# Patient Record
Sex: Male | Born: 1945 | Race: White | Hispanic: No | Marital: Married | Smoking: Heavy tobacco smoker
Health system: Southern US, Community
[De-identification: ages and names within clinical notes are randomized; demographics above are authoritative.]

## PROBLEM LIST (undated history)

## (undated) DIAGNOSIS — E785 Hyperlipidemia, unspecified: Secondary | ICD-10-CM

## (undated) DIAGNOSIS — I10 Essential (primary) hypertension: Secondary | ICD-10-CM

## (undated) DIAGNOSIS — K831 Obstruction of bile duct: Secondary | ICD-10-CM

## (undated) DIAGNOSIS — J449 Chronic obstructive pulmonary disease, unspecified: Secondary | ICD-10-CM

## (undated) DIAGNOSIS — R0600 Dyspnea, unspecified: Secondary | ICD-10-CM

## (undated) DIAGNOSIS — C801 Malignant (primary) neoplasm, unspecified: Secondary | ICD-10-CM

## (undated) HISTORY — PX: HERNIA REPAIR: SHX51

## (undated) HISTORY — PX: FOOT SURGERY: SHX648

---

## 2001-09-01 ENCOUNTER — Encounter: Admission: RE | Admit: 2001-09-01 | Discharge: 2001-09-01 | Payer: Self-pay | Admitting: Family Medicine

## 2001-09-01 ENCOUNTER — Encounter: Payer: Self-pay | Admitting: Family Medicine

## 2001-10-03 ENCOUNTER — Ambulatory Visit (HOSPITAL_COMMUNITY): Admission: RE | Admit: 2001-10-03 | Discharge: 2001-10-03 | Payer: Self-pay | Admitting: *Deleted

## 2001-11-13 ENCOUNTER — Encounter: Payer: Self-pay | Admitting: Emergency Medicine

## 2001-11-13 ENCOUNTER — Emergency Department (HOSPITAL_COMMUNITY): Admission: EM | Admit: 2001-11-13 | Discharge: 2001-11-13 | Payer: Self-pay | Admitting: *Deleted

## 2001-12-02 ENCOUNTER — Encounter: Admission: RE | Admit: 2001-12-02 | Discharge: 2001-12-02 | Payer: Self-pay | Admitting: General Surgery

## 2001-12-02 ENCOUNTER — Encounter: Payer: Self-pay | Admitting: General Surgery

## 2003-08-13 ENCOUNTER — Encounter: Admission: RE | Admit: 2003-08-13 | Discharge: 2003-08-13 | Payer: Self-pay | Admitting: Family Medicine

## 2006-09-30 ENCOUNTER — Encounter: Admission: RE | Admit: 2006-09-30 | Discharge: 2006-09-30 | Payer: Self-pay | Admitting: Family Medicine

## 2009-08-12 ENCOUNTER — Emergency Department (HOSPITAL_COMMUNITY): Admission: EM | Admit: 2009-08-12 | Discharge: 2009-08-12 | Payer: Self-pay | Admitting: Emergency Medicine

## 2010-05-22 ENCOUNTER — Encounter: Admission: RE | Admit: 2010-05-22 | Discharge: 2010-05-22 | Payer: Self-pay | Admitting: Family Medicine

## 2010-08-16 ENCOUNTER — Other Ambulatory Visit: Payer: Self-pay | Admitting: Surgery

## 2010-08-24 ENCOUNTER — Ambulatory Visit
Admission: RE | Admit: 2010-08-24 | Discharge: 2010-08-24 | Disposition: A | Discharge status: Home / Self Care | Payer: 59 | Source: Ambulatory Visit | Attending: Surgery | Admitting: Surgery

## 2010-11-22 ENCOUNTER — Other Ambulatory Visit: Payer: Self-pay | Admitting: Family Medicine

## 2010-11-22 ENCOUNTER — Ambulatory Visit
Admission: RE | Admit: 2010-11-22 | Discharge: 2010-11-22 | Disposition: A | Discharge status: Home / Self Care | Payer: 59 | Source: Ambulatory Visit | Attending: Family Medicine | Admitting: Family Medicine

## 2010-11-22 DIAGNOSIS — R630 Anorexia: Secondary | ICD-10-CM

## 2010-11-22 DIAGNOSIS — F17200 Nicotine dependence, unspecified, uncomplicated: Secondary | ICD-10-CM

## 2010-11-22 DIAGNOSIS — R634 Abnormal weight loss: Secondary | ICD-10-CM

## 2012-03-24 ENCOUNTER — Other Ambulatory Visit: Payer: Self-pay | Admitting: Family Medicine

## 2012-03-24 ENCOUNTER — Ambulatory Visit
Admission: RE | Admit: 2012-03-24 | Discharge: 2012-03-24 | Disposition: A | Discharge status: Home / Self Care | Payer: 59 | Source: Ambulatory Visit | Attending: Family Medicine | Admitting: Family Medicine

## 2012-03-24 DIAGNOSIS — E871 Hypo-osmolality and hyponatremia: Secondary | ICD-10-CM

## 2014-11-09 ENCOUNTER — Other Ambulatory Visit: Payer: Self-pay | Admitting: Acute Care

## 2014-11-09 ENCOUNTER — Telehealth: Payer: Self-pay | Admitting: Acute Care

## 2014-11-09 DIAGNOSIS — Z87891 Personal history of nicotine dependence: Secondary | ICD-10-CM

## 2014-11-09 NOTE — Telephone Encounter (Signed)
[facility] [place] [facility] [place] [facility] [place] [facility] The patient qualifies for the low dose CTprogram. I spoke with his wife as this patient still works. He is scheduled for a Shared Decision Making Visit with me on 11/16/14 at 3 pm. Mrs. Eliasen knows this building and knows where to come for the appointment . I have scheduled him for the LDCT on 11/16/14 at 4 pm. Mrs. Robb has my contact information, in the event she has any further questions.She verbalized understanding of both this appointment and the CT scan appointment both  scheduled  for 11/16/14.

## 2014-11-10 ENCOUNTER — Encounter: Payer: Self-pay | Admitting: Acute Care

## 2014-11-10 NOTE — Progress Notes (Signed)
Pre cert Authorization for the LDCT is [id number]

## 2014-11-16 ENCOUNTER — Ambulatory Visit (INDEPENDENT_AMBULATORY_CARE_PROVIDER_SITE_OTHER): Payer: 59 | Admitting: Acute Care

## 2014-11-16 ENCOUNTER — Encounter: Payer: Self-pay | Admitting: Acute Care

## 2014-11-16 ENCOUNTER — Ambulatory Visit (INDEPENDENT_AMBULATORY_CARE_PROVIDER_SITE_OTHER)
Admission: RE | Admit: 2014-11-16 | Discharge: 2014-11-16 | Disposition: A | Discharge status: Home / Self Care | Payer: 59 | Source: Ambulatory Visit | Attending: Acute Care | Admitting: Acute Care

## 2014-11-16 DIAGNOSIS — Z87891 Personal history of nicotine dependence: Secondary | ICD-10-CM | POA: Diagnosis not present

## 2014-11-16 NOTE — Progress Notes (Signed)
Shared Decision Making Visit Lung Cancer Screening Program [phone])   Eligibility:  Age 69 y.o.  Pack Years Smoking History Calculation: 50 +  (# packs/per year x # years smoked)  Recent History of coughing up blood  no  Unexplained weight loss? no ( >Than 15 pounds within the last 6 months )  Prior History Lung / other cancer no (Diagnosis within the last 5 years already requiring surveillance chest CT Scans).  Smoking Status Current Smoker  Former Smokers: Years since quit: N/A  Quit Date:N/A  Visit Components:  Discussion included one or more decision making aids. yes  Discussion included risk/benefits of screening. yes  Discussion included potential follow up diagnostic testing for abnormal scans. yes  Discussion included meaning and risk of over diagnosis. yes  Discussion included meaning and risk of False Positives. yes  Discussion included meaning of total radiation exposure. yes  Counseling Included:  Importance of adherence to annual lung cancer LDCT screening. yes  Impact of comorbidities on ability to participate in the program. yes  Ability and willingness to under diagnostic treatment. yes  Smoking Cessation Counseling:  Current Smokers:   Discussed importance of smoking cessation. yes  Information about tobacco cessation classes and interventions provided to patient. yes  Patient provided with "ticket" for LDCT Scan. yes  Symptomatic Patient. no  Counseling: N/A  Diagnosis Code: Tobacco Use Z72.0  Asymptomatic Patient yes  Counseling (Intermediate counseling: > three minutes counseling) R0076  Former Smokers:   Discussed the importance of maintaining cigarette abstinence.N/A  Diagnosis Code: Personal History of Nicotine Dependence. A26.333  Information about tobacco cessation classes and interventions provided to patient. N/A  Patient provided with "ticket" for LDCT Scan. yes  Written Order for Lung Cancer Screening with LDCT  placed in Epic. Yes (CT Chest Lung Cancer Screening Low Dose W/O CM) [id number] Z12.2-Screening of respiratory organs Z87.891-Personal history of nicotine dependence  I spent 20 minutes of face to face time with Mr. Camino and his wife discussing the risks and benefits of the lung cancer screening program. I told Mr. Kolodziejski that the single most powerful thing he could do to decrease his risk of developing lung cancer was to quit smoking. I gave him the " be stronger than your excuses" card and told him that when he is ready to quit smoking, we are here to help him achieve that goal in any way we can. He verbalized understanding. I told Mr. Seymour that I would call him within the next few days with the results of his scan. He verbalized understanding. We watched a power point, stopping to discuss questions as needed.I gave him a copy of the information to take home with him to look at as he needed. I gave him his ticket to ride the scanner, and directions to the [address] [facility]. Both he and his wife verbalized understanding of where they were going, and have my contact information if they have any further questions or concerns.  Magdalen Spatz, NP

## 2014-11-17 ENCOUNTER — Telehealth: Payer: Self-pay | Admitting: Acute Care

## 2014-11-17 NOTE — Telephone Encounter (Signed)
I called Ricky Conway to let him know that his screening CT scan showed a Lung RADS 2, nodules are of benign appearance. I told him he would need a repeat CT scan in 12 months.He verbalized understanding of both. I also told him to call me when he is ready to attempt to quit smoking, and that I will help him work toward achieving that goal. He verbalized understanding.I told him we would be in touch in early May of next year to schedule his follow up appointment.He verbalized understanding.

## 2015-09-13 ENCOUNTER — Other Ambulatory Visit: Payer: Self-pay | Admitting: Acute Care

## 2015-09-13 DIAGNOSIS — F17210 Nicotine dependence, cigarettes, uncomplicated: Secondary | ICD-10-CM

## 2015-11-17 ENCOUNTER — Ambulatory Visit
Admission: RE | Admit: 2015-11-17 | Discharge: 2015-11-17 | Disposition: A | Discharge status: Home / Self Care | Payer: 59 | Source: Ambulatory Visit | Attending: Acute Care | Admitting: Acute Care

## 2015-11-17 DIAGNOSIS — F17210 Nicotine dependence, cigarettes, uncomplicated: Secondary | ICD-10-CM

## 2015-12-01 ENCOUNTER — Telehealth: Payer: Self-pay | Admitting: Acute Care

## 2015-12-01 NOTE — Telephone Encounter (Signed)
When the staff at [facility] pulmonary called Ricky Conway to schedule his annual low-dose CT for lung cancer screening, he informed us that he has chosen topped out at the program. He did not want to schedule another CT scan. We will inform his primary care physician Dr. Willey Blade that this patient has chosen to opt out at the program so that she can continue to follow the small pulmonary nodules noted on his baseline scan as she feels is clinically indicated.

## 2015-12-29 ENCOUNTER — Other Ambulatory Visit: Payer: Self-pay | Admitting: Family Medicine

## 2015-12-29 DIAGNOSIS — R911 Solitary pulmonary nodule: Secondary | ICD-10-CM

## 2016-01-03 ENCOUNTER — Ambulatory Visit
Admission: RE | Admit: 2016-01-03 | Discharge: 2016-01-03 | Disposition: A | Discharge status: Home / Self Care | Payer: 59 | Source: Ambulatory Visit | Attending: Family Medicine | Admitting: Family Medicine

## 2016-01-03 DIAGNOSIS — R911 Solitary pulmonary nodule: Secondary | ICD-10-CM

## 2016-07-31 ENCOUNTER — Ambulatory Visit
Admission: RE | Admit: 2016-07-31 | Discharge: 2016-07-31 | Disposition: A | Discharge status: Home / Self Care | Payer: 59 | Source: Ambulatory Visit | Attending: Physician Assistant | Admitting: Physician Assistant

## 2016-07-31 ENCOUNTER — Other Ambulatory Visit: Payer: Self-pay | Admitting: Physician Assistant

## 2016-07-31 DIAGNOSIS — R05 Cough: Secondary | ICD-10-CM

## 2016-07-31 DIAGNOSIS — R1084 Generalized abdominal pain: Secondary | ICD-10-CM

## 2016-07-31 DIAGNOSIS — R059 Cough, unspecified: Secondary | ICD-10-CM

## 2016-07-31 MED ORDER — IOPAMIDOL (ISOVUE-300) INJECTION 61%
100.0000 mL | Freq: Once | INTRAVENOUS | Status: AC | PRN
  Administered 2016-07-31: 100 mL via INTRAVENOUS

## 2016-08-03 ENCOUNTER — Ambulatory Visit
Admission: RE | Admit: 2016-08-03 | Discharge: 2016-08-03 | Disposition: A | Discharge status: Home / Self Care | Payer: 59 | Source: Ambulatory Visit | Attending: Family Medicine | Admitting: Family Medicine

## 2016-08-03 ENCOUNTER — Other Ambulatory Visit: Payer: Self-pay | Admitting: Family Medicine

## 2016-08-03 DIAGNOSIS — R05 Cough: Secondary | ICD-10-CM

## 2016-08-03 DIAGNOSIS — R059 Cough, unspecified: Secondary | ICD-10-CM

## 2018-09-01 ENCOUNTER — Other Ambulatory Visit: Payer: Self-pay | Admitting: Family Medicine

## 2018-09-01 DIAGNOSIS — R945 Abnormal results of liver function studies: Principal | ICD-10-CM

## 2018-09-01 DIAGNOSIS — R7989 Other specified abnormal findings of blood chemistry: Secondary | ICD-10-CM

## 2018-09-03 ENCOUNTER — Ambulatory Visit
Admission: RE | Admit: 2018-09-03 | Discharge: 2018-09-03 | Disposition: A | Discharge status: Home / Self Care | Payer: 59 | Source: Ambulatory Visit | Attending: Family Medicine | Admitting: Family Medicine

## 2018-09-03 DIAGNOSIS — R7989 Other specified abnormal findings of blood chemistry: Secondary | ICD-10-CM

## 2018-09-03 DIAGNOSIS — R945 Abnormal results of liver function studies: Principal | ICD-10-CM

## 2018-09-08 ENCOUNTER — Emergency Department (HOSPITAL_COMMUNITY): Payer: 59

## 2018-09-08 ENCOUNTER — Other Ambulatory Visit: Payer: Self-pay

## 2018-09-08 ENCOUNTER — Encounter (HOSPITAL_COMMUNITY): Payer: Self-pay | Admitting: Internal Medicine

## 2018-09-08 ENCOUNTER — Inpatient Hospital Stay (HOSPITAL_COMMUNITY)
Admission: EM | Admit: 2018-09-08 | Discharge: 2018-09-11 | DRG: 435 | Disposition: A | Discharge status: Home / Self Care | Payer: 59 | Attending: Internal Medicine | Admitting: Internal Medicine

## 2018-09-08 DIAGNOSIS — I119 Hypertensive heart disease without heart failure: Secondary | ICD-10-CM | POA: Diagnosis present

## 2018-09-08 DIAGNOSIS — I10 Essential (primary) hypertension: Secondary | ICD-10-CM | POA: Diagnosis not present

## 2018-09-08 DIAGNOSIS — Z79899 Other long term (current) drug therapy: Secondary | ICD-10-CM | POA: Diagnosis not present

## 2018-09-08 DIAGNOSIS — F17210 Nicotine dependence, cigarettes, uncomplicated: Secondary | ICD-10-CM | POA: Diagnosis present

## 2018-09-08 DIAGNOSIS — K831 Obstruction of bile duct: Secondary | ICD-10-CM | POA: Diagnosis present

## 2018-09-08 DIAGNOSIS — J449 Chronic obstructive pulmonary disease, unspecified: Secondary | ICD-10-CM | POA: Diagnosis present

## 2018-09-08 DIAGNOSIS — C259 Malignant neoplasm of pancreas, unspecified: Principal | ICD-10-CM | POA: Diagnosis present

## 2018-09-08 DIAGNOSIS — E785 Hyperlipidemia, unspecified: Secondary | ICD-10-CM | POA: Diagnosis present

## 2018-09-08 DIAGNOSIS — E440 Moderate protein-calorie malnutrition: Secondary | ICD-10-CM | POA: Diagnosis present

## 2018-09-08 DIAGNOSIS — Z6824 Body mass index (BMI) 24.0-24.9, adult: Secondary | ICD-10-CM

## 2018-09-08 DIAGNOSIS — R945 Abnormal results of liver function studies: Secondary | ICD-10-CM

## 2018-09-08 DIAGNOSIS — R17 Unspecified jaundice: Secondary | ICD-10-CM

## 2018-09-08 DIAGNOSIS — R7989 Other specified abnormal findings of blood chemistry: Secondary | ICD-10-CM

## 2018-09-08 DIAGNOSIS — C787 Secondary malignant neoplasm of liver and intrahepatic bile duct: Secondary | ICD-10-CM | POA: Diagnosis present

## 2018-09-08 DIAGNOSIS — R160 Hepatomegaly, not elsewhere classified: Secondary | ICD-10-CM | POA: Diagnosis present

## 2018-09-08 DIAGNOSIS — R109 Unspecified abdominal pain: Secondary | ICD-10-CM | POA: Diagnosis present

## 2018-09-08 DIAGNOSIS — R140 Abdominal distension (gaseous): Secondary | ICD-10-CM

## 2018-09-08 HISTORY — DX: Obstruction of bile duct: K83.1

## 2018-09-08 HISTORY — DX: Hyperlipidemia, unspecified: E78.5

## 2018-09-08 HISTORY — DX: Essential (primary) hypertension: I10

## 2018-09-08 HISTORY — DX: Chronic obstructive pulmonary disease, unspecified: J44.9

## 2018-09-08 LAB — CBC WITH DIFFERENTIAL/PLATELET
Abs Immature Granulocytes: 0.06 10*3/uL (ref 0.00–0.07)
Basophils Absolute: 0.1 10*3/uL (ref 0.0–0.1)
Basophils Relative: 1 %
Eosinophils Absolute: 0.2 10*3/uL (ref 0.0–0.5)
Eosinophils Relative: 1 %
HCT: 54.6 % — ABNORMAL HIGH (ref 39.0–52.0)
Hemoglobin: 18.7 g/dL — ABNORMAL HIGH (ref 13.0–17.0)
Immature Granulocytes: 1 %
Lymphocytes Relative: 7 %
Lymphs Abs: 0.7 10*3/uL (ref 0.7–4.0)
MCH: 31.7 pg (ref 26.0–34.0)
MCHC: 34.2 g/dL (ref 30.0–36.0)
MCV: 92.7 fL (ref 80.0–100.0)
Monocytes Absolute: 1.2 10*3/uL — ABNORMAL HIGH (ref 0.1–1.0)
Monocytes Relative: 12 %
Neutro Abs: 8.4 10*3/uL — ABNORMAL HIGH (ref 1.7–7.7)
Neutrophils Relative %: 78 %
Platelets: 235 10*3/uL (ref 150–400)
RBC: 5.89 MIL/uL — ABNORMAL HIGH (ref 4.22–5.81)
RDW: 14.6 % (ref 11.5–15.5)
WBC: 10.6 10*3/uL — ABNORMAL HIGH (ref 4.0–10.5)
nRBC: 0 % (ref 0.0–0.2)

## 2018-09-08 LAB — PROTIME-INR
INR: 1.1 (ref 0.8–1.2)
Prothrombin Time: 14.2 seconds (ref 11.4–15.2)

## 2018-09-08 LAB — COMPREHENSIVE METABOLIC PANEL
ALT: 516 U/L — ABNORMAL HIGH (ref 0–44)
AST: 229 U/L — ABNORMAL HIGH (ref 15–41)
Albumin: 3.1 g/dL — ABNORMAL LOW (ref 3.5–5.0)
Alkaline Phosphatase: 300 U/L — ABNORMAL HIGH (ref 38–126)
Anion gap: 15 (ref 5–15)
BUN: 10 mg/dL (ref 8–23)
CO2: 21 mmol/L — ABNORMAL LOW (ref 22–32)
Calcium: 9.1 mg/dL (ref 8.9–10.3)
Chloride: 98 mmol/L (ref 98–111)
Creatinine, Ser: 0.62 mg/dL (ref 0.61–1.24)
GFR calc Af Amer: >60 mL/min (ref >60)
GFR calc non Af Amer: >60 mL/min (ref >60)
Glucose, Bld: 90 mg/dL (ref 70–99)
Potassium: 4.3 mmol/L (ref 3.5–5.1)
Sodium: 134 mmol/L — ABNORMAL LOW (ref 135–145)
Total Bilirubin: 18.3 mg/dL (ref 0.3–1.2)
Total Protein: 6.4 g/dL — ABNORMAL LOW (ref 6.5–8.1)

## 2018-09-08 LAB — AMMONIA: Ammonia: 32 umol/L (ref 9–35)

## 2018-09-08 LAB — LIPASE, BLOOD: Lipase: 28 U/L (ref 11–51)

## 2018-09-08 MED ORDER — ONDANSETRON HCL 4 MG/2ML IJ SOLN: 4.0000 mg | Freq: Four times a day (QID) | INTRAMUSCULAR | Status: DC | PRN

## 2018-09-08 MED ORDER — ZOLPIDEM TARTRATE 5 MG PO TABS
5.0000 mg | ORAL_TABLET | Freq: Every evening | ORAL | Status: DC | PRN
  Administered 2018-09-08 – 2018-09-09 (×2): 5 mg via ORAL
  Filled 2018-09-08 (×2): qty 1

## 2018-09-08 MED ORDER — ENSURE ENLIVE PO LIQD
237.0000 mL | Freq: Two times a day (BID) | ORAL | Status: DC
  Administered 2018-09-10 – 2018-09-11 (×3): 237 mL via ORAL

## 2018-09-08 MED ORDER — HEPARIN SODIUM (PORCINE) 5000 UNIT/ML IJ SOLN
5000.0000 [IU] | Freq: Three times a day (TID) | INTRAMUSCULAR | Status: DC
  Administered 2018-09-08 – 2018-09-10 (×3): 5000 [IU] via SUBCUTANEOUS
  Filled 2018-09-08 (×3): qty 1

## 2018-09-08 MED ORDER — ACETAMINOPHEN 325 MG PO TABS
650.0000 mg | ORAL_TABLET | Freq: Four times a day (QID) | ORAL | Status: DC | PRN
  Administered 2018-09-10: 650 mg via ORAL
  Filled 2018-09-08: qty 2

## 2018-09-08 MED ORDER — HEPARIN SODIUM (PORCINE) 5000 UNIT/ML IJ SOLN: 5000.0000 [IU] | Freq: Three times a day (TID) | INTRAMUSCULAR | Status: DC

## 2018-09-08 MED ORDER — GADOBUTROL 1 MMOL/ML IV SOLN
8.0000 mL | Freq: Once | INTRAVENOUS | Status: AC | PRN
  Administered 2018-09-08: 8 mL via INTRAVENOUS

## 2018-09-08 MED ORDER — ONDANSETRON HCL 4 MG PO TABS: 4.0000 mg | ORAL_TABLET | Freq: Four times a day (QID) | ORAL | Status: DC | PRN

## 2018-09-08 MED ORDER — POTASSIUM CHLORIDE IN NACL 20-0.9 MEQ/L-% IV SOLN
INTRAVENOUS | Status: AC
  Administered 2018-09-08 – 2018-09-10 (×3): via INTRAVENOUS
  Filled 2018-09-08 (×6): qty 1000

## 2018-09-08 MED ORDER — SODIUM CHLORIDE 0.9 % IV BOLUS
500.0000 mL | Freq: Once | INTRAVENOUS | Status: AC
  Administered 2018-09-08: 500 mL via INTRAVENOUS

## 2018-09-08 MED ORDER — OXYCODONE HCL 5 MG PO TABS
5.0000 mg | ORAL_TABLET | ORAL | Status: DC | PRN
  Administered 2018-09-09 – 2018-09-10 (×3): 5 mg via ORAL
  Filled 2018-09-08 (×4): qty 1

## 2018-09-08 MED ORDER — ACETAMINOPHEN 650 MG RE SUPP: 650.0000 mg | Freq: Four times a day (QID) | RECTAL | Status: DC | PRN

## 2018-09-08 MED ORDER — SODIUM CHLORIDE 0.9% FLUSH
3.0000 mL | Freq: Two times a day (BID) | INTRAVENOUS | Status: DC
  Administered 2018-09-08 – 2018-09-11 (×5): 3 mL via INTRAVENOUS

## 2018-09-08 NOTE — Progress Notes (Signed)
New Admission Note:   Arrival Method: Stretcher from ED Mental Orientation: alert and oriented x4  Telemetry: NA Assessment: Completed Skin: Intact IV: RAC  Pain: 0 (0-10) Tubes: None Safety Measures: Safety Fall Prevention Plan has been discussed  [address] [place] Orientation: Patient has been orientated to the room, unit and staff.   Family: at bedside   Orders to be reviewed and implemented. Will continue to monitor the patient. Call light has been placed within reach and bed alarm has been activated.   Baldo Ash, RN

## 2018-09-08 NOTE — ED Notes (Signed)
Patient transported to MRI 

## 2018-09-08 NOTE — Consult Note (Signed)
[facility] Consult  Referring Provider: Lady Deutscher, MD Primary Care Physician:  Mayra Neer, MD Primary Gastroenterologist: [facility]  Reason for Consultation: Obstructive jaundice  HPI: Ricky Conway is a 73 y.o. male was sent to ER by his primary care physician with abnormal ultrasound and labs. Within the last 1 month patient has had episodes of constipation, pale-colored stool and yellowish discoloration of his eyes and skin.  He has lost about 10 pounds in the last 1 month, denies abdominal pain or fever.  He is a smoker. He was found to have a bilirubin of 18.3, ALP of 300, AST 229, ALT 516 on admission in the ER. MRI/MRCP showed multifocal hepatic lesions/metastases at least 6 in number, largest measuring 3.5 cm, intrahepatic and extrahepatic ductal dilatation, abrupt narrowing at level of mid/distal common duct suggesting extrinsic compression/tumor involvement. 1.9 x 2.6 cm hypodense lesion arising from pancreatic head suspicious for pancreatic adenocarcinoma. Small upper abdominal and retroperitoneal lymph nodes noted. Colonoscopy from 2016 performed for history of polyps was reported as normal, repeat recommended in 5 years.  Past Medical History:  Diagnosis Date  . COPD (chronic obstructive pulmonary disease) ([place])   . Hyperlipidemia   . Hypertension     History reviewed. No pertinent surgical history.  Prior to Admission medications   Not on File    Current Facility-Administered Medications  Medication Dose Route Frequency Provider Last Rate Last Dose  . 0.9 % NaCl with KCl 20 mEq/ L  infusion   Intravenous Continuous Lady Deutscher, MD      . acetaminophen (TYLENOL) tablet 650 mg  650 mg Oral Q6H PRN Lady Deutscher, MD       Or  . acetaminophen (TYLENOL) suppository 650 mg  650 mg Rectal Q6H PRN Lady Deutscher, MD      . heparin injection 5,000 Units  5,000 Units Subcutaneous Q8H Lady Deutscher, MD      . ondansetron [facility])  tablet 4 mg  4 mg Oral Q6H PRN Lady Deutscher, MD       Or  . ondansetron [facility]) injection 4 mg  4 mg Intravenous Q6H PRN Lady Deutscher, MD      . oxyCODONE (Oxy IR/ROXICODONE) immediate release tablet 5 mg  5 mg Oral Q4H PRN Lady Deutscher, MD      . sodium chloride flush (NS) 0.9 % injection 3 mL  3 mL Intravenous Q12H Lady Deutscher, MD      . zolpidem (AMBIEN) tablet 5 mg  5 mg Oral QHS PRN Lady Deutscher, MD        Allergies as of 09/08/2018  . (No Known Allergies)    Family History  Problem Relation Age of Onset  . Diabetes Neg Hx   . CAD Neg Hx   . Cancer Neg Hx     Social History   Socioeconomic History  . Marital status: Married    Spouse name: Not on file  . Number of children: Not on file  . Years of education: Not on file  . Highest education level: Not on file  Occupational History  . Not on file  Social Needs  . Financial resource strain: Not on file  . Food insecurity:    Worry: Not on file    Inability: Not on file  . Transportation needs:    Medical: Not on file    Non-medical: Not on file  Tobacco Use  . Smoking status: Heavy Tobacco Smoker  Packs/day: 1.00    Years: 50.00    Pack years: 50.00    Types: Cigarettes  . Smokeless tobacco: Never Used  . Tobacco comment: Not currently ready to quit  Substance and Sexual Activity  . Alcohol use: Not on file  . Drug use: Not on file  . Sexual activity: Not on file  Lifestyle  . Physical activity:    Days per week: Not on file    Minutes per session: Not on file  . Stress: Not on file  Relationships  . Social connections:    Talks on phone: Not on file    Gets together: Not on file    Attends religious service: Not on file    Active member of club or organization: Not on file    Attends meetings of clubs or organizations: Not on file    Relationship status: Not on file  . Intimate partner violence:    Fear of current or ex partner: Not on file    Emotionally abused:  Not on file    Physically abused: Not on file    Forced sexual activity: Not on file  Other Topics Concern  . Not on file  Social History Narrative  . Not on file    Review of Systems:  GI: Described in detail in HPI.    Gen: Denies any fever, chills, rigors, night sweats, anorexia, fatigue, weakness, malaise, involuntary weight loss, and sleep disorder CV: Denies chest pain, angina, palpitations, syncope, orthopnea, PND, peripheral edema, and claudication. Resp: Denies dyspnea, cough, sputum, wheezing, coughing up blood. GU : Denies urinary burning, blood in urine, urinary frequency, urinary hesitancy, nocturnal urination, and urinary incontinence. MS: Denies joint pain or swelling.  Denies muscle weakness, cramps, atrophy.  Derm: Denies rash, itching, oral ulcerations, hives, unhealing ulcers.  Psych: Denies depression, anxiety, memory loss, suicidal ideation, hallucinations,  and confusion. Heme: Denies bruising, bleeding, and enlarged lymph nodes. Neuro:  Denies any headaches, dizziness, paresthesias. Endo:  Denies any problems with DM, thyroid, adrenal function.  Physical Exam: Vital signs in last 24 hours: Temp:  [97.6 F (36.4 C)-98.1 F (36.7 C)] 98.1 F (36.7 C) (03/16 1604) Pulse Rate:  [68-79] 79 (03/16 1604) Resp:  [14-19] 18 (03/16 1604) BP: (129-161)/(63-87) 161/76 (03/16 1604) SpO2:  [87 %-92 %] 90 % (03/16 1604) Last BM Date: 09/08/18  General:   Alert,  Well-developed, well-nourished, pleasant and cooperative in NAD Head:  Normocephalic and atraumatic. Eyes:  Sclera clear, deep icterus.   Conjunctiva pink. Ears:  Normal auditory acuity. Nose:  No deformity, discharge,  or lesions. Mouth:  No deformity or lesions.  Oropharynx pink & moist. Neck:  Supple; no masses or thyromegaly. Lungs:  Clear throughout to auscultation.   No wheezes, crackles, or rhonchi. No acute distress. Heart:  Regular rate and rhythm; no murmurs, clicks, rubs,  or gallops. Extremities:   Without clubbing or edema. Neurologic:  Alert and  oriented x4;  grossly normal neurologically. Skin:  Intact without significant lesions or rashes. Psych:  Alert and cooperative. Normal mood and affect. Abdomen:  Soft, nontender and nondistended. No masses, hepatosplenomegaly or hernias noted. Normal bowel sounds, without guarding, and without rebound.         Lab Results: Recent Labs    09/08/18 1230  WBC 10.6*  HGB 18.7*  HCT 54.6*  PLT 235   BMET Recent Labs    09/08/18 1230  NA 134*  K 4.3  CL 98  CO2 21*  GLUCOSE 90  BUN 10  CREATININE 0.62  CALCIUM 9.1   LFT Recent Labs    09/08/18 1230  PROT 6.4*  ALBUMIN 3.1*  AST 229*  ALT 516*  ALKPHOS 300*  BILITOT 18.3*   PT/INR Recent Labs    09/08/18 1230  LABPROT 14.2  INR 1.1    Studies/Results: Dg Chest 2 View  Result Date: 09/08/2018 CLINICAL DATA:  Jaundice with biliary obstruction. Symptoms for 1 months. EXAM: CHEST - 2 VIEW COMPARISON:  None. FINDINGS: Cardiomegaly. Thoracic atherosclerosis. Hyperinflation with increased markings suggesting COPD. No consolidation or edema. No effusion or pneumothorax. Bones unremarkable. IMPRESSION: No active cardiopulmonary disease. Electronically Signed   By: Staci Righter M.D.   On: 09/08/2018 15:00   Mr Abdomen Mrcp W Wo Contast  Result Date: 09/08/2018 CLINICAL DATA:  Abnormal LFTs, liver lesions ultrasound EXAM: MRI ABDOMEN WITHOUT AND WITH CONTRAST (INCLUDING MRCP) TECHNIQUE: Multiplanar multisequence MR imaging of the abdomen was performed both before and after the administration of intravenous contrast. Heavily T2-weighted images of the biliary and pancreatic ducts were obtained, and three-dimensional MRCP images were rendered by post processing. CONTRAST:  8 mL Gadovist IV COMPARISON:  Right upper quadrant ultrasound dated 09/03/2018. CT abdomen/pelvis dated 07/31/2016. FINDINGS: Motion degraded images, particularly dynamic postcontrast imaging, which is  severely degraded. Lower chest: Minimal dependent atelectasis lung bases. Hepatobiliary: Multifocal hepatic lesions/metastases, at least 6 in number, including: --2.7 cm lesion in segment 2 (series 5/image 14) --2.7 cm lesion in segment 7 (series 5/image 14) --Two lesions in segment 4A measuring 2.8 and 3.5 cm (series 5/image 15) --2.5 cm lesion in segment 6 (series 5/image 35) Distended gallbladder with layering tiny gallstones and gallbladder sludge (series 5/image 36). Intrahepatic and extrahepatic ductal dilatation. Central common duct measures 18 mm. Abrupt narrowing at the level of the mid/distal common duct (series 4/image 31), suggesting extrinsic compression/tumor involvement. Pancreas: 1.9 x 2.6 cm hypoenhancing lesion arising exophytically along the posterior aspect of the pancreatic head (series 1301/image 42), suspicious for pancreatic adenocarcinoma. No pancreatic atrophy or ductal dilatation. Spleen:  Within normal limits. Adrenals/Urinary Tract:  Adrenal glands are within normal limits. Tiny bilateral renal cysts.  No hydronephrosis. Stomach/Bowel: Stomach is within normal limits. Visualized bowel is unremarkable. Vascular/Lymphatic: No evidence of abdominal aortic aneurysm. Suspected lesion abuts the IVC. Small upper abdominal/retroperitoneal lymph nodes, including a 2.4 cm short axis left para-aortic node (series 3/image 28), poorly visualized. Other:  No abdominal ascites. Musculoskeletal: No focal osseous lesions. IMPRESSION: Limited evaluation due to motion degraded imaging. 1.9 x 2.6 cm hyperenhancing lesion arising along the posterior aspect of the pancreatic head, suspicious for pancreatic adenocarcinoma. Suspected involvement of the distal common duct, with secondary intrahepatic/extrahepatic ductal dilatation. Lesion also abuts the IVC. Multifocal hepatic metastases, with index lesions as above. Small upper abdominal/retroperitoneal lymph nodes, poorly evaluated. Electronically Signed   By:  Julian Hy M.D.   On: 09/08/2018 15:41    Impression: Pancreatic cancer with distal bile duct obstruction and liver masses suspicious for metastases  Plan: ERCP with stent placement for relief of biliary obstruction, bile duct brushing AFP, CEA, CA-19-9   LOS: 0 days   Ronnette Juniper, MD  09/08/2018, 4:43 PM  Pager [phone] If no answer or after 5 PM call [phone]

## 2018-09-08 NOTE — ED Provider Notes (Signed)
[facility]ENC[facility] DEPARTMENT Provider Note   CSN: [id number] Arrival date & time: 09/08/18  1050    History   Chief Complaint Chief Complaint  Patient presents with   Abdominal Pain    HPI Ricky Conway Yaeger is a 73 y.o. male.     HPI  Patient is a 73 year old male with a history of hypertension, hyperlipidemia, extensive smoking history, but denies confirm diagnosis of COPD, presenting for abnormal right upper quadrant ultrasound per primary care provider.  Patient followed by Dr. Brigitte Pulse.  Patient reports he received a call from her today after he had a right upper quadrant ultrasound performed on 09-02-2018.  He reports that it showed "an obstruction" and he was instructed, the emergency department.  Patient reports an insidious onset of generalized abdominal pain and discomfort, dyspepsia, intermittent constipation and diarrhea, as well as jaundice, dark urine and pale stools.  Last bowel movement was this morning.  Patient is consistently passing flatus.  Patient reports this is been hurting for greater than 6 weeks.  Patient reports that he will have daily, vague abdominal pain from "the stomach down".  He reports that anytime he ate something fatty it will "sit in his stomach" and be undigested.  Denies any fever or chills over the past several weeks.  He presented last week to his primary care provider for a routine physical, and at that time she ordered a right upper quadrant ultrasound.  He denies any history of liver disease.  He reports he is a current every day smoker and he "smokes too much."   No past medical history on file.  There are no active problems to display for this patient.      Home Medications    Prior to Admission medications   Not on File    Family History No family history on file.  Social History Social History   Tobacco Use   Smoking status: Current Every Day Smoker    Packs/day: 1.00    Years: 50.00    Pack years: 50.00      Types: Cigarettes   Tobacco comment: Not currently ready to quit  Substance Use Topics   Alcohol use: Not on file   Drug use: Not on file     Allergies   Patient has no known allergies.   Review of Systems Review of Systems  Constitutional: Negative for chills and fever.  HENT: Negative for congestion, rhinorrhea, sinus pain and sore throat.   Eyes: Negative for visual disturbance.  Respiratory: Positive for shortness of breath. Negative for cough and chest tightness.        (Pt reports baseline shortness of breath)  Cardiovascular: Negative for chest pain, palpitations and leg swelling.  Gastrointestinal: Positive for abdominal pain, constipation and diarrhea. Negative for nausea and vomiting.  Genitourinary: Negative for dysuria, flank pain and hematuria.  Musculoskeletal: Negative for back pain and myalgias.  Skin: Negative for rash.  Neurological: Negative for dizziness, syncope, light-headedness and headaches.     Physical Exam Updated Vital Signs There were no vitals taken for this visit.  Physical Exam Vitals signs and nursing note reviewed.  Constitutional:      General: He is not in acute distress.    Appearance: He is well-developed.     Comments: Patient alert, conversive, and in no acute distress.  He does exhibit diffuse jaundice.  HENT:     Head: Normocephalic and atraumatic.  Eyes:     General: Scleral icterus present.  Extraocular Movements: Extraocular movements intact.     Conjunctiva/sclera: Conjunctivae normal.     Pupils: Pupils are equal, round, and reactive to light.  Neck:     Musculoskeletal: Normal range of motion and neck supple.  Cardiovascular:     Rate and Rhythm: Normal rate and regular rhythm.     Heart sounds: Normal heart sounds, S1 normal and S2 normal. No murmur.  Pulmonary:     Effort: Pulmonary effort is normal.     Breath sounds: Normal breath sounds. No wheezing, rhonchi or rales.  Abdominal:     General: Abdomen  is protuberant. There is no distension.     Palpations: Abdomen is soft.     Tenderness: There is abdominal tenderness in the epigastric area. There is no guarding.     Comments: Mild epigastric tenderness but no guarding or rebound.  Musculoskeletal: Normal range of motion.        General: No deformity.  Lymphadenopathy:     Cervical: No cervical adenopathy.  Skin:    General: Skin is warm and dry.     Coloration: Skin is jaundiced.     Findings: No erythema or rash.  Neurological:     Mental Status: He is alert.     Comments: Cranial nerves grossly intact. Patient moves extremities symmetrically and with good coordination.  Psychiatric:        Behavior: Behavior normal.        Thought Content: Thought content normal.        Judgment: Judgment normal.      ED Treatments / Results  Labs (all labs ordered are listed, but only abnormal results are displayed) Labs Reviewed  CBC WITH DIFFERENTIAL/PLATELET - Abnormal; Notable for the following components:      Result Value   WBC 10.6 (*)    RBC 5.89 (*)    Hemoglobin 18.7 (*)    HCT 54.6 (*)    Neutro Abs 8.4 (*)    Monocytes Absolute 1.2 (*)    All other components within normal limits  COMPREHENSIVE METABOLIC PANEL - Abnormal; Notable for the following components:   Sodium 134 (*)    CO2 21 (*)    Total Protein 6.4 (*)    Albumin 3.1 (*)    AST 229 (*)    ALT 516 (*)    Alkaline Phosphatase 300 (*)    Total Bilirubin 18.3 (*)    All other components within normal limits  PROTIME-INR  AMMONIA  LIPASE, BLOOD  HEPATITIS PANEL, ACUTE    EKG None  Radiology Dg Chest 2 View  Result Date: 09/08/2018 CLINICAL DATA:  Jaundice with biliary obstruction. Symptoms for 1 months. EXAM: CHEST - 2 VIEW COMPARISON:  None. FINDINGS: Cardiomegaly. Thoracic atherosclerosis. Hyperinflation with increased markings suggesting COPD. No consolidation or edema. No effusion or pneumothorax. Bones unremarkable. IMPRESSION: No active  cardiopulmonary disease. Electronically Signed   By: Staci Righter M.D.   On: 09/08/2018 15:00   Mr Abdomen Mrcp W Wo Contast  Result Date: 09/08/2018 CLINICAL DATA:  Abnormal LFTs, liver lesions ultrasound EXAM: MRI ABDOMEN WITHOUT AND WITH CONTRAST (INCLUDING MRCP) TECHNIQUE: Multiplanar multisequence MR imaging of the abdomen was performed both before and after the administration of intravenous contrast. Heavily T2-weighted images of the biliary and pancreatic ducts were obtained, and three-dimensional MRCP images were rendered by post processing. CONTRAST:  8 mL Gadovist IV COMPARISON:  Right upper quadrant ultrasound dated 09/03/2018. CT abdomen/pelvis dated 07/31/2016. FINDINGS: Motion degraded images, particularly dynamic postcontrast imaging,  which is severely degraded. Lower chest: Minimal dependent atelectasis lung bases. Hepatobiliary: Multifocal hepatic lesions/metastases, at least 6 in number, including: --2.7 cm lesion in segment 2 (series 5/image 14) --2.7 cm lesion in segment 7 (series 5/image 14) --Two lesions in segment 4A measuring 2.8 and 3.5 cm (series 5/image 15) --2.5 cm lesion in segment 6 (series 5/image 35) Distended gallbladder with layering tiny gallstones and gallbladder sludge (series 5/image 36). Intrahepatic and extrahepatic ductal dilatation. Central common duct measures 18 mm. Abrupt narrowing at the level of the mid/distal common duct (series 4/image 31), suggesting extrinsic compression/tumor involvement. Pancreas: 1.9 x 2.6 cm hypoenhancing lesion arising exophytically along the posterior aspect of the pancreatic head (series 1301/image 42), suspicious for pancreatic adenocarcinoma. No pancreatic atrophy or ductal dilatation. Spleen:  Within normal limits. Adrenals/Urinary Tract:  Adrenal glands are within normal limits. Tiny bilateral renal cysts.  No hydronephrosis. Stomach/Bowel: Stomach is within normal limits. Visualized bowel is unremarkable. Vascular/Lymphatic: No  evidence of abdominal aortic aneurysm. Suspected lesion abuts the IVC. Small upper abdominal/retroperitoneal lymph nodes, including a 2.4 cm short axis left para-aortic node (series 3/image 28), poorly visualized. Other:  No abdominal ascites. Musculoskeletal: No focal osseous lesions. IMPRESSION: Limited evaluation due to motion degraded imaging. 1.9 x 2.6 cm hyperenhancing lesion arising along the posterior aspect of the pancreatic head, suspicious for pancreatic adenocarcinoma. Suspected involvement of the distal common duct, with secondary intrahepatic/extrahepatic ductal dilatation. Lesion also abuts the IVC. Multifocal hepatic metastases, with index lesions as above. Small upper abdominal/retroperitoneal lymph nodes, poorly evaluated. Electronically Signed   By: Julian Hy M.D.   On: 09/08/2018 15:41    Procedures Procedures (including critical care time)  Medications Ordered in ED Medications - No data to display   Initial Impression / Assessment and Plan / ED Course  I have reviewed the triage vital signs and the nursing notes.  Pertinent labs & imaging results that were available during my care of the patient were reviewed by me and considered in my medical decision making (see chart for details).  Clinical Course as of Sep 07 1456  Mon Sep 08, 2018  1312 Pt reports "my oxygen always runs this low". No wheezing on exam. CXR pending.   SpO2(!): 88 % [AM]  1315 Hemoglobin(!): 18.7 [AM]  1610 Spoke with Dr. Therisa Doyne of GI who will follow patient. Appreciate her involvement in the care of this patient.    [AM]    Clinical Course User Index [AM] Albesa Seen, PA-C       Patient is nontoxic-appearing, comfortable, and in no acute distress.  Patient presents with insidious onset of abdominal pain as well as diffuse jaundice on exam today.  He had an abnormal right upper quadrant ultrasound with concerning mass at the ampulla.  At this time, differential diagnosis includes  pancreatic cancer, liver cancer, biliary cancer versus other mass.  He is not having any fevers, significant right upper quadrant pain, or acute illness to suggest acute cholangitis.  No known exposure to hepatitis A.  Per the recommendations of right upper quadrant ultrasound, will proceed with MRI abdomen, MRCP.  Awaiting labs, and then will admit for work-up of jaundice. MRI/MRCP pending.   Patient has a bilirubin of 18.  3.  AST 29, ALT 516, alkaline phosphatase 300.  Suggestive of biliary ductal obstruction.  No significant leukocytosis.  Hemoglobin of 18.7, unclear chronicity, may be due to patient's extensive smoking history.  Patient did have oxygen saturation of 88%, which he reports is chronic,  and is not short of breath.  Chest x-ray without any evidence of cardiopulmonary disease.  Do not feel that patient has acute pulmonary pathology at this time. Suspect secondary to COPD.   Patient admitted per Dr. Evangeline Gula of hospital medicine for further workup.  GI consulted and will follow.  Appreciate their involvement period.   Final Clinical Impressions(s) / ED Diagnoses   Final diagnoses:  Jaundice  LFTs abnormal    ED Discharge Orders    None       Tamala Julian 09/08/18 1611    Valarie Merino, MD 09/10/18 1504

## 2018-09-08 NOTE — ED Notes (Addendum)
ED TO INPATIENT HANDOFF REPORT  ED Nurse Name and Phone #:  Lonn Georgia [phone]  S Name/Age/Gender Ricky Conway 73 y.o. male Room/Bed: 046C/046C  Code Status   Code Status: Full Code  Home/SNF/Other Home Patient oriented to: self, place, time and situation Is this baseline? Yes   Triage Complete: Triage complete  Chief Complaint abd pain/sent by dr  Triage Note Patient sent by doctor for possible bowel blockage - patient has had lower abd pain x 2 weeks, progressively getting worse with N/V and constipation (last BM this morning, but patient reports difficulty). Denies fevers/chills. U/S done at [facility] office - faxed to ED.   Allergies No Known Allergies  Level of Care/Admitting Diagnosis ED Disposition    ED Disposition Condition [facility] Area: [facility] [[id number]]  Level of Care: Med-Surg [16]  Diagnosis: Common biliary duct obstruction [102725]  Admitting Physician: Lady Deutscher [[id number]]  Attending Physician: Lady Deutscher [[id number]]  Estimated length of stay: 3 - 4 days  Certification:: I certify this patient will need inpatient services for at least 2 midnights  PT Class (Do Not Modify): Inpatient [101]  PT Acc Code (Do Not Modify): Private [1]       B Medical/Surgery History Past Medical History:  Diagnosis Date  . COPD (chronic obstructive pulmonary disease) ([place])   . Hyperlipidemia   . Hypertension    History reviewed. No pertinent surgical history.   A IV Location/Drains/Wounds Patient Lines/Drains/Airways Status   Active Line/Drains/Airways    Name:   Placement date:   Placement time:   Site:   Days:   Peripheral IV 09/08/18 Right Antecubital   09/08/18    1232    Antecubital   less than 1          Intake/Output Last 24 hours No intake or output data in the 24 hours ending 09/08/18 1534  Labs/Imaging Results for orders placed or performed during the hospital encounter of 09/08/18 (from the past 48  hour(s))  CBC with Differential     Status: Abnormal   Collection Time: 09/08/18 12:30 PM  Result Value Ref Range   WBC 10.6 (H) 4.0 - 10.5 K/uL   RBC 5.89 (H) 4.22 - 5.81 MIL/uL   Hemoglobin 18.7 (H) 13.0 - 17.0 g/dL   HCT 54.6 (H) 39.0 - 52.0 %   MCV 92.7 80.0 - 100.0 fL   MCH 31.7 26.0 - 34.0 pg   MCHC 34.2 30.0 - 36.0 g/dL   RDW 14.6 11.5 - 15.5 %   Platelets 235 150 - 400 K/uL   nRBC 0.0 0.0 - 0.2 %   Neutrophils Relative % 78 %   Neutro Abs 8.4 (H) 1.7 - 7.7 K/uL   Lymphocytes Relative 7 %   Lymphs Abs 0.7 0.7 - 4.0 K/uL   Monocytes Relative 12 %   Monocytes Absolute 1.2 (H) 0.1 - 1.0 K/uL   Eosinophils Relative 1 %   Eosinophils Absolute 0.2 0.0 - 0.5 K/uL   Basophils Relative 1 %   Basophils Absolute 0.1 0.0 - 0.1 K/uL   Immature Granulocytes 1 %   Abs Immature Granulocytes 0.06 0.00 - 0.07 K/uL    Comment: Performed at [facility], [address] [place], [place] [address]  Comprehensive metabolic panel     Status: Abnormal   Collection Time: 09/08/18 12:30 PM  Result Value Ref Range   Sodium 134 (L) 135 - 145 mmol/L   Potassium 4.3 3.5 -  5.1 mmol/L   Chloride 98 98 - 111 mmol/L   CO2 21 (L) 22 - 32 mmol/L   Glucose, Bld 90 70 - 99 mg/dL   BUN 10 8 - 23 mg/dL   Creatinine, Ser 0.62 0.61 - 1.24 mg/dL   Calcium 9.1 8.9 - 10.3 mg/dL   Total Protein 6.4 (L) 6.5 - 8.1 g/dL   Albumin 3.1 (L) 3.5 - 5.0 g/dL   AST 229 (H) 15 - 41 U/L   ALT 516 (H) 0 - 44 U/L   Alkaline Phosphatase 300 (H) 38 - 126 U/L   Total Bilirubin 18.3 (HH) 0.3 - 1.2 mg/dL    Comment: CRITICAL RESULT CALLED TO, READ BACK BY AND VERIFIED WITH: ALLISA MURRAY PA 1345 [id number] BY S. CLARK    GFR calc non Af Amer >60 >60 mL/min   GFR calc Af Amer >60 >60 mL/min   Anion gap 15 5 - 15    Comment: Performed at [facility], [address], [place], [place] [address]  Protime-INR     Status: None   Collection Time: 09/08/18 12:30 PM  Result Value Ref Range   Prothrombin Time 14.2 11.4  - 15.2 seconds   INR 1.1 0.8 - 1.2    Comment: (NOTE) INR goal varies based on device and disease states. Performed at [facility], [place] [address], [place], [place] [address]   Ammonia     Status: None   Collection Time: 09/08/18 12:30 PM  Result Value Ref Range   Ammonia 32 9 - 35 umol/L    Comment: Performed at [facility], [place] [address], [place], [place] [address]  Lipase, blood     Status: None   Collection Time: 09/08/18 12:30 PM  Result Value Ref Range   Lipase 28 11 - 51 U/L    Comment: Performed at [facility] [address] [place], [place] [address]   Dg Chest 2 View  Result Date: 09/08/2018 CLINICAL DATA:  Jaundice with biliary obstruction. Symptoms for 1 months. EXAM: CHEST - 2 VIEW COMPARISON:  None. FINDINGS: Cardiomegaly. Thoracic atherosclerosis. Hyperinflation with increased markings suggesting COPD. No consolidation or edema. No effusion or pneumothorax. Bones unremarkable. IMPRESSION: No active cardiopulmonary disease. Electronically Signed   By: Staci Righter M.D.   On: 09/08/2018 15:00    Pending Labs Unresulted Labs (From admission, onward)    Start     Ordered   09/09/18 0500  Comprehensive metabolic panel  Tomorrow morning,   R     09/08/18 1504   09/09/18 0500  CBC  Tomorrow morning,   R     09/08/18 1504   09/08/18 1146  Hepatitis panel, acute  ONCE - STAT,   STAT     09/08/18 1145          Vitals/Pain Today's Vitals   09/08/18 1300 09/08/18 1310 09/08/18 1315 09/08/18 1530  BP: 138/87  129/69 (!) 151/77  Pulse: 68  69 73  Resp:    19  Temp:  97.9 F (36.6 C)    TempSrc:  Oral    SpO2: (!) 88%  90% 90%  PainSc:        Isolation Precautions No active isolations  Medications Medications  heparin injection 5,000 Units (has no administration in time range)  sodium chloride flush (NS) 0.9 % injection 3 mL (has no administration in time range)  0.9 % NaCl with KCl 20 mEq/ L  infusion (has no administration  in time  range)  acetaminophen (TYLENOL) tablet 650 mg (has no administration in time range)    Or  acetaminophen (TYLENOL) suppository 650 mg (has no administration in time range)  oxyCODONE (Oxy IR/ROXICODONE) immediate release tablet 5 mg (has no administration in time range)  zolpidem (AMBIEN) tablet 5 mg (has no administration in time range)  ondansetron (ZOFRAN) tablet 4 mg (has no administration in time range)    Or  ondansetron (ZOFRAN) injection 4 mg (has no administration in time range)  sodium chloride 0.9 % bolus 500 mL (500 mLs Intravenous New Bag/Given 09/08/18 1515)  gadobutrol (GADAVIST) 1 MMOL/ML injection 8 mL (8 mLs Intravenous Contrast Given 09/08/18 1446)    Mobility walks Low fall risk   Focused Assessments  R Recommendations: See Admitting Provider Note  Report given to:   Additional Notes:

## 2018-09-08 NOTE — ED Notes (Addendum)
Pt placed on 1L [place] for O2 sats of 87%, pt states his O2 hangs out in the upper 80's.

## 2018-09-08 NOTE — H&P (Signed)
History and Physical    Ricky Conway CWC:[id number] DOB: 1945/07/04 DOA: 09/08/2018  PCP: Mayra Neer, MD  Patient coming from: Home  I have personally briefly reviewed patient's old medical records in [place]  Chief Complaint: Sent in from primary care doctor due to abnormal ultrasound of the abdomen  HPI: Ricky Conway is a 73 y.o. male with medical history significant for a paucity of medical problems and on no medications.  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  He presented to his primary care doctor recently and was found to have elevated LFTs.  An ultrasound of the abdomen was obtained and he was found to have 3 lesions in the liver that were highly suspicious for carcinoma and intra-and extrahepatic biliary ductal dilatation.  A cause for biliary obstruction was not identified but a constellation of findings raise the possibility of a pancreatic or ampullary mass.  MRCP was recommended.  Patient reports an insidious onset of generalized abdominal pain and discomfort associated with dyspepsia, intermittent constipation and diarrhea as well as jaundice, dark urine and pale stools.  His last bowel movement was this morning.  He is constantly passing flatus.  The patient reports that he has been hurting for greater than 6 weeks.  He has daily vague abdominal pain from the stomach down.  He reports that anytime he eats something fatty it will sit his stomach and be undigested.  He has had no fevers or chills over the past several weeks.  No prior history of liver disease he reports that he is a current every day smoker and that he smokes too much.  States he checks his pulse ox at home and routinely runs 88%.  His wife has a history of colon cancer with mets to the liver from best I can discern.   ED Course: Labs abnormal MRCP ordered bilirubin 18.3 patient mildly hypoxic in the  emergency department.  Review of Systems: As per HPI otherwise all other systems reviewed and  negative.  Past Medical History:  Diagnosis Date  . COPD (chronic obstructive pulmonary disease) ([employer])   . Hyperlipidemia   . Hypertension     History reviewed. No pertinent surgical history.  Social History   Social History Narrative  . Not on file     reports that he has been smoking cigarettes. He has a 50.00 pack-year smoking history. He has never used smokeless tobacco. No history on file for alcohol and drug.  No Known Allergies  Family History  Problem Relation Age of Onset  . Diabetes Neg Hx   . CAD Neg Hx   . Cancer Neg Hx     Prior to Admission medications   Not on File  No medications prior to admission    Physical Exam:  Constitutional: Jaundiced NAD, calm, comfortable  Vitals:   09/08/18 1230 09/08/18 1300 09/08/18 1310 09/08/18 1315  BP: 139/80 138/87  129/69  Pulse: 70 68  69  Resp: 16     Temp:   97.9 F (36.6 C)   TempSrc:   Oral   SpO2: (!) 87% (!) 88%  90%   Eyes: Sclera are icteric PERRL, lids and conjunctivae normal ENMT: Mucous membranes are dry. Posterior pharynx clear of any exudate or lesions.Normal dentition.  Neck: normal, supple, no masses, no thyromegaly Respiratory: Poor air movement, clear to auscultation bilaterally, no wheezing, no crackles. Normal respiratory effort. No  accessory muscle use.  Cardiovascular: Regular rate and rhythm, no murmurs / rubs / gallops. No extremity edema. 2+ pedal pulses. No carotid bruits.  Abdomen: Mild epigastric tenderness palpation, no masses palpated. No hepatosplenomegaly. Bowel sounds positive.  Musculoskeletal: no clubbing / cyanosis. No joint deformity upper and lower extremities. Good ROM, no contractures. Normal muscle tone.  Skin: Very jaundiced, no rashes, lesions, ulcers. No induration Neurologic: CN 2-12 grossly intact. Sensation intact, DTR normal. Strength 5/5 in all 4.  Psychiatric:  Normal judgment and insight. Alert and oriented x 3. Normal mood.    Labs on Admission: I have personally reviewed following labs and imaging studies  CBC: Recent Labs  Lab 09/08/18 1230  WBC 10.6*  NEUTROABS 8.4*  HGB 18.7*  HCT 54.6*  MCV 92.7  PLT 008   Basic Metabolic Panel: Recent Labs  Lab 09/08/18 1230  NA 134*  K 4.3  CL 98  CO2 21*  GLUCOSE 90  BUN 10  CREATININE 0.62  CALCIUM 9.1   Liver Function Tests: Recent Labs  Lab 09/08/18 1230  AST 229*  ALT 516*  ALKPHOS 300*  BILITOT 18.3*  PROT 6.4*  ALBUMIN 3.1*   Recent Labs  Lab 09/08/18 1230  LIPASE 28   Recent Labs  Lab 09/08/18 1230  AMMONIA 32   Coagulation Profile: Recent Labs  Lab 09/08/18 1230  INR 1.1   Radiological Exams on Admission: Dg Chest 2 View  Result Date: 09/08/2018 CLINICAL DATA:  Jaundice with biliary obstruction. Symptoms for 1 months. EXAM: CHEST - 2 VIEW COMPARISON:  None. FINDINGS: Cardiomegaly. Thoracic atherosclerosis. Hyperinflation with increased markings suggesting COPD. No consolidation or edema. No effusion or pneumothorax. Bones unremarkable. IMPRESSION: No active cardiopulmonary disease. Electronically Signed   By: Staci Righter M.D.   On: 09/08/2018 15:00    EKG: Independently reviewed.  Right and left arm electrode reversal, interpretation assumes no reversal Sinus rhythm Probable left atrial enlargement Left anterior fascicular block Abnormal T, consider ischemia, lateral leads To prior are available for comparison.  Assessment/Plan Principal Problem:   Common biliary duct obstruction Active Problems:   COPD, mild (HCC)   Liver masses   Hypertension   Hyperlipidemia   Impression: 1.  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  Will be admitted to the hospital.  GI has been consulted.  I will make him n.p.o. after midnight  for possible ERCP.  Will likely need procedure to obtain biopsies if possible.  2.  Mild COPD start MDIs (and low-dose steroid inhaler if patient wheezing) to assist with mild hypoxemia  3.  Hypertension: Pressures currently stable.  We will continue to monitor.  4.  Hyperlipidemia: Noted patient on dietary control.  DVT prophylaxis: Subcu heparin Code Status: Full code Family Communication: Spoke with patient's daughter and wife were present in the room Disposition Plan: Likely home once biopsies obtained Consults called: GI Admission status: Inpatient   Lady Deutscher MD FACP [facility] Pager [phone]  How to contact the [facility] Attending or Consulting provider [place] or covering provider during after hours [place], for this patient?  1. Check the care team in [facility] and look for a) attending/consulting [facility] provider listed and b) the [facility] team listed 2. Log into [email or web address] and use [place]'s universal password to access. If you do not have the password, please contact the hospital operator. 3. Locate the [employer] provider  you are looking for under [facility] and page to a number that you can be directly reached. 4. If you still have difficulty reaching the provider, please page the [facility] (Director on Call) for the Hospitalists listed on amion for assistance.  If 7PM-7AM, please contact night-coverage [email or web address] Password [facility]  09/08/2018, 3:05 PM

## 2018-09-08 NOTE — ED Triage Notes (Signed)
Patient sent by doctor for possible bowel blockage - patient has had lower abd pain x 2 weeks, progressively getting worse with N/V and constipation (last BM this morning, but patient reports difficulty). Denies fevers/chills. U/S done at [place] office - faxed to ED.

## 2018-09-08 NOTE — H&P (View-Only) (Signed)
[facility] Consult  Referring Provider: Lady Deutscher, MD Primary Care Physician:  Mayra Neer, MD Primary Gastroenterologist: [facility]  Reason for Consultation: Obstructive jaundice  HPI: Ricky Conway is a 73 y.o. male was sent to ER by his primary care physician with abnormal ultrasound and labs. Within the last 1 month patient has had episodes of constipation, pale-colored stool and yellowish discoloration of his eyes and skin.  He has lost about 10 pounds in the last 1 month, denies abdominal pain or fever.  He is a smoker. He was found to have a bilirubin of 18.3, ALP of 300, AST 229, ALT 516 on admission in the ER. MRI/MRCP showed multifocal hepatic lesions/metastases at least 6 in number, largest measuring 3.5 cm, intrahepatic and extrahepatic ductal dilatation, abrupt narrowing at level of mid/distal common duct suggesting extrinsic compression/tumor involvement. 1.9 x 2.6 cm hypodense lesion arising from pancreatic head suspicious for pancreatic adenocarcinoma. Small upper abdominal and retroperitoneal lymph nodes noted. Colonoscopy from 2016 performed for history of polyps was reported as normal, repeat recommended in 5 years.  Past Medical History:  Diagnosis Date  . COPD (chronic obstructive pulmonary disease) ([place])   . Hyperlipidemia   . Hypertension     History reviewed. No pertinent surgical history.  Prior to Admission medications   Not on File    Current Facility-Administered Medications  Medication Dose Route Frequency Provider Last Rate Last Dose  . 0.9 % NaCl with KCl 20 mEq/ L  infusion   Intravenous Continuous Lady Deutscher, MD      . acetaminophen (TYLENOL) tablet 650 mg  650 mg Oral Q6H PRN Lady Deutscher, MD       Or  . acetaminophen (TYLENOL) suppository 650 mg  650 mg Rectal Q6H PRN Lady Deutscher, MD      . heparin injection 5,000 Units  5,000 Units Subcutaneous Q8H Lady Deutscher, MD      . ondansetron [facility])  tablet 4 mg  4 mg Oral Q6H PRN Lady Deutscher, MD       Or  . ondansetron [facility]) injection 4 mg  4 mg Intravenous Q6H PRN Lady Deutscher, MD      . oxyCODONE (Oxy IR/ROXICODONE) immediate release tablet 5 mg  5 mg Oral Q4H PRN Lady Deutscher, MD      . sodium chloride flush (NS) 0.9 % injection 3 mL  3 mL Intravenous Q12H Lady Deutscher, MD      . zolpidem (AMBIEN) tablet 5 mg  5 mg Oral QHS PRN Lady Deutscher, MD        Allergies as of 09/08/2018  . (No Known Allergies)    Family History  Problem Relation Age of Onset  . Diabetes Neg Hx   . CAD Neg Hx   . Cancer Neg Hx     Social History   Socioeconomic History  . Marital status: Married    Spouse name: Not on file  . Number of children: Not on file  . Years of education: Not on file  . Highest education level: Not on file  Occupational History  . Not on file  Social Needs  . Financial resource strain: Not on file  . Food insecurity:    Worry: Not on file    Inability: Not on file  . Transportation needs:    Medical: Not on file    Non-medical: Not on file  Tobacco Use  . Smoking status: Heavy Tobacco Smoker  Packs/day: 1.00    Years: 50.00    Pack years: 50.00    Types: Cigarettes  . Smokeless tobacco: Never Used  . Tobacco comment: Not currently ready to quit  Substance and Sexual Activity  . Alcohol use: Not on file  . Drug use: Not on file  . Sexual activity: Not on file  Lifestyle  . Physical activity:    Days per week: Not on file    Minutes per session: Not on file  . Stress: Not on file  Relationships  . Social connections:    Talks on phone: Not on file    Gets together: Not on file    Attends religious service: Not on file    Active member of club or organization: Not on file    Attends meetings of clubs or organizations: Not on file    Relationship status: Not on file  . Intimate partner violence:    Fear of current or ex partner: Not on file    Emotionally abused:  Not on file    Physically abused: Not on file    Forced sexual activity: Not on file  Other Topics Concern  . Not on file  Social History Narrative  . Not on file    Review of Systems:  GI: Described in detail in HPI.    Gen: Denies any fever, chills, rigors, night sweats, anorexia, fatigue, weakness, malaise, involuntary weight loss, and sleep disorder CV: Denies chest pain, angina, palpitations, syncope, orthopnea, PND, peripheral edema, and claudication. Resp: Denies dyspnea, cough, sputum, wheezing, coughing up blood. GU : Denies urinary burning, blood in urine, urinary frequency, urinary hesitancy, nocturnal urination, and urinary incontinence. MS: Denies joint pain or swelling.  Denies muscle weakness, cramps, atrophy.  Derm: Denies rash, itching, oral ulcerations, hives, unhealing ulcers.  Psych: Denies depression, anxiety, memory loss, suicidal ideation, hallucinations,  and confusion. Heme: Denies bruising, bleeding, and enlarged lymph nodes. Neuro:  Denies any headaches, dizziness, paresthesias. Endo:  Denies any problems with DM, thyroid, adrenal function.  Physical Exam: Vital signs in last 24 hours: Temp:  [97.6 F (36.4 C)-98.1 F (36.7 C)] 98.1 F (36.7 C) (03/16 1604) Pulse Rate:  [68-79] 79 (03/16 1604) Resp:  [14-19] 18 (03/16 1604) BP: (129-161)/(63-87) 161/76 (03/16 1604) SpO2:  [87 %-92 %] 90 % (03/16 1604) Last BM Date: 09/08/18  General:   Alert,  Well-developed, well-nourished, pleasant and cooperative in NAD Head:  Normocephalic and atraumatic. Eyes:  Sclera clear, deep icterus.   Conjunctiva pink. Ears:  Normal auditory acuity. Nose:  No deformity, discharge,  or lesions. Mouth:  No deformity or lesions.  Oropharynx pink & moist. Neck:  Supple; no masses or thyromegaly. Lungs:  Clear throughout to auscultation.   No wheezes, crackles, or rhonchi. No acute distress. Heart:  Regular rate and rhythm; no murmurs, clicks, rubs,  or gallops. Extremities:   Without clubbing or edema. Neurologic:  Alert and  oriented x4;  grossly normal neurologically. Skin:  Intact without significant lesions or rashes. Psych:  Alert and cooperative. Normal mood and affect. Abdomen:  Soft, nontender and nondistended. No masses, hepatosplenomegaly or hernias noted. Normal bowel sounds, without guarding, and without rebound.         Lab Results: Recent Labs    09/08/18 1230  WBC 10.6*  HGB 18.7*  HCT 54.6*  PLT 235   BMET Recent Labs    09/08/18 1230  NA 134*  K 4.3  CL 98  CO2 21*  GLUCOSE 90  BUN 10  CREATININE 0.62  CALCIUM 9.1   LFT Recent Labs    09/08/18 1230  PROT 6.4*  ALBUMIN 3.1*  AST 229*  ALT 516*  ALKPHOS 300*  BILITOT 18.3*   PT/INR Recent Labs    09/08/18 1230  LABPROT 14.2  INR 1.1    Studies/Results: Dg Chest 2 View  Result Date: 09/08/2018 CLINICAL DATA:  Jaundice with biliary obstruction. Symptoms for 1 months. EXAM: CHEST - 2 VIEW COMPARISON:  None. FINDINGS: Cardiomegaly. Thoracic atherosclerosis. Hyperinflation with increased markings suggesting COPD. No consolidation or edema. No effusion or pneumothorax. Bones unremarkable. IMPRESSION: No active cardiopulmonary disease. Electronically Signed   By: Staci Righter M.D.   On: 09/08/2018 15:00   Mr Abdomen Mrcp W Wo Contast  Result Date: 09/08/2018 CLINICAL DATA:  Abnormal LFTs, liver lesions ultrasound EXAM: MRI ABDOMEN WITHOUT AND WITH CONTRAST (INCLUDING MRCP) TECHNIQUE: Multiplanar multisequence MR imaging of the abdomen was performed both before and after the administration of intravenous contrast. Heavily T2-weighted images of the biliary and pancreatic ducts were obtained, and three-dimensional MRCP images were rendered by post processing. CONTRAST:  8 mL Gadovist IV COMPARISON:  Right upper quadrant ultrasound dated 09/03/2018. CT abdomen/pelvis dated 07/31/2016. FINDINGS: Motion degraded images, particularly dynamic postcontrast imaging, which is  severely degraded. Lower chest: Minimal dependent atelectasis lung bases. Hepatobiliary: Multifocal hepatic lesions/metastases, at least 6 in number, including: --2.7 cm lesion in segment 2 (series 5/image 14) --2.7 cm lesion in segment 7 (series 5/image 14) --Two lesions in segment 4A measuring 2.8 and 3.5 cm (series 5/image 15) --2.5 cm lesion in segment 6 (series 5/image 35) Distended gallbladder with layering tiny gallstones and gallbladder sludge (series 5/image 36). Intrahepatic and extrahepatic ductal dilatation. Central common duct measures 18 mm. Abrupt narrowing at the level of the mid/distal common duct (series 4/image 31), suggesting extrinsic compression/tumor involvement. Pancreas: 1.9 x 2.6 cm hypoenhancing lesion arising exophytically along the posterior aspect of the pancreatic head (series 1301/image 42), suspicious for pancreatic adenocarcinoma. No pancreatic atrophy or ductal dilatation. Spleen:  Within normal limits. Adrenals/Urinary Tract:  Adrenal glands are within normal limits. Tiny bilateral renal cysts.  No hydronephrosis. Stomach/Bowel: Stomach is within normal limits. Visualized bowel is unremarkable. Vascular/Lymphatic: No evidence of abdominal aortic aneurysm. Suspected lesion abuts the IVC. Small upper abdominal/retroperitoneal lymph nodes, including a 2.4 cm short axis left para-aortic node (series 3/image 28), poorly visualized. Other:  No abdominal ascites. Musculoskeletal: No focal osseous lesions. IMPRESSION: Limited evaluation due to motion degraded imaging. 1.9 x 2.6 cm hyperenhancing lesion arising along the posterior aspect of the pancreatic head, suspicious for pancreatic adenocarcinoma. Suspected involvement of the distal common duct, with secondary intrahepatic/extrahepatic ductal dilatation. Lesion also abuts the IVC. Multifocal hepatic metastases, with index lesions as above. Small upper abdominal/retroperitoneal lymph nodes, poorly evaluated. Electronically Signed   By:  Julian Hy M.D.   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

## 2018-09-08 NOTE — ED Notes (Signed)
Pt ambulatory to bathroom with no reported issues. 

## 2018-09-09 ENCOUNTER — Inpatient Hospital Stay (HOSPITAL_COMMUNITY): Payer: 59 | Admitting: Certified Registered Nurse Anesthetist

## 2018-09-09 ENCOUNTER — Encounter (HOSPITAL_COMMUNITY): Payer: Self-pay | Admitting: *Deleted

## 2018-09-09 ENCOUNTER — Inpatient Hospital Stay (HOSPITAL_COMMUNITY): Payer: 59

## 2018-09-09 ENCOUNTER — Encounter (HOSPITAL_COMMUNITY)
Admission: EM | Disposition: A | Discharge status: Home / Self Care | Payer: Self-pay | Source: Home / Self Care | Attending: Internal Medicine

## 2018-09-09 DIAGNOSIS — J449 Chronic obstructive pulmonary disease, unspecified: Secondary | ICD-10-CM

## 2018-09-09 DIAGNOSIS — R17 Unspecified jaundice: Secondary | ICD-10-CM

## 2018-09-09 DIAGNOSIS — I10 Essential (primary) hypertension: Secondary | ICD-10-CM

## 2018-09-09 HISTORY — PX: BILIARY BRUSHING: SHX6843

## 2018-09-09 HISTORY — PX: BILIARY STENT PLACEMENT: SHX5538

## 2018-09-09 HISTORY — PX: REMOVAL OF STONES: SHX5545

## 2018-09-09 HISTORY — PX: ERCP: SHX5425

## 2018-09-09 HISTORY — PX: SPHINCTEROTOMY: SHX5544

## 2018-09-09 LAB — COMPREHENSIVE METABOLIC PANEL
ALT: 412 U/L — ABNORMAL HIGH (ref 0–44)
AST: 186 U/L — ABNORMAL HIGH (ref 15–41)
Albumin: 2.6 g/dL — ABNORMAL LOW (ref 3.5–5.0)
Alkaline Phosphatase: 269 U/L — ABNORMAL HIGH (ref 38–126)
Anion gap: 7 (ref 5–15)
BUN: 8 mg/dL (ref 8–23)
CO2: 23 mmol/L (ref 22–32)
Calcium: 8.2 mg/dL — ABNORMAL LOW (ref 8.9–10.3)
Chloride: 106 mmol/L (ref 98–111)
Creatinine, Ser: 0.55 mg/dL — ABNORMAL LOW (ref 0.61–1.24)
GFR calc Af Amer: >60 mL/min (ref >60)
GFR calc non Af Amer: >60 mL/min (ref >60)
Glucose, Bld: 96 mg/dL (ref 70–99)
Potassium: 4 mmol/L (ref 3.5–5.1)
Sodium: 136 mmol/L (ref 135–145)
Total Bilirubin: 16.5 mg/dL — ABNORMAL HIGH (ref 0.3–1.2)
Total Protein: 5.5 g/dL — ABNORMAL LOW (ref 6.5–8.1)

## 2018-09-09 LAB — HEPATITIS PANEL, ACUTE
HCV Ab: <0.1 s/co ratio (ref 0.0–0.9)
Hep A IgM: NEGATIVE
Hep B C IgM: NEGATIVE
Hepatitis B Surface Ag: NEGATIVE

## 2018-09-09 LAB — CBC
HCT: 49.8 % (ref 39.0–52.0)
Hemoglobin: 17.2 g/dL — ABNORMAL HIGH (ref 13.0–17.0)
MCH: 31.7 pg (ref 26.0–34.0)
MCHC: 34.5 g/dL (ref 30.0–36.0)
MCV: 91.9 fL (ref 80.0–100.0)
Platelets: 221 10*3/uL (ref 150–400)
RBC: 5.42 MIL/uL (ref 4.22–5.81)
RDW: 14.9 % (ref 11.5–15.5)
WBC: 9.7 10*3/uL (ref 4.0–10.5)
nRBC: 0 % (ref 0.0–0.2)

## 2018-09-09 LAB — CANCER ANTIGEN 19-9: CA 19-9: 118 U/mL — ABNORMAL HIGH (ref 0–35)

## 2018-09-09 LAB — AFP TUMOR MARKER: AFP, Serum, Tumor Marker: 1.3 ng/mL (ref 0.0–8.3)

## 2018-09-09 SURGERY — ERCP, WITH INTERVENTION IF INDICATED
Anesthesia: General

## 2018-09-09 MED ORDER — LIDOCAINE HCL (CARDIAC) PF 100 MG/5ML IV SOSY
PREFILLED_SYRINGE | INTRAVENOUS | Status: DC | PRN
  Administered 2018-09-09: 60 mg via INTRAVENOUS

## 2018-09-09 MED ORDER — ONDANSETRON HCL 4 MG/2ML IJ SOLN
INTRAMUSCULAR | Status: DC | PRN
  Administered 2018-09-09: 4 mg via INTRAVENOUS

## 2018-09-09 MED ORDER — GLUCAGON HCL RDNA (DIAGNOSTIC) 1 MG IJ SOLR
INTRAMUSCULAR | Status: AC
  Filled 2018-09-09: qty 1

## 2018-09-09 MED ORDER — SODIUM CHLORIDE 0.9 % IV SOLN
INTRAVENOUS | Status: DC | PRN
  Administered 2018-09-09: 25 mL

## 2018-09-09 MED ORDER — FENTANYL CITRATE (PF) 100 MCG/2ML IJ SOLN
INTRAMUSCULAR | Status: DC | PRN
  Administered 2018-09-09: 100 ug via INTRAVENOUS

## 2018-09-09 MED ORDER — INDOMETHACIN 50 MG RE SUPP
RECTAL | Status: AC
  Filled 2018-09-09: qty 2

## 2018-09-09 MED ORDER — PROPOFOL 10 MG/ML IV BOLUS
INTRAVENOUS | Status: DC | PRN
  Administered 2018-09-09: 20 mg via INTRAVENOUS
  Administered 2018-09-09: 120 mg via INTRAVENOUS

## 2018-09-09 MED ORDER — CIPROFLOXACIN IN D5W 400 MG/200ML IV SOLN
INTRAVENOUS | Status: AC
  Filled 2018-09-09: qty 200

## 2018-09-09 MED ORDER — CIPROFLOXACIN IN D5W 400 MG/200ML IV SOLN
INTRAVENOUS | Status: DC | PRN
  Administered 2018-09-09: 400 mg via INTRAVENOUS

## 2018-09-09 MED ORDER — INDOMETHACIN 50 MG RE SUPP
RECTAL | Status: DC | PRN
  Administered 2018-09-09: 100 mg via RECTAL

## 2018-09-09 MED ORDER — SODIUM CHLORIDE 0.9 % IV SOLN
INTRAVENOUS | Status: DC | PRN
  Administered 2018-09-09: 12:00:00 via INTRAVENOUS

## 2018-09-09 MED ORDER — SUCCINYLCHOLINE CHLORIDE 200 MG/10ML IV SOSY
PREFILLED_SYRINGE | INTRAVENOUS | Status: DC | PRN
  Administered 2018-09-09: 80 mg via INTRAVENOUS

## 2018-09-09 MED ORDER — PHENYLEPHRINE 40 MCG/ML (10ML) SYRINGE FOR IV PUSH (FOR BLOOD PRESSURE SUPPORT)
PREFILLED_SYRINGE | INTRAVENOUS | Status: DC | PRN
  Administered 2018-09-09: 80 ug via INTRAVENOUS

## 2018-09-09 MED ORDER — DEXAMETHASONE SODIUM PHOSPHATE 10 MG/ML IJ SOLN
INTRAMUSCULAR | Status: DC | PRN
  Administered 2018-09-09: 10 mg via INTRAVENOUS

## 2018-09-09 NOTE — Anesthesia Preprocedure Evaluation (Addendum)
Anesthesia Evaluation  Patient identified by MRN, date of birth, ID band Patient awake    Reviewed: Allergy & Precautions, NPO status , Patient's Chart, lab work & pertinent test results  History of Anesthesia Complications Negative for: history of anesthetic complications  Airway Mallampati: III  TM Distance: >3 FB Neck ROM: Full    Dental  (+) Dental Advisory Given   Pulmonary COPD, Current Smoker,    breath sounds clear to auscultation       Cardiovascular hypertension,  Rhythm:Regular     Neuro/Psych negative neurological ROS  negative psych ROS   GI/Hepatic Neg liver ROS, CBD OBSTRUCTION, OBSTRUCTIVE JAUNDICE   Endo/Other  negative endocrine ROS  Renal/GU negative Renal ROS     Musculoskeletal negative musculoskeletal ROS (+)   Abdominal   Peds  Hematology negative hematology ROS (+)   Anesthesia Other Findings   Reproductive/Obstetrics                            Anesthesia Physical Anesthesia Plan  ASA: III  Anesthesia Plan: General   Post-op Pain Management:    Induction: Intravenous  PONV Risk Score and Plan: 1 and Ondansetron and Dexamethasone  Airway Management Planned: Oral ETT  Additional Equipment: None  Intra-op Plan:   Post-operative Plan: Extubation in OR  Informed Consent: I have reviewed the patients History and Physical, chart, labs and discussed the procedure including the risks, benefits and alternatives for the proposed anesthesia with the patient or authorized representative who has indicated his/her understanding and acceptance.     Dental advisory given  Plan Discussed with: Surgeon and CRNA  Anesthesia Plan Comments:         Anesthesia Quick Evaluation

## 2018-09-09 NOTE — Interval H&P Note (Signed)
History and Physical Interval Note: 72/male with obstructive jaundice, pancreatic lesion and possible liver metastasis.  09/09/2018 11:37 AM  Ricky Conway  has presented today for ERCP, with the diagnosis of CBD OBSTRUCTION, OBSTRUCTIVE JAUNDICE.  The various methods of treatment have been discussed with the patient and family. After consideration of risks, benefits and other options for treatment, the patient has consented to  Procedure(s): ENDOSCOPIC RETROGRADE CHOLANGIOPANCREATOGRAPHY (ERCP) (N/A) as a surgical intervention.  The patient's history has been reviewed, patient examined, no change in status, stable for surgery.  I have reviewed the patient's chart and labs.  Questions were answered to the patient's satisfaction.     Ronnette Juniper

## 2018-09-09 NOTE — Anesthesia Procedure Notes (Signed)
Procedure Name: Intubation Date/Time: 09/09/2018 11:52 AM Performed by: Raenette Rover, CRNA Pre-anesthesia Checklist: Patient identified, Emergency Drugs available, Suction available and Patient being monitored Patient Re-evaluated:Patient Re-evaluated prior to induction Oxygen Delivery Method: Circle system utilized Preoxygenation: Pre-oxygenation with 100% oxygen Induction Type: IV induction Ventilation: Mask ventilation without difficulty and Oral airway inserted - appropriate to patient size Laryngoscope Size: Mac and 4 Grade View: Grade I Tube type: Oral Tube size: 7.5 mm Number of attempts: 1 Airway Equipment and Method: Stylet Placement Confirmation: ETT inserted through vocal cords under direct vision,  positive ETCO2,  CO2 detector and breath sounds checked- equal and bilateral Secured at: 22 cm Tube secured with: Tape Dental Injury: Teeth and Oropharynx as per pre-operative assessment

## 2018-09-09 NOTE — Brief Op Note (Signed)
09/08/2018 - 09/09/2018  12:43 PM  PATIENT:  Ricky Conway  73 y.o. male  PRE-OPERATIVE DIAGNOSIS:  CBD OBSTRUCTION, OBSTRUCTIVE JAUNDICE  POST-OPERATIVE DIAGNOSIS:  * No post-op diagnosis entered *  PROCEDURE:  Procedure(s): ENDOSCOPIC RETROGRADE CHOLANGIOPANCREATOGRAPHY (ERCP) (N/A)  SURGEON:  Surgeon(s) and Role:    * Ronnette Juniper, MD - Primary  PHYSICIAN ASSISTANT:   ASSISTANTS: Jeanella Cara, RN, Janie Billups, Tech   ANESTHESIA:   MAC  EBL:  Minimal  BLOOD ADMINISTERED:none  DRAINS: none   LOCAL MEDICATIONS USED:  NONE  SPECIMEN:  Source of Specimen:  cytology brushing  DISPOSITION OF SPECIMEN:  PATHOLOGY  COUNTS:  YES  TOURNIQUET:  * No tourniquets in log *  DICTATION: .Dragon Dictation  PLAN OF CARE: Admit to inpatient   PATIENT DISPOSITION:  PACU - hemodynamically stable.   Delay start of Pharmacological VTE agent (>24hrs) due to surgical blood loss or risk of bleeding: no

## 2018-09-09 NOTE — Op Note (Signed)
[facility] Patient Name: Ricky Conway Procedure Date : 09/09/2018 MRN: [id number] Attending MD: Ronnette Juniper , MD Date of Birth: 05-30-1946 CSN: [id number] Age: 73 Admit Type: Inpatient Procedure:                ERCP Indications:              Jaundice, Tumor of the head of pancreas,                            intrahepatic and extrahepatic biliary dilation,                            multiple liver lesions Providers:                Ronnette Juniper, MD, Jeanella Cara, RN, Cherylynn Ridges, Technician, Hedy Camara Referring MD:              Medicines:                Monitored Anesthesia Care Complications:            No immediate complications. Estimated blood loss:                            Minimal. Estimated Blood Loss:     Estimated blood loss was minimal. Procedure:                Pre-Anesthesia Assessment:                           - Prior to the procedure, a History and Physical                            was performed, and patient medications and                            allergies were reviewed. The patient's tolerance of                            previous anesthesia was also reviewed. The risks                            and benefits of the procedure and the sedation                            options and risks were discussed with the patient.                            All questions were answered, and informed consent                            was obtained. Prior Anticoagulants: The patient has                            taken no previous anticoagulant  or antiplatelet                            agents. ASA Grade Assessment: III - A patient with                            severe systemic disease. After reviewing the risks                            and benefits, the patient was deemed in                            satisfactory condition to undergo the procedure.                           After obtaining informed consent, the scope  was                            passed under direct vision. Throughout the                            procedure, the patient's blood pressure, pulse, and                            oxygen saturations were monitored continuously. The                            [id number]-Q180V ([id number]) Olympus duodenoscope was                            introduced through the mouth, and used to inject                            contrast into and used to inject contrast into the                            bile duct. The ERCP was accomplished without                            difficulty. The patient tolerated the procedure                            well. Scope In: Scope Out: Findings:      The scout film was normal. The esophagus was successfully intubated       under direct vision. The scope was advanced to a normal major papilla in       the descending duodenum without detailed examination of the pharynx,       larynx and associated structures, and upper GI tract. The upper GI tract       was grossly normal. Erythematous and congested mucosa was noted in the       antrum and duodenum.      The bile duct was deeply cannulated with the sphincterotome. Contrast       was injected.      The upper third  of the main bile duct was markedly dilated, secondary to       a stricture in the mid and distal CBD. The largest diameter was 18 mm in       proximal CBD.      A straight Roadrunner wire was passed into the biliary tree.      An 8 mm biliary sphincterotomy was made with a braided sphincterotome       using ERBE electrocautery. The sphincterotomy oozed blood.      Cells for cytology were obtained by brushing in the mid and lower third       of the main bile duct.      The biliary tree was swept with a 12 mm balloon starting at the       bifurcation, but due to luminal narrowing in the mid and distal CBD, it       had to be deflated to complete ballon sweeps. Copious amount of dark,       black bile was removed.       One 10 mm by 6 cm covered metal stent was placed 5 cm into the common       bile duct. Bile flowed through the stent. The stent was in good position. Impression:               - The upper third of the main bile duct was                            markedly dilated, secondary to a stricture in mid                            and distal CBD.                           - A biliary sphincterotomy was performed.                           - The biliary tree was swept and nothing was found.                           - Cells for cytology obtained in the lower and mid                            third of the main duct.                           - One covered metal stent was placed into the                            common bile duct. Moderate Sedation:      Patient did not receive moderate sedation for this procedure, but       instead received monitored anesthesia care. Recommendation:           - Clear liquid diet for 2 hours.                           - Advance diet as tolerated - resume regular diet  today.                           - Await cytology results. If inconclusive, patient                            may need a liver biopsy.                           - Oncology consult. Procedure Code(s):        --- Professional ---                           [phone], Endoscopic retrograde                            cholangiopancreatography (ERCP); with placement of                            endoscopic stent into biliary or pancreatic duct,                            including pre- and post-dilation and guide wire                            passage, when performed, including sphincterotomy,                            when performed, each stent Diagnosis Code(s):        --- Professional ---                           K83.1, Obstruction of bile duct                           R17, Unspecified jaundice                           D49.0, Neoplasm of unspecified behavior of                             digestive system CPT copyright 2018 [employer]. All rights reserved. The codes documented in this report are preliminary and upon coder review may  be revised to meet current compliance requirements. Ronnette Juniper, MD 09/09/2018 12:43:12 PM This report has been signed electronically. Number of Addenda: 0

## 2018-09-09 NOTE — Progress Notes (Signed)
[facility]                                                                              Patient Demographics  Ricky Conway, is a 73 y.o. male, DOB - [birth year], UEA:[id number]  Admit date - 09/08/2018   Admitting Physician Lady Deutscher, MD  Outpatient Primary MD for the patient is Mayra Neer, MD  Outpatient specialists:   LOS - 1  days   Medical records reviewed and are as summarized below:    Chief Complaint  Patient presents with   Abdominal Pain       Brief summary   ance Ricky Conway is a 73 y.o. male with medical history significant for a paucity of medical problems and on no medications.  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  He presented to his primary care doctor recently and was found to have elevated LFTs.  An ultrasound of the abdomen was obtained and he was found to have 3 lesions in the liver that were highly suspicious for carcinoma and intra-and extrahepatic biliary ductal dilatation.  A cause for biliary obstruction was not identified but a constellation of findings raise the possibility of a pancreatic or ampullary mass.  MRCP was recommended. Patient reports an insidious onset of generalized abdominal pain and discomfort associated with dyspepsia, intermittent constipation and diarrhea as well as jaundice, dark urine and pale stools.    Assessment & Plan    Principal Problem:   Common biliary duct obstruction -GI consulted, n.p.o. today for ERCP - will start clear liquid diet after the ERCP, follow biopsies -Repeat LFTs in a.m.  Active Problems:   Liver masses -Concerning for possible mets, awaiting ERCP results    Hypertension -Currently BP stable, continue to monitor    Hyperlipidemia -Dietary control    COPD, mild (HCC) -Stable, no wheezing  Code Status: full  DVT Prophylaxis: SCD's Family Communication: Discussed in  detail with the patient, all imaging results, lab results explained to the patient    Disposition Plan: Awaiting ERCP results, hopefully tomorrow  Time Spent in minutes 25 minutes  Procedures:  None  Consultants:   GI  Antimicrobials:   Anti-infectives (From admission, onward)   None          Medications  Scheduled Meds:  feeding supplement (ENSURE ENLIVE)  237 mL Oral BID BM   heparin  5,000 Units Subcutaneous Q8H   sodium chloride flush  3 mL Intravenous Q12H   Continuous Infusions:  0.9 % NaCl with KCl 20 mEq / Ricky 125 mL/hr at 09/09/18 1003   PRN Meds:.acetaminophen **OR** acetaminophen, ondansetron **OR** ondansetron (ZOFRAN) IV, oxyCODONE, zolpidem      Subjective:   Ricky Conway was seen and examined today.  No complaints at the time of my examination.  Patient denies dizziness, chest pain, shortness of breath, abdominal pain, N/V/D/C. No acute events overnight.    Objective:   Vitals:   09/09/18 1246 09/09/18 1256 09/09/18 1306 09/09/18 1340  BP: (!) 159/44 (!) 183/82 (!) 166/75 (!) 156/81  Pulse: 84 72  72 63  Resp: 18 14 15 16   Temp: 97.7 F (36.5 C)   (!) 97.5 F (36.4 C)  TempSrc: Axillary   Oral  SpO2: 92% 93% 94% 95%  Weight:      Height:        Intake/Output Summary (Last 24 hours) at 09/09/2018 1445 Last data filed at 09/09/2018 1336 Gross per 24 hour  Intake 4226.74 ml  Output 0 ml  Net 4226.74 ml     Wt Readings from Last 3 Encounters:  09/08/18 78.9 kg     Exam  General: Alert and oriented x 3, NAD  Eyes:   HEENT:    Cardiovascular: S1 S2 auscultated, Regular rate and rhythm.  Respiratory: Clear to auscultation bilaterally, no wheezing, rales or rhonchi  Gastrointestinal: Soft, mild epigastric tenderness on deep palpation, NBS  Ext: no pedal edema bilaterally  Neuro: No new deficits  Musculoskeletal: No digital cyanosis, clubbing  Skin: No rashes  Psych: Normal affect and demeanor, alert and oriented x3      Data Reviewed:  I have personally reviewed following labs and imaging studies  Micro Results No results found for this or any previous visit (from the past 240 hour(s)).  Radiology Reports Dg Chest 2 View  Result Date: 09/08/2018 CLINICAL DATA:  Jaundice with biliary obstruction. Symptoms for 1 months. EXAM: CHEST - 2 VIEW COMPARISON:  None. FINDINGS: Cardiomegaly. Thoracic atherosclerosis. Hyperinflation with increased markings suggesting COPD. No consolidation or edema. No effusion or pneumothorax. Bones unremarkable. IMPRESSION: No active cardiopulmonary disease. Electronically Signed   By: Staci Righter M.D.   On: 09/08/2018 15:00   Dg Ercp Biliary & Pancreatic Ducts  Result Date: 09/09/2018 CLINICAL DATA:  Pancreatic mass, biliary obstruction and liver lesions. EXAM: ERCP TECHNIQUE: Multiple spot images obtained with the fluoroscopic device and submitted for interpretation post-procedure. COMPARISON:  MRI of the abdomen on 09/08/2018 FINDINGS: Images obtained with a C-arm during the endoscopic procedure demonstrate cannulation of the common bile duct with contrast injection demonstrating high-grade stricture and obstruction of the distal common bile duct. There is diffuse dilatation of intrahepatic bile ducts and the proximal CBD. A self expanding metallic biliary stent was placed spanning across the level of obstruction. IMPRESSION: High-grade stricture and obstruction of the distal common bile duct treated with placement of a self expanding metallic biliary stent. These images were submitted for radiologic interpretation only. Please see the procedural report for the amount of contrast and the fluoroscopy time utilized. Electronically Signed   By: Aletta Edouard M.D.   On: 09/09/2018 14:33   Mr Abdomen Mrcp Moise Boring Contast  Result Date: 09/08/2018 CLINICAL DATA:  Abnormal LFTs, liver lesions ultrasound EXAM: MRI ABDOMEN WITHOUT AND WITH CONTRAST (INCLUDING MRCP) TECHNIQUE: Multiplanar  multisequence MR imaging of the abdomen was performed both before and after the administration of intravenous contrast. Heavily T2-weighted images of the biliary and pancreatic ducts were obtained, and three-dimensional MRCP images were rendered by post processing. CONTRAST:  8 mL Gadovist IV COMPARISON:  Right upper quadrant ultrasound dated 09/03/2018. CT abdomen/pelvis dated 07/31/2016. FINDINGS: Motion degraded images, particularly dynamic postcontrast imaging, which is severely degraded. Lower chest: Minimal dependent atelectasis lung bases. Hepatobiliary: Multifocal hepatic lesions/metastases, at least 6 in number, including: --2.7 cm lesion in segment 2 (series 5/image 14) --2.7 cm lesion in segment 7 (series 5/image 14) --Two lesions in segment 4A measuring 2.8 and 3.5 cm (series 5/image 15) --2.5 cm lesion in segment 6 (series 5/image 35) Distended gallbladder with layering tiny gallstones and  gallbladder sludge (series 5/image 36). Intrahepatic and extrahepatic ductal dilatation. Central common duct measures 18 mm. Abrupt narrowing at the level of the mid/distal common duct (series 4/image 31), suggesting extrinsic compression/tumor involvement. Pancreas: 1.9 x 2.6 cm hypoenhancing lesion arising exophytically along the posterior aspect of the pancreatic head (series 1301/image 42), suspicious for pancreatic adenocarcinoma. No pancreatic atrophy or ductal dilatation. Spleen:  Within normal limits. Adrenals/Urinary Tract:  Adrenal glands are within normal limits. Tiny bilateral renal cysts.  No hydronephrosis. Stomach/Bowel: Stomach is within normal limits. Visualized bowel is unremarkable. Vascular/Lymphatic: No evidence of abdominal aortic aneurysm. Suspected lesion abuts the IVC. Small upper abdominal/retroperitoneal lymph nodes, including a 2.4 cm short axis left para-aortic node (series 3/image 28), poorly visualized. Other:  No abdominal ascites. Musculoskeletal: No focal osseous lesions. IMPRESSION:  Limited evaluation due to motion degraded imaging. 1.9 x 2.6 cm hyperenhancing lesion arising along the posterior aspect of the pancreatic head, suspicious for pancreatic adenocarcinoma. Suspected involvement of the distal common duct, with secondary intrahepatic/extrahepatic ductal dilatation. Lesion also abuts the IVC. Multifocal hepatic metastases, with index lesions as above. Small upper abdominal/retroperitoneal lymph nodes, poorly evaluated. Electronically Signed   By: Julian Hy M.D.   On: 09/08/2018 15:41   US Abdomen Limited Ruq  Result Date: 09/03/2018 CLINICAL DATA:  Elevated liver function tests. EXAM: ULTRASOUND ABDOMEN LIMITED RIGHT UPPER QUADRANT COMPARISON:  CT abdomen and pelvis 07/31/2016. FINDINGS: Gallbladder: The gallbladder is completely filled with sludge and mildly distended. No wall thickening or pericholecystic fluid. Sonographer reports negative Murphy's sign. Common bile duct: Diameter: 12 mm. Liver: Three solid lesions with mildly decreased echogenicity are identified. The largest is in the right lobe measuring 2.9 x 3.3 x 3.4 cm. A second lesion in the right lobe measures 2.6 cm in diameter and a lesion in the left lobe measures 1.8 cm in diameter. There is intrahepatic biliary ductal dilatation. Portal vein is patent on color Doppler imaging with normal direction of blood flow towards the liver. IMPRESSION: 3 lesions in the liver are highly suspicious for carcinoma. Intra and extrahepatic biliary ductal dilatation. Cause for obstruction is not identified but constellation of findings raise the possibility of a pancreatic or ampullary mass. MRI/MRCP of the abdomen with and without contrast is recommended for further evaluation. Distended gallbladder is filled with sludge. Electronically Signed   By: Inge Rise M.D.   On: 09/03/2018 14:18    Lab Data:  CBC: Recent Labs  Lab 09/08/18 1230 09/09/18 0548  WBC 10.6* 9.7  NEUTROABS 8.4*  --   HGB 18.7* 17.2*  HCT  54.6* 49.8  MCV 92.7 91.9  PLT 235 229   Basic Metabolic Panel: Recent Labs  Lab 09/08/18 1230 09/09/18 0548  NA 134* 136  K 4.3 4.0  CL 98 106  CO2 21* 23  GLUCOSE 90 96  BUN 10 8  CREATININE 0.62 0.55*  CALCIUM 9.1 8.2*   GFR: Estimated Creatinine Clearance: 83.6 mL/min (A) (by C-G formula based on SCr of 0.55 mg/dL (Ricky)). Liver Function Tests: Recent Labs  Lab 09/08/18 1230 09/09/18 0548  AST 229* 186*  ALT 516* 412*  ALKPHOS 300* 269*  BILITOT 18.3* 16.5*  PROT 6.4* 5.5*  ALBUMIN 3.1* 2.6*   Recent Labs  Lab 09/08/18 1230  LIPASE 28   Recent Labs  Lab 09/08/18 1230  AMMONIA 32   Coagulation Profile: Recent Labs  Lab 09/08/18 1230  INR 1.1   Cardiac Enzymes: No results for input(s): CKTOTAL, CKMB, CKMBINDEX, TROPONINI in the last  168 hours. BNP (last 3 results) No results for input(s): PROBNP in the last 8760 hours. HbA1C: No results for input(s): HGBA1C in the last 72 hours. CBG: No results for input(s): GLUCAP in the last 168 hours. Lipid Profile: No results for input(s): CHOL, HDL, LDLCALC, TRIG, CHOLHDL, LDLDIRECT in the last 72 hours. Thyroid Function Tests: No results for input(s): TSH, T4TOTAL, FREET4, T3FREE, THYROIDAB in the last 72 hours. Anemia Panel: No results for input(s): VITAMINB12, FOLATE, FERRITIN, TIBC, IRON, RETICCTPCT in the last 72 hours. Urine analysis: No results found for: COLORURINE, APPEARANCEUR, LABSPEC, PHURINE, GLUCOSEU, HGBUR, BILIRUBINUR, KETONESUR, PROTEINUR, UROBILINOGEN, NITRITE, LEUKOCYTESUR   Hannie Shoe M.D. [facility] 09/09/2018, 2:45 PM  Pager: [phone] Between 7am to 7pm - call Pager - [phone]  After 7pm go to [email or web address] - password [email or web address]  Call night coverage person covering after 7pm

## 2018-09-09 NOTE — Progress Notes (Addendum)
Initial Nutrition Assessment  DOCUMENTATION CODES:   Non-severe (moderate) malnutrition in context of chronic illness  INTERVENTION:  Ensure Enlive po BID, each supplement provides 350 kcal and 20 grams of protein   NUTRITION DIAGNOSIS:   Moderate Malnutrition related to chronic illness(Common Biliary Duct Obstruction, possible pancreatic cancer/metastases) as evidenced by moderate muscle depletion, moderate fat depletion.  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 Presented to ED with elevated LFTs with findings of 3 liver lesions, suspicious for carcinoma and intra-and extrahepatic biliary ductal dilatation. Pt now admitted tieh common biliary duct obstruction.    3/16 Admitted, GI consulted. NPO at midnight for possible ERCP and likely biopsies.   Per Gastroenterology note: Impression is pancreatic cancer with distal bile duct obstruction and liver masses suspicious for metastases  Pt just got to sleep, wife accidentally woke him up when talking to Intern. Pt is very frustrated/aggravated/agitated to answer questions stating, "you can get all this information from her."  Pt states that he is very hungry and he has an appetite, but "y'all won't let me eat," reciprocated his diet was getting ready to change to regular.  Wouldn't provide diet recall. Wife can only provide dinner meals, stating its typical meats chicken, pork, fish, and various starches and vegetables. States, "he is out in the field all day, I don't what or if he eats out there." Wife states that he doesn't drink any supplements, but takes a MVI.   Wife doesn't know about weight loss; pt states that he doesn't weigh himself, and he  doesn't know, couldn't confirm current weight. Per MD notes Pt has lost about 10 pounds in the last 1 month.   Was able to preform a very quick one sided NFPE (right side), pt just wanted to get back to bed and sleep.   Labs reviewed:  Corrected Ca 9.32 (WDL) Tumor Markers:  AFP, serum, marker 1.3 (WDL)  CA19-9 118 (H)  Medications reviewed and include:  Ensure Enlive BID 0.9% Sodium Chloride with KCl 18mEq Infusion 181mL/hr   NUTRITION - FOCUSED PHYSICAL EXAM:    Most Recent Value  Orbital Region  Moderate depletion  Upper Arm Region  Moderate depletion  Thoracic and Lumbar Region  Mild depletion  Buccal Region  Mild depletion  Temple Region  Mild depletion  Clavicle Bone Region  Mild depletion  Clavicle and Acromion Bone Region  Mild depletion  Scapular Bone Region  Mild depletion  Dorsal Hand  Mild depletion  Patellar Region  Moderate depletion  Anterior Thigh Region  Moderate depletion  Posterior Calf Region  Moderate depletion  Edema (RD Assessment)  None  Hair  Reviewed  Eyes  Reviewed  Mouth  Reviewed  Skin  Reviewed [Jaundice, very yellow]  Nails  Reviewed       Diet Order:   Diet Order            Diet regular Room service appropriate? Yes; Fluid consistency: Thin  Diet effective now              EDUCATION NEEDS:   No education needs have been identified at this time  Skin:  Skin Assessment: Reviewed RN Assessment(Jaundice)  Last  BM:  3/16  Height:   Ht Readings from Last 1 Encounters:  09/08/18 5\' 10"  (1.778 m)    Weight:   Wt Readings from Last 1 Encounters:  09/08/18 78.9 kg    Ideal Body Weight:  75.45 kg  BMI:  Body mass index is 24.97 kg/m.  Estimated Nutritional Needs:   Kcal:  2000-2300  Protein:  100-115 grams  Fluid:  2L    Herma Carson, [place] [facility]etic Intern

## 2018-09-09 NOTE — Transfer of Care (Signed)
Immediate Anesthesia Transfer of Care Note  Patient: Ricky Conway  Procedure(s) Performed: ENDOSCOPIC RETROGRADE CHOLANGIOPANCREATOGRAPHY (ERCP) (N/A )  Patient Location: PACU  Anesthesia Type:General  Level of Consciousness: awake, alert , oriented and patient cooperative  Airway & Oxygen Therapy: Patient Spontanous Breathing and Patient connected to nasal cannula oxygen  Post-op Assessment: Report given to RN and Post -op Vital signs reviewed and stable  Post vital signs: Reviewed and stable  Last Vitals:  Vitals Value Taken Time  BP 159/44 09/09/2018 12:46 PM  Temp    Pulse 82 09/09/2018 12:47 PM  Resp 16 09/09/2018 12:47 PM  SpO2 93 % 09/09/2018 12:47 PM  Vitals shown include unvalidated device data.  Last Pain:  Vitals:   09/09/18 1102  TempSrc: Oral  PainSc: 0-No pain         Complications: No apparent anesthesia complications

## 2018-09-10 ENCOUNTER — Inpatient Hospital Stay (HOSPITAL_COMMUNITY): Payer: 59

## 2018-09-10 ENCOUNTER — Encounter (HOSPITAL_COMMUNITY): Payer: Self-pay | Admitting: Gastroenterology

## 2018-09-10 DIAGNOSIS — E440 Moderate protein-calorie malnutrition: Secondary | ICD-10-CM

## 2018-09-10 LAB — HEPATIC FUNCTION PANEL
ALT: 384 U/L — ABNORMAL HIGH (ref 0–44)
AST: 144 U/L — ABNORMAL HIGH (ref 15–41)
Albumin: 3.1 g/dL — ABNORMAL LOW (ref 3.5–5.0)
Alkaline Phosphatase: 304 U/L — ABNORMAL HIGH (ref 38–126)
Bilirubin, Direct: 4.7 mg/dL — ABNORMAL HIGH (ref 0.0–0.2)
Indirect Bilirubin: 4.2 mg/dL — ABNORMAL HIGH (ref 0.3–0.9)
Total Bilirubin: 8.9 mg/dL — ABNORMAL HIGH (ref 0.3–1.2)
Total Protein: 6.6 g/dL (ref 6.5–8.1)

## 2018-09-10 LAB — CEA: CEA: 4350 ng/mL — ABNORMAL HIGH (ref 0.0–4.7)

## 2018-09-10 MED ORDER — HEPARIN SODIUM (PORCINE) 5000 UNIT/ML IJ SOLN: 5000.0000 [IU] | Freq: Three times a day (TID) | INTRAMUSCULAR | Status: DC

## 2018-09-10 MED ORDER — POLYETHYLENE GLYCOL 3350 17 G PO PACK
17.0000 g | PACK | Freq: Two times a day (BID) | ORAL | Status: DC
  Administered 2018-09-10 – 2018-09-11 (×3): 17 g via ORAL
  Filled 2018-09-10 (×3): qty 1

## 2018-09-10 MED ORDER — BISACODYL 10 MG RE SUPP
10.0000 mg | Freq: Once | RECTAL | Status: AC
  Administered 2018-09-10: 10 mg via RECTAL
  Filled 2018-09-10: qty 1

## 2018-09-10 MED ORDER — NICOTINE 21 MG/24HR TD PT24
21.0000 mg | MEDICATED_PATCH | Freq: Every day | TRANSDERMAL | Status: DC
  Administered 2018-09-10 – 2018-09-11 (×2): 21 mg via TRANSDERMAL
  Filled 2018-09-10 (×2): qty 1

## 2018-09-10 MED ORDER — BISACODYL 10 MG RE SUPP: 10.0000 mg | Freq: Once | RECTAL | Status: AC

## 2018-09-10 NOTE — Progress Notes (Signed)
[facility]                                                                              Patient Demographics  Ricky Conway, is a 73 y.o. male, DOB - 03-Mar-1946, XKG:[id number]  Admit date - 09/08/2018   Admitting Physician Lady Deutscher, MD  Outpatient Primary MD for the patient is Mayra Neer, MD  Outpatient specialists:   LOS - 2  days   Medical records reviewed and are as summarized below:    Chief Complaint  Patient presents with   Abdominal Pain       Brief summary   Ricky Conway is a 73 year old Caucasian male, with past medical history significant for hypertension, hyperlipidemia and COPD.  Patient was admitted with painless jaundice, obstructive.  Patient underwent ERCP and stent placement, with CBD brush samples taken for cytopathology.  Cytopathology from the specimen taken from the common bile duct was negative for malignant cells.  Will consult interventional radiology team for tissue biopsy.  Imaging was done revealed mass involving the head of the pancreas and some liver lesions.  Discussed above management with the oncologist, Dr. Alen Blew.   Assessment & Plan    Principal Problem:   Common biliary duct obstruction/pancreatic mass with liver lesions: -This is concerning for possible pancreatic malignancy with mets to the liver. -Patient is status post ERCP and stent placement. -Jaundice has resolved significantly. -Patient feels better. -Cytopathology and the common bile duct was negative for malignancy -We will consult interventional radiology team for possible tissue biopsy. -GI input is highly appreciated.    Active Problems:   Liver masses -Concerning for possible mets, awaiting tissue biopsy -Discussed above findings with the oncologist, Dr. Karle Starch.    Hypertension: -Continue to monitor and optimize.    Hyperlipidemia -Dietary control    COPD, mild (HCC) -Stable, no wheezing  Code Status: full  DVT Prophylaxis:  SCD's Family Communication: Patient's wife  Disposition Plan: Likely DC home after tissue biopsy.  Patient can follow-up with oncology afterwards.  Time Spent in minutes 25 minutes  Procedures:  None  Consultants:   GI Discussed case with oncologist, Dr. Wannetta Sender.  Antimicrobials:   Anti-infectives (From admission, onward)   None         Medications  Scheduled Meds:  feeding supplement (ENSURE ENLIVE)  237 mL Oral BID BM   [START ON 09/11/2018] heparin  5,000 Units Subcutaneous Q8H   nicotine  21 mg Transdermal Daily   polyethylene glycol  17 g Oral BID   sodium chloride flush  3 mL Intravenous Q12H   Continuous Infusions:  PRN Meds:.acetaminophen **OR** acetaminophen, ondansetron **OR** ondansetron (ZOFRAN) IV, oxyCODONE, zolpidem      Subjective:   Jaundice has improved significantly. No new complaints.  Objective:   Vitals:   09/09/18 1620 09/09/18 2106 09/10/18 0108 09/10/18 0519  BP: 133/77 (!) 141/72  (!) 178/83  Pulse: 65 66 63 61  Resp: 18 18  18   Temp: 98 F (36.7 C) 97.9 F (36.6 C)  97.8 F (36.6 C)  TempSrc: Oral Oral  Oral  SpO2: (!) 84% (!) 88% 97% 94%  Weight:  78.9 kg    Height:        Intake/Output Summary (Last 24 hours) at 09/10/2018 1521 Last data filed at 09/10/2018 1051 Gross per 24 hour  Intake 2531.29 ml  Output 0 ml  Net 2531.29 ml     Wt Readings from Last 3 Encounters:  09/09/18 78.9 kg     Exam  General: Alert and oriented x 3, NAD  HEENT: Tinge of jaundice.  Pallor.  Cardiovascular: S1 S2 auscultated.  Respiratory: Clear to auscultation bilaterally.  Gastrointestinal: Soft, mild epigastric tenderness on deep palpation, NBS  Ext: no pedal edema bilaterally Neuro: Patient is awake and alert.  Patient moves all limbs.     Data Reviewed:  I have personally reviewed following labs and imaging studies  Micro Results No results found for this or any previous visit (from the past 240  hour(s)).  Radiology Reports Dg Chest 2 View  Result Date: 09/08/2018 CLINICAL DATA:  Jaundice with biliary obstruction. Symptoms for 1 months. EXAM: CHEST - 2 VIEW COMPARISON:  None. FINDINGS: Cardiomegaly. Thoracic atherosclerosis. Hyperinflation with increased markings suggesting COPD. No consolidation or edema. No effusion or pneumothorax. Bones unremarkable. IMPRESSION: No active cardiopulmonary disease. Electronically Signed   By: Staci Righter M.D.   On: 09/08/2018 15:00   Dg Abd 1 View  Result Date: 09/10/2018 CLINICAL DATA:  Pt having distention and constipation Biliary stent placed yesterday EXAM: ABDOMEN - 1 VIEW COMPARISON:  None. FINDINGS: Stable metallic endoscopic CBD stent. Multiple gas filled nondilated loops of small bowel. Enteric contrast material and moderate fecal material in the proximal colon, which is decompressed distally. Bilateral iliofemoral arterial calcifications. Regional bones unremarkable. IMPRESSION: Nonobstructive bowel gas pattern. Electronically Signed   By: Lucrezia Europe M.D.   On: 09/10/2018 09:17   Dg Ercp Biliary & Pancreatic Ducts  Result Date: 09/09/2018 CLINICAL DATA:  Pancreatic mass, biliary obstruction and liver lesions. EXAM: ERCP TECHNIQUE: Multiple spot images obtained with the fluoroscopic device and submitted for interpretation post-procedure. COMPARISON:  MRI of the abdomen on 09/08/2018 FINDINGS: Images obtained with a C-arm during the endoscopic procedure demonstrate cannulation of the common bile duct with contrast injection demonstrating high-grade stricture and obstruction of the distal common bile duct. There is diffuse dilatation of intrahepatic bile ducts and the proximal CBD. A self expanding metallic biliary stent was placed spanning across the level of obstruction. IMPRESSION: High-grade stricture and obstruction of the distal common bile duct treated with placement of a self expanding metallic biliary stent. These images were submitted for  radiologic interpretation only. Please see the procedural report for the amount of contrast and the fluoroscopy time utilized. Electronically Signed   By: Aletta Edouard M.D.   On: 09/09/2018 14:33   Mr Abdomen Mrcp Moise Boring Contast  Result Date: 09/08/2018 CLINICAL DATA:  Abnormal LFTs, liver lesions ultrasound EXAM: MRI ABDOMEN WITHOUT AND WITH CONTRAST (INCLUDING MRCP) TECHNIQUE: Multiplanar multisequence MR imaging of the abdomen was performed both before and after the administration of intravenous contrast. Heavily T2-weighted images of the biliary and pancreatic ducts were obtained, and three-dimensional MRCP images were rendered by post processing. CONTRAST:  8 mL Gadovist IV COMPARISON:  Right upper quadrant ultrasound dated 09/03/2018. CT abdomen/pelvis dated 07/31/2016. FINDINGS: Motion degraded images, particularly dynamic postcontrast imaging, which is severely degraded. Lower chest: Minimal dependent atelectasis lung bases. Hepatobiliary: Multifocal hepatic lesions/metastases, at least 6 in number, including: --2.7 cm lesion in segment 2 (series 5/image 14) --2.7 cm lesion in segment 7 (series 5/image 14) --Two lesions  in segment 4A measuring 2.8 and 3.5 cm (series 5/image 15) --2.5 cm lesion in segment 6 (series 5/image 35) Distended gallbladder with layering tiny gallstones and gallbladder sludge (series 5/image 36). Intrahepatic and extrahepatic ductal dilatation. Central common duct measures 18 mm. Abrupt narrowing at the level of the mid/distal common duct (series 4/image 31), suggesting extrinsic compression/tumor involvement. Pancreas: 1.9 x 2.6 cm hypoenhancing lesion arising exophytically along the posterior aspect of the pancreatic head (series 1301/image 42), suspicious for pancreatic adenocarcinoma. No pancreatic atrophy or ductal dilatation. Spleen:  Within normal limits. Adrenals/Urinary Tract:  Adrenal glands are within normal limits. Tiny bilateral renal cysts.  No hydronephrosis.  Stomach/Bowel: Stomach is within normal limits. Visualized bowel is unremarkable. Vascular/Lymphatic: No evidence of abdominal aortic aneurysm. Suspected lesion abuts the IVC. Small upper abdominal/retroperitoneal lymph nodes, including a 2.4 cm short axis left para-aortic node (series 3/image 28), poorly visualized. Other:  No abdominal ascites. Musculoskeletal: No focal osseous lesions. IMPRESSION: Limited evaluation due to motion degraded imaging. 1.9 x 2.6 cm hyperenhancing lesion arising along the posterior aspect of the pancreatic head, suspicious for pancreatic adenocarcinoma. Suspected involvement of the distal common duct, with secondary intrahepatic/extrahepatic ductal dilatation. Lesion also abuts the IVC. Multifocal hepatic metastases, with index lesions as above. Small upper abdominal/retroperitoneal lymph nodes, poorly evaluated. Electronically Signed   By: Julian Hy M.D.   On: 09/08/2018 15:41   US Abdomen Limited Ruq  Result Date: 09/03/2018 CLINICAL DATA:  Elevated liver function tests. EXAM: ULTRASOUND ABDOMEN LIMITED RIGHT UPPER QUADRANT COMPARISON:  CT abdomen and pelvis 07/31/2016. FINDINGS: Gallbladder: The gallbladder is completely filled with sludge and mildly distended. No wall thickening or pericholecystic fluid. Sonographer reports negative Murphy's sign. Common bile duct: Diameter: 12 mm. Liver: Three solid lesions with mildly decreased echogenicity are identified. The largest is in the right lobe measuring 2.9 x 3.3 x 3.4 cm. A second lesion in the right lobe measures 2.6 cm in diameter and a lesion in the left lobe measures 1.8 cm in diameter. There is intrahepatic biliary ductal dilatation. Portal vein is patent on color Doppler imaging with normal direction of blood flow towards the liver. IMPRESSION: 3 lesions in the liver are highly suspicious for carcinoma. Intra and extrahepatic biliary ductal dilatation. Cause for obstruction is not identified but constellation of  findings raise the possibility of a pancreatic or ampullary mass. MRI/MRCP of the abdomen with and without contrast is recommended for further evaluation. Distended gallbladder is filled with sludge. Electronically Signed   By: Inge Rise M.D.   On: 09/03/2018 14:18    Lab Data:  CBC: Recent Labs  Lab 09/08/18 1230 09/09/18 0548  WBC 10.6* 9.7  NEUTROABS 8.4*  --   HGB 18.7* 17.2*  HCT 54.6* 49.8  MCV 92.7 91.9  PLT 235 737   Basic Metabolic Panel: Recent Labs  Lab 09/08/18 1230 09/09/18 0548  NA 134* 136  K 4.3 4.0  CL 98 106  CO2 21* 23  GLUCOSE 90 96  BUN 10 8  CREATININE 0.62 0.55*  CALCIUM 9.1 8.2*   GFR: Estimated Creatinine Clearance: 83.6 mL/min (A) (by C-G formula based on SCr of 0.55 mg/dL (L)). Liver Function Tests: Recent Labs  Lab 09/08/18 1230 09/09/18 0548 09/10/18 0759  AST 229* 186* 144*  ALT 516* 412* 384*  ALKPHOS 300* 269* 304*  BILITOT 18.3* 16.5* 8.9*  PROT 6.4* 5.5* 6.6  ALBUMIN 3.1* 2.6* 3.1*   Recent Labs  Lab 09/08/18 1230  LIPASE 28   Recent Labs  Lab 09/08/18 1230  AMMONIA 32   Coagulation Profile: Recent Labs  Lab 09/08/18 1230  INR 1.1   Cardiac Enzymes: No results for input(s): CKTOTAL, CKMB, CKMBINDEX, TROPONINI in the last 168 hours. BNP (last 3 results) No results for input(s): PROBNP in the last 8760 hours. HbA1C: No results for input(s): HGBA1C in the last 72 hours. CBG: No results for input(s): GLUCAP in the last 168 hours. Lipid Profile: No results for input(s): CHOL, HDL, LDLCALC, TRIG, CHOLHDL, LDLDIRECT in the last 72 hours. Thyroid Function Tests: No results for input(s): TSH, T4TOTAL, FREET4, T3FREE, THYROIDAB in the last 72 hours. Anemia Panel: No results for input(s): VITAMINB12, FOLATE, FERRITIN, TIBC, IRON, RETICCTPCT in the last 72 hours. Urine analysis: No results found for: COLORURINE, APPEARANCEUR, LABSPEC, PHURINE, GLUCOSEU, HGBUR, BILIRUBINUR, KETONESUR, PROTEINUR, UROBILINOGEN,  NITRITE, LEUKOCYTESUR   Bonnell Public M.D. [facility] 09/10/2018, 3:21 PM  Pager: [phone]  After 7pm go to [email or web address] - password [email or web address]  Call night coverage person covering after 7pm

## 2018-09-10 NOTE — Progress Notes (Signed)
Subjective: Patient was seen and examined at bedside. He complains of upper abdominal discomfort and bloating postprocedure, he thinks may be related to constipation and is requesting for a laxative.  He denies nausea and was able to tolerate soft diet yesterday.  Objective: Vital signs in last 24 hours: Temp:  [97.5 F (36.4 C)-98.3 F (36.8 C)] 97.8 F (36.6 C) (03/18 0519) Pulse Rate:  [61-84] 61 (03/18 0519) Resp:  [14-20] 18 (03/18 0519) BP: (133-183)/(44-83) 178/83 (03/18 0519) SpO2:  [84 %-97 %] 94 % (03/18 0519) Weight:  [78.9 kg] 78.9 kg (03/17 2106) Weight change: -0.026 kg Last BM Date: 09/09/18  PE: Less icteric than yesterday GENERAL:Not in distress ABDOMEN: Slightly distended, nontender, sluggish bowel sounds EXTREMITIES: No deformity  Lab Results: Results for orders placed or performed during the hospital encounter of 09/08/18 (from the past 48 hour(s))  CBC with Differential     Status: Abnormal   Collection Time: 09/08/18 12:30 PM  Result Value Ref Range   WBC 10.6 (H) 4.0 - 10.5 K/uL   RBC 5.89 (H) 4.22 - 5.81 MIL/uL   Hemoglobin 18.7 (H) 13.0 - 17.0 g/dL   HCT 54.6 (H) 39.0 - 52.0 %   MCV 92.7 80.0 - 100.0 fL   MCH 31.7 26.0 - 34.0 pg   MCHC 34.2 30.0 - 36.0 g/dL   RDW 14.6 11.5 - 15.5 %   Platelets 235 150 - 400 K/uL   nRBC 0.0 0.0 - 0.2 %   Neutrophils Relative % 78 %   Neutro Abs 8.4 (H) 1.7 - 7.7 K/uL   Lymphocytes Relative 7 %   Lymphs Abs 0.7 0.7 - 4.0 K/uL   Monocytes Relative 12 %   Monocytes Absolute 1.2 (H) 0.1 - 1.0 K/uL   Eosinophils Relative 1 %   Eosinophils Absolute 0.2 0.0 - 0.5 K/uL   Basophils Relative 1 %   Basophils Absolute 0.1 0.0 - 0.1 K/uL   Immature Granulocytes 1 %   Abs Immature Granulocytes 0.06 0.00 - 0.07 K/uL    Comment: Performed at [facility], [address], [place], [place] [address]  Comprehensive metabolic panel     Status: Abnormal   Collection Time: 09/08/18 12:30 PM  Result Value Ref Range    Sodium 134 (L) 135 - 145 mmol/L   Potassium 4.3 3.5 - 5.1 mmol/L   Chloride 98 98 - 111 mmol/L   CO2 21 (L) 22 - 32 mmol/L   Glucose, Bld 90 70 - 99 mg/dL   BUN 10 8 - 23 mg/dL   Creatinine, Ser 0.62 0.61 - 1.24 mg/dL   Calcium 9.1 8.9 - 10.3 mg/dL   Total Protein 6.4 (L) 6.5 - 8.1 g/dL   Albumin 3.1 (L) 3.5 - 5.0 g/dL   AST 229 (H) 15 - 41 U/L   ALT 516 (H) 0 - 44 U/L   Alkaline Phosphatase 300 (H) 38 - 126 U/L   Total Bilirubin 18.3 (HH) 0.3 - 1.2 mg/dL    Comment: CRITICAL RESULT CALLED TO, READ BACK BY AND VERIFIED WITH: ALLISA MURRAY PA 1345 [id number] BY S. CLARK    GFR calc non Af Amer >60 >60 mL/min   GFR calc Af Amer >60 >60 mL/min   Anion gap 15 5 - 15    Comment: Performed at [facility], [place] [address], [place], [place] [address]  Protime-INR     Status: None   Collection Time: 09/08/18 12:30 PM  Result Value Ref Range  Prothrombin Time 14.2 11.4 - 15.2 seconds   INR 1.1 0.8 - 1.2    Comment: (NOTE) INR goal varies based on device and disease states. Performed at [facility], [place] [address], [place], [place] [address]   Ammonia     Status: None   Collection Time: 09/08/18 12:30 PM  Result Value Ref Range   Ammonia 32 9 - 35 umol/L    Comment: Performed at [facility], [place] [address], [place], [place] [address]  Lipase, blood     Status: None   Collection Time: 09/08/18 12:30 PM  Result Value Ref Range   Lipase 28 11 - 51 U/L    Comment: Performed at [place] [address], [place], [place] [address]  Hepatitis panel, acute     Status: None   Collection Time: 09/08/18 12:30 PM  Result Value Ref Range   Hepatitis B Surface Ag Negative Negative   HCV Ab <0.1 0.0 - 0.9 s/co ratio    Comment: (NOTE)                                  Negative:     < 0.8                             Indeterminate: 0.8 - 0.9                                  Positive:     > 0.9 The CDC recommends that a positive HCV antibody result be followed up  with a HCV Nucleic Acid Amplification test (403474). Performed At: [facility] [place], [place] [id number] Rush Farmer MD [id number]    Hep A IgM Negative Negative   Hep B C IgM Negative Negative  AFP tumor marker     Status: None   Collection Time: 09/08/18  4:59 PM  Result Value Ref Range   AFP, Serum, Tumor Marker 1.3 0.0 - 8.3 ng/mL    Comment: (NOTE) [employer] Electrochemiluminescence Immunoassay (ECLIA) Values obtained with different assay methods or kits cannot be used interchangeably.  Results cannot be interpreted as absolute evidence of the presence or absence of malignant disease. This test is not interpretable in pregnant females. Performed At: [facility] [place], [place] [id number] Rush Farmer MD FU:[id number]   Cancer antigen 19-9     Status: Abnormal   Collection Time: 09/08/18  4:59 PM  Result Value Ref Range   CA 19-9 118 (H) 0 - 35 U/mL    Comment: (NOTE) [employer] Electrochemiluminescence Immunoassay (ECLIA) Values obtained with different assay methods or kits cannot be used interchangeably.  Results cannot be interpreted as absolute evidence of the presence or absence of malignant disease. Performed At: [facility] [place], [place] [id number] Rush Farmer MD BJ:[id number]   Comprehensive metabolic panel     Status: Abnormal   Collection Time: 09/09/18  5:48 AM  Result Value Ref Range   Sodium 136 135 - 145 mmol/L   Potassium 4.0 3.5 - 5.1 mmol/L   Chloride 106 98 - 111 mmol/L   CO2 23 22 - 32 mmol/L   Glucose, Bld 96 70 - 99 mg/dL   BUN 8 8 - 23 mg/dL   Creatinine,  Ser 0.55 (L) 0.61 - 1.24 mg/dL   Calcium 8.2 (L) 8.9 - 10.3 mg/dL   Total Protein 5.5 (L) 6.5 - 8.1 g/dL   Albumin 2.6 (L) 3.5 - 5.0 g/dL   AST 186 (H) 15 - 41 U/L   ALT 412 (H) 0 - 44 U/L   Alkaline Phosphatase 269 (H) 38 - 126 U/L   Total Bilirubin 16.5 (H) 0.3 - 1.2 mg/dL   GFR calc  non Af Amer >60 >60 mL/min   GFR calc Af Amer >60 >60 mL/min   Anion gap 7 5 - 15    Comment: Performed at [facility], [place] [address] [place], [place] [address]  CBC     Status: Abnormal   Collection Time: 09/09/18  5:48 AM  Result Value Ref Range   WBC 9.7 4.0 - 10.5 K/uL   RBC 5.42 4.22 - 5.81 MIL/uL   Hemoglobin 17.2 (H) 13.0 - 17.0 g/dL   HCT 49.8 39.0 - 52.0 %   MCV 91.9 80.0 - 100.0 fL   MCH 31.7 26.0 - 34.0 pg   MCHC 34.5 30.0 - 36.0 g/dL   RDW 14.9 11.5 - 15.5 %   Platelets 221 150 - 400 K/uL   nRBC 0.0 0.0 - 0.2 %    Comment: Performed at [facility], [place] [address], [place], [place] [address]    Studies/Results: Dg Chest 2 View  Result Date: 09/08/2018 CLINICAL DATA:  Jaundice with biliary obstruction. Symptoms for 1 months. EXAM: CHEST - 2 VIEW COMPARISON:  None. FINDINGS: Cardiomegaly. Thoracic atherosclerosis. Hyperinflation with increased markings suggesting COPD. No consolidation or edema. No effusion or pneumothorax. Bones unremarkable. IMPRESSION: No active cardiopulmonary disease. Electronically Signed   By: Staci Righter M.D.   On: 09/08/2018 15:00   Dg Ercp Biliary & Pancreatic Ducts  Result Date: 09/09/2018 CLINICAL DATA:  Pancreatic mass, biliary obstruction and liver lesions. EXAM: ERCP TECHNIQUE: Multiple spot images obtained with the fluoroscopic device and submitted for interpretation post-procedure. COMPARISON:  MRI of the abdomen on 09/08/2018 FINDINGS: Images obtained with a C-arm during the endoscopic procedure demonstrate cannulation of the common bile duct with contrast injection demonstrating high-grade stricture and obstruction of the distal common bile duct. There is diffuse dilatation of intrahepatic bile ducts and the proximal CBD. A self expanding metallic biliary stent was placed spanning across the level of obstruction. IMPRESSION: High-grade stricture and obstruction of the distal common bile duct treated with placement of a self  expanding metallic biliary stent. These images were submitted for radiologic interpretation only. Please see the procedural report for the amount of contrast and the fluoroscopy time utilized. Electronically Signed   By: Aletta Edouard M.D.   On: 09/09/2018 14:33   Mr Abdomen Mrcp Moise Boring Contast  Result Date: 09/08/2018 CLINICAL DATA:  Abnormal LFTs, liver lesions ultrasound EXAM: MRI ABDOMEN WITHOUT AND WITH CONTRAST (INCLUDING MRCP) TECHNIQUE: Multiplanar multisequence MR imaging of the abdomen was performed both before and after the administration of intravenous contrast. Heavily T2-weighted images of the biliary and pancreatic ducts were obtained, and three-dimensional MRCP images were rendered by post processing. CONTRAST:  8 mL Gadovist IV COMPARISON:  Right upper quadrant ultrasound dated 09/03/2018. CT abdomen/pelvis dated 07/31/2016. FINDINGS: Motion degraded images, particularly dynamic postcontrast imaging, which is severely degraded. Lower chest: Minimal dependent atelectasis lung bases. Hepatobiliary: Multifocal hepatic lesions/metastases, at least 6 in number, including: --2.7 cm lesion in segment 2 (series 5/image 14) --2.7 cm lesion in segment 7 (series  5/image 14) --Two lesions in segment 4A measuring 2.8 and 3.5 cm (series 5/image 15) --2.5 cm lesion in segment 6 (series 5/image 35) Distended gallbladder with layering tiny gallstones and gallbladder sludge (series 5/image 36). Intrahepatic and extrahepatic ductal dilatation. Central common duct measures 18 mm. Abrupt narrowing at the level of the mid/distal common duct (series 4/image 31), suggesting extrinsic compression/tumor involvement. Pancreas: 1.9 x 2.6 cm hypoenhancing lesion arising exophytically along the posterior aspect of the pancreatic head (series 1301/image 42), suspicious for pancreatic adenocarcinoma. No pancreatic atrophy or ductal dilatation. Spleen:  Within normal limits. Adrenals/Urinary Tract:  Adrenal glands are within  normal limits. Tiny bilateral renal cysts.  No hydronephrosis. Stomach/Bowel: Stomach is within normal limits. Visualized bowel is unremarkable. Vascular/Lymphatic: No evidence of abdominal aortic aneurysm. Suspected lesion abuts the IVC. Small upper abdominal/retroperitoneal lymph nodes, including a 2.4 cm short axis left para-aortic node (series 3/image 28), poorly visualized. Other:  No abdominal ascites. Musculoskeletal: No focal osseous lesions. IMPRESSION: Limited evaluation due to motion degraded imaging. 1.9 x 2.6 cm hyperenhancing lesion arising along the posterior aspect of the pancreatic head, suspicious for pancreatic adenocarcinoma. Suspected involvement of the distal common duct, with secondary intrahepatic/extrahepatic ductal dilatation. Lesion also abuts the IVC. Multifocal hepatic metastases, with index lesions as above. Small upper abdominal/retroperitoneal lymph nodes, poorly evaluated. Electronically Signed   By: Julian Hy M.D.   On: 09/08/2018 15:41    Medications: I have reviewed the patient's current medications.  Assessment: Pancreatic mass with multiple liver lesions suspicious for metastatic pancreatic cancer, weighted CEA 19 9 Distal CBD obstruction likely from extrinsic compression from pancreatic head mass, treated with ERCP, sphincterotomy and metal stent placement yesterday Cytology pending Constipation  Plan: If cytology brushing is unremarkable recommend proceeding with IR guided liver biopsy for tissue diagnosis Oncology evaluation Abdominal x-ray today as patient complains of bloating and constipation Will give 1 rectal suppository today morning   Ronnette Juniper 09/10/2018, 8:08 AM   Pager [phone] If no answer or after 5 PM call [phone]

## 2018-09-10 NOTE — Consult Note (Signed)
Chief Complaint: Patient was seen in consultation today for liver lesion  Referring Physician(s): Dr. Therisa Doyne  Supervising Physician: Jacqulynn Cadet  Patient Status: [facility] - In-pt  History of Present Illness: Ricky Conway is a 73 y.o. male with past medical history of abdominal pain, distention who presented to [facility] ED with an bowel obstruction. He was found to have a bilirubin of 18, Alk phos 300 suggestive of bile duct obstruction.    MR Abdomen 09/08/18: 1.9 x 2.6 cm hyperenhancing lesion arising along the posterior aspect of the pancreatic head, suspicious for pancreatic adenocarcinoma. Suspected involvement of the distal common duct, with secondary intrahepatic/extrahepatic ductal dilatation. Lesion also abuts the IVC. Multifocal hepatic metastases, with index lesions as above. Small upper abdominal/retroperitoneal lymph nodes, poorly evaluated.  He is s/p ERCP this AM with bile duct brushings and stent placement.  Request now made for liver lesion biopsy.  Imaging reviewed by Dr. Laurence Ferrari who approves patient for procedure.  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/08/2018   COPD (chronic obstructive pulmonary disease) (HCC)    Hyperlipidemia    Hypertension     Past Surgical History:  Procedure Laterality Date   FOOT SURGERY     HERNIA REPAIR     WITH MESH    Allergies: Patient has no known allergies.  Medications: Prior to Admission medications   Medication Sig Start Date End Date Taking? Authorizing Provider  amLODipine (NORVASC) 10 MG tablet Take 10 mg by mouth daily. 07/03/18  Yes [provider]  furosemide (LASIX) 20 MG tablet Take 20 mg by mouth daily. 06/29/18  Yes [provider]  lisinopril (PRINIVIL,ZESTRIL) 40 MG tablet Take 40 mg by mouth daily. 06/24/18  Yes [provider]  simvastatin (ZOCOR) 40 MG tablet Take 40 mg by mouth daily at 6 PM. 06/08/18  Yes [provider]     Family History  Problem Relation Age of Onset   Diabetes Neg Hx    CAD Neg Hx    Cancer Neg Hx     Social History   Socioeconomic History   Marital status: Married    Spouse name: Not on file   Number of children: Not on file   Years of education: Not on file   Highest education level: Not on file  Occupational History   Not on file  Social Needs   Financial resource strain: Not on file   Food insecurity:    Worry: Not on file    Inability: Not on file   Transportation needs:    Medical: Not on file    Non-medical: Not on file  Tobacco Use   Smoking status: Heavy Tobacco Smoker    Packs/day: 1.00    Years: 60.00    Pack years: 60.00    Types: Cigarettes   Smokeless tobacco: Never Used   Tobacco comment: Not currently ready to quit  Substance and Sexual Activity   Alcohol use: Yes    Alcohol/week: 0.0 standard drinks    Comment: OCCASIONAL   Drug use: Never   Sexual activity: Not on file  Lifestyle   Physical activity:    Days per week: Not on file    Minutes per session: Not on file   Stress: Not on file  Relationships   Social connections:    Talks on phone: Not on file    Gets together: Not on file    Attends religious service: Not on file  Active member of club or organization: Not on file    Attends meetings of clubs or organizations: Not on file    Relationship status: Not on file  Other Topics Concern   Not on file  Social History Narrative   Not on file     Review of Systems: A 12 point ROS discussed and pertinent positives are indicated in the HPI above.  All other systems are negative.  Review of Systems  Constitutional: Negative for fatigue and fever.  Respiratory: Negative for cough and shortness of breath.   Cardiovascular: Negative for chest pain.  Gastrointestinal: Positive for abdominal distention and abdominal pain.  Musculoskeletal: Negative for back pain.  Psychiatric/Behavioral: Negative for  behavioral problems and confusion.    Vital Signs: BP (!) 178/83 (BP Location: Left Arm)    Pulse 61    Temp 97.8 F (36.6 C) (Oral)    Resp 18    Ht '5\' 10"'  (1.778 m)    Wt 173 lb 15.1 oz (78.9 kg)    SpO2 94%    BMI 24.96 kg/m   Physical Exam Vitals signs and nursing note reviewed.  Constitutional:      Appearance: He is well-developed.  Cardiovascular:     Rate and Rhythm: Normal rate and regular rhythm.  Pulmonary:     Effort: Pulmonary effort is normal. No respiratory distress.     Breath sounds: Normal breath sounds.  Abdominal:     General: There is distension.     Tenderness: There is no abdominal tenderness.  Skin:    General: Skin is warm and dry.  Neurological:     General: No focal deficit present.     Mental Status: He is alert. He is disoriented.  Psychiatric:        Mood and Affect: Mood normal. Mood is not anxious or depressed.        Behavior: Behavior normal.     MD Evaluation Airway: WNL Heart: WNL Abdomen: WNL Chest/ Lungs: WNL ASA  Classification: 3 Mallampati/Airway Score: One   Imaging: Dg Chest 2 View  Result Date: 09/08/2018 CLINICAL DATA:  Jaundice with biliary obstruction. Symptoms for 1 months. EXAM: CHEST - 2 VIEW COMPARISON:  None. FINDINGS: Cardiomegaly. Thoracic atherosclerosis. Hyperinflation with increased markings suggesting COPD. No consolidation or edema. No effusion or pneumothorax. Bones unremarkable. IMPRESSION: No active cardiopulmonary disease. Electronically Signed   By: Staci Righter M.D.   On: 09/08/2018 15:00   Dg Abd 1 View  Result Date: 09/10/2018 CLINICAL DATA:  Pt having distention and constipation Biliary stent placed yesterday EXAM: ABDOMEN - 1 VIEW COMPARISON:  None. FINDINGS: Stable metallic endoscopic CBD stent. Multiple gas filled nondilated loops of small bowel. Enteric contrast material and moderate fecal material in the proximal colon, which is decompressed distally. Bilateral iliofemoral arterial calcifications.  Regional bones unremarkable. IMPRESSION: Nonobstructive bowel gas pattern. Electronically Signed   By: Lucrezia Europe M.D.   On: 09/10/2018 09:17   Dg Ercp Biliary & Pancreatic Ducts  Result Date: 09/09/2018 CLINICAL DATA:  Pancreatic mass, biliary obstruction and liver lesions. EXAM: ERCP TECHNIQUE: Multiple spot images obtained with the fluoroscopic device and submitted for interpretation post-procedure. COMPARISON:  MRI of the abdomen on 09/08/2018 FINDINGS: Images obtained with a C-arm during the endoscopic procedure demonstrate cannulation of the common bile duct with contrast injection demonstrating high-grade stricture and obstruction of the distal common bile duct. There is diffuse dilatation of intrahepatic bile ducts and the proximal CBD. A self expanding metallic biliary stent was  placed spanning across the level of obstruction. IMPRESSION: High-grade stricture and obstruction of the distal common bile duct treated with placement of a self expanding metallic biliary stent. These images were submitted for radiologic interpretation only. Please see the procedural report for the amount of contrast and the fluoroscopy time utilized. Electronically Signed   By: Aletta Edouard M.D.   On: 09/09/2018 14:33   Mr Abdomen Mrcp Moise Boring Contast  Result Date: 09/08/2018 CLINICAL DATA:  Abnormal LFTs, liver lesions ultrasound EXAM: MRI ABDOMEN WITHOUT AND WITH CONTRAST (INCLUDING MRCP) TECHNIQUE: Multiplanar multisequence MR imaging of the abdomen was performed both before and after the administration of intravenous contrast. Heavily T2-weighted images of the biliary and pancreatic ducts were obtained, and three-dimensional MRCP images were rendered by post processing. CONTRAST:  8 mL Gadovist IV COMPARISON:  Right upper quadrant ultrasound dated 09/03/2018. CT abdomen/pelvis dated 07/31/2016. FINDINGS: Motion degraded images, particularly dynamic postcontrast imaging, which is severely degraded. Lower chest: Minimal  dependent atelectasis lung bases. Hepatobiliary: Multifocal hepatic lesions/metastases, at least 6 in number, including: --2.7 cm lesion in segment 2 (series 5/image 14) --2.7 cm lesion in segment 7 (series 5/image 14) --Two lesions in segment 4A measuring 2.8 and 3.5 cm (series 5/image 15) --2.5 cm lesion in segment 6 (series 5/image 35) Distended gallbladder with layering tiny gallstones and gallbladder sludge (series 5/image 36). Intrahepatic and extrahepatic ductal dilatation. Central common duct measures 18 mm. Abrupt narrowing at the level of the mid/distal common duct (series 4/image 31), suggesting extrinsic compression/tumor involvement. Pancreas: 1.9 x 2.6 cm hypoenhancing lesion arising exophytically along the posterior aspect of the pancreatic head (series 1301/image 42), suspicious for pancreatic adenocarcinoma. No pancreatic atrophy or ductal dilatation. Spleen:  Within normal limits. Adrenals/Urinary Tract:  Adrenal glands are within normal limits. Tiny bilateral renal cysts.  No hydronephrosis. Stomach/Bowel: Stomach is within normal limits. Visualized bowel is unremarkable. Vascular/Lymphatic: No evidence of abdominal aortic aneurysm. Suspected lesion abuts the IVC. Small upper abdominal/retroperitoneal lymph nodes, including a 2.4 cm short axis left para-aortic node (series 3/image 28), poorly visualized. Other:  No abdominal ascites. Musculoskeletal: No focal osseous lesions. IMPRESSION: Limited evaluation due to motion degraded imaging. 1.9 x 2.6 cm hyperenhancing lesion arising along the posterior aspect of the pancreatic head, suspicious for pancreatic adenocarcinoma. Suspected involvement of the distal common duct, with secondary intrahepatic/extrahepatic ductal dilatation. Lesion also abuts the IVC. Multifocal hepatic metastases, with index lesions as above. Small upper abdominal/retroperitoneal lymph nodes, poorly evaluated. Electronically Signed   By: Julian Hy M.D.   On: 09/08/2018  15:41   US Abdomen Limited Ruq  Result Date: 09/03/2018 CLINICAL DATA:  Elevated liver function tests. EXAM: ULTRASOUND ABDOMEN LIMITED RIGHT UPPER QUADRANT COMPARISON:  CT abdomen and pelvis 07/31/2016. FINDINGS: Gallbladder: The gallbladder is completely filled with sludge and mildly distended. No wall thickening or pericholecystic fluid. Sonographer reports negative Murphy's sign. Common bile duct: Diameter: 12 mm. Liver: Three solid lesions with mildly decreased echogenicity are identified. The largest is in the right lobe measuring 2.9 x 3.3 x 3.4 cm. A second lesion in the right lobe measures 2.6 cm in diameter and a lesion in the left lobe measures 1.8 cm in diameter. There is intrahepatic biliary ductal dilatation. Portal vein is patent on color Doppler imaging with normal direction of blood flow towards the liver. IMPRESSION: 3 lesions in the liver are highly suspicious for carcinoma. Intra and extrahepatic biliary ductal dilatation. Cause for obstruction is not identified but constellation of findings raise the possibility of a  pancreatic or ampullary mass. MRI/MRCP of the abdomen with and without contrast is recommended for further evaluation. Distended gallbladder is filled with sludge. Electronically Signed   By: Inge Rise M.D.   On: 09/03/2018 14:18    Labs:  CBC: Recent Labs    09/08/18 1230 09/09/18 0548  WBC 10.6* 9.7  HGB 18.7* 17.2*  HCT 54.6* 49.8  PLT 235 221    COAGS: Recent Labs    09/08/18 1230  INR 1.1    BMP: Recent Labs    09/08/18 1230 09/09/18 0548  NA 134* 136  K 4.3 4.0  CL 98 106  CO2 21* 23  GLUCOSE 90 96  BUN 10 8  CALCIUM 9.1 8.2*  CREATININE 0.62 0.55*  GFRNONAA >60 >60  GFRAA >60 >60    LIVER FUNCTION TESTS: Recent Labs    09/08/18 1230 09/09/18 0548 09/10/18 0759  BILITOT 18.3* 16.5* 8.9*  AST 229* 186* 144*  ALT 516* 412* 384*  ALKPHOS 300* 269* 304*  PROT 6.4* 5.5* 6.6  ALBUMIN 3.1* 2.6* 3.1*    TUMOR  MARKERS: No results for input(s): AFPTM, CEA, CA199, CHROMGRNA in the last 8760 hours.  Assessment and Plan: Pancreatic mass with multiple liver lesions suspicious for metastatic pancreatic cancer Patient with abdominal distention.  Imaging showed pancreatic head mass with multiple liver lesions.  He underwent ERCP yesterday.  Bile duct brushings returned with no malignant cells. IR now consulted for liver lesion biopsy at the request of Dr. Therisa Doyne.  Plan for biopsy tomorrow as schedule allows.  NPO after MN.   Thank you for this interesting consult.  I greatly enjoyed meeting Ricky Conway and look forward to participating in their care.  A copy of this report was sent to the requesting provider on this date.  Electronically Signed: Docia Barrier, PA 09/10/2018, 4:12 PM   I spent a total of 40 Minutes    in face to face in clinical consultation, greater than 50% of which was counseling/coordinating care for liver lesion.

## 2018-09-11 ENCOUNTER — Inpatient Hospital Stay (HOSPITAL_COMMUNITY): Payer: 59

## 2018-09-11 MED ORDER — FENTANYL CITRATE (PF) 100 MCG/2ML IJ SOLN
INTRAMUSCULAR | Status: AC
  Filled 2018-09-11: qty 2

## 2018-09-11 MED ORDER — FENTANYL CITRATE (PF) 100 MCG/2ML IJ SOLN
INTRAMUSCULAR | Status: AC | PRN
  Administered 2018-09-11: 25 ug via INTRAVENOUS

## 2018-09-11 MED ORDER — MIDAZOLAM HCL 2 MG/2ML IJ SOLN
INTRAMUSCULAR | Status: AC
  Filled 2018-09-11: qty 2

## 2018-09-11 MED ORDER — LIDOCAINE HCL (PF) 1 % IJ SOLN
INTRAMUSCULAR | Status: AC
  Filled 2018-09-11: qty 30

## 2018-09-11 MED ORDER — GELATIN ABSORBABLE 12-7 MM EX MISC
CUTANEOUS | Status: AC
  Filled 2018-09-11: qty 1

## 2018-09-11 MED ORDER — MIDAZOLAM HCL 2 MG/2ML IJ SOLN
INTRAMUSCULAR | Status: AC | PRN
  Administered 2018-09-11: 1 mg via INTRAVENOUS

## 2018-09-11 NOTE — Procedures (Signed)
  Procedure: [place] core R liver lesion  18g x3 EBL:   minimal Complications:  none immediate  See full dictation in [facility].  Dillard Cannon MD Main # [phone] Pager  [phone]

## 2018-09-11 NOTE — Discharge Summary (Signed)
Physician Discharge Summary  Ricky Conway NID:[id number] DOB: 1945-11-12 DOA: 09/08/2018  PCP: Mayra Neer, MD  Admit date: 09/08/2018 Discharge date: 09/11/2018  Admitted From: Home  disposition: Home Recommendations for Outpatient Follow-up:  1. Follow up with PCP in 1-2 weeks 2. Please obtain BMP/CBC in one week 3. Please follow up with Dr. Learta Codding oncology for the biopsy results and further treatment  Home Health none  equipment/Devices: None Discharge Condition: Stable and improved CODE STATUS: Full code  diet recommendation: Regular diet Brief/Interim Summary: 73 year old Caucasian male, with past medical history significant for hypertension, hyperlipidemia and COPD.  Patient was admitted with painless jaundice, obstructive.  Patient underwent ERCP and stent placement, with CBD brush samples taken for cytopathology.  Cytopathology from the specimen taken from the common bile duct was negative for malignant cells.  Will consult interventional radiology team for tissue biopsy.  Imaging was done revealed mass involving the head of the pancreas and some liver lesions.  Discussed above management with the oncologist, Dr. Alen Blew. Discharge Diagnoses:  Principal Problem:   Common biliary duct obstruction Active Problems:   Liver masses   Hypertension   Hyperlipidemia   COPD, mild (HCC)   Malnutrition of moderate degree   Common biliary duct obstruction/pancreatic mass with liver lesions: -This is concerning for possible pancreatic malignancy with mets to the liver. -Patient is status post ERCP and stent placement. -Jaundice has resolved significantly. -Patient feels better. -Cytopathology and the common bile duct was negative for malignancy Patient had liver biopsy done 09/11/2018 -He will be discharged home today to follow-up with oncology as an outpatient.  Active Problems:   Liver masses -Concerning for possible mets, awaiting tissue biopsy   Hypertension: Stable     Hyperlipidemia -Dietary control    COPD, mild (HCC) -Stable, no wheezing     Nutrition Problem: Moderate Malnutrition Etiology: chronic illness(Common Biliary Duct Obstruction, possible pancreatic cancer/metastases)    Signs/Symptoms: moderate muscle depletion, moderate fat depletion     Interventions: Ensure Enlive (each supplement provides 350kcal and 20 grams of protein)  Estimated body mass index is 24.96 kg/m as calculated from the following:   Height as of this encounter: 5\' 10"  (1.778 m).   Weight as of this encounter: 78.9 kg.  Discharge Instructions   Allergies as of 09/11/2018   No Known Allergies     Medication List    TAKE these medications   amLODipine 10 MG tablet Commonly known as:  NORVASC Take 10 mg by mouth daily.   furosemide 20 MG tablet Commonly known as:  LASIX Take 20 mg by mouth daily.   lisinopril 40 MG tablet Commonly known as:  PRINIVIL,ZESTRIL Take 40 mg by mouth daily.   simvastatin 40 MG tablet Commonly known as:  ZOCOR Take 40 mg by mouth daily at 6 PM.      Follow-up Information    Mayra Neer, MD Follow up.   Specialty:  Family Medicine Contact information: [address] [place] [address] [phone]          No Known Allergies  Consultations: GI and interventional radiology  Procedures/Studies: Dg Chest 2 View  Result Date: 09/08/2018 CLINICAL DATA:  Jaundice with biliary obstruction. Symptoms for 1 months. EXAM: CHEST - 2 VIEW COMPARISON:  None. FINDINGS: Cardiomegaly. Thoracic atherosclerosis. Hyperinflation with increased markings suggesting COPD. No consolidation or edema. No effusion or pneumothorax. Bones unremarkable. IMPRESSION: No active cardiopulmonary disease. Electronically Signed   By: Staci Righter M.D.   On: 09/08/2018 15:00  Dg Abd 1 View  Result Date: 09/10/2018 CLINICAL DATA:  Pt having distention and constipation Biliary stent placed yesterday EXAM: ABDOMEN - 1  VIEW COMPARISON:  None. FINDINGS: Stable metallic endoscopic CBD stent. Multiple gas filled nondilated loops of small bowel. Enteric contrast material and moderate fecal material in the proximal colon, which is decompressed distally. Bilateral iliofemoral arterial calcifications. Regional bones unremarkable. IMPRESSION: Nonobstructive bowel gas pattern. Electronically Signed   By: Lucrezia Europe M.D.   On: 09/10/2018 09:17   Dg Ercp Biliary & Pancreatic Ducts  Result Date: 09/09/2018 CLINICAL DATA:  Pancreatic mass, biliary obstruction and liver lesions. EXAM: ERCP TECHNIQUE: Multiple spot images obtained with the fluoroscopic device and submitted for interpretation post-procedure. COMPARISON:  MRI of the abdomen on 09/08/2018 FINDINGS: Images obtained with a C-arm during the endoscopic procedure demonstrate cannulation of the common bile duct with contrast injection demonstrating high-grade stricture and obstruction of the distal common bile duct. There is diffuse dilatation of intrahepatic bile ducts and the proximal CBD. A self expanding metallic biliary stent was placed spanning across the level of obstruction. IMPRESSION: High-grade stricture and obstruction of the distal common bile duct treated with placement of a self expanding metallic biliary stent. These images were submitted for radiologic interpretation only. Please see the procedural report for the amount of contrast and the fluoroscopy time utilized. Electronically Signed   By: Aletta Edouard M.D.   On: 09/09/2018 14:33   Mr Abdomen Mrcp Moise Boring Contast  Result Date: 09/08/2018 CLINICAL DATA:  Abnormal LFTs, liver lesions ultrasound EXAM: MRI ABDOMEN WITHOUT AND WITH CONTRAST (INCLUDING MRCP) TECHNIQUE: Multiplanar multisequence MR imaging of the abdomen was performed both before and after the administration of intravenous contrast. Heavily T2-weighted images of the biliary and pancreatic ducts were obtained, and three-dimensional MRCP images were  rendered by post processing. CONTRAST:  8 mL Gadovist IV COMPARISON:  Right upper quadrant ultrasound dated 09/03/2018. CT abdomen/pelvis dated 07/31/2016. FINDINGS: Motion degraded images, particularly dynamic postcontrast imaging, which is severely degraded. Lower chest: Minimal dependent atelectasis lung bases. Hepatobiliary: Multifocal hepatic lesions/metastases, at least 6 in number, including: --2.7 cm lesion in segment 2 (series 5/image 14) --2.7 cm lesion in segment 7 (series 5/image 14) --Two lesions in segment 4A measuring 2.8 and 3.5 cm (series 5/image 15) --2.5 cm lesion in segment 6 (series 5/image 35) Distended gallbladder with layering tiny gallstones and gallbladder sludge (series 5/image 36). Intrahepatic and extrahepatic ductal dilatation. Central common duct measures 18 mm. Abrupt narrowing at the level of the mid/distal common duct (series 4/image 31), suggesting extrinsic compression/tumor involvement. Pancreas: 1.9 x 2.6 cm hypoenhancing lesion arising exophytically along the posterior aspect of the pancreatic head (series 1301/image 42), suspicious for pancreatic adenocarcinoma. No pancreatic atrophy or ductal dilatation. Spleen:  Within normal limits. Adrenals/Urinary Tract:  Adrenal glands are within normal limits. Tiny bilateral renal cysts.  No hydronephrosis. Stomach/Bowel: Stomach is within normal limits. Visualized bowel is unremarkable. Vascular/Lymphatic: No evidence of abdominal aortic aneurysm. Suspected lesion abuts the IVC. Small upper abdominal/retroperitoneal lymph nodes, including a 2.4 cm short axis left para-aortic node (series 3/image 28), poorly visualized. Other:  No abdominal ascites. Musculoskeletal: No focal osseous lesions. IMPRESSION: Limited evaluation due to motion degraded imaging. 1.9 x 2.6 cm hyperenhancing lesion arising along the posterior aspect of the pancreatic head, suspicious for pancreatic adenocarcinoma. Suspected involvement of the distal common duct,  with secondary intrahepatic/extrahepatic ductal dilatation. Lesion also abuts the IVC. Multifocal hepatic metastases, with index lesions as above. Small upper abdominal/retroperitoneal lymph  nodes, poorly evaluated. Electronically Signed   By: Julian Hy M.D.   On: 09/08/2018 15:41   US Abdomen Limited Ruq  Result Date: 09/03/2018 CLINICAL DATA:  Elevated liver function tests. EXAM: ULTRASOUND ABDOMEN LIMITED RIGHT UPPER QUADRANT COMPARISON:  CT abdomen and pelvis 07/31/2016. FINDINGS: Gallbladder: The gallbladder is completely filled with sludge and mildly distended. No wall thickening or pericholecystic fluid. Sonographer reports negative Murphy's sign. Common bile duct: Diameter: 12 mm. Liver: Three solid lesions with mildly decreased echogenicity are identified. The largest is in the right lobe measuring 2.9 x 3.3 x 3.4 cm. A second lesion in the right lobe measures 2.6 cm in diameter and a lesion in the left lobe measures 1.8 cm in diameter. There is intrahepatic biliary ductal dilatation. Portal vein is patent on color Doppler imaging with normal direction of blood flow towards the liver. IMPRESSION: 3 lesions in the liver are highly suspicious for carcinoma. Intra and extrahepatic biliary ductal dilatation. Cause for obstruction is not identified but constellation of findings raise the possibility of a pancreatic or ampullary mass. MRI/MRCP of the abdomen with and without contrast is recommended for further evaluation. Distended gallbladder is filled with sludge. Electronically Signed   By: Inge Rise M.D.   On: 09/03/2018 14:18   (Echo, Carotid, EGD, Colonoscopy, ERCP)    Subjective: Patient waiting for biopsy anxious to go home wanting to eat no new complaints  Discharge Exam: Vitals:   09/11/18 0855 09/11/18 0912  BP: (!) 146/86 (!) 146/77  Pulse: 69 69  Resp: 17 20  Temp:  98.1 F (36.7 C)  SpO2: (!) 89% 90%   Vitals:   09/11/18 0845 09/11/18 0850 09/11/18 0855 09/11/18  0912  BP: (!) 147/88 138/82 (!) 146/86 (!) 146/77  Pulse: 69 69 69 69  Resp: 20 18 17 20   Temp:    98.1 F (36.7 C)  TempSrc:    Oral  SpO2: 92% 92% (!) 89% 90%  Weight:      Height:        General: Pt is alert, awake, not in acute distress Cardiovascular: RRR, S1/S2 +, no rubs, no gallops Respiratory: CTA bilaterally, no wheezing, no rhonchi Abdominal: Soft, NT, ND, bowel sounds + Extremities: no edema, no cyanosis    The results of significant diagnostics from this hospitalization (including imaging, microbiology, ancillary and laboratory) are listed below for reference.     Microbiology: No results found for this or any previous visit (from the past 240 hour(s)).   Labs: BNP (last 3 results) No results for input(s): BNP in the last 8760 hours. Basic Metabolic Panel: Recent Labs  Lab 09/08/18 1230 09/09/18 0548  NA 134* 136  K 4.3 4.0  CL 98 106  CO2 21* 23  GLUCOSE 90 96  BUN 10 8  CREATININE 0.62 0.55*  CALCIUM 9.1 8.2*   Liver Function Tests: Recent Labs  Lab 09/08/18 1230 09/09/18 0548 09/10/18 0759  AST 229* 186* 144*  ALT 516* 412* 384*  ALKPHOS 300* 269* 304*  BILITOT 18.3* 16.5* 8.9*  PROT 6.4* 5.5* 6.6  ALBUMIN 3.1* 2.6* 3.1*   Recent Labs  Lab 09/08/18 1230  LIPASE 28   Recent Labs  Lab 09/08/18 1230  AMMONIA 32   CBC: Recent Labs  Lab 09/08/18 1230 09/09/18 0548  WBC 10.6* 9.7  NEUTROABS 8.4*  --   HGB 18.7* 17.2*  HCT 54.6* 49.8  MCV 92.7 91.9  PLT 235 221   Cardiac Enzymes: No results for input(s): CKTOTAL,  CKMB, CKMBINDEX, TROPONINI in the last 168 hours. BNP: Invalid input(s): POCBNP CBG: No results for input(s): GLUCAP in the last 168 hours. D-Dimer No results for input(s): DDIMER in the last 72 hours. Hgb A1c No results for input(s): HGBA1C in the last 72 hours. Lipid Profile No results for input(s): CHOL, HDL, LDLCALC, TRIG, CHOLHDL, LDLDIRECT in the last 72 hours. Thyroid function studies No results for  input(s): TSH, T4TOTAL, T3FREE, THYROIDAB in the last 72 hours.  Invalid input(s): FREET3 Anemia work up No results for input(s): VITAMINB12, FOLATE, FERRITIN, TIBC, IRON, RETICCTPCT in the last 72 hours. Urinalysis No results found for: COLORURINE, APPEARANCEUR, LABSPEC, [place], GLUCOSEU, HGBUR, BILIRUBINUR, KETONESUR, PROTEINUR, UROBILINOGEN, NITRITE, LEUKOCYTESUR Sepsis Labs Invalid input(s): PROCALCITONIN,  WBC,  LACTICIDVEN Microbiology No results found for this or any previous visit (from the past 240 hour(s)).   Time coordinating discharge: Over 30 minutes  SIGNED:   Georgette Shell, MD  [facility] 09/11/2018, 11:46 AM Pager   If 7PM-7AM, please contact night-coverage [email or web address] Password [email or web address]

## 2018-09-11 NOTE — Progress Notes (Signed)
Subjective: Patient was seen and examined at bedside. He had ultrasound-guided liver biopsy performed today morning. He complains of mild abdominal discomfort and is requesting for his diet to be advanced.  Objective: Vital signs in last 24 hours: Temp:  [97.6 F (36.4 C)-98.4 F (36.9 C)] 98.1 F (36.7 C) (03/19 0912) Pulse Rate:  [67-71] 69 (03/19 0912) Resp:  [17-20] 20 (03/19 0912) BP: (138-156)/(73-88) 146/77 (03/19 0912) SpO2:  [87 %-96 %] 90 % (03/19 0912) Weight:  [78.9 kg] 78.9 kg (03/18 2111) Weight change: 0 kg Last BM Date: 09/09/18  PE: Improvement in icterus, mild pallor GENERAL: Not in distress ABDOMEN:soft,not distended, non tender EXTREMITIES:no deformity  Lab Results: Results for orders placed or performed during the hospital encounter of 09/08/18 (from the past 48 hour(s))  Hepatic function panel     Status: Abnormal   Collection Time: 09/10/18  7:59 AM  Result Value Ref Range   Total Protein 6.6 6.5 - 8.1 g/dL   Albumin 3.1 (L) 3.5 - 5.0 g/dL   AST 144 (H) 15 - 41 U/L   ALT 384 (H) 0 - 44 U/L   Alkaline Phosphatase 304 (H) 38 - 126 U/L   Total Bilirubin 8.9 (H) 0.3 - 1.2 mg/dL   Bilirubin, Direct 4.7 (H) 0.0 - 0.2 mg/dL   Indirect Bilirubin 4.2 (H) 0.3 - 0.9 mg/dL    Comment: Performed at [facility], [address] [place], [place] [address]    Studies/Results: Dg Abd 1 View  Result Date: 09/10/2018 CLINICAL DATA:  Pt having distention and constipation Biliary stent placed yesterday EXAM: ABDOMEN - 1 VIEW COMPARISON:  None. FINDINGS: Stable metallic endoscopic CBD stent. Multiple gas filled nondilated loops of small bowel. Enteric contrast material and moderate fecal material in the proximal colon, which is decompressed distally. Bilateral iliofemoral arterial calcifications. Regional bones unremarkable. IMPRESSION: Nonobstructive bowel gas pattern. Electronically Signed   By: Lucrezia Europe M.D.   On: 09/10/2018 09:17   Dg Ercp Biliary &  Pancreatic Ducts  Result Date: 09/09/2018 CLINICAL DATA:  Pancreatic mass, biliary obstruction and liver lesions. EXAM: ERCP TECHNIQUE: Multiple spot images obtained with the fluoroscopic device and submitted for interpretation post-procedure. COMPARISON:  MRI of the abdomen on 09/08/2018 FINDINGS: Images obtained with a C-arm during the endoscopic procedure demonstrate cannulation of the common bile duct with contrast injection demonstrating high-grade stricture and obstruction of the distal common bile duct. There is diffuse dilatation of intrahepatic bile ducts and the proximal CBD. A self expanding metallic biliary stent was placed spanning across the level of obstruction. IMPRESSION: High-grade stricture and obstruction of the distal common bile duct treated with placement of a self expanding metallic biliary stent. These images were submitted for radiologic interpretation only. Please see the procedural report for the amount of contrast and the fluoroscopy time utilized. Electronically Signed   By: Aletta Edouard M.D.   On: 09/09/2018 14:33    Medications: I have reviewed the patient's current medications.  Assessment: Pancreatic mass with multiple liver mass suspicious for metastatic pancreatic cancer.  Evaded CA-19-9 ERCP with metal stent placement, cytology did not show malignant cells. Liver biopsy performed today  Plan: Okay to be discharged from GI standpoint. Recommend oncology evaluation and follow-up as an outpatient. Please recall if needed   Ronnette Juniper 09/11/2018, 11:28 AM   Pager [phone] If no answer or after 5 PM call [phone]

## 2018-09-12 DIAGNOSIS — K8689 Other specified diseases of pancreas: Secondary | ICD-10-CM

## 2018-09-12 NOTE — Progress Notes (Signed)
Called patient to introduce myself and role of navigator and to advise of new patient appointment on 09/15/18 @ 1:45 PM. Patient aware to arrive 30 minutes prior to his appointment. Provided my direct number for questions or concerns.

## 2018-09-12 NOTE — Anesthesia Postprocedure Evaluation (Signed)
Anesthesia Post Note  Patient: Ricky Conway  Procedure(s) Performed: ENDOSCOPIC RETROGRADE CHOLANGIOPANCREATOGRAPHY (ERCP) (N/A ) SPHINCTEROTOMY REMOVAL OF STONES BILIARY STENT PLACEMENT BILIARY BRUSHING     Patient location during evaluation: Endoscopy Anesthesia Type: General Level of consciousness: awake and alert Pain management: pain level controlled Vital Signs Assessment: post-procedure vital signs reviewed and stable Respiratory status: spontaneous breathing, nonlabored ventilation, respiratory function stable and patient connected to nasal cannula oxygen Cardiovascular status: blood pressure returned to baseline and stable Postop Assessment: no apparent nausea or vomiting Anesthetic complications: no    Last Vitals:  Vitals:   09/11/18 0855 09/11/18 0912  BP: (!) 146/86 (!) 146/77  Pulse: 69 69  Resp: 17 20  Temp:  36.7 C  SpO2: (!) 89% 90%    Last Pain:  Vitals:   09/11/18 0941  TempSrc:   PainSc: 0-No pain                 Hanin Decook

## 2018-09-15 ENCOUNTER — Telehealth: Payer: Self-pay

## 2018-09-15 ENCOUNTER — Encounter: Payer: Self-pay | Admitting: Nurse Practitioner

## 2018-09-15 ENCOUNTER — Inpatient Hospital Stay: Payer: 59 | Attending: Nurse Practitioner | Admitting: Nurse Practitioner

## 2018-09-15 ENCOUNTER — Other Ambulatory Visit: Payer: Self-pay

## 2018-09-15 ENCOUNTER — Telehealth: Payer: Self-pay | Admitting: Nurse Practitioner

## 2018-09-15 VITALS — BP 124/76 | HR 79 | Temp 99.0°F | Resp 18 | Ht 70.0 in | Wt 167.2 lb

## 2018-09-15 DIAGNOSIS — I10 Essential (primary) hypertension: Secondary | ICD-10-CM

## 2018-09-15 DIAGNOSIS — C787 Secondary malignant neoplasm of liver and intrahepatic bile duct: Secondary | ICD-10-CM

## 2018-09-15 DIAGNOSIS — C7889 Secondary malignant neoplasm of other digestive organs: Secondary | ICD-10-CM | POA: Diagnosis not present

## 2018-09-15 DIAGNOSIS — C801 Malignant (primary) neoplasm, unspecified: Secondary | ICD-10-CM | POA: Diagnosis not present

## 2018-09-15 DIAGNOSIS — J449 Chronic obstructive pulmonary disease, unspecified: Secondary | ICD-10-CM

## 2018-09-15 DIAGNOSIS — K831 Obstruction of bile duct: Secondary | ICD-10-CM

## 2018-09-15 DIAGNOSIS — K8689 Other specified diseases of pancreas: Secondary | ICD-10-CM

## 2018-09-15 DIAGNOSIS — F17210 Nicotine dependence, cigarettes, uncomplicated: Secondary | ICD-10-CM

## 2018-09-15 DIAGNOSIS — Z7289 Other problems related to lifestyle: Secondary | ICD-10-CM

## 2018-09-15 NOTE — Progress Notes (Addendum)
New Hematology/Oncology Consult   Requesting MD: Dr. Mayra Neer  [phone]      Reason for Consult: Pancreas mass, liver lesions  HPI: Ricky Conway is a 73 year old man who reports being found to have abnormal liver enzymes at the time of his routine physical exam earlier this month.  He underwent an abdominal ultrasound on 09/03/2018 due to elevated LFTs.  He was noted to have 3 liver lesions as well as intra-and extrahepatic biliary ductal dilatation.  He presented to the emergency department 09/08/2018 with painless jaundice.  Initial bilirubin returned at 18.3.  He underwent MRI abdomen 09/08/2018 with findings of a 1.9 x 2.6 cm hypoenhancing lesion arising exophytically along the posterior aspect of the pancreatic head suspicious for pancreatic adenocarcinoma.  There was suspected involvement of the distal common duct with secondary intrahepatic/extrahepatic ductal dilatation.  Lesion noted to abut the IVC.  Multiple liver metastases noted, at least 6 in number.  Small upper abdominal/retroperitoneal lymph nodes noted.  He underwent ERCP 09/09/2018.  The upper third of the main bile duct was markedly dilated secondary to a stricture in mid and distal common bile duct.  A metallic biliary stent was placed.  Brushings in the mid and lower third of the main bile duct were obtained with no malignant cells identified.  The bilirubin was improved at 4.7 on 09/10/2018.  He underwent biopsy of a liver lesion on 09/11/2018.     Past Medical History:  Diagnosis Date  . 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 COPD (chronic obstructive pulmonary disease) ([place])   . Hyperlipidemia   . Hypertension   :   Past Surgical History:  Procedure Laterality Date  . BILIARY BRUSHING  09/09/2018   Procedure: BILIARY BRUSHING;  Surgeon: Ronnette Juniper, MD;  Location: [facility] ENDOSCOPY;  Service: Gastroenterology;;  . BILIARY STENT PLACEMENT  09/09/2018   Procedure: BILIARY STENT PLACEMENT;  Surgeon: Ronnette Juniper,  MD;  Location: [place];  Service: Gastroenterology;;  . ERCP N/A 09/09/2018   Procedure: ENDOSCOPIC RETROGRADE CHOLANGIOPANCREATOGRAPHY (ERCP);  Surgeon: Ronnette Juniper, MD;  Location: [place];  Service: Gastroenterology;  Laterality: N/A;  . FOOT SURGERY    . HERNIA REPAIR     WITH MESH  . REMOVAL OF STONES  09/09/2018   Procedure: REMOVAL OF STONES;  Surgeon: Ronnette Juniper, MD;  Location: [place];  Service: Gastroenterology;;  . Joan Mayans  09/09/2018   Procedure: Joan Mayans;  Surgeon: Ronnette Juniper, MD;  Location: [facility] ENDOSCOPY;  Service: Gastroenterology;;  :   Current Outpatient Medications:  .  amLODipine (NORVASC) 10 MG tablet, Take 10 mg by mouth daily., Disp: , Rfl:  .  furosemide (LASIX) 20 MG tablet, Take 20 mg by mouth daily., Disp: , Rfl:  .  lisinopril (PRINIVIL,ZESTRIL) 40 MG tablet, Take 40 mg by mouth daily., Disp: , Rfl:  .  simvastatin (ZOCOR) 40 MG tablet, Take 40 mg by mouth daily at 6 PM., Disp: , Rfl: :  :  No Known Allergies:  FH: He thinks a sister may have had cancer.  Otherwise no family history of malignancy.  SOCIAL HISTORY: He lives in [place].  He is married.  He has 5 children.  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  Tobacco use reported at 1 pack/day for 60 years.  Up until about a week ago he would drink 4 to 8 ounces of wine per day.  Review of Systems: No fevers or sweats.  Appetite has been diminished.  He has lost some weight. He  is not sleeping well.  He has had intermittent abdominal pain for the past week.  He has stable dyspnea on exertion.  No cough.  He had constipation a few weeks ago.  This has resolved.  He is now having soft bowel movements several times a day.  Stool is not watery.  Urine color varies.   Physical Exam:  Blood pressure 124/76, pulse 79, temperature 99 F (37.2 C), temperature source Oral, resp. rate 18, height 5\' 10"  (1.778 m), weight 167 lb 3.2 oz (75.8 kg), SpO2 93 %.  Lungs:  Lungs clear bilaterally. Cardiac: Regular rate and rhythm. Abdomen: Fullness right abdomen with associated tenderness.  Vascular: No leg edema. Neurologic: Alert and oriented.  Follows commands. Skin: Does not appear significantly jaundice.  LABS:  09/08/2018 CEA 4350, CA-19-9 118  RADIOLOGY:  Dg Chest 2 View  Result Date: 09/08/2018 CLINICAL DATA:  Jaundice with biliary obstruction. Symptoms for 1 months. EXAM: CHEST - 2 VIEW COMPARISON:  None. FINDINGS: Cardiomegaly. Thoracic atherosclerosis. Hyperinflation with increased markings suggesting COPD. No consolidation or edema. No effusion or pneumothorax. Bones unremarkable. IMPRESSION: No active cardiopulmonary disease. Electronically Signed   By: Staci Righter M.D.   On: 09/08/2018 15:00   Dg Abd 1 View  Result Date: 09/10/2018 CLINICAL DATA:  Pt having distention and constipation Biliary stent placed yesterday EXAM: ABDOMEN - 1 VIEW COMPARISON:  None. FINDINGS: Stable metallic endoscopic CBD stent. Multiple gas filled nondilated loops of small bowel. Enteric contrast material and moderate fecal material in the proximal colon, which is decompressed distally. Bilateral iliofemoral arterial calcifications. Regional bones unremarkable. IMPRESSION: Nonobstructive bowel gas pattern. Electronically Signed   By: Lucrezia Europe M.D.   On: 09/10/2018 09:17   US Biopsy (liver)  Result Date: 09/11/2018 CLINICAL DATA:  Pancreatic mass. Multiple liver lesions. Biopsy requested EXAM: ULTRASOUND-GUIDED CORE LIVER BIOPSY TECHNIQUE: An ultrasound guided liver biopsy was thoroughly discussed with the patient and questions were answered. The benefits, risks, alternatives, and complications were also discussed. The patient understands and wishes to proceed with the procedure. A verbal as well as written consent was obtained. Survey ultrasound of the liver was performed. A representative lesion in the anterior right lobe was localized and appropriate skin entry site  determined and marked. Skin site was prepped with Betadine, and draped in usual sterile fashion, and infiltrated locally with 1% lidocaine. Intravenous Fentanyl 56mcg and Versed 1mg  were administered as conscious sedation during continuous monitoring of the patient's level of consciousness and physiological / cardiorespiratory status by the radiology RN, with a total moderate sedation time of 8 minutes. A 17 gauge trocar needle was advanced under ultrasound guidance into the liver to the margin of the lesion. 3 coaxial 18gauge core samples were then obtained through the guide needle. The guide needle was removed. Post procedure scans demonstrate no apparent complication. COMPLICATIONS: COMPLICATIONS None immediate FINDINGS: Hypoechoic liver lesions were localized. Representative core biopsy samples obtained as above. IMPRESSION: 1. Technically successful ultrasound guided core liver lesion biopsy. Electronically Signed   By: Lucrezia Europe M.D.   On: 09/11/2018 13:35   Dg Ercp Biliary & Pancreatic Ducts  Result Date: 09/09/2018 CLINICAL DATA:  Pancreatic mass, biliary obstruction and liver lesions. EXAM: ERCP TECHNIQUE: Multiple spot images obtained with the fluoroscopic device and submitted for interpretation post-procedure. COMPARISON:  MRI of the abdomen on 09/08/2018 FINDINGS: Images obtained with a C-arm during the endoscopic procedure demonstrate cannulation of the common bile duct with contrast injection demonstrating high-grade stricture and obstruction of the distal  common bile duct. There is diffuse dilatation of intrahepatic bile ducts and the proximal CBD. A self expanding metallic biliary stent was placed spanning across the level of obstruction. IMPRESSION: High-grade stricture and obstruction of the distal common bile duct treated with placement of a self expanding metallic biliary stent. These images were submitted for radiologic interpretation only. Please see the procedural report for the amount of  contrast and the fluoroscopy time utilized. Electronically Signed   By: Aletta Edouard M.D.   On: 09/09/2018 14:33   Mr Abdomen Mrcp Moise Boring Contast  Result Date: 09/08/2018 CLINICAL DATA:  Abnormal LFTs, liver lesions ultrasound EXAM: MRI ABDOMEN WITHOUT AND WITH CONTRAST (INCLUDING MRCP) TECHNIQUE: Multiplanar multisequence MR imaging of the abdomen was performed both before and after the administration of intravenous contrast. Heavily T2-weighted images of the biliary and pancreatic ducts were obtained, and three-dimensional MRCP images were rendered by post processing. CONTRAST:  8 mL Gadovist IV COMPARISON:  Right upper quadrant ultrasound dated 09/03/2018. CT abdomen/pelvis dated 07/31/2016. FINDINGS: Motion degraded images, particularly dynamic postcontrast imaging, which is severely degraded. Lower chest: Minimal dependent atelectasis lung bases. Hepatobiliary: Multifocal hepatic lesions/metastases, at least 6 in number, including: --2.7 cm lesion in segment 2 (series 5/image 14) --2.7 cm lesion in segment 7 (series 5/image 14) --Two lesions in segment 4A measuring 2.8 and 3.5 cm (series 5/image 15) --2.5 cm lesion in segment 6 (series 5/image 35) Distended gallbladder with layering tiny gallstones and gallbladder sludge (series 5/image 36). Intrahepatic and extrahepatic ductal dilatation. Central common duct measures 18 mm. Abrupt narrowing at the level of the mid/distal common duct (series 4/image 31), suggesting extrinsic compression/tumor involvement. Pancreas: 1.9 x 2.6 cm hypoenhancing lesion arising exophytically along the posterior aspect of the pancreatic head (series 1301/image 42), suspicious for pancreatic adenocarcinoma. No pancreatic atrophy or ductal dilatation. Spleen:  Within normal limits. Adrenals/Urinary Tract:  Adrenal glands are within normal limits. Tiny bilateral renal cysts.  No hydronephrosis. Stomach/Bowel: Stomach is within normal limits. Visualized bowel is unremarkable.  Vascular/Lymphatic: No evidence of abdominal aortic aneurysm. Suspected lesion abuts the IVC. Small upper abdominal/retroperitoneal lymph nodes, including a 2.4 cm short axis left para-aortic node (series 3/image 28), poorly visualized. Other:  No abdominal ascites. Musculoskeletal: No focal osseous lesions. IMPRESSION: Limited evaluation due to motion degraded imaging. 1.9 x 2.6 cm hyperenhancing lesion arising along the posterior aspect of the pancreatic head, suspicious for pancreatic adenocarcinoma. Suspected involvement of the distal common duct, with secondary intrahepatic/extrahepatic ductal dilatation. Lesion also abuts the IVC. Multifocal hepatic metastases, with index lesions as above. Small upper abdominal/retroperitoneal lymph nodes, poorly evaluated. Electronically Signed   By: Julian Hy M.D.   On: 09/08/2018 15:41   US Abdomen Limited Ruq  Result Date: 09/03/2018 CLINICAL DATA:  Elevated liver function tests. EXAM: ULTRASOUND ABDOMEN LIMITED RIGHT UPPER QUADRANT COMPARISON:  CT abdomen and pelvis 07/31/2016. FINDINGS: Gallbladder: The gallbladder is completely filled with sludge and mildly distended. No wall thickening or pericholecystic fluid. Sonographer reports negative Murphy's sign. Common bile duct: Diameter: 12 mm. Liver: Three solid lesions with mildly decreased echogenicity are identified. The largest is in the right lobe measuring 2.9 x 3.3 x 3.4 cm. A second lesion in the right lobe measures 2.6 cm in diameter and a lesion in the left lobe measures 1.8 cm in diameter. There is intrahepatic biliary ductal dilatation. Portal vein is patent on color Doppler imaging with normal direction of blood flow towards the liver. IMPRESSION: 3 lesions in the liver are highly suspicious for  carcinoma. Intra and extrahepatic biliary ductal dilatation. Cause for obstruction is not identified but constellation of findings raise the possibility of a pancreatic or ampullary mass. MRI/MRCP of the  abdomen with and without contrast is recommended for further evaluation. Distended gallbladder is filled with sludge. Electronically Signed   By: Inge Rise M.D.   On: 09/03/2018 14:18    Assessment and Plan:   1. Pancreas mass, liver lesions  Abdominal ultrasound 09/03/2018-3 liver lesions.  Intra-and extrahepatic biliary ductal dilatation.  Presentation to the emergency department 09/08/2018 with painless jaundice.  Initial bilirubin returned at 18.3.    MRI abdomen 09/08/2018-1.9 x 2.6 cm hypoenhancing lesion arising exophytically along the posterior aspect of the pancreatic head suspicious for pancreatic adenocarcinoma.  There was suspected involvement of the distal common duct with secondary intrahepatic/extrahepatic ductal dilatation.  Lesion noted to abut the IVC.  Multiple liver metastases noted.  Small upper abdominal/retroperitoneal lymph nodes noted.    ERCP 09/09/2018-high-grade stricture and obstruction of the distal common bile duct.  A metallic biliary stent was placed.  Brushings in the mid and lower third of the main bile duct were obtained with no malignant cells identified.    Bilirubin improved at 4.7 on 09/10/2018.    Biopsy of a liver lesion on 09/11/2018. 2. Biliary obstruction status post stent placement 3. COPD 4. Hypertension  Ricky Conway is a 72 year old man who recently presented with obstructive jaundice.  He was found to have a pancreas mass and multiple liver lesions.  He underwent placement of a biliary stent.  Biopsy of a liver lesion was done on 09/11/2018.  Dr. Benay Spice has spoken with the pathologist with a preliminary report of adenocarcinoma with special stains suggestive of a possible lung primary.  We will await the final report.  We are referring him for a staging chest CT.  His case will be presented at the GI tumor conference.  He will return for a follow-up visit on 09/24/2018.  Patient seen with Dr. Benay Spice.  45 minutes were spent face-to-face at  today's visit with the majority of that time involved in counseling/coordination of care.    Ned Card, NP 09/15/2018, 1:48 PM   This was a shared visit with Ned Card.  Ricky Conway presented with obstructive jaundice.  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  I reviewed the abdomen MRI images.  The clinical presentation is consistent with metastatic pancreas cancer, but the immunohistochemical stains on the liver biopsy are more consistent with lung cancer.  He is a smoker.  We discussed the differential diagnosis with Ricky Conway.  We specifically discussed non-small cell lung cancer and pancreas cancer.  He will be referred for a staging chest CT.  We will submit the liver biopsy pathology for Foundation 1 testing.  He understands in either case treatment will consist of systemic therapy.  No therapy will be curative.  Ricky Conway will return for an office visit and further discussion next week.  Julieanne Manson, MD

## 2018-09-15 NOTE — Telephone Encounter (Signed)
Called Mr. and Mrs. Popiel to advise of visitor restrictions. Patient has initial consult with Lattie Haw Thomas/Dr. Benay Spice.  They were both understanding. I provided my contact information and encouraged them to call me at any time with questions or concerns. I will f/u with them after their initial med/onc appointment.

## 2018-09-15 NOTE — Telephone Encounter (Signed)
Gave avs and calendar ° °

## 2018-09-22 ENCOUNTER — Ambulatory Visit
Admission: RE | Admit: 2018-09-22 | Discharge: 2018-09-22 | Disposition: A | Discharge status: Home / Self Care | Payer: 59 | Source: Ambulatory Visit | Attending: Nurse Practitioner | Admitting: Nurse Practitioner

## 2018-09-22 ENCOUNTER — Other Ambulatory Visit: Payer: Self-pay

## 2018-09-22 DIAGNOSIS — K8689 Other specified diseases of pancreas: Secondary | ICD-10-CM

## 2018-09-22 MED ORDER — IOPAMIDOL (ISOVUE-300) INJECTION 61%
75.0000 mL | Freq: Once | INTRAVENOUS | Status: AC | PRN
  Administered 2018-09-22: 75 mL via INTRAVENOUS

## 2018-09-24 ENCOUNTER — Inpatient Hospital Stay: Payer: 59 | Attending: Oncology | Admitting: Oncology

## 2018-09-24 ENCOUNTER — Other Ambulatory Visit: Payer: Self-pay

## 2018-09-24 VITALS — BP 122/64 | HR 78 | Temp 98.5°F | Resp 18 | Ht 70.0 in | Wt 161.5 lb

## 2018-09-24 DIAGNOSIS — C3490 Malignant neoplasm of unspecified part of unspecified bronchus or lung: Secondary | ICD-10-CM | POA: Diagnosis not present

## 2018-09-24 DIAGNOSIS — Z5111 Encounter for antineoplastic chemotherapy: Secondary | ICD-10-CM | POA: Diagnosis present

## 2018-09-24 DIAGNOSIS — Z5112 Encounter for antineoplastic immunotherapy: Secondary | ICD-10-CM | POA: Insufficient documentation

## 2018-09-24 DIAGNOSIS — C787 Secondary malignant neoplasm of liver and intrahepatic bile duct: Secondary | ICD-10-CM | POA: Diagnosis present

## 2018-09-24 DIAGNOSIS — J449 Chronic obstructive pulmonary disease, unspecified: Secondary | ICD-10-CM | POA: Insufficient documentation

## 2018-09-24 DIAGNOSIS — R911 Solitary pulmonary nodule: Secondary | ICD-10-CM | POA: Diagnosis not present

## 2018-09-24 DIAGNOSIS — E538 Deficiency of other specified B group vitamins: Secondary | ICD-10-CM | POA: Diagnosis not present

## 2018-09-24 DIAGNOSIS — K8689 Other specified diseases of pancreas: Secondary | ICD-10-CM

## 2018-09-24 DIAGNOSIS — D378 Neoplasm of uncertain behavior of other specified digestive organs: Secondary | ICD-10-CM | POA: Diagnosis not present

## 2018-09-24 DIAGNOSIS — I10 Essential (primary) hypertension: Secondary | ICD-10-CM

## 2018-09-24 DIAGNOSIS — C801 Malignant (primary) neoplasm, unspecified: Secondary | ICD-10-CM | POA: Diagnosis not present

## 2018-09-24 DIAGNOSIS — R1900 Intra-abdominal and pelvic swelling, mass and lump, unspecified site: Secondary | ICD-10-CM | POA: Diagnosis not present

## 2018-09-24 NOTE — Progress Notes (Signed)
[facility] OFFICE PROGRESS NOTE   Diagnosis: Metastatic adenocarcinoma  INTERVAL HISTORY:   Mr. Meisenheimer returns as scheduled.  He feels well.  No complaint.  He reports a good appetite.  Objective:  Vital signs in last 24 hours:  Blood pressure 122/64, pulse 78, temperature 98.5 F (36.9 C), temperature source Oral, resp. rate 18, height 5\' 10"  (1.778 m), weight 161 lb 8 oz (73.3 kg), SpO2 (!) 89 %.    Physical examination: Not performed today  Lab Results:  Lab Results  Component Value Date   WBC 9.7 09/09/2018   HGB 17.2 (H) 09/09/2018   HCT 49.8 09/09/2018   MCV 91.9 09/09/2018   PLT 221 09/09/2018   NEUTROABS 8.4 (H) 09/08/2018    CMP  Lab Results  Component Value Date   NA 136 09/09/2018   K 4.0 09/09/2018   CL 106 09/09/2018   CO2 23 09/09/2018   GLUCOSE 96 09/09/2018   BUN 8 09/09/2018   CREATININE 0.55 (L) 09/09/2018   CALCIUM 8.2 (L) 09/09/2018   PROT 6.6 09/10/2018   ALBUMIN 3.1 (L) 09/10/2018   AST 144 (H) 09/10/2018   ALT 384 (H) 09/10/2018   ALKPHOS 304 (H) 09/10/2018   BILITOT 8.9 (H) 09/10/2018   GFRNONAA >60 09/09/2018   GFRAA >60 09/09/2018    Lab Results  Component Value Date   CEA1 4,350.0 (H) 09/08/2018     Imaging:  Ct Chest W Contrast  Result Date: 09/22/2018 CLINICAL DATA:  Liver metastasis.  Potential long primary EXAM: CT CHEST WITH CONTRAST TECHNIQUE: Multidetector CT imaging of the chest was performed during intravenous contrast administration. CONTRAST:  69mL ISOVUE-300 IOPAMIDOL (ISOVUE-300) INJECTION 61% COMPARISON:  None. FINDINGS: Cardiovascular: Coronary artery calcification and aortic atherosclerotic calcification. Mediastinum/Nodes: No axillary or supraclavicular adenopathy. Elongated lymph node along the esophagus measures only 10 mm short axis but extends 3 to 4 cm in length (image 69/2) RIGHT hilar lymph node measures 14 mm (image 90/2). RIGHT paratracheal lymph node measures 11 mm (image 76/2)  Lungs/Pleura: Extensive centrilobular emphysema in the upper lobes. Ill-defined branching nodularity in the RIGHT lower lobe measures 14 mm (image 85/2). No additional suspicious nodularity. Upper Abdomen: Multiple hepatic metastasis again noted. Retrocrural lymph nodes measuring up to 14 mm (image 130/2. Musculoskeletal: several mildly displaced LEFT posterior rib fractures have a traumatic pattern IMPRESSION: 1. RIGHT hilar lymphadenopathy, RIGHT paratracheal adenopathy, paraesophageal adenopathy and retrocrural adenopathy consistent metastatic disease. 2. Ill-defined RIGHT lower lobe branching nodularity is nonspecific but could represent an atypical primary lung carcinoma. 3. Centrilobular emphysema. 4. Hepatic metastasis. Electronically Signed   By: Suzy Bouchard M.D.   On: 09/22/2018 09:44    Medications: I have reviewed the patient's current medications.   Assessment/Plan:  1. Pancreas mass, liver lesions  Abdominal ultrasound 09/03/2018-3 liver lesions.  Intra-and extrahepatic biliary ductal dilatation.  Presentation to the emergency department 09/08/2018 with painless jaundice.  Initial bilirubin returned at 18.3.    MRI abdomen 09/08/2018-1.9 x 2.6 cm hypoenhancing lesion arising exophytically along the posterior aspect of the pancreatic head suspicious for pancreatic adenocarcinoma.  There was suspected involvement of the distal common duct with secondary intrahepatic/extrahepatic ductal dilatation.  Lesion noted to abut the IVC.  Multiple liver metastases noted.  Small upper abdominal/retroperitoneal lymph nodes noted.    ERCP 09/09/2018-high-grade stricture and obstruction of the distal common bile duct.  A metallic biliary stent was placed.  Brushings in the mid and lower third of the main bile duct were obtained with no  malignant cells identified.    Bilirubin improved at 4.7 on 09/10/2018.    Biopsy of a liver lesion on 09/11/2018-metastatic carcinoma, cytokeratin 7 and TTF-1  positive, negative for cytokeratin 20, CDX 2, and PSA.  CT chest 09/22/2018- right hilar/mediastinal/retrocrural lymphadenopathy, ill-defined right lower lobe nodularity, emphysema 2. Biliary obstruction status post stent placement 3. COPD 4. Hypertension  Disposition: Mr. Tiu appears unchanged.  The chest CT reveals lymphadenopathy, but no apparent lung primary.  There is nodularity in the right lower lobe that may be inflammatory, a metastasis, or a primary lung tumor.  I reviewed the CT images.  I presented his case at the GI tumor conference earlier today.  The immunohistochemical profile on the liver biopsy is most consistent with lung cancer.  It is unclear whether the mass obstructing the bile duct is in the pancreas.  The mass could represent a peripancreatic lymph node.  The differential diagnosis includes metastatic pancreas cancer and metastatic non-small cell lung cancer.  I discussed the differential diagnosis with Mr. Munyan.  The recommendation from the GI tumor conference is to proceed with a EUS in an attempt to better define the "pancreas "mass and obtain another biopsy.  I will consult Dr. Paulita Fujita.  Mr. Homan will return for an office visit on 10/03/2018.  I discussed the situation with his wife by telephone.  Mr. Leonhardt will contact us for new symptoms.  [name], MD  09/24/2018  11:59 AM

## 2018-09-25 ENCOUNTER — Telehealth: Payer: Self-pay | Admitting: Oncology

## 2018-09-25 NOTE — Telephone Encounter (Signed)
Scheduled appt per 4/1 los. Spoke with patient re appts.

## 2018-09-26 ENCOUNTER — Telehealth: Payer: Self-pay | Admitting: *Deleted

## 2018-09-26 NOTE — Telephone Encounter (Signed)
Informed patient that Dr. Paulita Fujita is out of the office for 3 weeks. Dr. Benay Spice has arranged for [facility] to see him.They will be calling soon. He requests the call his work/cell # since home # is not in service. Dr. Benay Spice will let Dr. Ardis Hughs know how to reach him

## 2018-10-01 ENCOUNTER — Telehealth: Payer: Self-pay | Admitting: Oncology

## 2018-10-01 NOTE — Telephone Encounter (Signed)
Faxed medical records to [facility]. Release [place]#[phone]

## 2018-10-03 ENCOUNTER — Other Ambulatory Visit: Payer: Self-pay

## 2018-10-03 ENCOUNTER — Encounter: Payer: Self-pay | Admitting: Nurse Practitioner

## 2018-10-03 ENCOUNTER — Telehealth: Payer: Self-pay | Admitting: Nurse Practitioner

## 2018-10-03 ENCOUNTER — Inpatient Hospital Stay (HOSPITAL_BASED_OUTPATIENT_CLINIC_OR_DEPARTMENT_OTHER): Payer: 59 | Admitting: Nurse Practitioner

## 2018-10-03 ENCOUNTER — Encounter: Payer: Self-pay | Admitting: *Deleted

## 2018-10-03 VITALS — BP 106/58 | HR 87 | Temp 98.7°F | Resp 18 | Ht 70.0 in | Wt 163.4 lb

## 2018-10-03 DIAGNOSIS — K8689 Other specified diseases of pancreas: Secondary | ICD-10-CM

## 2018-10-03 DIAGNOSIS — C3490 Malignant neoplasm of unspecified part of unspecified bronchus or lung: Secondary | ICD-10-CM

## 2018-10-03 DIAGNOSIS — J449 Chronic obstructive pulmonary disease, unspecified: Secondary | ICD-10-CM | POA: Diagnosis not present

## 2018-10-03 DIAGNOSIS — C787 Secondary malignant neoplasm of liver and intrahepatic bile duct: Secondary | ICD-10-CM | POA: Diagnosis not present

## 2018-10-03 DIAGNOSIS — C801 Malignant (primary) neoplasm, unspecified: Secondary | ICD-10-CM

## 2018-10-03 DIAGNOSIS — Z5112 Encounter for antineoplastic immunotherapy: Secondary | ICD-10-CM | POA: Diagnosis not present

## 2018-10-03 DIAGNOSIS — R1900 Intra-abdominal and pelvic swelling, mass and lump, unspecified site: Secondary | ICD-10-CM | POA: Diagnosis not present

## 2018-10-03 DIAGNOSIS — Z7189 Other specified counseling: Secondary | ICD-10-CM | POA: Insufficient documentation

## 2018-10-03 DIAGNOSIS — I10 Essential (primary) hypertension: Secondary | ICD-10-CM

## 2018-10-03 MED ORDER — FOLIC ACID 1 MG PO TABS: 1.0000 mg | ORAL_TABLET | Freq: Every day | ORAL | 4 refills | Status: DC

## 2018-10-03 MED ORDER — CYANOCOBALAMIN 1000 MCG/ML IJ SOLN
1000.0000 ug | Freq: Once | INTRAMUSCULAR | Status: AC
  Administered 2018-10-03: 1000 ug via SUBCUTANEOUS

## 2018-10-03 MED ORDER — CYANOCOBALAMIN 1000 MCG/ML IJ SOLN
INTRAMUSCULAR | Status: AC
  Filled 2018-10-03: qty 1

## 2018-10-03 MED ORDER — PROCHLORPERAZINE MALEATE 5 MG PO TABS: 5.0000 mg | ORAL_TABLET | Freq: Four times a day (QID) | ORAL | 3 refills | Status: DC | PRN

## 2018-10-03 NOTE — Progress Notes (Signed)
Received disability paperwork via Fax from [place] and forwarded these to [place] in [place] with patient's request to complete these today.

## 2018-10-03 NOTE — Progress Notes (Addendum)
[facility] OFFICE PROGRESS NOTE   Diagnosis:  Metastatic adenocarcinoma  INTERVAL HISTORY:   Mr. Jaquith returns as scheduled.  He overall feels well.  He has occasional abdominal pain.  No nausea or vomiting.  No diarrhea.  No significant shortness of breath.  No cough or fever.  Objective:  Vital signs in last 24 hours:  Blood pressure (!) 106/58, pulse 87, temperature 98.7 F (37.1 C), temperature source Oral, resp. rate 18, height 5\' 10"  (1.778 m), weight 163 lb 6.4 oz (74.1 kg), SpO2 (!) 89 %.    Resp: Respirations even and unlabored. Vascular: No leg edema.  Calves soft and nontender. Neuro: Alert and oriented.   Lab Results:  Lab Results  Component Value Date   WBC 9.7 09/09/2018   HGB 17.2 (H) 09/09/2018   HCT 49.8 09/09/2018   MCV 91.9 09/09/2018   PLT 221 09/09/2018   NEUTROABS 8.4 (H) 09/08/2018    Imaging:  No results found.  Medications: I have reviewed the patient's current medications.  Assessment/Plan: 1. Pancreas mass, liver lesions  Abdominal ultrasound 09/03/2018-3 liver lesions.  Intra-and extrahepatic biliary ductal dilatation.  Presentation to the emergency department 09/08/2018 with painless jaundice.  Initial bilirubin returned at 18.3.    MRI abdomen 09/08/2018-1.9 x 2.6 cm hypoenhancing lesion arising exophytically along the posterior aspect of the pancreatic head suspicious for pancreatic adenocarcinoma.  There was suspected involvement of the distal common duct with secondary intrahepatic/extrahepatic ductal dilatation.  Lesion noted to abut the IVC.  Multiple liver metastases noted.  Small upper abdominal/retroperitoneal lymph nodes noted.    ERCP 09/09/2018-high-grade stricture and obstruction of the distal common bile duct.  A metallic biliary stent was placed.  Brushings in the mid and lower third of the main bile duct were obtained with no malignant cells identified.    Bilirubin improved at 4.7 on 09/10/2018.    Biopsy  of a liver lesion on 09/11/2018-metastatic carcinoma, cytokeratin 7 and TTF-1 positive, negative for cytokeratin 20, CDX 2, and PSA.  CT chest 09/22/2018- right hilar/mediastinal/retrocrural lymphadenopathy, ill-defined right lower lobe nodularity, emphysema 2. Biliary obstruction status post stent placement 3. COPD 4. Hypertension   Disposition: Mr. Diesel appears stable.  The mass obstructing the bile duct is felt to most likely be adenopathy rather than a primary mass of the pancreas.  The immunohistochemical profile on the liver biopsy is most consistent with lung cancer.  Dr. Benay Spice recommends beginning treatment for metastatic non-small cell lung cancer with carboplatin/Alimta/pembrolizumab on a 3-week schedule.  Mr. Pierpoint agrees with this plan.  We reviewed potential toxicities associated with the chemotherapy including bone marrow toxicity, nausea/vomiting, hair loss, allergic reaction.  We reviewed the rationale for folic acid and Z00 related to Alimta.  We reviewed potential toxicities associated with Pembrolizumab including allergic reaction, rash, colitis, hepatitis, pneumonitis, thyroid dysfunction, arthritis symptoms, diabetes.  He will attend a chemotherapy education class.  He received a B12 injection in the office today.  Prescription was sent to his pharmacy for folic acid 1 mg daily.  A prescription was also sent for Compazine as needed.  He will return to begin cycle 1 carboplatin/Alimta/pembrolizumab on 10/09/2018.  We will see him in follow-up prior to cycle 2 on 10/30/2018.  He will contact the office in the interim with any problems.  Patient seen with Dr. Benay Spice.  25 minutes were spent face-to-face at today's visit with the majority of that time involved in counseling/coordination of care.    Ned Card ANP/GNP-BC  10/03/2018  11:57 AM  This was a shared visit with Ned Card.  I discussed treatment options with Mr. Schar by telephone earlier this week and again  today.  I reviewed the abdomen MRI with several radiologist.  The consensus is the mass obstructing the bile duct is not in the pancreas.  The finding of a mass outside of the pancreas and the tumor immunohistochemical stains are more consistent with a diagnosis of lung cancer and pancreas cancer.  I discussed the case with Dr. Melina Copa.  She indicates the tumor does not appear as a "small cell "carcinoma.  Mr. Klugh understands we do not have a clear primary tumor site, but non-small cell lung cancer is most likely.  I recommend treatment with chemotherapy and immunotherapy.  The plan is to begin Alimta/carboplatin/pembrolizumab within the next week.  We reviewed potential toxicities associated with the chemotherapy regimen and pembrolizumab.  He will attend a chemotherapy teaching class.  He agrees to proceed.  The plan is to deliver 3 cycles of systemic therapy prior to a restaging evaluation.  Julieanne Manson, MD

## 2018-10-03 NOTE — Progress Notes (Signed)
START ON PATHWAY REGIMEN - Non-Small Cell Lung     A cycle is every 21 days:     Pembrolizumab      Pemetrexed      Carboplatin   **Always confirm dose/schedule in your pharmacy ordering system**  Patient Characteristics: Stage IV Metastatic, Nonsquamous, Initial Chemotherapy/Immunotherapy, PS = 0, 1, ALK Translocation Negative/Unknown and EGFR Mutation Negative/Non-Sensitizing/Unknown, PD-L1 Expression Positive 1-49% (TPS) / Negative / Not Tested / Awaiting Test Results  and Immunotherapy Candidate AJCC T Category: Staged < 8th Ed. Current Disease Status: Distant Metastases AJCC N Category: Staged < 8th Ed. AJCC M Category: Staged < 8th Ed. AJCC 8 Stage Grouping: Staged < 8th Ed. Histology: Nonsquamous Cell ROS1 Rearrangement Status: Awaiting Test Results T790M Mutation Status: Not Applicable - EGFR Mutation Negative/Unknown Other Mutations/Biomarkers: No Other Actionable Mutations NTRK Gene Fusion Status: Awaiting Test Results PD-L1 Expression Status: Awaiting Test Results Chemotherapy/Immunotherapy LOT: Initial Chemotherapy/Immunotherapy Molecular Targeted Therapy: Not Appropriate ALK Translocation Status: Awaiting Test Results EGFR Mutation Status: Awaiting Test Results BRAF V600E Mutation Status: Awaiting Test Results ECOG Performance Status: 1 Immunotherapy Candidate Status: Candidate for Immunotherapy Intent of Therapy: Non-Curative / Palliative Intent, Discussed with Patient

## 2018-10-03 NOTE — Patient Instructions (Signed)
Provided patient printed handouts on Alimta, Carboplatin and Keytruda. Education Nurse will call him for session.

## 2018-10-03 NOTE — Telephone Encounter (Signed)
Called and scheduled appt per 4/10 los.  Patient aware that the patient education class is a phone visit.

## 2018-10-05 ENCOUNTER — Other Ambulatory Visit: Payer: Self-pay | Admitting: Oncology

## 2018-10-06 ENCOUNTER — Telehealth: Payer: Self-pay | Admitting: *Deleted

## 2018-10-06 ENCOUNTER — Inpatient Hospital Stay: Payer: 59

## 2018-10-09 ENCOUNTER — Other Ambulatory Visit: Payer: Self-pay | Admitting: Oncology

## 2018-10-09 ENCOUNTER — Other Ambulatory Visit: Payer: Self-pay

## 2018-10-09 ENCOUNTER — Inpatient Hospital Stay: Payer: 59

## 2018-10-09 ENCOUNTER — Telehealth: Payer: Self-pay | Admitting: *Deleted

## 2018-10-09 VITALS — BP 129/67 | HR 78 | Temp 98.6°F | Resp 17 | Ht 70.0 in | Wt 163.8 lb

## 2018-10-09 DIAGNOSIS — C3490 Malignant neoplasm of unspecified part of unspecified bronchus or lung: Secondary | ICD-10-CM

## 2018-10-09 DIAGNOSIS — Z5112 Encounter for antineoplastic immunotherapy: Secondary | ICD-10-CM | POA: Diagnosis not present

## 2018-10-09 DIAGNOSIS — K8689 Other specified diseases of pancreas: Secondary | ICD-10-CM

## 2018-10-09 LAB — CBC WITH DIFFERENTIAL (CANCER CENTER ONLY)
Abs Immature Granulocytes: 0.05 10*3/uL (ref 0.00–0.07)
Basophils Absolute: 0.1 10*3/uL (ref 0.0–0.1)
Basophils Relative: 1 %
Eosinophils Absolute: 0.5 10*3/uL (ref 0.0–0.5)
Eosinophils Relative: 4 %
HCT: 53.8 % — ABNORMAL HIGH (ref 39.0–52.0)
Hemoglobin: 18.7 g/dL — ABNORMAL HIGH (ref 13.0–17.0)
Immature Granulocytes: 0 %
Lymphocytes Relative: 10 %
Lymphs Abs: 1.2 10*3/uL (ref 0.7–4.0)
MCH: 32 pg (ref 26.0–34.0)
MCHC: 34.8 g/dL (ref 30.0–36.0)
MCV: 92 fL (ref 80.0–100.0)
Monocytes Absolute: 1 10*3/uL (ref 0.1–1.0)
Monocytes Relative: 8 %
Neutro Abs: 9.3 10*3/uL — ABNORMAL HIGH (ref 1.7–7.7)
Neutrophils Relative %: 77 %
Platelet Count: 306 10*3/uL (ref 150–400)
RBC: 5.85 MIL/uL — ABNORMAL HIGH (ref 4.22–5.81)
RDW: 13.2 % (ref 11.5–15.5)
WBC Count: 12 10*3/uL — ABNORMAL HIGH (ref 4.0–10.5)
nRBC: 0 % (ref 0.0–0.2)

## 2018-10-09 LAB — CMP (CANCER CENTER ONLY)
ALT: 76 U/L — ABNORMAL HIGH (ref 0–44)
AST: 48 U/L — ABNORMAL HIGH (ref 15–41)
Albumin: 3.5 g/dL (ref 3.5–5.0)
Alkaline Phosphatase: 214 U/L — ABNORMAL HIGH (ref 38–126)
Anion gap: 12 (ref 5–15)
BUN: 11 mg/dL (ref 8–23)
CO2: 23 mmol/L (ref 22–32)
Calcium: 9 mg/dL (ref 8.9–10.3)
Chloride: 99 mmol/L (ref 98–111)
Creatinine: 0.73 mg/dL (ref 0.61–1.24)
GFR, Est AFR Am: >60 mL/min (ref >60)
GFR, Estimated: >60 mL/min (ref >60)
Glucose, Bld: 134 mg/dL — ABNORMAL HIGH (ref 70–99)
Potassium: 3.7 mmol/L (ref 3.5–5.1)
Sodium: 134 mmol/L — ABNORMAL LOW (ref 135–145)
Total Bilirubin: 1.3 mg/dL — ABNORMAL HIGH (ref 0.3–1.2)
Total Protein: 6.9 g/dL (ref 6.5–8.1)

## 2018-10-09 MED ORDER — PALONOSETRON HCL INJECTION 0.25 MG/5ML
0.2500 mg | Freq: Once | INTRAVENOUS | Status: AC
  Administered 2018-10-09: 0.25 mg via INTRAVENOUS

## 2018-10-09 MED ORDER — SODIUM CHLORIDE 0.9 % IV SOLN
200.0000 mg | Freq: Once | INTRAVENOUS | Status: AC
  Administered 2018-10-09: 200 mg via INTRAVENOUS
  Filled 2018-10-09: qty 8

## 2018-10-09 MED ORDER — SODIUM CHLORIDE 0.9 % IV SOLN
464.5000 mg | Freq: Once | INTRAVENOUS | Status: AC
  Administered 2018-10-09: 460 mg via INTRAVENOUS
  Filled 2018-10-09: qty 46

## 2018-10-09 MED ORDER — SODIUM CHLORIDE 0.9 % IV SOLN
Freq: Once | INTRAVENOUS | Status: AC
  Administered 2018-10-09: 09:00:00 via INTRAVENOUS
  Filled 2018-10-09: qty 250

## 2018-10-09 MED ORDER — SODIUM CHLORIDE 0.9 % IV SOLN
Freq: Once | INTRAVENOUS | Status: AC
  Administered 2018-10-09: 09:00:00 via INTRAVENOUS
  Filled 2018-10-09: qty 5

## 2018-10-09 MED ORDER — PALONOSETRON HCL INJECTION 0.25 MG/5ML
INTRAVENOUS | Status: AC
  Filled 2018-10-09: qty 5

## 2018-10-09 MED ORDER — SODIUM CHLORIDE 0.9 % IV SOLN
520.0000 mg/m2 | Freq: Once | INTRAVENOUS | Status: AC
  Administered 2018-10-09: 1000 mg via INTRAVENOUS
  Filled 2018-10-09: qty 40

## 2018-10-09 NOTE — Telephone Encounter (Signed)
Discussed Keytruda, Alimta, Carboplatin with pt & provided written information & binder of information that was discussed at previous phone patient education.  Pt expressed appreciation & questions were answered.

## 2018-10-09 NOTE — Patient Instructions (Signed)
Coronavirus (COVID-19) Are you at risk?  Are you at risk for the Coronavirus (COVID-19)?  To be considered HIGH RISK for Coronavirus (COVID-19), you have to meet the following criteria:  . Traveled to [place], [place], [place], [place] or [place]; or in the [place] to [place], [place], [place], or [place]; and have fever, cough, and shortness of breath within the last 2 weeks of travel OR . Been in close contact with a person diagnosed with COVID-19 within the last 2 weeks and have fever, cough, and shortness of breath . IF YOU DO NOT MEET THESE CRITERIA, YOU ARE CONSIDERED LOW RISK FOR COVID-19.  What to do if you are HIGH RISK for COVID-19?  . If you are having a medical emergency, call 911. . Seek medical care right away. Before you go to a doctor's office, urgent care or emergency department, call ahead and tell them about your recent travel, contact with someone diagnosed with COVID-19, and your symptoms. You should receive instructions from your physician's office regarding next steps of care.  . When you arrive at healthcare provider, tell the healthcare staff immediately you have returned from visiting [place], [place], [place], [place] or [place]; or traveled in the [place] to [place], [place], [place], or [place]; in the last two weeks or you have been in close contact with a person diagnosed with COVID-19 in the last 2 weeks.   . Tell the health care staff about your symptoms: fever, cough and shortness of breath. . After you have been seen by a medical provider, you will be either: o Tested for (COVID-19) and discharged home on quarantine except to seek medical care if symptoms worsen, and asked to  - Stay home and avoid contact with others until you get your results (4-5 days)  - Avoid travel on public transportation if possible (such as bus, train, or airplane) or o Sent to the Emergency Department by EMS for evaluation, COVID-19 testing, and possible  admission depending on your condition and test results.  What to do if you are LOW RISK for COVID-19?  Reduce your risk of any infection by using the same precautions used for avoiding the common cold or flu:  . Wash your hands often with soap and warm water for at least 20 seconds.  If soap and water are not readily available, use an alcohol-based hand sanitizer with at least 60% alcohol.  . If coughing or sneezing, cover your mouth and nose by coughing or sneezing into the elbow areas of your shirt or coat, into a tissue or into your sleeve (not your hands). . Avoid shaking hands with others and consider head nods or verbal greetings only. . Avoid touching your eyes, nose, or mouth with unwashed hands.  . Avoid close contact with people who are sick. . Avoid places or events with large numbers of people in one location, like concerts or sporting events. . Carefully consider travel plans you have or are making. . If you are planning any travel outside or inside the [place], visit the CDC's Travelers' Health webpage for the latest health notices. . If you have some symptoms but not all symptoms, continue to monitor at home and seek medical attention if your symptoms worsen. . If you are having a medical emergency, call 911.   ADDITIONAL HEALTHCARE OPTIONS FOR PATIENTS  [facility] / e-Visit: [email or web address]         [facility]: [phone]  [facility]: [facility]: [facility] Discharge Instructions for Patients Receiving Chemotherapy  Today you received the following chemotherapy agents: Pembrolizumab Beryle Flock), Pemetrexed (Alimta), and Carboplatin (Paraplatin)  To help prevent nausea and vomiting after your treatment, we encourage you to take your nausea medication as directed.    If you develop nausea and vomiting that is not  controlled by your nausea medication, call the clinic.   BELOW ARE SYMPTOMS THAT SHOULD BE REPORTED IMMEDIATELY:  *FEVER GREATER THAN 100.5 F  *CHILLS WITH OR WITHOUT FEVER  NAUSEA AND VOMITING THAT IS NOT CONTROLLED WITH YOUR NAUSEA MEDICATION  *UNUSUAL SHORTNESS OF BREATH  *UNUSUAL BRUISING OR BLEEDING  TENDERNESS IN MOUTH AND THROAT WITH OR WITHOUT PRESENCE OF ULCERS  *URINARY PROBLEMS  *BOWEL PROBLEMS  UNUSUAL RASH Items with * indicate a potential emergency and should be followed up as soon as possible.  Feel free to call the clinic should you have any questions or concerns. The clinic phone number is [phone].  Please show the [facility] at check-in to the Emergency Department and triage nurse.  Pembrolizumab injection What is this medicine? PEMBROLIZUMAB (pem broe liz ue mab) is a monoclonal antibody. It is used to treat cervical cancer, esophageal cancer, head and neck cancer, hepatocellular cancer, Hodgkin lymphoma, kidney cancer, lymphoma, melanoma, Merkel cell carcinoma, lung cancer, stomach cancer, urothelial cancer, and cancers that have a certain genetic condition. This medicine may be used for other purposes; ask your health care provider or pharmacist if you have questions. COMMON BRAND NAME(S): Keytruda What should I tell my health care provider before I take this medicine? They need to know if you have any of these conditions: -diabetes -immune system problems -inflammatory bowel disease -liver disease -lung or breathing disease -lupus -received or scheduled to receive an organ transplant or a stem-cell transplant that uses donor stem cells -an unusual or allergic reaction to pembrolizumab, other medicines, foods, dyes, or preservatives -pregnant or trying to get pregnant -breast-feeding How should I use this medicine? This medicine is for infusion into a vein. It is given by a health care professional in a hospital or clinic setting. A  special MedGuide will be given to you before each treatment. Be sure to read this information carefully each time. Talk to your pediatrician regarding the use of this medicine in children. While this drug may be prescribed for selected conditions, precautions do apply. Overdosage: If you think you have taken too much of this medicine contact a poison control center or emergency room at once. NOTE: This medicine is only for you. Do not share this medicine with others. What if I miss a dose? It is important not to miss your dose. Call your doctor or health care professional if you are unable to keep an appointment. What may interact with this medicine? Interactions have not been studied. Give your health care provider a list of all the medicines, herbs, non-prescription drugs, or dietary supplements you use. Also tell them if you smoke, drink alcohol, or use illegal drugs. Some items may interact with your medicine. This list may not describe all possible interactions. Give your health care provider a list of all the medicines, herbs, non-prescription drugs, or dietary supplements you use. Also tell them if you smoke, drink alcohol, or use illegal drugs. Some items may interact with your medicine. What should  I watch for while using this medicine? Your condition will be monitored carefully while you are receiving this medicine. You may need blood work done while you are taking this medicine. Do not become pregnant while taking this medicine or for 4 months after stopping it. Women should inform their doctor if they wish to become pregnant or think they might be pregnant. There is a potential for serious side effects to an unborn child. Talk to your health care professional or pharmacist for more information. Do not breast-feed an infant while taking this medicine or for 4 months after the last dose. What side effects may I notice from receiving this medicine? Side effects that you should report to your  doctor or health care professional as soon as possible: -allergic reactions like skin rash, itching or hives, swelling of the face, lips, or tongue -bloody or black, tarry -breathing problems -changes in vision -chest pain -chills -confusion -constipation -cough -diarrhea -dizziness or feeling faint or lightheaded -fast or irregular heartbeat -fever -flushing -hair loss -joint pain -low blood counts - this medicine may decrease the number of white blood cells, red blood cells and platelets. You may be at increased risk for infections and bleeding. -muscle pain -muscle weakness -persistent headache -redness, blistering, peeling or loosening of the skin, including inside the mouth -signs and symptoms of high blood sugar such as dizziness; dry mouth; dry skin; fruity breath; nausea; stomach pain; increased hunger or thirst; increased urination -signs and symptoms of kidney injury like trouble passing urine or change in the amount of urine -signs and symptoms of liver injury like dark urine, light-colored stools, loss of appetite, nausea, right upper belly pain, yellowing of the eyes or skin -sweating -swollen lymph nodes -weight loss Side effects that usually do not require medical attention (report to your doctor or health care professional if they continue or are bothersome): -decreased appetite -muscle pain -tiredness This list may not describe all possible side effects. Call your doctor for medical advice about side effects. You may report side effects to FDA at 1-800-FDA-1088. Where should I keep my medicine? This drug is given in a hospital or clinic and will not be stored at home. NOTE: This sheet is a summary. It may not cover all possible information. If you have questions about this medicine, talk to your doctor, pharmacist, or health care provider.  2019 Elsevier/Gold Standard (2018-01-23 15:06:10)  Pemetrexed injection What is this medicine? PEMETREXED (PEM e TREX  ed) is a chemotherapy drug used to treat lung cancers like non-small cell lung cancer and mesothelioma. It may also be used to treat other cancers. This medicine may be used for other purposes; ask your health care provider or pharmacist if you have questions. COMMON BRAND NAME(S): Alimta What should I tell my health care provider before I take this medicine? They need to know if you have any of these conditions: -infection (especially a virus infection such as chickenpox, cold sores, or herpes) -kidney disease -low blood counts, like low white cell, platelet, or red cell counts -lung or breathing disease, like asthma -radiation therapy -an unusual or allergic reaction to pemetrexed, other medicines, foods, dyes, or preservative -pregnant or trying to get pregnant -breast-feeding How should I use this medicine? This drug is given as an infusion into a vein. It is administered in a hospital or clinic by a specially trained health care professional. Talk to your pediatrician regarding the use of this medicine in children. Special care may be needed. Overdosage: If you  think you have taken too much of this medicine contact a poison control center or emergency room at once. NOTE: This medicine is only for you. Do not share this medicine with others. What if I miss a dose? It is important not to miss your dose. Call your doctor or health care professional if you are unable to keep an appointment. What may interact with this medicine? This medicine may interact with the following medications: -Ibuprofen This list may not describe all possible interactions. Give your health care provider a list of all the medicines, herbs, non-prescription drugs, or dietary supplements you use. Also tell them if you smoke, drink alcohol, or use illegal drugs. Some items may interact with your medicine. What should I watch for while using this medicine? Visit your doctor for checks on your progress. This drug may  make you feel generally unwell. This is not uncommon, as chemotherapy can affect healthy cells as well as cancer cells. Report any side effects. Continue your course of treatment even though you feel ill unless your doctor tells you to stop. In some cases, you may be given additional medicines to help with side effects. Follow all directions for their use. Call your doctor or health care professional for advice if you get a fever, chills or sore throat, or other symptoms of a cold or flu. Do not treat yourself. This drug decreases your body's ability to fight infections. Try to avoid being around people who are sick. This medicine may increase your risk to bruise or bleed. Call your doctor or health care professional if you notice any unusual bleeding. Be careful brushing and flossing your teeth or using a toothpick because you may get an infection or bleed more easily. If you have any dental work done, tell your dentist you are receiving this medicine. Avoid taking products that contain aspirin, acetaminophen, ibuprofen, naproxen, or ketoprofen unless instructed by your doctor. These medicines may hide a fever. Call your doctor or health care professional if you get diarrhea or mouth sores. Do not treat yourself. To protect your kidneys, drink water or other fluids as directed while you are taking this medicine. Do not become pregnant while taking this medicine or for 6 months after stopping it. Women should inform their doctor if they wish to become pregnant or think they might be pregnant. Men should not father a child while taking this medicine and for 3 months after stopping it. This may interfere with the ability to father a child. You should talk to your doctor or health care professional if you are concerned about your fertility. There is a potential for serious side effects to an unborn child. Talk to your health care professional or pharmacist for more information. Do not breast-feed an infant  while taking this medicine or for 1 week after stopping it. What side effects may I notice from receiving this medicine? Side effects that you should report to your doctor or health care professional as soon as possible: -allergic reactions like skin rash, itching or hives, swelling of the face, lips, or tongue -breathing problems -redness, blistering, peeling or loosening of the skin, including inside the mouth -signs and symptoms of bleeding such as bloody or black, tarry stools; red or dark-brown urine; spitting up blood or brown material that looks like coffee grounds; red spots on the skin; unusual bruising or bleeding from the eye, gums, or nose -signs and symptoms of infection like fever or chills; cough; sore throat; pain or trouble passing urine -  signs and symptoms of kidney injury like trouble passing urine or change in the amount of urine -signs and symptoms of liver injury like dark yellow or brown urine; general ill feeling or flu-like symptoms; light-colored stools; loss of appetite; nausea; right upper belly pain; unusually weak or tired; yellowing of the eyes or skin Side effects that usually do not require medical attention (report to your doctor or health care professional if they continue or are bothersome): -constipation -mouth sores -nausea, vomiting -unusually weak or tired This list may not describe all possible side effects. Call your doctor for medical advice about side effects. You may report side effects to FDA at 1-800-FDA-1088. Where should I keep my medicine? This drug is given in a hospital or clinic and will not be stored at home. NOTE: This sheet is a summary. It may not cover all possible information. If you have questions about this medicine, talk to your doctor, pharmacist, or health care provider.  2019 Elsevier/Gold Standard (2017-07-31 16:11:33)  Carboplatin injection What is this medicine? CARBOPLATIN (KAR boe pla tin) is a chemotherapy drug. It targets  fast dividing cells, like cancer cells, and causes these cells to die. This medicine is used to treat ovarian cancer and many other cancers. This medicine may be used for other purposes; ask your health care provider or pharmacist if you have questions. COMMON BRAND NAME(S): Paraplatin What should I tell my health care provider before I take this medicine? They need to know if you have any of these conditions: -blood disorders -hearing problems -kidney disease -recent or ongoing radiation therapy -an unusual or allergic reaction to carboplatin, cisplatin, other chemotherapy, other medicines, foods, dyes, or preservatives -pregnant or trying to get pregnant -breast-feeding How should I use this medicine? This drug is usually given as an infusion into a vein. It is administered in a hospital or clinic by a specially trained health care professional. Talk to your pediatrician regarding the use of this medicine in children. Special care may be needed. Overdosage: If you think you have taken too much of this medicine contact a poison control center or emergency room at once. NOTE: This medicine is only for you. Do not share this medicine with others. What if I miss a dose? It is important not to miss a dose. Call your doctor or health care professional if you are unable to keep an appointment. What may interact with this medicine? -medicines for seizures -medicines to increase blood counts like filgrastim, pegfilgrastim, sargramostim -some antibiotics like amikacin, gentamicin, neomycin, streptomycin, tobramycin -vaccines Talk to your doctor or health care professional before taking any of these medicines: -acetaminophen -aspirin -ibuprofen -ketoprofen -naproxen This list may not describe all possible interactions. Give your health care provider a list of all the medicines, herbs, non-prescription drugs, or dietary supplements you use. Also tell them if you smoke, drink alcohol, or use  illegal drugs. Some items may interact with your medicine. What should I watch for while using this medicine? Your condition will be monitored carefully while you are receiving this medicine. You will need important blood work done while you are taking this medicine. This drug may make you feel generally unwell. This is not uncommon, as chemotherapy can affect healthy cells as well as cancer cells. Report any side effects. Continue your course of treatment even though you feel ill unless your doctor tells you to stop. In some cases, you may be given additional medicines to help with side effects. Follow all directions for their use.  Call your doctor or health care professional for advice if you get a fever, chills or sore throat, or other symptoms of a cold or flu. Do not treat yourself. This drug decreases your body's ability to fight infections. Try to avoid being around people who are sick. This medicine may increase your risk to bruise or bleed. Call your doctor or health care professional if you notice any unusual bleeding. Be careful brushing and flossing your teeth or using a toothpick because you may get an infection or bleed more easily. If you have any dental work done, tell your dentist you are receiving this medicine. Avoid taking products that contain aspirin, acetaminophen, ibuprofen, naproxen, or ketoprofen unless instructed by your doctor. These medicines may hide a fever. Do not become pregnant while taking this medicine. Women should inform their doctor if they wish to become pregnant or think they might be pregnant. There is a potential for serious side effects to an unborn child. Talk to your health care professional or pharmacist for more information. Do not breast-feed an infant while taking this medicine. What side effects may I notice from receiving this medicine? Side effects that you should report to your doctor or health care professional as soon as possible: -allergic  reactions like skin rash, itching or hives, swelling of the face, lips, or tongue -signs of infection - fever or chills, cough, sore throat, pain or difficulty passing urine -signs of decreased platelets or bleeding - bruising, pinpoint red spots on the skin, black, tarry stools, nosebleeds -signs of decreased red blood cells - unusually weak or tired, fainting spells, lightheadedness -breathing problems -changes in hearing -changes in vision -chest pain -high blood pressure -low blood counts - This drug may decrease the number of white blood cells, red blood cells and platelets. You may be at increased risk for infections and bleeding. -nausea and vomiting -pain, swelling, redness or irritation at the injection site -pain, tingling, numbness in the hands or feet -problems with balance, talking, walking -trouble passing urine or change in the amount of urine Side effects that usually do not require medical attention (report to your doctor or health care professional if they continue or are bothersome): -hair loss -loss of appetite -metallic taste in the mouth or changes in taste This list may not describe all possible side effects. Call your doctor for medical advice about side effects. You may report side effects to FDA at 1-800-FDA-1088. Where should I keep my medicine? This drug is given in a hospital or clinic and will not be stored at home. NOTE: This sheet is a summary. It may not cover all possible information. If you have questions about this medicine, talk to your doctor, pharmacist, or health care provider.  2019 Elsevier/Gold Standard (2007-09-16 14:38:05)

## 2018-10-10 ENCOUNTER — Encounter: Payer: Self-pay | Admitting: General Practice

## 2018-10-10 ENCOUNTER — Telehealth: Payer: Self-pay | Admitting: *Deleted

## 2018-10-10 NOTE — Telephone Encounter (Signed)
Called to follow up status since 1st chemo on 10/09/18. Patient reports he may be slightly more fatigued, but he has no nausea and eating and drinking well. Plans to write down all the OTC supplements he takes and bring to next visit to be sure it is OK to take them during chemotherapy.

## 2018-10-10 NOTE — Progress Notes (Signed)
[facility] Spiritual Care Note  Reached Ricky Conway by phone per referral from [facility], who was his chemo nurse yesterday and saw him in emotional distress. He was very welcoming of phone call and used opportunity to process all that he is doing to take care of himself per his care plan. He appeared to be in better spirits today than he was during tx, perhaps in part because of his taking control of what he can. We plan to f/u by phone next week per his request, and he plans to encourage his wife to speak to me, too, when I call. He also knows to contact me by phone as needed/desired, but please always page or inbasket if additional needs arise. Thank you.   [place], [place], [facility] Pager [phone] Voicemail [phone]

## 2018-10-26 ENCOUNTER — Other Ambulatory Visit: Payer: Self-pay | Admitting: Oncology

## 2018-10-30 ENCOUNTER — Inpatient Hospital Stay (HOSPITAL_BASED_OUTPATIENT_CLINIC_OR_DEPARTMENT_OTHER): Payer: 59 | Admitting: Nurse Practitioner

## 2018-10-30 ENCOUNTER — Telehealth: Payer: Self-pay | Admitting: Nurse Practitioner

## 2018-10-30 ENCOUNTER — Inpatient Hospital Stay: Payer: 59 | Attending: Oncology

## 2018-10-30 ENCOUNTER — Encounter: Payer: Self-pay | Admitting: Nurse Practitioner

## 2018-10-30 ENCOUNTER — Inpatient Hospital Stay: Payer: 59

## 2018-10-30 ENCOUNTER — Other Ambulatory Visit: Payer: Self-pay

## 2018-10-30 VITALS — BP 114/64 | HR 80 | Temp 97.9°F | Resp 18 | Ht 70.0 in | Wt 166.6 lb

## 2018-10-30 DIAGNOSIS — C787 Secondary malignant neoplasm of liver and intrahepatic bile duct: Secondary | ICD-10-CM | POA: Diagnosis not present

## 2018-10-30 DIAGNOSIS — Z5112 Encounter for antineoplastic immunotherapy: Secondary | ICD-10-CM | POA: Insufficient documentation

## 2018-10-30 DIAGNOSIS — C801 Malignant (primary) neoplasm, unspecified: Secondary | ICD-10-CM | POA: Diagnosis not present

## 2018-10-30 DIAGNOSIS — K831 Obstruction of bile duct: Secondary | ICD-10-CM | POA: Diagnosis not present

## 2018-10-30 DIAGNOSIS — Z5111 Encounter for antineoplastic chemotherapy: Secondary | ICD-10-CM | POA: Diagnosis present

## 2018-10-30 DIAGNOSIS — C3490 Malignant neoplasm of unspecified part of unspecified bronchus or lung: Secondary | ICD-10-CM

## 2018-10-30 DIAGNOSIS — C3431 Malignant neoplasm of lower lobe, right bronchus or lung: Secondary | ICD-10-CM | POA: Diagnosis not present

## 2018-10-30 DIAGNOSIS — J449 Chronic obstructive pulmonary disease, unspecified: Secondary | ICD-10-CM

## 2018-10-30 DIAGNOSIS — I10 Essential (primary) hypertension: Secondary | ICD-10-CM

## 2018-10-30 DIAGNOSIS — K8689 Other specified diseases of pancreas: Secondary | ICD-10-CM

## 2018-10-30 LAB — CBC WITH DIFFERENTIAL (CANCER CENTER ONLY)
Abs Immature Granulocytes: 0.04 10*3/uL (ref 0.00–0.07)
Basophils Absolute: 0.1 10*3/uL (ref 0.0–0.1)
Basophils Relative: 1 %
Eosinophils Absolute: 0.2 10*3/uL (ref 0.0–0.5)
Eosinophils Relative: 2 %
HCT: 51 % (ref 39.0–52.0)
Hemoglobin: 17.6 g/dL — ABNORMAL HIGH (ref 13.0–17.0)
Immature Granulocytes: 1 %
Lymphocytes Relative: 18 %
Lymphs Abs: 1.2 10*3/uL (ref 0.7–4.0)
MCH: 32.4 pg (ref 26.0–34.0)
MCHC: 34.5 g/dL (ref 30.0–36.0)
MCV: 93.8 fL (ref 80.0–100.0)
Monocytes Absolute: 1.1 10*3/uL — ABNORMAL HIGH (ref 0.1–1.0)
Monocytes Relative: 16 %
Neutro Abs: 4.5 10*3/uL (ref 1.7–7.7)
Neutrophils Relative %: 62 %
Platelet Count: 347 10*3/uL (ref 150–400)
RBC: 5.44 MIL/uL (ref 4.22–5.81)
RDW: 14.6 % (ref 11.5–15.5)
WBC Count: 7.1 10*3/uL (ref 4.0–10.5)
nRBC: 0 % (ref 0.0–0.2)

## 2018-10-30 LAB — CMP (CANCER CENTER ONLY)
ALT: 54 U/L — ABNORMAL HIGH (ref 0–44)
AST: 35 U/L (ref 15–41)
Albumin: 3.6 g/dL (ref 3.5–5.0)
Alkaline Phosphatase: 141 U/L — ABNORMAL HIGH (ref 38–126)
Anion gap: 11 (ref 5–15)
BUN: 9 mg/dL (ref 8–23)
CO2: 26 mmol/L (ref 22–32)
Calcium: 9 mg/dL (ref 8.9–10.3)
Chloride: 100 mmol/L (ref 98–111)
Creatinine: 0.67 mg/dL (ref 0.61–1.24)
GFR, Est AFR Am: >60 mL/min (ref >60)
GFR, Estimated: >60 mL/min (ref >60)
Glucose, Bld: 100 mg/dL — ABNORMAL HIGH (ref 70–99)
Potassium: 3.8 mmol/L (ref 3.5–5.1)
Sodium: 137 mmol/L (ref 135–145)
Total Bilirubin: 0.8 mg/dL (ref 0.3–1.2)
Total Protein: 6.9 g/dL (ref 6.5–8.1)

## 2018-10-30 MED ORDER — SODIUM CHLORIDE 0.9 % IV SOLN
Freq: Once | INTRAVENOUS | Status: AC
  Administered 2018-10-30: 10:00:00 via INTRAVENOUS
  Filled 2018-10-30: qty 250

## 2018-10-30 MED ORDER — PALONOSETRON HCL INJECTION 0.25 MG/5ML
INTRAVENOUS | Status: AC
  Filled 2018-10-30: qty 5

## 2018-10-30 MED ORDER — SODIUM CHLORIDE 0.9 % IV SOLN
520.0000 mg/m2 | Freq: Once | INTRAVENOUS | Status: AC
  Administered 2018-10-30: 1000 mg via INTRAVENOUS
  Filled 2018-10-30: qty 40

## 2018-10-30 MED ORDER — PALONOSETRON HCL INJECTION 0.25 MG/5ML
0.2500 mg | Freq: Once | INTRAVENOUS | Status: AC
  Administered 2018-10-30: 0.25 mg via INTRAVENOUS

## 2018-10-30 MED ORDER — SODIUM CHLORIDE 0.9 % IV SOLN
200.0000 mg | Freq: Once | INTRAVENOUS | Status: AC
  Administered 2018-10-30: 200 mg via INTRAVENOUS
  Filled 2018-10-30: qty 8

## 2018-10-30 MED ORDER — SODIUM CHLORIDE 0.9 % IV SOLN
464.5000 mg | Freq: Once | INTRAVENOUS | Status: AC
  Administered 2018-10-30: 460 mg via INTRAVENOUS
  Filled 2018-10-30: qty 46

## 2018-10-30 MED ORDER — SODIUM CHLORIDE 0.9 % IV SOLN
Freq: Once | INTRAVENOUS | Status: AC
  Administered 2018-10-30: 11:00:00 via INTRAVENOUS
  Filled 2018-10-30: qty 5

## 2018-10-30 NOTE — Patient Instructions (Signed)
Coronavirus (COVID-19) Are you at risk?  Are you at risk for the Coronavirus (COVID-19)?  To be considered HIGH RISK for Coronavirus (COVID-19), you have to meet the following criteria:  . Traveled to [place], [place], [place], [place] or [place]; or in the [place] to [place], [place], [place], or [place]; and have fever, cough, and shortness of breath within the last 2 weeks of travel OR . Been in close contact with a person diagnosed with COVID-19 within the last 2 weeks and have fever, cough, and shortness of breath . IF YOU DO NOT MEET THESE CRITERIA, YOU ARE CONSIDERED LOW RISK FOR COVID-19.  What to do if you are HIGH RISK for COVID-19?  . If you are having a medical emergency, call 911. . Seek medical care right away. Before you go to a doctor's office, urgent care or emergency department, call ahead and tell them about your recent travel, contact with someone diagnosed with COVID-19, and your symptoms. You should receive instructions from your physician's office regarding next steps of care.  . When you arrive at healthcare provider, tell the healthcare staff immediately you have returned from visiting [place], [place], [place], [place] or [place]; or traveled in the [place] to [place], [place], [place], or [place]; in the last two weeks or you have been in close contact with a person diagnosed with COVID-19 in the last 2 weeks.   . Tell the health care staff about your symptoms: fever, cough and shortness of breath. . After you have been seen by a medical provider, you will be either: o Tested for (COVID-19) and discharged home on quarantine except to seek medical care if symptoms worsen, and asked to  - Stay home and avoid contact with others until you get your results (4-5 days)  - Avoid travel on public transportation if possible (such as bus, train, or airplane) or o Sent to the Emergency Department by EMS for evaluation, COVID-19 testing, and possible  admission depending on your condition and test results.  What to do if you are LOW RISK for COVID-19?  Reduce your risk of any infection by using the same precautions used for avoiding the common cold or flu:  . Wash your hands often with soap and warm water for at least 20 seconds.  If soap and water are not readily available, use an alcohol-based hand sanitizer with at least 60% alcohol.  . If coughing or sneezing, cover your mouth and nose by coughing or sneezing into the elbow areas of your shirt or coat, into a tissue or into your sleeve (not your hands). . Avoid shaking hands with others and consider head nods or verbal greetings only. . Avoid touching your eyes, nose, or mouth with unwashed hands.  . Avoid close contact with people who are sick. . Avoid places or events with large numbers of people in one location, like concerts or sporting events. . Carefully consider travel plans you have or are making. . If you are planning any travel outside or inside the [place], visit the CDC's Travelers' Health webpage for the latest health notices. . If you have some symptoms but not all symptoms, continue to monitor at home and seek medical attention if your symptoms worsen. . If you are having a medical emergency, call 911.   ADDITIONAL HEALTHCARE OPTIONS FOR PATIENTS  Early Telehealth / e-Visit: [email or web address]         [facility]: [phone]  [place]   Urgent Care: [facility]: [place] Discharge Instructions for Patients Receiving Chemotherapy  Today you received the following chemotherapy agents: Pembrolizumab Beryle Flock), Pemetrexed (Alimta), and Carboplatin (Paraplatin)  To help prevent nausea and vomiting after your treatment, we encourage you to take your nausea medication as directed.    If you develop nausea and vomiting that is not  controlled by your nausea medication, call the clinic.   BELOW ARE SYMPTOMS THAT SHOULD BE REPORTED IMMEDIATELY:  *FEVER GREATER THAN 100.5 F  *CHILLS WITH OR WITHOUT FEVER  NAUSEA AND VOMITING THAT IS NOT CONTROLLED WITH YOUR NAUSEA MEDICATION  *UNUSUAL SHORTNESS OF BREATH  *UNUSUAL BRUISING OR BLEEDING  TENDERNESS IN MOUTH AND THROAT WITH OR WITHOUT PRESENCE OF ULCERS  *URINARY PROBLEMS  *BOWEL PROBLEMS  UNUSUAL RASH Items with * indicate a potential emergency and should be followed up as soon as possible.  Feel free to call the clinic should you have any questions or concerns. The clinic phone number is [phone].  Please show the [facility] at check-in to the Emergency Department and triage nurse.

## 2018-10-30 NOTE — Progress Notes (Signed)
  [facility] OFFICE PROGRESS NOTE   Diagnosis: Metastatic adenocarcinoma  INTERVAL HISTORY:   Ricky Conway returns as scheduled.  He completed cycle 1 carboplatin/Alimta/pembrolizumab 10/09/2018.  He feels he tolerated the first cycle well.  No nausea or vomiting.  No mouth sores.  No diarrhea.  Recent constipation.  He took MiraLAX and is now taking a stool softener.  Bowels moving regularly.  No rash.  No edema.  He has stable dyspnea on exertion.  No fever or cough.  Overall good appetite.  Objective:   Vital signs in last 24 hours:  Blood pressure 114/64, pulse 80, temperature 97.9 F (36.6 C), temperature source Oral, resp. rate 18, height 5\' 10"  (1.778 m), weight 166 lb 9.6 oz (75.6 kg), SpO2 90 %.    HEENT: No thrush or ulcers. Vascular: No leg edema. Skin: No rash.   Lab Results:  Lab Results  Component Value Date   WBC 7.1 10/30/2018   HGB 17.6 (H) 10/30/2018   HCT 51.0 10/30/2018   MCV 93.8 10/30/2018   PLT 347 10/30/2018   NEUTROABS 4.5 10/30/2018    Imaging:  No results found.  Medications: I have reviewed the patient's current medications.  Assessment/Plan: 1. Pancreas mass, liver lesions  Abdominal ultrasound 09/03/2018-3 liver lesions. Intra-and extrahepatic biliary ductal dilatation.  Presentation to the emergency department 09/08/2018 with painless jaundice. Initial bilirubin returned at 18.3.   MRI abdomen 09/08/2018-1.9 x 2.6 cm hypoenhancing lesion arising exophytically along the posterior aspect of the pancreatic head suspicious for pancreatic adenocarcinoma. There was suspected involvement of the distal common duct with secondary intrahepatic/extrahepatic ductal dilatation. Lesion noted to abut the IVC. Multiple liver metastases noted. Small upper abdominal/retroperitoneal lymph nodes noted.   ERCP 09/09/2018-high-grade stricture and obstruction of the distal common bile duct. A metallic biliary stent was placed. Brushings in  the mid and lower third of the main bile duct were obtained with no malignant cells identified.   Bilirubin improved at 4.7 on 09/10/2018.   Biopsy of a liver lesion on 09/11/2018-metastatic carcinoma, cytokeratin 7 and TTF-1 positive, negative for cytokeratin 20, CDX 2, and PSA.  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 COPD 4. Hypertension  Disposition: Ricky Conway appears stable.  He has completed 1 cycle of carboplatin/Alimta/pembrolizumab. Overall he seems to have tolerated well.  Plan to proceed with cycle 2 today as scheduled.  We reviewed the CBC from today.  Counts adequate to proceed with treatment.  He will return for lab, follow-up and cycle 3 in 3 weeks.  He will contact the office in the interim with any problems.    Ned Card ANP/GNP-BC   10/30/2018  9:24 AM

## 2018-10-30 NOTE — Telephone Encounter (Signed)
Scheduled appt per 5/7 los.

## 2018-11-17 ENCOUNTER — Other Ambulatory Visit: Payer: Self-pay | Admitting: Oncology

## 2018-11-20 ENCOUNTER — Inpatient Hospital Stay: Payer: 59

## 2018-11-20 ENCOUNTER — Inpatient Hospital Stay (HOSPITAL_BASED_OUTPATIENT_CLINIC_OR_DEPARTMENT_OTHER): Payer: 59 | Admitting: Nurse Practitioner

## 2018-11-20 ENCOUNTER — Encounter: Payer: Self-pay | Admitting: Nurse Practitioner

## 2018-11-20 ENCOUNTER — Other Ambulatory Visit: Payer: Self-pay

## 2018-11-20 VITALS — BP 120/67 | HR 80 | Temp 98.7°F | Resp 18 | Ht 70.0 in | Wt 167.0 lb

## 2018-11-20 DIAGNOSIS — C3490 Malignant neoplasm of unspecified part of unspecified bronchus or lung: Secondary | ICD-10-CM

## 2018-11-20 DIAGNOSIS — I10 Essential (primary) hypertension: Secondary | ICD-10-CM

## 2018-11-20 DIAGNOSIS — Z79899 Other long term (current) drug therapy: Secondary | ICD-10-CM

## 2018-11-20 DIAGNOSIS — C787 Secondary malignant neoplasm of liver and intrahepatic bile duct: Secondary | ICD-10-CM | POA: Diagnosis not present

## 2018-11-20 DIAGNOSIS — K831 Obstruction of bile duct: Secondary | ICD-10-CM

## 2018-11-20 DIAGNOSIS — Z5111 Encounter for antineoplastic chemotherapy: Secondary | ICD-10-CM | POA: Diagnosis not present

## 2018-11-20 DIAGNOSIS — J449 Chronic obstructive pulmonary disease, unspecified: Secondary | ICD-10-CM | POA: Diagnosis not present

## 2018-11-20 DIAGNOSIS — C3431 Malignant neoplasm of lower lobe, right bronchus or lung: Secondary | ICD-10-CM

## 2018-11-20 LAB — CMP (CANCER CENTER ONLY)
ALT: 37 U/L (ref 0–44)
AST: 29 U/L (ref 15–41)
Albumin: 3.5 g/dL (ref 3.5–5.0)
Alkaline Phosphatase: 99 U/L (ref 38–126)
Anion gap: 8 (ref 5–15)
BUN: 11 mg/dL (ref 8–23)
CO2: 26 mmol/L (ref 22–32)
Calcium: 8.9 mg/dL (ref 8.9–10.3)
Chloride: 102 mmol/L (ref 98–111)
Creatinine: 0.66 mg/dL (ref 0.61–1.24)
GFR, Est AFR Am: >60 mL/min (ref >60)
GFR, Estimated: >60 mL/min (ref >60)
Glucose, Bld: 92 mg/dL (ref 70–99)
Potassium: 4 mmol/L (ref 3.5–5.1)
Sodium: 136 mmol/L (ref 135–145)
Total Bilirubin: 0.6 mg/dL (ref 0.3–1.2)
Total Protein: 6.7 g/dL (ref 6.5–8.1)

## 2018-11-20 LAB — CBC WITH DIFFERENTIAL (CANCER CENTER ONLY)
Abs Immature Granulocytes: 0.02 10*3/uL (ref 0.00–0.07)
Basophils Absolute: 0 10*3/uL (ref 0.0–0.1)
Basophils Relative: 0 %
Eosinophils Absolute: 0.1 10*3/uL (ref 0.0–0.5)
Eosinophils Relative: 1 %
HCT: 45.8 % (ref 39.0–52.0)
Hemoglobin: 15.6 g/dL (ref 13.0–17.0)
Immature Granulocytes: 0 %
Lymphocytes Relative: 14 %
Lymphs Abs: 1 10*3/uL (ref 0.7–4.0)
MCH: 33 pg (ref 26.0–34.0)
MCHC: 34.1 g/dL (ref 30.0–36.0)
MCV: 96.8 fL (ref 80.0–100.0)
Monocytes Absolute: 1.2 10*3/uL — ABNORMAL HIGH (ref 0.1–1.0)
Monocytes Relative: 16 %
Neutro Abs: 4.8 10*3/uL (ref 1.7–7.7)
Neutrophils Relative %: 69 %
Platelet Count: 309 10*3/uL (ref 150–400)
RBC: 4.73 MIL/uL (ref 4.22–5.81)
RDW: 16.2 % — ABNORMAL HIGH (ref 11.5–15.5)
WBC Count: 7.1 10*3/uL (ref 4.0–10.5)
nRBC: 0 % (ref 0.0–0.2)

## 2018-11-20 LAB — TSH: TSH: 3.115 u[IU]/mL (ref 0.320–4.118)

## 2018-11-20 MED ORDER — PALONOSETRON HCL INJECTION 0.25 MG/5ML
INTRAVENOUS | Status: AC
  Filled 2018-11-20: qty 5

## 2018-11-20 MED ORDER — PALONOSETRON HCL INJECTION 0.25 MG/5ML
0.2500 mg | Freq: Once | INTRAVENOUS | Status: AC
  Administered 2018-11-20: 0.25 mg via INTRAVENOUS

## 2018-11-20 MED ORDER — SODIUM CHLORIDE 0.9 % IV SOLN
Freq: Once | INTRAVENOUS | Status: AC
  Administered 2018-11-20: 11:00:00 via INTRAVENOUS
  Filled 2018-11-20: qty 250

## 2018-11-20 MED ORDER — CYANOCOBALAMIN 1000 MCG/ML IJ SOLN
1000.0000 ug | Freq: Once | INTRAMUSCULAR | Status: AC
  Administered 2018-11-20: 1000 ug via INTRAMUSCULAR

## 2018-11-20 MED ORDER — SODIUM CHLORIDE 0.9 % IV SOLN
520.0000 mg/m2 | Freq: Once | INTRAVENOUS | Status: AC
  Administered 2018-11-20: 1000 mg via INTRAVENOUS
  Filled 2018-11-20: qty 40

## 2018-11-20 MED ORDER — SODIUM CHLORIDE 0.9 % IV SOLN
Freq: Once | INTRAVENOUS | Status: AC
  Administered 2018-11-20: 12:00:00 via INTRAVENOUS
  Filled 2018-11-20: qty 5

## 2018-11-20 MED ORDER — SODIUM CHLORIDE 0.9 % IV SOLN
464.5000 mg | Freq: Once | INTRAVENOUS | Status: AC
  Administered 2018-11-20: 460 mg via INTRAVENOUS
  Filled 2018-11-20: qty 46

## 2018-11-20 MED ORDER — CYANOCOBALAMIN 1000 MCG/ML IJ SOLN
INTRAMUSCULAR | Status: AC
  Filled 2018-11-20: qty 1

## 2018-11-20 MED ORDER — SODIUM CHLORIDE 0.9 % IV SOLN
200.0000 mg | Freq: Once | INTRAVENOUS | Status: AC
  Administered 2018-11-20: 200 mg via INTRAVENOUS
  Filled 2018-11-20: qty 8

## 2018-11-20 NOTE — Progress Notes (Signed)
  [place] OFFICE PROGRESS NOTE   Diagnosis: Metastatic adenocarcinoma  INTERVAL HISTORY:   Ricky Conway returns as scheduled.  He completed cycle 2 carboplatin/Alimta/pembrolizumab 10/30/2018.  He denies nausea/vomiting.  No mouth sores.  No diarrhea.  He had mild constipation a few days after the chemotherapy.  Bowels now moving regularly.  No rash.  No fever.  No change in baseline dyspnea on exertion, cough.  He has occasional pain at the left mid back.  Objective:  Vital signs in last 24 hours:  Blood pressure 120/67, pulse 80, temperature 98.7 F (37.1 C), temperature source Tympanic, resp. rate 18, height 5\' 10"  (1.778 m), weight 167 lb (75.8 kg), SpO2 (!) 88 %.    Vascular: No leg edema. Skin: No rash.   Lab Results:  Lab Results  Component Value Date   WBC 7.1 11/20/2018   HGB 15.6 11/20/2018   HCT 45.8 11/20/2018   MCV 96.8 11/20/2018   PLT 309 11/20/2018   NEUTROABS 4.8 11/20/2018    Imaging:  No results found.  Medications: I have reviewed the patient's current medications.  Assessment/Plan: 1. Pancreas mass, liver lesions  Abdominal ultrasound 09/03/2018-3 liver lesions. Intra-and extrahepatic biliary ductal dilatation.  Presentation to the emergency department 09/08/2018 with painless jaundice. Initial bilirubin returned at 18.3.   MRI abdomen 09/08/2018-1.9 x 2.6 cm hypoenhancing lesion arising exophytically along the posterior aspect of the pancreatic head suspicious for pancreatic adenocarcinoma. There was suspected involvement of the distal common duct with secondary intrahepatic/extrahepatic ductal dilatation. Lesion noted to abut the IVC. Multiple liver metastases noted. Small upper abdominal/retroperitoneal lymph nodes noted.   ERCP 09/09/2018-high-grade stricture and obstruction of the distal common bile duct. A metallic biliary stent was placed. Brushings in the mid and lower third of the main bile duct were obtained with no  malignant cells identified.   Bilirubin improved at 4.7 on 09/10/2018.   Biopsy of a liver lesion on 09/11/2018-metastatic carcinoma, cytokeratin 7 and TTF-1 positive, negative for cytokeratin 20, CDX 2, and PSA.  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 COPD 4. Hypertension  Disposition: Ricky Conway appears stable.  He has completed 2 cycles of carboplatin/Alimta/pembrolizumab.  He continues to tolerate treatment well.  Plan to proceed with cycle 3 today as scheduled.  He will undergo restaging CTs after completing 4 cycles.  We reviewed the CBC from today.  Counts adequate to proceed with treatment.  He will return for lab, follow-up, cycle 4 carboplatin/Alimta/pembrolizumab in 3 weeks.  He will contact the office in the interim with any problems.    Ned Card ANP/GNP-BC   11/20/2018  10:07 AM

## 2018-11-21 ENCOUNTER — Telehealth: Payer: Self-pay | Admitting: Nurse Practitioner

## 2018-11-21 NOTE — Telephone Encounter (Signed)
Scheduled appt per 5/28 los. °

## 2018-11-28 ENCOUNTER — Encounter: Payer: Self-pay | Admitting: *Deleted

## 2018-11-28 NOTE — Progress Notes (Signed)
Disability paperwork for [employer] sent to Dr. Raul Del office in error. Office faxed forms to [facility] and these were placed in inbasket for disability specialist.

## 2018-12-07 ENCOUNTER — Other Ambulatory Visit: Payer: Self-pay | Admitting: Oncology

## 2018-12-11 ENCOUNTER — Other Ambulatory Visit: Payer: Self-pay

## 2018-12-11 ENCOUNTER — Inpatient Hospital Stay (HOSPITAL_BASED_OUTPATIENT_CLINIC_OR_DEPARTMENT_OTHER): Payer: 59 | Admitting: Nurse Practitioner

## 2018-12-11 ENCOUNTER — Encounter: Payer: Self-pay | Admitting: Nurse Practitioner

## 2018-12-11 ENCOUNTER — Inpatient Hospital Stay: Payer: 59

## 2018-12-11 ENCOUNTER — Inpatient Hospital Stay: Payer: 59 | Attending: Oncology

## 2018-12-11 ENCOUNTER — Other Ambulatory Visit: Payer: Self-pay | Admitting: *Deleted

## 2018-12-11 VITALS — BP 108/57 | HR 78 | Temp 98.3°F | Resp 18 | Ht 70.0 in | Wt 169.0 lb

## 2018-12-11 DIAGNOSIS — R918 Other nonspecific abnormal finding of lung field: Secondary | ICD-10-CM | POA: Insufficient documentation

## 2018-12-11 DIAGNOSIS — I10 Essential (primary) hypertension: Secondary | ICD-10-CM

## 2018-12-11 DIAGNOSIS — C787 Secondary malignant neoplasm of liver and intrahepatic bile duct: Secondary | ICD-10-CM | POA: Insufficient documentation

## 2018-12-11 DIAGNOSIS — J449 Chronic obstructive pulmonary disease, unspecified: Secondary | ICD-10-CM | POA: Diagnosis not present

## 2018-12-11 DIAGNOSIS — C801 Malignant (primary) neoplasm, unspecified: Secondary | ICD-10-CM | POA: Diagnosis not present

## 2018-12-11 DIAGNOSIS — Z5111 Encounter for antineoplastic chemotherapy: Secondary | ICD-10-CM | POA: Diagnosis present

## 2018-12-11 DIAGNOSIS — C3490 Malignant neoplasm of unspecified part of unspecified bronchus or lung: Secondary | ICD-10-CM

## 2018-12-11 DIAGNOSIS — K8689 Other specified diseases of pancreas: Secondary | ICD-10-CM | POA: Diagnosis not present

## 2018-12-11 DIAGNOSIS — Z5112 Encounter for antineoplastic immunotherapy: Secondary | ICD-10-CM | POA: Insufficient documentation

## 2018-12-11 LAB — CBC WITH DIFFERENTIAL (CANCER CENTER ONLY)
Abs Immature Granulocytes: 0.04 10*3/uL (ref 0.00–0.07)
Basophils Absolute: 0 10*3/uL (ref 0.0–0.1)
Basophils Relative: 0 %
Eosinophils Absolute: 0.1 10*3/uL (ref 0.0–0.5)
Eosinophils Relative: 2 %
HCT: 43.1 % (ref 39.0–52.0)
Hemoglobin: 14.9 g/dL (ref 13.0–17.0)
Immature Granulocytes: 1 %
Lymphocytes Relative: 16 %
Lymphs Abs: 1.1 10*3/uL (ref 0.7–4.0)
MCH: 33.6 pg (ref 26.0–34.0)
MCHC: 34.6 g/dL (ref 30.0–36.0)
MCV: 97.3 fL (ref 80.0–100.0)
Monocytes Absolute: 1.2 10*3/uL — ABNORMAL HIGH (ref 0.1–1.0)
Monocytes Relative: 17 %
Neutro Abs: 4.7 10*3/uL (ref 1.7–7.7)
Neutrophils Relative %: 64 %
Platelet Count: 335 10*3/uL (ref 150–400)
RBC: 4.43 MIL/uL (ref 4.22–5.81)
RDW: 17.9 % — ABNORMAL HIGH (ref 11.5–15.5)
WBC Count: 7.3 10*3/uL (ref 4.0–10.5)
nRBC: 0 % (ref 0.0–0.2)

## 2018-12-11 LAB — CMP (CANCER CENTER ONLY)
ALT: 37 U/L (ref 0–44)
AST: 31 U/L (ref 15–41)
Albumin: 3.8 g/dL (ref 3.5–5.0)
Alkaline Phosphatase: 107 U/L (ref 38–126)
Anion gap: 10 (ref 5–15)
BUN: 11 mg/dL (ref 8–23)
CO2: 27 mmol/L (ref 22–32)
Calcium: 9 mg/dL (ref 8.9–10.3)
Chloride: 98 mmol/L (ref 98–111)
Creatinine: 0.67 mg/dL (ref 0.61–1.24)
GFR, Est AFR Am: >60 mL/min (ref >60)
GFR, Estimated: >60 mL/min (ref >60)
Glucose, Bld: 97 mg/dL (ref 70–99)
Potassium: 4 mmol/L (ref 3.5–5.1)
Sodium: 135 mmol/L (ref 135–145)
Total Bilirubin: 0.5 mg/dL (ref 0.3–1.2)
Total Protein: 7 g/dL (ref 6.5–8.1)

## 2018-12-11 MED ORDER — PALONOSETRON HCL INJECTION 0.25 MG/5ML
INTRAVENOUS | Status: AC
  Filled 2018-12-11: qty 5

## 2018-12-11 MED ORDER — SODIUM CHLORIDE 0.9% FLUSH
10.0000 mL | INTRAVENOUS | Status: AC | PRN
  Filled 2018-12-11: qty 10

## 2018-12-11 MED ORDER — SODIUM CHLORIDE 0.9 % IV SOLN
520.0000 mg/m2 | Freq: Once | INTRAVENOUS | Status: AC
  Administered 2018-12-11: 1000 mg via INTRAVENOUS
  Filled 2018-12-11: qty 40

## 2018-12-11 MED ORDER — SODIUM CHLORIDE 0.9 % IV SOLN
Freq: Once | INTRAVENOUS | Status: AC
  Administered 2018-12-11: 12:00:00 via INTRAVENOUS
  Filled 2018-12-11: qty 5

## 2018-12-11 MED ORDER — SODIUM CHLORIDE 0.9 % IV SOLN
Freq: Once | INTRAVENOUS | Status: AC
  Administered 2018-12-11: 12:00:00 via INTRAVENOUS
  Filled 2018-12-11: qty 250

## 2018-12-11 MED ORDER — SODIUM CHLORIDE 0.9 % IV SOLN
200.0000 mg | Freq: Once | INTRAVENOUS | Status: AC
  Administered 2018-12-11: 200 mg via INTRAVENOUS
  Filled 2018-12-11: qty 8

## 2018-12-11 MED ORDER — PALONOSETRON HCL INJECTION 0.25 MG/5ML
0.2500 mg | Freq: Once | INTRAVENOUS | Status: AC
  Administered 2018-12-11: 0.25 mg via INTRAVENOUS

## 2018-12-11 MED ORDER — SODIUM CHLORIDE 0.9 % IV SOLN
464.5000 mg | Freq: Once | INTRAVENOUS | Status: AC
  Administered 2018-12-11: 460 mg via INTRAVENOUS
  Filled 2018-12-11: qty 46

## 2018-12-11 NOTE — Patient Instructions (Signed)
[facility] Discharge Instructions for Patients Receiving Chemotherapy  Today you received the following chemotherapy agents Keytruda, Alimta and Carboplatin. To help prevent nausea and vomiting after your treatment, we encourage you to take your nausea medication as directed BUT NO ZOFRAN FOR 3 DAYS POST CHEMO. If you develop nausea and vomiting that is not controlled by your nausea medication, call the clinic.   BELOW ARE SYMPTOMS THAT SHOULD BE REPORTED IMMEDIATELY:  *FEVER GREATER THAN 100.5 F  *CHILLS WITH OR WITHOUT FEVER  NAUSEA AND VOMITING THAT IS NOT CONTROLLED WITH YOUR NAUSEA MEDICATION  *UNUSUAL SHORTNESS OF BREATH  *UNUSUAL BRUISING OR BLEEDING  TENDERNESS IN MOUTH AND THROAT WITH OR WITHOUT PRESENCE OF ULCERS  *URINARY PROBLEMS  *BOWEL PROBLEMS  UNUSUAL RASH Items with * indicate a potential emergency and should be followed up as soon as possible.  Feel free to call the clinic you have any questions or concerns. The clinic phone number is [phone].  Please show the [facility] at check-in to the Emergency Department and triage nurse.

## 2018-12-11 NOTE — Progress Notes (Signed)
[facility] OFFICE PROGRESS NOTE   Diagnosis: Metastatic adenocarcinoma  INTERVAL HISTORY:   Ricky Conway returns as scheduled.  He completed cycle 3 carboplatin/Alimta/pembrolizumab 11/20/2018.  He denies nausea/vomiting.  No mouth sores.  No diarrhea.  Some constipation.  He feels a little "wobbly" at times.  No falls.  This is not new.  He denies lightheadedness, dizziness.  He reports watery eyes and a runny nose since beginning chemotherapy.  No visual disturbance.  No change in baseline dyspnea.  No fever.  No calf pain.  Objective:  Vital signs in last 24 hours:  Blood pressure (!) 108/57, pulse 78, temperature 98.3 F (36.8 C), temperature source Oral, resp. rate 18, height 5\' 10"  (1.778 m), weight 169 lb (76.7 kg), SpO2 (!) 89 %.    HEENT: Bilateral conjunctival erythema.  No thrush or ulcers. GI: Abdomen soft and nontender.  No hepatomegaly. Vascular: Trace edema right lower leg.  Calves soft and nontender. Skin: No rash.   Lab Results:  Lab Results  Component Value Date   WBC 7.3 12/11/2018   HGB 14.9 12/11/2018   HCT 43.1 12/11/2018   MCV 97.3 12/11/2018   PLT 335 12/11/2018   NEUTROABS 4.7 12/11/2018    Imaging:  No results found.  Medications: I have reviewed the patient's current medications.  Assessment/Plan: 1. Pancreas mass, liver lesions  Abdominal ultrasound 09/03/2018-3 liver lesions. Intra-and extrahepatic biliary ductal dilatation.  Presentation to the emergency department 09/08/2018 with painless jaundice. Initial bilirubin returned at 18.3.   MRI abdomen 09/08/2018-1.9 x 2.6 cm hypoenhancing lesion arising exophytically along the posterior aspect of the pancreatic head suspicious for pancreatic adenocarcinoma. There was suspected involvement of the distal common duct with secondary intrahepatic/extrahepatic ductal dilatation. Lesion noted to abut the IVC. Multiple liver metastases noted. Small upper abdominal/retroperitoneal  lymph nodes noted.   ERCP 09/09/2018-high-grade stricture and obstruction of the distal common bile duct. A metallic biliary stent was placed. Brushings in the mid and lower third of the main bile duct were obtained with no malignant cells identified.   Bilirubin improved at 4.7 on 09/10/2018.   Biopsy of a liver lesion on 09/11/2018-metastatic carcinoma, cytokeratin 7 and TTF-1 positive, negative for cytokeratin 20, CDX 2, and PSA.  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 COPD 4. Hypertension 5. Trace edema right leg 12/11/2018.  Referred for Doppler. 6. Watery eyes, runny nose reported 12/11/2018.  Question related to Alimta.   Disposition: Ricky Conway appears stable.  He has completed 3 cycles of carboplatin/Alimta/pembrolizumab.  Plan to proceed with cycle 4 today as scheduled pending chemistry panel results.  He will undergo restaging CTs in 2 weeks.  We reviewed the CBC from today.  Blood counts are adequate for treatment.  The watery eyes and runny nose may be related to Alimta.  We will continue to monitor.  He has edema of the right lower leg.  We are referring him for a venous Doppler.  He will return for lab and follow-up in 3 weeks.  He will contact the office in the interim with any problems.  Patient seen with Dr. Benay Spice.  25 minutes were spent face-to-face at today's visit with the majority of that time involved in counseling/coordination of care.    Ned Card ANP/GNP-BC   12/11/2018  10:23 AM This was a  shared visit with Ned Card.  Ricky Conway was interviewed and examined.  He appears to have conjunctivitis,  potentially related to Alimta.  We will make an ophthalmology referral if this worsens.  He will be referred for a Doppler of the right leg to rule out a deep vein thrombosis.  He will undergo restaging CTs after this cycle of systemic therapy.  Julieanne Manson, MD

## 2018-12-12 ENCOUNTER — Ambulatory Visit (HOSPITAL_COMMUNITY)
Admission: RE | Admit: 2018-12-12 | Discharge: 2018-12-12 | Disposition: A | Discharge status: Home / Self Care | Payer: 59 | Source: Ambulatory Visit | Attending: Nurse Practitioner | Admitting: Nurse Practitioner

## 2018-12-12 DIAGNOSIS — C3491 Malignant neoplasm of unspecified part of right bronchus or lung: Secondary | ICD-10-CM

## 2018-12-12 DIAGNOSIS — C3490 Malignant neoplasm of unspecified part of unspecified bronchus or lung: Secondary | ICD-10-CM | POA: Insufficient documentation

## 2018-12-12 NOTE — Progress Notes (Signed)
RLE venous duplex       has been completed. Preliminary results can be found under CV proc through chart review. June Leap, BS, RDMS, RVT   Attempted call report 9:06 AM to Ned Card with no answer.

## 2018-12-24 ENCOUNTER — Other Ambulatory Visit: Payer: Self-pay

## 2018-12-24 ENCOUNTER — Observation Stay (HOSPITAL_COMMUNITY)
Admission: EM | Admit: 2018-12-24 | Discharge: 2018-12-26 | Disposition: A | Discharge status: Home / Self Care | Payer: 59 | Attending: Internal Medicine | Admitting: Internal Medicine

## 2018-12-24 ENCOUNTER — Emergency Department (HOSPITAL_COMMUNITY): Payer: 59

## 2018-12-24 ENCOUNTER — Encounter (HOSPITAL_COMMUNITY): Payer: Self-pay

## 2018-12-24 ENCOUNTER — Telehealth: Payer: Self-pay | Admitting: *Deleted

## 2018-12-24 DIAGNOSIS — J449 Chronic obstructive pulmonary disease, unspecified: Secondary | ICD-10-CM | POA: Diagnosis present

## 2018-12-24 DIAGNOSIS — E871 Hypo-osmolality and hyponatremia: Secondary | ICD-10-CM | POA: Diagnosis not present

## 2018-12-24 DIAGNOSIS — J9621 Acute and chronic respiratory failure with hypoxia: Principal | ICD-10-CM | POA: Insufficient documentation

## 2018-12-24 DIAGNOSIS — C787 Secondary malignant neoplasm of liver and intrahepatic bile duct: Secondary | ICD-10-CM | POA: Insufficient documentation

## 2018-12-24 DIAGNOSIS — S2232XG Fracture of one rib, left side, subsequent encounter for fracture with delayed healing: Secondary | ICD-10-CM | POA: Diagnosis not present

## 2018-12-24 DIAGNOSIS — S2231XD Fracture of one rib, right side, subsequent encounter for fracture with routine healing: Secondary | ICD-10-CM | POA: Insufficient documentation

## 2018-12-24 DIAGNOSIS — E876 Hypokalemia: Secondary | ICD-10-CM | POA: Insufficient documentation

## 2018-12-24 DIAGNOSIS — D696 Thrombocytopenia, unspecified: Secondary | ICD-10-CM | POA: Diagnosis not present

## 2018-12-24 DIAGNOSIS — R509 Fever, unspecified: Secondary | ICD-10-CM | POA: Diagnosis present

## 2018-12-24 DIAGNOSIS — R6510 Systemic inflammatory response syndrome (SIRS) of non-infectious origin without acute organ dysfunction: Secondary | ICD-10-CM | POA: Diagnosis not present

## 2018-12-24 DIAGNOSIS — K831 Obstruction of bile duct: Secondary | ICD-10-CM | POA: Diagnosis present

## 2018-12-24 DIAGNOSIS — I10 Essential (primary) hypertension: Secondary | ICD-10-CM | POA: Diagnosis not present

## 2018-12-24 DIAGNOSIS — I700 Atherosclerosis of aorta: Secondary | ICD-10-CM | POA: Diagnosis not present

## 2018-12-24 DIAGNOSIS — D649 Anemia, unspecified: Secondary | ICD-10-CM | POA: Diagnosis not present

## 2018-12-24 DIAGNOSIS — J439 Emphysema, unspecified: Secondary | ICD-10-CM | POA: Diagnosis not present

## 2018-12-24 DIAGNOSIS — I4510 Unspecified right bundle-branch block: Secondary | ICD-10-CM | POA: Insufficient documentation

## 2018-12-24 DIAGNOSIS — I2510 Atherosclerotic heart disease of native coronary artery without angina pectoris: Secondary | ICD-10-CM | POA: Insufficient documentation

## 2018-12-24 DIAGNOSIS — Z7982 Long term (current) use of aspirin: Secondary | ICD-10-CM | POA: Insufficient documentation

## 2018-12-24 DIAGNOSIS — Q2546 Tortuous aortic arch: Secondary | ICD-10-CM | POA: Diagnosis not present

## 2018-12-24 DIAGNOSIS — Z1159 Encounter for screening for other viral diseases: Secondary | ICD-10-CM | POA: Diagnosis not present

## 2018-12-24 DIAGNOSIS — F17210 Nicotine dependence, cigarettes, uncomplicated: Secondary | ICD-10-CM | POA: Diagnosis not present

## 2018-12-24 DIAGNOSIS — C3490 Malignant neoplasm of unspecified part of unspecified bronchus or lung: Secondary | ICD-10-CM | POA: Diagnosis not present

## 2018-12-24 DIAGNOSIS — E860 Dehydration: Secondary | ICD-10-CM | POA: Insufficient documentation

## 2018-12-24 DIAGNOSIS — E785 Hyperlipidemia, unspecified: Secondary | ICD-10-CM | POA: Diagnosis not present

## 2018-12-24 DIAGNOSIS — R0902 Hypoxemia: Secondary | ICD-10-CM

## 2018-12-24 DIAGNOSIS — Z79899 Other long term (current) drug therapy: Secondary | ICD-10-CM | POA: Diagnosis not present

## 2018-12-24 DIAGNOSIS — X58XXXD Exposure to other specified factors, subsequent encounter: Secondary | ICD-10-CM | POA: Diagnosis not present

## 2018-12-24 DIAGNOSIS — J9601 Acute respiratory failure with hypoxia: Secondary | ICD-10-CM | POA: Diagnosis not present

## 2018-12-24 LAB — COMPREHENSIVE METABOLIC PANEL
ALT: 39 U/L (ref 0–44)
AST: 32 U/L (ref 15–41)
Albumin: 3.8 g/dL (ref 3.5–5.0)
Alkaline Phosphatase: 94 U/L (ref 38–126)
Anion gap: 11 (ref 5–15)
BUN: 15 mg/dL (ref 8–23)
CO2: 25 mmol/L (ref 22–32)
Calcium: 8.3 mg/dL — ABNORMAL LOW (ref 8.9–10.3)
Chloride: 92 mmol/L — ABNORMAL LOW (ref 98–111)
Creatinine, Ser: 0.64 mg/dL (ref 0.61–1.24)
GFR calc Af Amer: >60 mL/min (ref >60)
GFR calc non Af Amer: >60 mL/min (ref >60)
Glucose, Bld: 135 mg/dL — ABNORMAL HIGH (ref 70–99)
Potassium: 3.5 mmol/L (ref 3.5–5.1)
Sodium: 128 mmol/L — ABNORMAL LOW (ref 135–145)
Total Bilirubin: 0.6 mg/dL (ref 0.3–1.2)
Total Protein: 6.9 g/dL (ref 6.5–8.1)

## 2018-12-24 LAB — CBC WITH DIFFERENTIAL/PLATELET
Abs Immature Granulocytes: 0.03 10*3/uL (ref 0.00–0.07)
Basophils Absolute: 0 10*3/uL (ref 0.0–0.1)
Basophils Relative: 0 %
Eosinophils Absolute: 0 10*3/uL (ref 0.0–0.5)
Eosinophils Relative: 0 %
HCT: 40.4 % (ref 39.0–52.0)
Hemoglobin: 14.2 g/dL (ref 13.0–17.0)
Immature Granulocytes: 0 %
Lymphocytes Relative: 5 %
Lymphs Abs: 0.4 10*3/uL — ABNORMAL LOW (ref 0.7–4.0)
MCH: 35.1 pg — ABNORMAL HIGH (ref 26.0–34.0)
MCHC: 35.1 g/dL (ref 30.0–36.0)
MCV: 100 fL (ref 80.0–100.0)
Monocytes Absolute: 1.1 10*3/uL — ABNORMAL HIGH (ref 0.1–1.0)
Monocytes Relative: 15 %
Neutro Abs: 5.9 10*3/uL (ref 1.7–7.7)
Neutrophils Relative %: 80 %
Platelets: 131 10*3/uL — ABNORMAL LOW (ref 150–400)
RBC: 4.04 MIL/uL — ABNORMAL LOW (ref 4.22–5.81)
RDW: 18.3 % — ABNORMAL HIGH (ref 11.5–15.5)
WBC: 7.5 10*3/uL (ref 4.0–10.5)
nRBC: 0 % (ref 0.0–0.2)

## 2018-12-24 LAB — URINALYSIS, ROUTINE W REFLEX MICROSCOPIC
Bilirubin Urine: NEGATIVE
Glucose, UA: NEGATIVE mg/dL
Hgb urine dipstick: NEGATIVE
Ketones, ur: 5 mg/dL — AB
Leukocytes,Ua: NEGATIVE
Nitrite: NEGATIVE
Protein, ur: NEGATIVE mg/dL
Specific Gravity, Urine: 1.021 (ref 1.005–1.030)
pH: 5 (ref 5.0–8.0)

## 2018-12-24 LAB — LACTIC ACID, PLASMA
Lactic Acid, Venous: 1 mmol/L (ref 0.5–1.9)
Lactic Acid, Venous: 0.9 mmol/L (ref 0.5–1.9)

## 2018-12-24 LAB — TROPONIN I (HIGH SENSITIVITY)
Troponin I (High Sensitivity): 11 ng/L (ref <18)
Troponin I (High Sensitivity): 11 ng/L (ref <18)

## 2018-12-24 LAB — PROTIME-INR
INR: 1.1 (ref 0.8–1.2)
Prothrombin Time: 14.2 seconds (ref 11.4–15.2)

## 2018-12-24 LAB — SARS CORONAVIRUS 2 BY RT PCR (HOSPITAL ORDER, PERFORMED IN ~~LOC~~ HOSPITAL LAB): SARS Coronavirus 2: NEGATIVE

## 2018-12-24 MED ORDER — SODIUM CHLORIDE 0.9 % IV SOLN
2.0000 g | Freq: Once | INTRAVENOUS | Status: AC
  Administered 2018-12-25: 2 g via INTRAVENOUS
  Filled 2018-12-24 (×6): qty 2

## 2018-12-24 MED ORDER — SODIUM CHLORIDE 0.9% FLUSH
3.0000 mL | Freq: Once | INTRAVENOUS | Status: AC
  Administered 2018-12-24: 3 mL via INTRAVENOUS

## 2018-12-24 MED ORDER — SIMVASTATIN 40 MG PO TABS
40.0000 mg | ORAL_TABLET | Freq: Every day | ORAL | Status: DC
  Administered 2018-12-25: 40 mg via ORAL
  Filled 2018-12-24: qty 1

## 2018-12-24 MED ORDER — ACETAMINOPHEN 325 MG PO TABS
650.0000 mg | ORAL_TABLET | Freq: Four times a day (QID) | ORAL | Status: DC | PRN
  Administered 2018-12-25: 650 mg via ORAL
  Filled 2018-12-24: qty 2

## 2018-12-24 MED ORDER — ONDANSETRON HCL 4 MG/2ML IJ SOLN: 4.0000 mg | Freq: Four times a day (QID) | INTRAMUSCULAR | Status: DC | PRN

## 2018-12-24 MED ORDER — FOLIC ACID 1 MG PO TABS
1.0000 mg | ORAL_TABLET | Freq: Every day | ORAL | Status: DC
  Administered 2018-12-25 – 2018-12-26 (×2): 1 mg via ORAL
  Filled 2018-12-24 (×2): qty 1

## 2018-12-24 MED ORDER — SODIUM CHLORIDE 0.9 % IV BOLUS
500.0000 mL | Freq: Once | INTRAVENOUS | Status: AC
  Administered 2018-12-24: 500 mL via INTRAVENOUS

## 2018-12-24 MED ORDER — VANCOMYCIN HCL IN DEXTROSE 1-5 GM/200ML-% IV SOLN
1000.0000 mg | Freq: Once | INTRAVENOUS | Status: DC
  Filled 2018-12-24: qty 200

## 2018-12-24 MED ORDER — ONDANSETRON HCL 4 MG PO TABS: 4.0000 mg | ORAL_TABLET | Freq: Four times a day (QID) | ORAL | Status: DC | PRN

## 2018-12-24 MED ORDER — ENOXAPARIN SODIUM 40 MG/0.4ML ~~LOC~~ SOLN
40.0000 mg | Freq: Every day | SUBCUTANEOUS | Status: DC
  Administered 2018-12-25 (×2): 40 mg via SUBCUTANEOUS
  Filled 2018-12-24 (×2): qty 0.4

## 2018-12-24 MED ORDER — LACTATED RINGERS IV SOLN
INTRAVENOUS | Status: AC
  Administered 2018-12-25 (×2): via INTRAVENOUS

## 2018-12-24 MED ORDER — VANCOMYCIN HCL 10 G IV SOLR
1500.0000 mg | Freq: Once | INTRAVENOUS | Status: AC
  Administered 2018-12-24: 1500 mg via INTRAVENOUS
  Filled 2018-12-24: qty 1500

## 2018-12-24 MED ORDER — ACETAMINOPHEN 650 MG RE SUPP: 650.0000 mg | Freq: Four times a day (QID) | RECTAL | Status: DC | PRN

## 2018-12-24 MED ORDER — VITAMIN B-12 100 MCG PO TABS
100.0000 ug | ORAL_TABLET | Freq: Every day | ORAL | Status: DC
  Administered 2018-12-25 – 2018-12-26 (×2): 100 ug via ORAL
  Filled 2018-12-24 (×2): qty 1

## 2018-12-24 MED ORDER — ASPIRIN EC 81 MG PO TBEC
81.0000 mg | DELAYED_RELEASE_TABLET | Freq: Every day | ORAL | Status: DC
  Administered 2018-12-25 – 2018-12-26 (×2): 81 mg via ORAL
  Filled 2018-12-24 (×2): qty 1

## 2018-12-24 MED ORDER — LACTATED RINGERS IV BOLUS
1000.0000 mL | Freq: Once | INTRAVENOUS | Status: AC
  Administered 2018-12-24: 1000 mL via INTRAVENOUS

## 2018-12-24 MED ORDER — ACETAMINOPHEN 500 MG PO TABS
1000.0000 mg | ORAL_TABLET | Freq: Once | ORAL | Status: AC
  Administered 2018-12-24: 1000 mg via ORAL
  Filled 2018-12-24: qty 2

## 2018-12-24 MED ORDER — METRONIDAZOLE IN NACL 5-0.79 MG/ML-% IV SOLN
500.0000 mg | Freq: Once | INTRAVENOUS | Status: AC
  Administered 2018-12-24: 500 mg via INTRAVENOUS
  Filled 2018-12-24: qty 100

## 2018-12-24 NOTE — ED Notes (Addendum)
Attempted to call report. Phone rolled to nurses station 3 times. Unable to get in touch with nurse receiving report.

## 2018-12-24 NOTE — ED Notes (Addendum)
Called Ricky Conway, confirmed LR started, BP 101/52, and let her know pt transporting upstairs now.

## 2018-12-24 NOTE — H&P (Signed)
History and Physical    MONTERRIUS CARDOSA TDV:[id number] DOB: Jun 01, 1946 DOA: 12/24/2018  PCP: Mayra Neer, MD  Patient coming from: Home.  Chief Complaint: Fever chills.  HPI: Ricky Conway is a 73 y.o. male with history of COPD, hypertension, hyperlipidemia who was recently diagnosed with metastatic carcinoma on chemotherapy and had a biliary stent placed in the early part of this year in March 2020 presents to the ER with complaints of having fever chills last 2 days.  Denies any nausea vomiting abdominal pain diarrhea chest pain or shortness of breath.  ED Course: In the ER patient was hypotensive requiring fluid bolus and was febrile with temperature 103 F.  Chest x-ray UA unremarkable.  COVID-19 test was negative.  A repeat COVID-19 test send out was sent.  Blood cultures were obtained started on empiric antibiotics and admitted.  Patient also was found to be hypoxic requiring 4 L oxygen in the ER.  On my exam patient is not in distress.  Review of Systems: As per HPI, rest all negative.   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/08/2018   COPD (chronic obstructive pulmonary disease) ([place])    Hyperlipidemia    Hypertension     Past Surgical History:  Procedure Laterality Date   BILIARY BRUSHING  09/09/2018   Procedure: BILIARY BRUSHING;  Surgeon: Ronnette Juniper, MD;  Location: [facility] ENDOSCOPY;  Service: Gastroenterology;;   BILIARY STENT PLACEMENT  09/09/2018   Procedure: BILIARY STENT PLACEMENT;  Surgeon: Ronnette Juniper, MD;  Location: [place];  Service: Gastroenterology;;   ERCP N/A 09/09/2018   Procedure: ENDOSCOPIC RETROGRADE CHOLANGIOPANCREATOGRAPHY (ERCP);  Surgeon: Ronnette Juniper, MD;  Location: [place];  Service: Gastroenterology;  Laterality: N/A;   FOOT SURGERY     HERNIA REPAIR     WITH MESH   REMOVAL OF STONES  09/09/2018   Procedure: REMOVAL OF STONES;  Surgeon: Ronnette Juniper, MD;  Location: [facility] ENDOSCOPY;  Service:  Gastroenterology;;   Joan Mayans  09/09/2018   Procedure: Joan Mayans;  Surgeon: Ronnette Juniper, MD;  Location: [place];  Service: Gastroenterology;;     reports that he has been smoking cigarettes. He has a 60.00 pack-year smoking history. He has never used smokeless tobacco. He reports current alcohol use. He reports that he does not use drugs.  No Known Allergies  Family History  Problem Relation Age of Onset   Diabetes Neg Hx    CAD Neg Hx    Cancer Neg Hx     Prior to Admission medications   Medication Sig Start Date End Date Taking? Authorizing Provider  amLODipine (NORVASC) 10 MG tablet Take 10 mg by mouth daily. 07/03/18  Yes [provider]  aspirin EC 81 MG tablet Take 81 mg by mouth daily.   Yes [provider]  folic acid (FOLVITE) 1 MG tablet Take 1 tablet (1 mg total) by mouth daily. 10/03/18  Yes Owens Shark, NP  furosemide (LASIX) 20 MG tablet Take 20 mg by mouth daily. 06/29/18  Yes [provider]  lisinopril (PRINIVIL,ZESTRIL) 40 MG tablet Take 40 mg by mouth daily. 06/24/18  Yes [provider]  Multiple Vitamins-Minerals (PRESERVISION AREDS PO) Take 1 tablet by mouth 2 (two) times a day.   Yes [provider]  prochlorperazine (COMPAZINE) 5 MG tablet Take 1 tablet (5 mg total) by mouth every 6 (six) hours as needed for nausea or vomiting. 10/03/18  Yes Owens Shark, NP  Saw Palmetto, Serenoa repens, (  SAW PALMETTO PO) Take 1 capsule by mouth 2 (two) times a day.   Yes [provider]  simvastatin (ZOCOR) 40 MG tablet Take 40 mg by mouth daily at 6 PM. 06/08/18  Yes [provider]  vitamin B-12 (CYANOCOBALAMIN) 100 MCG tablet Take 100 mcg by mouth daily.   Yes [provider]    Physical Exam:   Constitutional: Moderately built and nourished. Vitals:   12/24/18 1900 12/24/18 1930 12/24/18 1954 12/24/18 1955  BP: (!) 106/58 (!) 105/59 (!) 99/54   Pulse: 75   74  Resp: (!) 22 (!)  21 (!) 21 14  Temp:  98.7 F (37.1 C)    TempSrc:  Oral    SpO2: 91%     Weight:      Height:       Eyes: Anicteric no pallor. ENMT: No discharge from the ears eyes nose or mouth. Neck: No mass felt.  No neck rigidity. Respiratory: No rhonchi or crepitations. Cardiovascular: S1-S2 heard. Abdomen: Soft nontender bowel sounds present. Musculoskeletal: No edema.  No joint effusion. Skin: No rash. Neurologic: Alert awake oriented to time place and person.  Moves all extremities. Psychiatric: Appears normal per normal affect.   Labs on Admission: I have personally reviewed following labs and imaging studies  CBC: Recent Labs  Lab 12/24/18 1720  WBC 7.5  NEUTROABS 5.9  HGB 14.2  HCT 40.4  MCV 100.0  PLT 094*   Basic Metabolic Panel: Recent Labs  Lab 12/24/18 1720  NA 128*  K 3.5  CL 92*  CO2 25  GLUCOSE 135*  BUN 15  CREATININE 0.64  CALCIUM 8.3*   GFR: Estimated Creatinine Clearance: 85 mL/min (by C-G formula based on SCr of 0.64 mg/dL). Liver Function Tests: Recent Labs  Lab 12/24/18 1720  AST 32  ALT 39  ALKPHOS 94  BILITOT 0.6  PROT 6.9  ALBUMIN 3.8   No results for input(s): LIPASE, AMYLASE in the last 168 hours. No results for input(s): AMMONIA in the last 168 hours. Coagulation Profile: Recent Labs  Lab 12/24/18 1720  INR 1.1   Cardiac Enzymes: No results for input(s): CKTOTAL, CKMB, CKMBINDEX, TROPONINI in the last 168 hours. BNP (last 3 results) No results for input(s): PROBNP in the last 8760 hours. HbA1C: No results for input(s): HGBA1C in the last 72 hours. CBG: No results for input(s): GLUCAP in the last 168 hours. Lipid Profile: No results for input(s): CHOL, HDL, LDLCALC, TRIG, CHOLHDL, LDLDIRECT in the last 72 hours. Thyroid Function Tests: No results for input(s): TSH, T4TOTAL, FREET4, T3FREE, THYROIDAB in the last 72 hours. Anemia Panel: No results for input(s): VITAMINB12, FOLATE, FERRITIN, TIBC, IRON, RETICCTPCT in the last  72 hours. Urine analysis: No results found for: COLORURINE, APPEARANCEUR, LABSPEC, PHURINE, GLUCOSEU, HGBUR, BILIRUBINUR, KETONESUR, PROTEINUR, UROBILINOGEN, NITRITE, LEUKOCYTESUR Sepsis Labs: @LABRCNTIP (procalcitonin:4,lacticidven:4) ) Recent Results (from the past 240 hour(s))  SARS Coronavirus 2 (CEPHEID- Performed in [place] hospital lab), Hosp Order     Status: None   Collection Time: 12/24/18  5:26 PM   Specimen: Nasopharyngeal Swab  Result Value Ref Range Status   SARS Coronavirus 2 NEGATIVE NEGATIVE Final    Comment: (NOTE) If result is NEGATIVE SARS-CoV-2 target nucleic acids are NOT DETECTED. The SARS-CoV-2 RNA is generally detectable in upper and lower  respiratory specimens during the acute phase of infection. The lowest  concentration of SARS-CoV-2 viral copies this assay can detect is 250  copies / mL. A negative result does not preclude SARS-CoV-2  infection  and should not be used as the sole basis for treatment or other  patient management decisions.  A negative result may occur with  improper specimen collection / handling, submission of specimen other  than nasopharyngeal swab, presence of viral mutation(s) within the  areas targeted by this assay, and inadequate number of viral copies  (<250 copies / mL). A negative result must be combined with clinical  observations, patient history, and epidemiological information. If result is POSITIVE SARS-CoV-2 target nucleic acids are DETECTED. The SARS-CoV-2 RNA is generally detectable in upper and lower  respiratory specimens dur ing the acute phase of infection.  Positive  results are indicative of active infection with SARS-CoV-2.  Clinical  correlation with patient history and other diagnostic information is  necessary to determine patient infection status.  Positive results do  not rule out bacterial infection or co-infection with other viruses. If result is PRESUMPTIVE POSTIVE SARS-CoV-2 nucleic acids MAY BE  PRESENT.   A presumptive positive result was obtained on the submitted specimen  and confirmed on repeat testing.  While 2019 novel coronavirus  (SARS-CoV-2) nucleic acids may be present in the submitted sample  additional confirmatory testing may be necessary for epidemiological  and / or clinical management purposes  to differentiate between  SARS-CoV-2 and other Sarbecovirus currently known to infect humans.  If clinically indicated additional testing with an alternate test  methodology [phone]) is advised. The SARS-CoV-2 RNA is generally  detectable in upper and lower respiratory sp ecimens during the acute  phase of infection. The expected result is Negative. Fact Sheet for Patients:  StrictlyIdeas.no Fact Sheet for Healthcare Providers: BankingDealers.co.za This test is not yet approved or cleared by the [place] FDA and has been authorized for detection and/or diagnosis of SARS-CoV-2 by FDA under an Emergency Use Authorization (EUA).  This EUA will remain in effect (meaning this test can be used) for the duration of the COVID-19 declaration under Section 564(b)(1) of the Act, 21 U.S.C. section 360bbb-3(b)(1), unless the authorization is terminated or revoked sooner. Performed at [facility], [place] [address] [place], [place] [address]      Radiological Exams on Admission: Dg Chest 2 View  Result Date: 12/24/2018 CLINICAL DATA:  73 year old male being treated for pancreatic cancer. Weakness. EXAM: CHEST - 2 VIEW COMPARISON:  CT Abdomen and Pelvis 09/22/2018 and earlier. FINDINGS: Semi upright AP and lateral views of the chest. Mildly lower lung volumes. Stable cardiac size and mediastinal contours. Visualized tracheal air column is within normal limits. Stable basilar predominant increased interstitial markings, emphysema noted on the prior CT. Bilateral nipple shadows suspected. No pneumothorax, pleural  effusion or acute pulmonary opacity. Stable visualized osseous structures. IMPRESSION: 1. No acute cardiopulmonary abnormality. 2.  Emphysema (ICD10-J43.9). Electronically Signed   By: Genevie Ann M.D.   On: 12/24/2018 18:10    EKG: Independently reviewed.  Normal sinus rhythm incomplete right bundle branch block.  Assessment/Plan Principal Problem:   Acute respiratory failure with hypoxia (HCC) Active Problems:   Common biliary duct obstruction   Hypertension   Hyperlipidemia   COPD, mild (HCC)   Fever    1. SIRS with acute respiratory failure with hypoxia -source of infection not clear.  Repeat COVID-19 test has been sent.  Follow blood cultures.  Continue empiric antibiotic IV fluids.  On exam patient is not wheezing and not in respiratory distress. 2. Hypertension -holding antihypertensives due to low normal blood pressure and hypotension. 3. Hyperlipidemia on statins. 4. History of  COPD presently not wheezing. 5. Metastatic carcinoma being followed by Dr. Learta Codding oncologist.  Given the complexity of situation including septic picture hypoxia and fever patient will require inpatient status.   DVT prophylaxis: Lovenox. Code Status: Full code. Family Communication: Discussed with patient. Disposition Plan: Home. Consults called: None. Admission status: Inpatient.   Rise Patience MD [facility] Pager [phone].  If 7PM-7AM, please contact night-coverage [email or web address] [facility]  12/24/2018, 8:15 PM

## 2018-12-24 NOTE — ED Notes (Signed)
Attempted to call report. Phone rolled to nurse station. Person who answered said they would give message to nurse receiving pt.

## 2018-12-24 NOTE — ED Notes (Signed)
Talked to [place]. He said to start LR bolus, he will call the floor, and transport pt to floor after starting bolus.

## 2018-12-24 NOTE — ED Notes (Signed)
Started LR bolus and vancomycin. Pt ready for transport to floor.

## 2018-12-24 NOTE — ED Notes (Signed)
Bed: QJ33 Expected date:  Expected time:  Means of arrival:  Comments: Triage 1

## 2018-12-24 NOTE — Progress Notes (Signed)
A consult was received from an ED physician for vancomycin and cefepime per pharmacy dosing.  The patient's profile has been reviewed for ht/wt/allergies/indication/available labs.    A one time order has been placed for cefepime 2 g IV once and vancomycin 1500 mg IV once.    Further antibiotics/pharmacy consults should be ordered by admitting physician if indicated.                       Thank you, Lenis Noon, PharmD 12/24/2018  8:21 PM

## 2018-12-24 NOTE — ED Notes (Addendum)
Giving report to [facility] who will receive pt. Ricky Conway spoke with Camera operator. Ubaldo Glassing told me that they will not accept pt until I speak with the provider regarding the patient's BP of 82/50. Attempting to contact provider.

## 2018-12-24 NOTE — ED Provider Notes (Signed)
Emergency Department Provider Note   I have reviewed the triage vital signs and the nursing notes.   HISTORY  Chief Complaint Fever   HPI Ricky Conway is a 73 y.o. male with PMH of lung cancer with last chemo 2 weeks prior presents to the emergency department for evaluation of fever starting yesterday.  He reports associated fatigue without chest pain.  Noted to be hypoxemic in triage with no prior history of similar.  Patient does feel mild dyspnea especially with exertion.  No sick contacts.  Denies sore throat or headache.  No body aches. No radiation of symptoms or modifying factors.    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/08/2018   COPD (chronic obstructive pulmonary disease) ([place])    Hyperlipidemia    Hypertension     Patient Active Problem List   Diagnosis Date Noted   Acute respiratory failure with hypoxia ([place]) 12/24/2018   Fever 12/24/2018   Lung cancer ([place]) 10/03/2018   Goals of care, counseling/discussion 10/03/2018   Malnutrition of moderate degree 09/10/2018   Liver masses 09/08/2018   Common biliary duct obstruction 09/08/2018   Hypertension 09/08/2018   Hyperlipidemia 09/08/2018   COPD, mild ([place]) 09/08/2018    Past Surgical History:  Procedure Laterality Date   BILIARY BRUSHING  09/09/2018   Procedure: BILIARY BRUSHING;  Surgeon: Ronnette Juniper, MD;  Location: [facility] ENDOSCOPY;  Service: Gastroenterology;;   BILIARY STENT PLACEMENT  09/09/2018   Procedure: BILIARY STENT PLACEMENT;  Surgeon: Ronnette Juniper, MD;  Location: [place];  Service: Gastroenterology;;   ERCP N/A 09/09/2018   Procedure: ENDOSCOPIC RETROGRADE CHOLANGIOPANCREATOGRAPHY (ERCP);  Surgeon: Ronnette Juniper, MD;  Location: [place];  Service: Gastroenterology;  Laterality: N/A;   FOOT SURGERY     HERNIA REPAIR     WITH MESH   REMOVAL OF STONES  09/09/2018   Procedure: REMOVAL OF STONES;  Surgeon: Ronnette Juniper, MD;  Location: [place];  Service: Gastroenterology;;   Joan Mayans  09/09/2018   Procedure: Joan Mayans;  Surgeon: Ronnette Juniper, MD;  Location: [facility] ENDOSCOPY;  Service: Gastroenterology;;    Allergies Patient has no known allergies.  Family History  Problem Relation Age of Onset   Diabetes Neg Hx    CAD Neg Hx    Cancer Neg Hx     Social History Social History   Tobacco Use   Smoking status: Heavy Tobacco Smoker    Packs/day: 1.00    Years: 60.00    Pack years: 60.00    Types: Cigarettes   Smokeless tobacco: Never Used   Tobacco comment: Not currently ready to quit  Substance Use Topics   Alcohol use: Yes    Alcohol/week: 0.0 standard drinks    Comment: OCCASIONAL   Drug use: Never    Review of Systems  Constitutional: Positive fever/chills Eyes: No visual changes. ENT: No sore throat. Cardiovascular: Denies chest pain. Respiratory: Positive shortness of breath. Gastrointestinal: No abdominal pain.  No nausea, no vomiting.  No diarrhea.  No constipation. Genitourinary: Negative for dysuria. Musculoskeletal: Negative for back pain. Skin: Negative for rash. Neurological: Negative for headaches, focal weakness or numbness.  10-point ROS otherwise negative.  ____________________________________________   PHYSICAL EXAM:  VITAL SIGNS: ED Triage Vitals  Enc Vitals Group     BP 12/24/18 1651 119/66     Pulse Rate 12/24/18 1651 (!) 113     Resp 12/24/18 1651 (!) 22     Temp 12/24/18 1651 (!) 103.1 F (39.5 C)  Temp Source 12/24/18 1651 Oral     SpO2 12/24/18 1651 (!) 85 %     Weight 12/24/18 1653 165 lb (74.8 kg)     Height 12/24/18 1653 6' (1.829 m)   Constitutional: Alert and oriented. Well appearing and in no acute distress. Eyes: Conjunctivae are normal.  Head: Atraumatic. Nose: No congestion/rhinnorhea. Mouth/Throat: Mucous membranes are dry.  Neck: No stridor.   Cardiovascular: Tachycardia. Good peripheral circulation. Grossly normal heart  sounds.   Respiratory: Normal respiratory effort.  No retractions. Lungs CTAB. Gastrointestinal: Soft and nontender. No distention.  Musculoskeletal: No lower extremity tenderness nor edema. No gross deformities of extremities. Neurologic:  Normal speech and language. No gross focal neurologic deficits are appreciated.  Skin:  Skin is warm, dry and intact. No rash noted.  ____________________________________________   LABS (all labs ordered are listed, but only abnormal results are displayed)  Labs Reviewed  COMPREHENSIVE METABOLIC PANEL - Abnormal; Notable for the following components:      Result Value   Sodium 128 (*)    Chloride 92 (*)    Glucose, Bld 135 (*)    Calcium 8.3 (*)    All other components within normal limits  CBC WITH DIFFERENTIAL/PLATELET - Abnormal; Notable for the following components:   RBC 4.04 (*)    MCH 35.1 (*)    RDW 18.3 (*)    Platelets 131 (*)    Lymphs Abs 0.4 (*)    Monocytes Absolute 1.1 (*)    All other components within normal limits  URINALYSIS, ROUTINE W REFLEX MICROSCOPIC - Abnormal; Notable for the following components:   APPearance HAZY (*)    Ketones, ur 5 (*)    All other components within normal limits  BASIC METABOLIC PANEL - Abnormal; Notable for the following components:   Sodium 133 (*)    Potassium 3.2 (*)    Glucose, Bld 122 (*)    Creatinine, Ser 0.52 (*)    Calcium 7.9 (*)    All other components within normal limits  HEPATIC FUNCTION PANEL - Abnormal; Notable for the following components:   Total Protein 5.5 (*)    Albumin 3.0 (*)    All other components within normal limits  CBC WITH DIFFERENTIAL/PLATELET - Abnormal; Notable for the following components:   RBC 3.45 (*)    Hemoglobin 11.8 (*)    HCT 35.4 (*)    MCV 102.6 (*)    MCH 34.2 (*)    RDW 18.3 (*)    Platelets 124 (*)    Lymphs Abs 0.6 (*)    Monocytes Absolute 1.1 (*)    All other components within normal limits  CULTURE, BLOOD (ROUTINE X 2)  CULTURE,  BLOOD (ROUTINE X 2)  SARS CORONAVIRUS 2 (HOSPITAL ORDER, [place] LAB)  NOVEL CORONAVIRUS, NAA (HOSPITAL ORDER, SEND-OUT TO REF LAB)  LACTIC ACID, PLASMA  LACTIC ACID, PLASMA  PROTIME-INR  TROPONIN I (HIGH SENSITIVITY)  TROPONIN I (HIGH SENSITIVITY)   ____________________________________________  EKG   EKG Interpretation  Date/Time:  Wednesday December 24 2018 19:37:19 EDT Ventricular Rate:  79 PR Interval:    QRS Duration: 109 QT Interval:  383 QTC Calculation: 439 R Axis:   -56 Text Interpretation:  Sinus rhythm Incomplete RBBB and LAFB Abnormal R-wave progression, early transition Baseline wander in lead(s) V2 V3 V4 V5 No STEMI  Confirmed by Nanda Quinton [phone]) on 12/24/2018 9:45:54 PM Also confirmed by Nanda Quinton [phone]), editor Philomena Doheny [phone])  on 12/25/2018 7:35:23 AM  ____________________________________________  RADIOLOGY  Dg Chest 2 View  Result Date: 12/24/2018 CLINICAL DATA:  73 year old male being treated for pancreatic cancer. Weakness. EXAM: CHEST - 2 VIEW COMPARISON:  CT Abdomen and Pelvis 09/22/2018 and earlier. FINDINGS: Semi upright AP and lateral views of the chest. Mildly lower lung volumes. Stable cardiac size and mediastinal contours. Visualized tracheal air column is within normal limits. Stable basilar predominant increased interstitial markings, emphysema noted on the prior CT. Bilateral nipple shadows suspected. No pneumothorax, pleural effusion or acute pulmonary opacity. Stable visualized osseous structures. IMPRESSION: 1. No acute cardiopulmonary abnormality. 2.  Emphysema (ICD10-J43.9). Electronically Signed   By: Genevie Ann M.D.   On: 12/24/2018 18:10    ____________________________________________   PROCEDURES  Procedure(s) performed:   Procedures  CRITICAL CARE Performed by: Margette Fast Total critical care time: 35 minutes Critical care time was exclusive of separately billable procedures and treating other  patients. Critical care was necessary to treat or prevent imminent or life-threatening deterioration. Critical care was time spent personally by me on the following activities: development of treatment plan with patient and/or surrogate as well as nursing, discussions with consultants, evaluation of patient's response to treatment, examination of patient, obtaining history from patient or surrogate, ordering and performing treatments and interventions, ordering and review of laboratory studies, ordering and review of radiographic studies, pulse oximetry and re-evaluation of patient's condition.  Nanda Quinton, MD Emergency Medicine  ____________________________________________   INITIAL IMPRESSION / ASSESSMENT AND PLAN / ED COURSE  Pertinent labs & imaging results that were available during my care of the patient were reviewed by me and considered in my medical decision making (see chart for details).   Patient presents to the emergency department for evaluation of fever starting yesterday.  Last chemo was 2 weeks prior.  Satting 85% on room air but tolerating 2 L nasal cannula well.  Temp here 103.1 F.  Plan for sepsis work-up and will add COVID-19 order.   Labs reviewed. COVID negative. Will send sample to the reference lab. No other clear source. Patient immunocompromised with chemo. Plan for obs/abx.   Discussed patient's case with Hospitalist, Dr. Hal Hope to request admission. Patient and family (if present) updated with plan. Care transferred to Hospitalist service.  I reviewed all nursing notes, vitals, pertinent old records, EKGs, labs, imaging (as available).   ____________________________________________  FINAL CLINICAL IMPRESSION(S) / ED DIAGNOSES  Final diagnoses:  Hypoxemia  Fever, unspecified fever cause     MEDICATIONS GIVEN DURING THIS VISIT:  Medications  aspirin EC tablet 81 mg (81 mg Oral Given 12/25/18 1008)  simvastatin (ZOCOR) tablet 40 mg (has no  administration in time range)  folic acid (FOLVITE) tablet 1 mg (1 mg Oral Given 12/25/18 1008)  vitamin B-12 (CYANOCOBALAMIN) tablet 100 mcg (100 mcg Oral Given 12/25/18 1008)  acetaminophen (TYLENOL) tablet 650 mg (650 mg Oral Given 12/25/18 0126)    Or  acetaminophen (TYLENOL) suppository 650 mg ( Rectal See Alternative 12/25/18 0126)  ondansetron (ZOFRAN) tablet 4 mg (has no administration in time range)    Or  ondansetron (ZOFRAN) injection 4 mg (has no administration in time range)  enoxaparin (LOVENOX) injection 40 mg (40 mg Subcutaneous Given 12/25/18 0143)  lactated ringers infusion ( Intravenous Rate/Dose Verify 12/25/18 0528)  metroNIDAZOLE (FLAGYL) IVPB 500 mg (500 mg Intravenous New Bag/Given 12/25/18 0727)  ceFEPIme (MAXIPIME) 2 g in sodium chloride 0.9 % 100 mL IVPB (2 g Intravenous New Bag/Given 12/25/18 1004)  vancomycin (VANCOCIN) IVPB 1000 mg/200 mL premix (  has no administration in time range)  sodium chloride flush (NS) 0.9 % injection 3 mL (3 mLs Intravenous Given 12/24/18 2151)  acetaminophen (TYLENOL) tablet 1,000 mg (1,000 mg Oral Given 12/24/18 1809)  sodium chloride 0.9 % bolus 500 mL (0 mLs Intravenous Stopped 12/24/18 1900)  ceFEPIme (MAXIPIME) 2 g in sodium chloride 0.9 % 100 mL IVPB ( Intravenous Stopped 12/25/18 0204)  metroNIDAZOLE (FLAGYL) IVPB 500 mg (0 mg Intravenous Stopped 12/24/18 2150)  sodium chloride 0.9 % bolus 500 mL (0 mLs Intravenous Stopped 12/24/18 2101)  vancomycin (VANCOCIN) 1,500 mg in sodium chloride 0.9 % 500 mL IVPB (0 mg Intravenous Stopping Infusion hung by another clincian 12/25/18 0114)  lactated ringers bolus 1,000 mL (1,000 mLs Intravenous New Bag/Given 12/24/18 2149)     Note:  This document was prepared using Dragon voice recognition software and may include unintentional dictation errors.  Nanda Quinton, MD Emergency Medicine    Damaria Vachon, [facility], MD 12/25/18 1031

## 2018-12-24 NOTE — ED Notes (Signed)
ED TO INPATIENT HANDOFF REPORT  ED Nurse Name and Phone #: Judye Bos Name/Age/Gender Ricky Conway 73 y.o. male Room/Bed: WA22/WA22  Code Status   Code Status: Prior  Home/SNF/Other Home Patient oriented to: self, place, time and situation Is this baseline? Yes   Triage Complete: Triage complete  Chief Complaint CAncer pt/FEver  Triage Note Pt states he is unsure why he has a fever sine yesterday. Pt's last chemo treatment was 2 weeks ago, and has upcoming appt on Thursday. Denies pain.   Satting 85% RA. Placed on 2L in triage.   Allergies No Known Allergies  Level of Care/Admitting Diagnosis ED Disposition    ED Disposition Condition Comment   Admit  Hospital Area: [place] [[id number]]  Level of Care: Telemetry [5]  Admit to tele based on following criteria: Monitor for Ischemic changes  Covid Evaluation: Person Under Investigation (PUI)  Diagnosis: Acute respiratory failure with hypoxia [facility]) [[id number]]  Admitting Physician: Rise Patience [phone]  Attending Physician: Rise Patience [phone]  Estimated length of stay: past midnight tomorrow  Certification:: I certify this patient will need inpatient services for at least 2 midnights  PT Class (Do Not Modify): Inpatient [101]  PT Acc Code (Do Not Modify): Private [1]       B Medical/Surgery History Past Medical History:  Diagnosis Date  . 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 COPD (chronic obstructive pulmonary disease) ([place])   . Hyperlipidemia   . Hypertension    Past Surgical History:  Procedure Laterality Date  . BILIARY BRUSHING  09/09/2018   Procedure: BILIARY BRUSHING;  Surgeon: Ronnette Juniper, MD;  Location: [facility] ENDOSCOPY;  Service: Gastroenterology;;  . BILIARY STENT PLACEMENT  09/09/2018   Procedure: BILIARY STENT PLACEMENT;  Surgeon: Ronnette Juniper, MD;  Location: [place];  Service: Gastroenterology;;  . ERCP N/A 09/09/2018   Procedure: ENDOSCOPIC RETROGRADE  CHOLANGIOPANCREATOGRAPHY (ERCP);  Surgeon: Ronnette Juniper, MD;  Location: [place];  Service: Gastroenterology;  Laterality: N/A;  . FOOT SURGERY    . HERNIA REPAIR     WITH MESH  . REMOVAL OF STONES  09/09/2018   Procedure: REMOVAL OF STONES;  Surgeon: Ronnette Juniper, MD;  Location: [facility] ENDOSCOPY;  Service: Gastroenterology;;  . Joan Mayans  09/09/2018   Procedure: SPHINCTEROTOMY;  Surgeon: Ronnette Juniper, MD;  Location: [facility] ENDOSCOPY;  Service: Gastroenterology;;     A IV Location/Drains/Wounds Patient Lines/Drains/Airways Status   Active Line/Drains/Airways    Name:   Placement date:   Placement time:   Site:   Days:   Peripheral IV 12/24/18 Left Forearm   12/24/18    1719    Forearm   less than 1   Peripheral IV 12/24/18 Left Antecubital   12/24/18    2008    Antecubital   less than 1   Incision (Closed) 09/11/18 Abdomen Right;Upper   09/11/18    0855     104          Intake/Output Last 24 hours No intake or output data in the 24 hours ending 12/24/18 2042  Labs/Imaging Results for orders placed or performed during the hospital encounter of 12/24/18 (from the past 48 hour(s))  Troponin I (High Sensitivity)     Status: None   Collection Time: 12/24/18  5:19 PM  Result Value Ref Range   Troponin I (High Sensitivity) 11.0 <18 ng/L    Comment: (NOTE) Elevated high sensitivity troponin I (hsTnI) values and significant  changes across serial measurements may suggest ACS  but many other  chronic and acute conditions are known to elevate hsTnI results.  Refer to the "Links" section for chest pain algorithms and additional  guidance. Performed at [facility], [place] [address] [place], [place] [address]   Comprehensive metabolic panel     Status: Abnormal   Collection Time: 12/24/18  5:20 PM  Result Value Ref Range   Sodium 128 (L) 135 - 145 mmol/L   Potassium 3.5 3.5 - 5.1 mmol/L   Chloride 92 (L) 98 - 111 mmol/L   CO2 25 22 - 32 mmol/L   Glucose, Bld 135 (H) 70  - 99 mg/dL   BUN 15 8 - 23 mg/dL   Creatinine, Ser 0.64 0.61 - 1.24 mg/dL   Calcium 8.3 (L) 8.9 - 10.3 mg/dL   Total Protein 6.9 6.5 - 8.1 g/dL   Albumin 3.8 3.5 - 5.0 g/dL   AST 32 15 - 41 U/L   ALT 39 0 - 44 U/L   Alkaline Phosphatase 94 38 - 126 U/L   Total Bilirubin 0.6 0.3 - 1.2 mg/dL   GFR calc non Af Amer >60 >60 mL/min   GFR calc Af Amer >60 >60 mL/min   Anion gap 11 5 - 15    Comment: Performed at [facility], [place] [address] [place], [place] [address]  Lactic acid, plasma     Status: None   Collection Time: 12/24/18  5:20 PM  Result Value Ref Range   Lactic Acid, Venous 1.0 0.5 - 1.9 mmol/L    Comment: Performed at [facility], [place] [address] [place], [place] [address]  CBC with Differential     Status: Abnormal   Collection Time: 12/24/18  5:20 PM  Result Value Ref Range   WBC 7.5 4.0 - 10.5 K/uL   RBC 4.04 (L) 4.22 - 5.81 MIL/uL   Hemoglobin 14.2 13.0 - 17.0 g/dL   HCT 40.4 39.0 - 52.0 %   MCV 100.0 80.0 - 100.0 fL   MCH 35.1 (H) 26.0 - 34.0 pg   MCHC 35.1 30.0 - 36.0 g/dL   RDW 18.3 (H) 11.5 - 15.5 %   Platelets 131 (L) 150 - 400 K/uL    Comment: Immature Platelet Fraction may be clinically indicated, consider ordering this additional test [id number]    nRBC 0.0 0.0 - 0.2 %   Neutrophils Relative % 80 %   Neutro Abs 5.9 1.7 - 7.7 K/uL   Lymphocytes Relative 5 %   Lymphs Abs 0.4 (L) 0.7 - 4.0 K/uL   Monocytes Relative 15 %   Monocytes Absolute 1.1 (H) 0.1 - 1.0 K/uL   Eosinophils Relative 0 %   Eosinophils Absolute 0.0 0.0 - 0.5 K/uL   Basophils Relative 0 %   Basophils Absolute 0.0 0.0 - 0.1 K/uL   Immature Granulocytes 0 %   Abs Immature Granulocytes 0.03 0.00 - 0.07 K/uL    Comment: Performed at [facility], [place] [address], [place], [place] [address]  Protime-INR     Status: None   Collection Time: 12/24/18  5:20 PM  Result Value Ref Range   Prothrombin Time 14.2 11.4 - 15.2 seconds   INR  1.1 0.8 - 1.2    Comment: (NOTE) INR goal varies based on device and disease states. Performed at [facility], [place] [address] [place], [place] [address]   SARS Coronavirus 2 (CEPHEID- Performed in [facility] hospital lab), [facility]  Status: None   Collection Time: 12/24/18  5:26 PM   Specimen: Nasopharyngeal Swab  Result Value Ref Range   SARS Coronavirus 2 NEGATIVE NEGATIVE    Comment: (NOTE) If result is NEGATIVE SARS-CoV-2 target nucleic acids are NOT DETECTED. The SARS-CoV-2 RNA is generally detectable in upper and lower  respiratory specimens during the acute phase of infection. The lowest  concentration of SARS-CoV-2 viral copies this assay can detect is 250  copies / mL. A negative result does not preclude SARS-CoV-2 infection  and should not be used as the sole basis for treatment or other  patient management decisions.  A negative result may occur with  improper specimen collection / handling, submission of specimen other  than nasopharyngeal swab, presence of viral mutation(s) within the  areas targeted by this assay, and inadequate number of viral copies  (<250 copies / mL). A negative result must be combined with clinical  observations, patient history, and epidemiological information. If result is POSITIVE SARS-CoV-2 target nucleic acids are DETECTED. The SARS-CoV-2 RNA is generally detectable in upper and lower  respiratory specimens dur ing the acute phase of infection.  Positive  results are indicative of active infection with SARS-CoV-2.  Clinical  correlation with patient history and other diagnostic information is  necessary to determine patient infection status.  Positive results do  not rule out bacterial infection or co-infection with other viruses. If result is PRESUMPTIVE POSTIVE SARS-CoV-2 nucleic acids MAY BE PRESENT.   A presumptive positive result was obtained on the submitted specimen  and confirmed on repeat testing.  While  2019 novel coronavirus  (SARS-CoV-2) nucleic acids may be present in the submitted sample  additional confirmatory testing may be necessary for epidemiological  and / or clinical management purposes  to differentiate between  SARS-CoV-2 and other Sarbecovirus currently known to infect humans.  If clinically indicated additional testing with an alternate test  methodology [phone]) is advised. The SARS-CoV-2 RNA is generally  detectable in upper and lower respiratory sp ecimens during the acute  phase of infection. The expected result is Negative. Fact Sheet for Patients:  StrictlyIdeas.no Fact Sheet for Healthcare Providers: BankingDealers.co.za This test is not yet approved or cleared by the [place] FDA and has been authorized for detection and/or diagnosis of SARS-CoV-2 by FDA under an Emergency Use Authorization (EUA).  This EUA will remain in effect (meaning this test can be used) for the duration of the COVID-19 declaration under Section 564(b)(1) of the Act, 21 U.S.C. section 360bbb-3(b)(1), unless the authorization is terminated or revoked sooner. Performed at [facility], [place] [address] [place], [place] [address]    Dg Chest 2 View  Result Date: 12/24/2018 CLINICAL DATA:  73 year old male being treated for pancreatic cancer. Weakness. EXAM: CHEST - 2 VIEW COMPARISON:  CT Abdomen and Pelvis 09/22/2018 and earlier. FINDINGS: Semi upright AP and lateral views of the chest. Mildly lower lung volumes. Stable cardiac size and mediastinal contours. Visualized tracheal air column is within normal limits. Stable basilar predominant increased interstitial markings, emphysema noted on the prior CT. Bilateral nipple shadows suspected. No pneumothorax, pleural effusion or acute pulmonary opacity. Stable visualized osseous structures. IMPRESSION: 1. No acute cardiopulmonary abnormality. 2.  Emphysema (ICD10-J43.9).  Electronically Signed   By: Genevie Ann M.D.   On: 12/24/2018 18:10    Pending Labs Unresulted Labs (From admission, onward)    Start     Ordered   12/24/18 1945  Novel Coronavirus,NAA,(SEND-OUT TO REF LAB - TAT 24-48  hrs); Hosp Order  (Symptomatic Patients Labs with Precautions )  ONCE - STAT,   STAT    Question Answer Comment  Current symptoms Fever and Shortness of breath   Excluded other viral illnesses Yes   Exposure Risk None   Patient immune status Immunocompromised      12/24/18 1944   12/24/18 1726  Troponin I (High Sensitivity)  STAT Now then every 2 hours,   STAT     12/24/18 1726   12/24/18 1655  Lactic acid, plasma  Now then every 2 hours,   STAT     12/24/18 1654   12/24/18 1655  Culture, blood (Routine x 2)  BLOOD CULTURE X 2,   STAT     12/24/18 1654   12/24/18 1655  Urinalysis, Routine w reflex microscopic  ONCE - STAT,   STAT     12/24/18 1654   Signed and Held  Basic metabolic panel  Tomorrow morning,   R     Signed and Held   Signed and Held  CBC  (enoxaparin (LOVENOX)    CrCl >/= 30 ml/min)  Once,   R    Comments: Baseline for enoxaparin therapy IF NOT ALREADY DRAWN.  Notify MD if PLT < 100 K.    Signed and Held   Signed and Held  Creatinine, serum  (enoxaparin (LOVENOX)    CrCl >/= 30 ml/min)  Once,   R    Comments: Baseline for enoxaparin therapy IF NOT ALREADY DRAWN.    Signed and Held   Signed and Held  Creatinine, serum  (enoxaparin (LOVENOX)    CrCl >/= 30 ml/min)  Weekly,   R    Comments: while on enoxaparin therapy    Signed and Held   Signed and Held  Hepatic function panel  Tomorrow morning,   R     Signed and Held   Signed and Held  CBC WITH DIFFERENTIAL  Tomorrow morning,   R     Signed and Held          Vitals/Pain Today's Vitals   12/24/18 1930 12/24/18 1954 12/24/18 1955 12/24/18 2000  BP: (!) 105/59 (!) 99/54  (!) 100/57  Pulse:   74 73  Resp: (!) 21 (!) 21 14 (!) 21  Temp: 98.7 F (37.1 C)     TempSrc: Oral     SpO2:    94%   Weight:      Height:      PainSc:        Isolation Precautions Airborne and Contact precautions  Medications Medications  sodium chloride flush (NS) 0.9 % injection 3 mL (has no administration in time range)  ceFEPIme (MAXIPIME) 2 g in sodium chloride 0.9 % 100 mL IVPB (has no administration in time range)  metroNIDAZOLE (FLAGYL) IVPB 500 mg (500 mg Intravenous New Bag/Given 12/24/18 2022)  vancomycin (VANCOCIN) 1,500 mg in sodium chloride 0.9 % 500 mL IVPB (has no administration in time range)  acetaminophen (TYLENOL) tablet 1,000 mg (1,000 mg Oral Given 12/24/18 1809)  sodium chloride 0.9 % bolus 500 mL (500 mLs Intravenous New Bag/Given 12/24/18 1813)  sodium chloride 0.9 % bolus 500 mL (500 mLs Intravenous New Bag/Given 12/24/18 2019)    Mobility walks Low fall risk   Focused Assessments NA   R Recommendations: See Admitting Provider Note  Report given to:   Additional Notes: NA

## 2018-12-24 NOTE — Telephone Encounter (Signed)
Voice mail from wife that patient "has had a fever all day". Called her back and she reports he has been having shaking chills all day. Fever 103 first thing this morning and now 102. Instructed her due to time of day, she needs to take him to the [facility] emergency room and let them know he is a chemo patient and they will fast track him. Explained to wife he is very immunocompromised and could be septic. She needs to call at the first sign of a fever or infection in the future. She will take him to ER now.

## 2018-12-24 NOTE — ED Triage Notes (Signed)
Pt states he is unsure why he has a fever sine yesterday. Pt's last chemo treatment was 2 weeks ago, and has upcoming appt on Thursday. Denies pain.   Satting 85% RA. Placed on 2L in triage.

## 2018-12-25 ENCOUNTER — Other Ambulatory Visit: Payer: Self-pay

## 2018-12-25 ENCOUNTER — Inpatient Hospital Stay (HOSPITAL_COMMUNITY): Payer: 59

## 2018-12-25 DIAGNOSIS — J9621 Acute and chronic respiratory failure with hypoxia: Secondary | ICD-10-CM | POA: Diagnosis not present

## 2018-12-25 DIAGNOSIS — R509 Fever, unspecified: Secondary | ICD-10-CM | POA: Diagnosis not present

## 2018-12-25 DIAGNOSIS — E876 Hypokalemia: Secondary | ICD-10-CM

## 2018-12-25 DIAGNOSIS — I10 Essential (primary) hypertension: Secondary | ICD-10-CM | POA: Diagnosis not present

## 2018-12-25 DIAGNOSIS — A419 Sepsis, unspecified organism: Secondary | ICD-10-CM

## 2018-12-25 DIAGNOSIS — J9601 Acute respiratory failure with hypoxia: Secondary | ICD-10-CM | POA: Diagnosis not present

## 2018-12-25 LAB — BASIC METABOLIC PANEL
Anion gap: 9 (ref 5–15)
BUN: 15 mg/dL (ref 8–23)
CO2: 23 mmol/L (ref 22–32)
Calcium: 7.9 mg/dL — ABNORMAL LOW (ref 8.9–10.3)
Chloride: 101 mmol/L (ref 98–111)
Creatinine, Ser: 0.52 mg/dL — ABNORMAL LOW (ref 0.61–1.24)
GFR calc Af Amer: >60 mL/min (ref >60)
GFR calc non Af Amer: >60 mL/min (ref >60)
Glucose, Bld: 122 mg/dL — ABNORMAL HIGH (ref 70–99)
Potassium: 3.2 mmol/L — ABNORMAL LOW (ref 3.5–5.1)
Sodium: 133 mmol/L — ABNORMAL LOW (ref 135–145)

## 2018-12-25 LAB — CBC WITH DIFFERENTIAL/PLATELET
Abs Immature Granulocytes: 0.02 10*3/uL (ref 0.00–0.07)
Basophils Absolute: 0 10*3/uL (ref 0.0–0.1)
Basophils Relative: 0 %
Eosinophils Absolute: 0 10*3/uL (ref 0.0–0.5)
Eosinophils Relative: 0 %
HCT: 35.4 % — ABNORMAL LOW (ref 39.0–52.0)
Hemoglobin: 11.8 g/dL — ABNORMAL LOW (ref 13.0–17.0)
Immature Granulocytes: 0 %
Lymphocytes Relative: 10 %
Lymphs Abs: 0.6 10*3/uL — ABNORMAL LOW (ref 0.7–4.0)
MCH: 34.2 pg — ABNORMAL HIGH (ref 26.0–34.0)
MCHC: 33.3 g/dL (ref 30.0–36.0)
MCV: 102.6 fL — ABNORMAL HIGH (ref 80.0–100.0)
Monocytes Absolute: 1.1 10*3/uL — ABNORMAL HIGH (ref 0.1–1.0)
Monocytes Relative: 17 %
Neutro Abs: 4.7 10*3/uL (ref 1.7–7.7)
Neutrophils Relative %: 73 %
Platelets: 124 10*3/uL — ABNORMAL LOW (ref 150–400)
RBC: 3.45 MIL/uL — ABNORMAL LOW (ref 4.22–5.81)
RDW: 18.3 % — ABNORMAL HIGH (ref 11.5–15.5)
WBC: 6.5 10*3/uL (ref 4.0–10.5)
nRBC: 0 % (ref 0.0–0.2)

## 2018-12-25 LAB — HEPATIC FUNCTION PANEL
ALT: 32 U/L (ref 0–44)
AST: 38 U/L (ref 15–41)
Albumin: 3 g/dL — ABNORMAL LOW (ref 3.5–5.0)
Alkaline Phosphatase: 67 U/L (ref 38–126)
Bilirubin, Direct: 0.2 mg/dL (ref 0.0–0.2)
Indirect Bilirubin: 0.3 mg/dL (ref 0.3–0.9)
Total Bilirubin: 0.5 mg/dL (ref 0.3–1.2)
Total Protein: 5.5 g/dL — ABNORMAL LOW (ref 6.5–8.1)

## 2018-12-25 LAB — MAGNESIUM: Magnesium: 1.5 mg/dL — ABNORMAL LOW (ref 1.7–2.4)

## 2018-12-25 MED ORDER — POTASSIUM CHLORIDE CRYS ER 20 MEQ PO TBCR
40.0000 meq | EXTENDED_RELEASE_TABLET | Freq: Once | ORAL | Status: AC
  Administered 2018-12-25: 40 meq via ORAL
  Filled 2018-12-25: qty 2

## 2018-12-25 MED ORDER — MAGNESIUM SULFATE 2 GM/50ML IV SOLN
2.0000 g | Freq: Once | INTRAVENOUS | Status: AC
  Administered 2018-12-25: 2 g via INTRAVENOUS
  Filled 2018-12-25: qty 50

## 2018-12-25 MED ORDER — VANCOMYCIN HCL IN DEXTROSE 1-5 GM/200ML-% IV SOLN
1000.0000 mg | Freq: Two times a day (BID) | INTRAVENOUS | Status: DC
  Administered 2018-12-25 – 2018-12-26 (×3): 1000 mg via INTRAVENOUS
  Filled 2018-12-25 (×3): qty 200

## 2018-12-25 MED ORDER — SODIUM CHLORIDE 0.9 % IV SOLN
2.0000 g | Freq: Three times a day (TID) | INTRAVENOUS | Status: DC
  Administered 2018-12-25 – 2018-12-26 (×4): 2 g via INTRAVENOUS
  Filled 2018-12-25 (×7): qty 2

## 2018-12-25 MED ORDER — METRONIDAZOLE IN NACL 5-0.79 MG/ML-% IV SOLN
500.0000 mg | Freq: Three times a day (TID) | INTRAVENOUS | Status: DC
  Administered 2018-12-25 – 2018-12-26 (×4): 500 mg via INTRAVENOUS
  Filled 2018-12-25 (×4): qty 100

## 2018-12-25 NOTE — Plan of Care (Signed)
  Problem: Education: Goal: Knowledge of General Education information will improve Description Including pain rating scale, medication(s)/side effects and non-pharmacologic comfort measures Outcome: Progressing   Problem: Health Behavior/Discharge Planning: Goal: Ability to manage health-related needs will improve Outcome: Progressing   

## 2018-12-25 NOTE — Progress Notes (Signed)
Spoke with wife and updated per Dr. Benay Spice. Dr. Benay Spice plans to follow up with pt on tomorrow. SRP, RN

## 2018-12-25 NOTE — Progress Notes (Addendum)
PROGRESS NOTE   Ricky Conway  OEU:[id number]    DOB: [birth year]    DOA: 12/24/2018  PCP: Mayra Neer, MD   I have briefly reviewed patients previous medical records in [facility].  Chief Complaint  Patient presents with  . Fever    Brief Narrative:  73 year old male with PMH of lung cancer with abdominal meta stasis/mass causing biliary obstruction, s/p biliary stent by [facility], has Port-A-Cath, on chemotherapy, COPD, HTN, HLD, presented to ED with complaints of fever and chills of 2 days duration.  In ED hypotensive and febrile to 103 F.  Infectious work-up thus far negative.  Admitted for fever/suspected sepsis.   Assessment & Plan:   Principal Problem:   Acute respiratory failure with hypoxia (HCC) Active Problems:   Common biliary duct obstruction   Hypertension   Hyperlipidemia   COPD, mild (HCC)   Fever   Fever/suspected sepsis  Source not clear yet but strong concern for bacteremia related to biliary stent, as discussed with Dr. Benay Spice.  Spouse indicates that patient does not have a Port-A-Cath.  Continue empiric IV cefepime, vancomycin and metronidazole.  If blood cultures become positive, that will guide further treatment.  However if blood cultures negative and patient continues to spike fevers then Dr. Benay Spice recommends GI consultation to evaluate the biliary stent.  No leukocytosis.  Blood cultures x2: Negative thus far.  SARS coronavirus 2 testing negative but repeat COVID test pending.  LFTs unremarkable.  Chest x-ray without acute abnormalities.  No clear symptomatology to identify source.  Sepsis features present on admission.  Acute respiratory failure with hypoxia  Unclear etiology.  Initial chest x-ray negative.  Will repeat to see if there are any new changes post hydration.  Initial COVID testing negative follow repeat.  Wean oxygen as tolerated.  Dehydration with hyponatremia  Likely precipitated by febrile illness.   Improved after IV fluids.  Encourage oral intake.  Discontinue IV fluids.  Essential hypertension  Holding antihypertensives due to febrile illness and soft blood pressures.  Was hypotensive in the ED.  That resolved after IV fluid bolus.  Hyperlipidemia  Statins to continue  COPD  No clinical bronchospasm.  Monitor.  Primary lung cancer with metastasis  Discussed with Dr. Benay Spice, management as outlined above and he will see patient tomorrow.  Hypokalemia  Replace and follow.  Check magnesium.  Thrombocytopenia  Likely related to cancer chemotherapy.  No bleeding.  Follow CBC.  Normocytic anemia  Suspect multifactorial due to hemodilution, cancer chemotherapy and acute infectious illness.  Follow CBC daily.  PPE: Followed complete precautions including Caper, gown, gloves. DVT prophylaxis: Lovenox.  May have to discontinue if thrombocytopenia worsens. Code Status: Full Family Communication: Discussed in detail with patient spouse, updated care and answered questions. Disposition: To be determined pending clinical improvement.   Consultants:  Medical oncology  Procedures:  None  Antimicrobials:  IV vancomycin, cefepime and metronidazole   Subjective: Patient denies complaints.  Denies sick contacts.  States that he feels better now that fevers are down and insists on going home.  Denies cough, sore throat, dyspnea, nausea, vomiting, dysuria or skin lesions.  ROS: As above, otherwise negative.  Objective:  Vitals:   12/24/18 2200 12/24/18 2246 12/25/18 0400 12/25/18 0622  BP: (!) 101/52 (!) 101/57  107/60  Pulse: 68 70  65  Resp: 18 14  16   Temp:  98.1 F (36.7 C) 98.9 F (37.2 C) 98 F (36.7 C)  TempSrc:  Oral  Oral  SpO2:  95% 98%  93%  Weight:      Height:        Examination:  General exam: Pleasant elderly male, moderately built and nourished lying comfortably propped up in bed without distress.  Oral mucosa moist. Respiratory system: Clear  to auscultation. Respiratory effort normal. Cardiovascular system: S1 & S2 heard, RRR. No JVD, murmurs, rubs, gallops or clicks. No pedal edema.  Telemetry personally reviewed: Sinus rhythm. Gastrointestinal system: Abdomen is nondistended, soft and nontender. No organomegaly or masses felt. Normal bowel sounds heard. Central nervous system: Alert and oriented. No focal neurological deficits. Extremities: Symmetric 5 x 5 power. Skin: No rashes, lesions or ulcers Psychiatry: Judgement and insight appear normal. Mood & affect appropriate.     Data Reviewed: I have personally reviewed following labs and imaging studies  CBC: Recent Labs  Lab 12/24/18 1720 12/25/18 0503  WBC 7.5 6.5  NEUTROABS 5.9 4.7  HGB 14.2 11.8*  HCT 40.4 35.4*  MCV 100.0 102.6*  PLT 131* 163*   Basic Metabolic Panel: Recent Labs  Lab 12/24/18 1720 12/25/18 0503  NA 128* 133*  K 3.5 3.2*  CL 92* 101  CO2 25 23  GLUCOSE 135* 122*  BUN 15 15  CREATININE 0.64 0.52*  CALCIUM 8.3* 7.9*   Liver Function Tests: Recent Labs  Lab 12/24/18 1720 12/25/18 0503  AST 32 38  ALT 39 32  ALKPHOS 94 67  BILITOT 0.6 0.5  PROT 6.9 5.5*  ALBUMIN 3.8 3.0*   Coagulation Profile: Recent Labs  Lab 12/24/18 1720  INR 1.1     Recent Results (from the past 240 hour(s))  Culture, blood (Routine x 2)     Status: None (Preliminary result)   Collection Time: 12/24/18  5:19 PM   Specimen: BLOOD LEFT FOREARM  Result Value Ref Range Status   Specimen Description   Final    BLOOD LEFT FOREARM Performed at [facility] [address], [place], [place] [address]    Special Requests   Final    BOTTLES DRAWN AEROBIC AND ANAEROBIC Blood Culture adequate volume Performed at [facility] [address], [place], [place] [address]    Culture   Final    NO GROWTH < 12 HOURS Performed at [facility] [address] [place], [place] [address]    Report Status PENDING   Incomplete  Culture, blood (Routine x 2)     Status: None (Preliminary result)   Collection Time: 12/24/18  5:19 PM   Specimen: BLOOD  Result Value Ref Range Status   Specimen Description   Final    BLOOD LEFT WRIST Performed at [facility] [address], [place], [place] [address]    Special Requests   Final    BOTTLES DRAWN AEROBIC AND ANAEROBIC Blood Culture results may not be optimal due to an excessive volume of blood received in culture bottles Performed at [facility] [address] [place], [place] [address]    Culture   Final    NO GROWTH < 12 HOURS Performed at [facility] [address] [place], [place] [address]    Report Status PENDING  Incomplete  SARS Coronavirus 2 (CEPHEID- Performed in [facility] hospital lab), Hosp Order     Status: None   Collection Time: 12/24/18  5:26 PM   Specimen: Nasopharyngeal Swab  Result Value Ref Range Status   SARS Coronavirus 2 NEGATIVE NEGATIVE Final    Comment: (NOTE)  If result is NEGATIVE SARS-CoV-2 target nucleic acids are NOT DETECTED. The SARS-CoV-2 RNA is generally detectable in upper and lower  respiratory specimens during the acute phase of infection. The lowest  concentration of SARS-CoV-2 viral copies this assay can detect is 250  copies / mL. A negative result does not preclude SARS-CoV-2 infection  and should not be used as the sole basis for treatment or other  patient management decisions.  A negative result may occur with  improper specimen collection / handling, submission of specimen other  than nasopharyngeal swab, presence of viral mutation(s) within the  areas targeted by this assay, and inadequate number of viral copies  (<250 copies / mL). A negative result must be combined with clinical  observations, patient history, and epidemiological information. If result is POSITIVE SARS-CoV-2 target nucleic acids are DETECTED. The SARS-CoV-2 RNA is generally  detectable in upper and lower  respiratory specimens dur ing the acute phase of infection.  Positive  results are indicative of active infection with SARS-CoV-2.  Clinical  correlation with patient history and other diagnostic information is  necessary to determine patient infection status.  Positive results do  not rule out bacterial infection or co-infection with other viruses. If result is PRESUMPTIVE POSTIVE SARS-CoV-2 nucleic acids MAY BE PRESENT.   A presumptive positive result was obtained on the submitted specimen  and confirmed on repeat testing.  While 2019 novel coronavirus  (SARS-CoV-2) nucleic acids may be present in the submitted sample  additional confirmatory testing may be necessary for epidemiological  and / or clinical management purposes  to differentiate between  SARS-CoV-2 and other Sarbecovirus currently known to infect humans.  If clinically indicated additional testing with an alternate test  methodology [phone]) is advised. The SARS-CoV-2 RNA is generally  detectable in upper and lower respiratory sp ecimens during the acute  phase of infection. The expected result is Negative. Fact Sheet for Patients:  StrictlyIdeas.no Fact Sheet for Healthcare Providers: BankingDealers.co.za This test is not yet approved or cleared by the [place] FDA and has been authorized for detection and/or diagnosis of SARS-CoV-2 by FDA under an Emergency Use Authorization (EUA).  This EUA will remain in effect (meaning this test can be used) for the duration of the COVID-19 declaration under Section 564(b)(1) of the Act, 21 U.S.C. section 360bbb-3(b)(1), unless the authorization is terminated or revoked sooner. Performed at [facility], [place] [address], [place], [place] [address]          Radiology Studies: Dg Chest 2 View  Result Date: 12/24/2018 CLINICAL DATA:  73 year old male being treated for  pancreatic cancer. Weakness. EXAM: CHEST - 2 VIEW COMPARISON:  CT Abdomen and Pelvis 09/22/2018 and earlier. FINDINGS: Semi upright AP and lateral views of the chest. Mildly lower lung volumes. Stable cardiac size and mediastinal contours. Visualized tracheal air column is within normal limits. Stable basilar predominant increased interstitial markings, emphysema noted on the prior CT. Bilateral nipple shadows suspected. No pneumothorax, pleural effusion or acute pulmonary opacity. Stable visualized osseous structures. IMPRESSION: 1. No acute cardiopulmonary abnormality. 2.  Emphysema (ICD10-J43.9). Electronically Signed   By: Genevie Ann M.D.   On: 12/24/2018 18:10        Scheduled Meds: . aspirin EC  81 mg Oral Daily  . enoxaparin (LOVENOX) injection  40 mg Subcutaneous QHS  . folic acid  1 mg Oral Daily  . simvastatin  40 mg Oral q1800  . vitamin B-12  100 mcg Oral Daily   Continuous  Infusions: . ceFEPime (MAXIPIME) IV 2 g (12/25/18 1004)  . lactated ringers 125 mL/hr at 12/25/18 1141  . metronidazole 500 mg (12/25/18 0727)  . vancomycin 1,000 mg (12/25/18 1142)     LOS: 1 day     Vernell Leep, MD, FACP, [facility]. [facility]  To contact the attending provider between 7A-7P or the covering provider during after hours 7P-7A, please log into the web site [email or web address] and access using universal [facility] password for that web site. If you do not have the password, please call the hospital operator.  12/25/2018, 1:35 PM

## 2018-12-25 NOTE — Progress Notes (Signed)
Pharmacy Antibiotic Note  Ricky Conway is a 73 y.o. male admitted on 12/24/2018 with sepsis.  Pharmacy has been consulted for Vancomycin, cefepime dosing.  Plan:  Vancomycin 1500mg  iv x1, then Vancomycin 1000 mg IV Q 12 hrs. Goal AUC 400-550. Expected AUC: 482 SCr used: 0.8 adjusted (0.52)  Cefepime 2gm iv q8hr   Height: 5' 10.5" (179.1 cm) Weight: 167 lb (75.8 kg) IBW/kg (Calculated) : 74.15  Temp (24hrs), Avg:99.4 F (37.4 C), Min:98 F (36.7 C), Max:103.1 F (39.5 C)  Recent Labs  Lab 12/24/18 1720 12/24/18 1855 12/25/18 0503  WBC 7.5  --  6.5  CREATININE 0.64  --  0.52*  LATICACIDVEN 1.0 0.9  --     Estimated Creatinine Clearance: 85 mL/min (A) (by C-G formula based on SCr of 0.52 mg/dL (L)).    No Known Allergies  Antimicrobials this admission: Vancomycin 12/25/2018 >> Cefepime 12/25/2018 >>   Dose adjustments this admission: -  Microbiology results: -  Thank you for allowing pharmacy to be a part of this patient's care.  Ricky Conway 12/25/2018 6:28 AM

## 2018-12-26 ENCOUNTER — Inpatient Hospital Stay (HOSPITAL_COMMUNITY): Payer: 59

## 2018-12-26 ENCOUNTER — Encounter (HOSPITAL_COMMUNITY): Payer: Self-pay | Admitting: Radiology

## 2018-12-26 DIAGNOSIS — K769 Liver disease, unspecified: Secondary | ICD-10-CM

## 2018-12-26 DIAGNOSIS — R509 Fever, unspecified: Secondary | ICD-10-CM | POA: Diagnosis not present

## 2018-12-26 DIAGNOSIS — K869 Disease of pancreas, unspecified: Secondary | ICD-10-CM

## 2018-12-26 DIAGNOSIS — C3490 Malignant neoplasm of unspecified part of unspecified bronchus or lung: Secondary | ICD-10-CM | POA: Diagnosis not present

## 2018-12-26 DIAGNOSIS — E785 Hyperlipidemia, unspecified: Secondary | ICD-10-CM

## 2018-12-26 DIAGNOSIS — J9621 Acute and chronic respiratory failure with hypoxia: Secondary | ICD-10-CM

## 2018-12-26 DIAGNOSIS — J9601 Acute respiratory failure with hypoxia: Secondary | ICD-10-CM | POA: Diagnosis not present

## 2018-12-26 DIAGNOSIS — R5081 Fever presenting with conditions classified elsewhere: Secondary | ICD-10-CM

## 2018-12-26 LAB — CBC WITH DIFFERENTIAL/PLATELET
Abs Immature Granulocytes: 0.03 10*3/uL (ref 0.00–0.07)
Basophils Absolute: 0 10*3/uL (ref 0.0–0.1)
Basophils Relative: 0 %
Eosinophils Absolute: 0 10*3/uL (ref 0.0–0.5)
Eosinophils Relative: 1 %
HCT: 34.1 % — ABNORMAL LOW (ref 39.0–52.0)
Hemoglobin: 11.7 g/dL — ABNORMAL LOW (ref 13.0–17.0)
Immature Granulocytes: 1 %
Lymphocytes Relative: 12 %
Lymphs Abs: 0.8 10*3/uL (ref 0.7–4.0)
MCH: 35.2 pg — ABNORMAL HIGH (ref 26.0–34.0)
MCHC: 34.3 g/dL (ref 30.0–36.0)
MCV: 102.7 fL — ABNORMAL HIGH (ref 80.0–100.0)
Monocytes Absolute: 1.5 10*3/uL — ABNORMAL HIGH (ref 0.1–1.0)
Monocytes Relative: 22 %
Neutro Abs: 4.3 10*3/uL (ref 1.7–7.7)
Neutrophils Relative %: 64 %
Platelets: 144 10*3/uL — ABNORMAL LOW (ref 150–400)
RBC: 3.32 MIL/uL — ABNORMAL LOW (ref 4.22–5.81)
RDW: 18.3 % — ABNORMAL HIGH (ref 11.5–15.5)
WBC: 6.6 10*3/uL (ref 4.0–10.5)
nRBC: 0 % (ref 0.0–0.2)

## 2018-12-26 LAB — BASIC METABOLIC PANEL
Anion gap: 9 (ref 5–15)
BUN: 8 mg/dL (ref 8–23)
CO2: 23 mmol/L (ref 22–32)
Calcium: 7.7 mg/dL — ABNORMAL LOW (ref 8.9–10.3)
Chloride: 99 mmol/L (ref 98–111)
Creatinine, Ser: 0.45 mg/dL — ABNORMAL LOW (ref 0.61–1.24)
GFR calc Af Amer: >60 mL/min (ref >60)
GFR calc non Af Amer: >60 mL/min (ref >60)
Glucose, Bld: 104 mg/dL — ABNORMAL HIGH (ref 70–99)
Potassium: 3.6 mmol/L (ref 3.5–5.1)
Sodium: 131 mmol/L — ABNORMAL LOW (ref 135–145)

## 2018-12-26 LAB — MAGNESIUM: Magnesium: 1.7 mg/dL (ref 1.7–2.4)

## 2018-12-26 LAB — NOVEL CORONAVIRUS, NAA (HOSP ORDER, SEND-OUT TO REF LAB; TAT 18-24 HRS): SARS-CoV-2, NAA: NOT DETECTED

## 2018-12-26 MED ORDER — IOHEXOL 300 MG/ML  SOLN
100.0000 mL | Freq: Once | INTRAMUSCULAR | Status: AC | PRN
  Administered 2018-12-26: 100 mL via INTRAVENOUS

## 2018-12-26 MED ORDER — CIPROFLOXACIN HCL 500 MG PO TABS: 500.0000 mg | ORAL_TABLET | Freq: Two times a day (BID) | ORAL | 0 refills | Status: AC

## 2018-12-26 MED ORDER — SODIUM CHLORIDE (PF) 0.9 % IJ SOLN
INTRAMUSCULAR | Status: AC
  Filled 2018-12-26: qty 50

## 2018-12-26 NOTE — Discharge Summary (Signed)
Physician Discharge Summary  Ricky Conway KVQ:[id number] DOB: April 03, 1946  PCP: Mayra Neer, MD  Admit date: 12/24/2018 Discharge date: 12/26/2018  Recommendations for Outpatient Follow-up:  1. Dr. Dominica Severin B. Benay Spice, Oncology: Patient to keep prior appointment for 01/01/2019 with labs.  Please follow final blood culture results that were sent from the hospital. 2. Dr. Mayra Neer, PCP.  Home Health: None Equipment/Devices: Patient vehemently declined home oxygen arrangement despite meeting requirement for same.  Discharge Condition: Improved and stable CODE STATUS: Full Diet recommendation: Heart healthy diet.  Discharge Diagnoses:  Principal Problem:   Acute respiratory failure with hypoxia ([place]) Active Problems:   Common biliary duct obstruction   Hypertension   Hyperlipidemia   COPD, mild (HCC)   Fever   Brief Summary: 73 year old male with PMH of lung cancer with abdominal meta stasis/mass causing biliary obstruction, s/p biliary stent by [facility], has Port-A-Cath, on chemotherapy, COPD, HTN, HLD, presented to ED with complaints of fever and chills of 2 days duration.  In ED hypotensive and febrile to 103 F.  Infectious work-up thus far negative.  Admitted for fever/suspected sepsis.   Assessment & Plan:   Fever of unknown origin  Source not clear despite extensive evaluation.  Treated empirically IV cefepime, vancomycin and metronidazole and completed 2 days course.  No leukocytosis.  Blood cultures x2: Negative to date after 48 hours.  SARS coronavirus 2 testing negative x2.  LFTs unremarkable.  Chest x-ray without acute abnormalities even on repeat.  No clear symptomatology to identify source.  Sepsis features present on admission but promptly resolved.  He really had a single spike of high fever and no recurrence since.  I discussed with Dr. Benay Spice who saw him today and recommended discharging him on oral Cipro for additional 5 days and follow  cultures.  Improved and stable.  Acute on chronic respiratory failure with hypoxia  Suspect due to underlying COPD and some atelectasis.  Chest x-ray x2 without acute findings.  COVID 19 testing x2 was negative.  Even though patient is asymptomatic, he is persistently hypoxic and meets criteria for home oxygen.  Multiple staff have discussed in detail with the patient including this MD regarding importance of being on home oxygen that we wanted to arrange for him.  However he states that he has his wife's home and portable oxygen which they use as needed and may even be checking their pulse oximetry at home.  He was advised that he should not be sharing someone else's medical device but he insisted that he would not accept oxygen arranged by [place].  Patient is coherent and has capacity to make medical decisions but is making poor judgment.  He was advised to follow-up with his Oncologist or PCP if he changed his mind.  Dehydration with hyponatremia  Likely precipitated by febrile illness.  Resolved.  Essential hypertension  She was hypotensive in the ED which resolved after IV fluids.  Antihypertensives were temporarily held in the hospital.  Blood pressures have normalized.  Resume antihypertensives at discharge.  Hyperlipidemia  Statins to continue  COPD  No clinical bronchospasm.    Primary lung cancer with metastasis Discussed with Dr. Benay Spice, Oncology.  Patient was supposed to undergo staging CTs next week which were performed here today and overall show improvement in his Cancer disease.  Patient has follow-up appointment next week.  Hypokalemia/hypomagnesemia  Replaced.  Thrombocytopenia  Likely related to cancer chemotherapy.  No bleeding.  Continues to improve and almost back to normal.  Normocytic anemia  Suspect multifactorial due to hemodilution, cancer chemotherapy and acute infectious illness.  Stable.    Consultants:  Medical  oncology  Procedures:  None  Discharge Instructions  Discharge Instructions    Call MD for:  difficulty breathing, headache or visual disturbances   Complete by: As directed    Call MD for:  extreme fatigue   Complete by: As directed    Call MD for:  persistant dizziness or light-headedness   Complete by: As directed    Call MD for:  persistant nausea and vomiting   Complete by: As directed    Call MD for:  severe uncontrolled pain   Complete by: As directed    Call MD for:  temperature >100.4   Complete by: As directed    Diet - low sodium heart healthy   Complete by: As directed    Increase activity slowly   Complete by: As directed        Medication List    TAKE these medications   amLODipine 10 MG tablet Commonly known as: NORVASC Take 10 mg by mouth daily.   aspirin EC 81 MG tablet Take 81 mg by mouth daily.   ciprofloxacin 500 MG tablet Commonly known as: Cipro Take 1 tablet (500 mg total) by mouth 2 (two) times daily for 5 days.   folic acid 1 MG tablet Commonly known as: FOLVITE Take 1 tablet (1 mg total) by mouth daily.   furosemide 20 MG tablet Commonly known as: LASIX Take 20 mg by mouth daily.   lisinopril 40 MG tablet Commonly known as: ZESTRIL Take 40 mg by mouth daily.   PRESERVISION AREDS PO Take 1 tablet by mouth 2 (two) times a day.   prochlorperazine 5 MG tablet Commonly known as: COMPAZINE Take 1 tablet (5 mg total) by mouth every 6 (six) hours as needed for nausea or vomiting.   SAW PALMETTO PO Take 1 capsule by mouth 2 (two) times a day.   simvastatin 40 MG tablet Commonly known as: ZOCOR Take 40 mg by mouth daily at 6 PM.   vitamin B-12 100 MCG tablet Commonly known as: CYANOCOBALAMIN Take 100 mcg by mouth daily.      Follow-up Information    Ladell Pier, MD Follow up on 01/01/2019.   Specialty: Oncology Why: Please keep prior follow-up with labs. Contact information: [place]  [address] [phone]        Mayra Neer, MD. Schedule an appointment as soon as possible for a visit.   Specialty: Family Medicine Contact information: [address] [place] [address] [phone]          No Known Allergies    Procedures/Studies: Dg Chest 2 View  Result Date: 12/24/2018 CLINICAL DATA:  73 year old male being treated for pancreatic cancer. Weakness. EXAM: CHEST - 2 VIEW COMPARISON:  CT Abdomen and Pelvis 09/22/2018 and earlier. FINDINGS: Semi upright AP and lateral views of the chest. Mildly lower lung volumes. Stable cardiac size and mediastinal contours. Visualized tracheal air column is within normal limits. Stable basilar predominant increased interstitial markings, emphysema noted on the prior CT. Bilateral nipple shadows suspected. No pneumothorax, pleural effusion or acute pulmonary opacity. Stable visualized osseous structures. IMPRESSION: 1. No acute cardiopulmonary abnormality. 2.  Emphysema (ICD10-J43.9). Electronically Signed   By: Genevie Ann M.D.   On: 12/24/2018 18:10   Ct Chest W Contrast  Result Date: 12/26/2018 CLINICAL DATA:  Non-small-cell lung cancer presenting with a pancreatic mass. Liver  lesions. EXAM: CT CHEST, ABDOMEN, AND PELVIS WITH CONTRAST TECHNIQUE: Multidetector CT imaging of the chest, abdomen and pelvis was performed following the standard protocol during bolus administration of intravenous contrast. CONTRAST:  162m OMNIPAQUE IOHEXOL 300 MG/ML  SOLN COMPARISON:  Most recent chest CT of 09/22/2018. Most recent abdominopelvic CT of 07/31/2016. Abdominal or I of 09/08/2018 is also reviewed. FINDINGS: CT CHEST FINDINGS Cardiovascular: Aortic and branch vessel atherosclerosis. Tortuous thoracic aorta. Normal heart size, without pericardial effusion. Multivessel coronary artery atherosclerosis. No central pulmonary embolism, on this non-dedicated study. Pulmonary artery enlargement, outflow tract 3.1 cm. The right pulmonary  artery is 3.6 cm. Mediastinum/Nodes: No supraclavicular adenopathy. Nonspecific subcentimeter right thyroid nodule. Resolved right suprahilar/lateral mediastinal 11 mm node. Right infrahilar node measures 8 mm on 34/2 versus 14 mm on the prior. The previously described periesophageal node has resolved. Improved to resolved retrocrural adenopathy, with the largest node measuring 8 mm today versus 15 mm on the prior. Lungs/Pleura: Trace right pleural fluid. Moderate to marked bullous type emphysema. New right lower lobe dependent airspace disease, most consistent with atelectasis. Improvement in superior segment right lower lobe linear opacities, favored to represent improving atelectasis. Example image 77/4. Minimal dependent left lower lobe atelectasis is also new. Tiny bilateral pulmonary nodules, including along the right minor fissure at 6 mm on image 101/4, similar. Musculoskeletal: Healing of a posterolateral left ninth rib fracture. The adjacent tenth posterolateral left rib fracture as not significantly healed. CT ABDOMEN PELVIS FINDINGS Hepatobiliary: Improved hepatic metastasis compared to 09/22/2018 CT. index high left hepatic lobe lesion measures 2.9 cm on image 54/2 versus 4.4 cm on the prior exam (when remeasured). A high left hepatic lobe 1.3 cm lesion on 51/2 measured 2.8 cm on 09/22/2018 (when remeasured). Mild to moderate gallbladder wall thickening at 7 mm on 71/2. No pericholecystic edema, calcified stone, or biliary duct dilatation. There is pneumobilia with a common duct stent in place. Pancreas: Normal, without mass or ductal dilatation. Spleen: Normal in size, without focal abnormality. Adrenals/Urinary Tract: Normal adrenal glands. Normal kidneys, without hydronephrosis. Normal urinary bladder. Stomach/Bowel: Portions of the proximal stomach and gastric antrum are underdistended. Scattered colonic diverticula. The cecum is redundant, positioned in the right paramidline of the abdomen. Normal  terminal ileum. Normal small bowel. Vascular/Lymphatic: Advanced aortic and branch vessel atherosclerosis. Nonaneurysmal dilatation of the infrarenal aorta at 2.8 cm. Resolved periportal adenopathy. For example, a portal caval node measures 8 mm on 63/2 versus 1.7 cm on the prior exam. The abdominal retroperitoneal adenopathy has resolved since 09/08/2018 MRI. No pelvic sidewall adenopathy. Reproductive: Mild prostatomegaly. Other: No significant free fluid. No evidence of omental or peritoneal disease. Musculoskeletal: Degenerative partial fusion of the right sacroiliac joint. Somewhat heterogeneous appearance of the marrow space, especially at L5. Felt to be similar to 07/31/2016, likely related to heterogeneous osteopenia. IMPRESSION: 1. Marked response to therapy of thoracoabdominal adenopathy. 2. Moderate response to therapy of hepatic metastasis. 3. No new or progressive disease. 4. New trace right pleural fluid with mild bibasilar atelectasis. 5. Aortic atherosclerosis (ICD10-I70.0), coronary artery atherosclerosis and emphysema (ICD10-J43.9). 6. Pulmonary artery enlargement suggests pulmonary arterial hypertension. 7. Biliary stent in place, without evidence of biliary duct dilatation. 8. Nonspecific gallbladder wall thickening. Correlate with right upper quadrant symptoms. Electronically Signed   By: KAbigail MiyamotoM.D.   On: 12/26/2018 14:47   Ct Abdomen Pelvis W Contrast  Result Date: 12/26/2018 CLINICAL DATA:  Non-small-cell lung cancer presenting with a pancreatic mass. Liver lesions. EXAM: CT CHEST, ABDOMEN, AND PELVIS  WITH CONTRAST TECHNIQUE: Multidetector CT imaging of the chest, abdomen and pelvis was performed following the standard protocol during bolus administration of intravenous contrast. CONTRAST:  154m OMNIPAQUE IOHEXOL 300 MG/ML  SOLN COMPARISON:  Most recent chest CT of 09/22/2018. Most recent abdominopelvic CT of 07/31/2016. Abdominal or I of 09/08/2018 is also reviewed. FINDINGS: CT  CHEST FINDINGS Cardiovascular: Aortic and branch vessel atherosclerosis. Tortuous thoracic aorta. Normal heart size, without pericardial effusion. Multivessel coronary artery atherosclerosis. No central pulmonary embolism, on this non-dedicated study. Pulmonary artery enlargement, outflow tract 3.1 cm. The right pulmonary artery is 3.6 cm. Mediastinum/Nodes: No supraclavicular adenopathy. Nonspecific subcentimeter right thyroid nodule. Resolved right suprahilar/lateral mediastinal 11 mm node. Right infrahilar node measures 8 mm on 34/2 versus 14 mm on the prior. The previously described periesophageal node has resolved. Improved to resolved retrocrural adenopathy, with the largest node measuring 8 mm today versus 15 mm on the prior. Lungs/Pleura: Trace right pleural fluid. Moderate to marked bullous type emphysema. New right lower lobe dependent airspace disease, most consistent with atelectasis. Improvement in superior segment right lower lobe linear opacities, favored to represent improving atelectasis. Example image 77/4. Minimal dependent left lower lobe atelectasis is also new. Tiny bilateral pulmonary nodules, including along the right minor fissure at 6 mm on image 101/4, similar. Musculoskeletal: Healing of a posterolateral left ninth rib fracture. The adjacent tenth posterolateral left rib fracture as not significantly healed. CT ABDOMEN PELVIS FINDINGS Hepatobiliary: Improved hepatic metastasis compared to 09/22/2018 CT. index high left hepatic lobe lesion measures 2.9 cm on image 54/2 versus 4.4 cm on the prior exam (when remeasured). A high left hepatic lobe 1.3 cm lesion on 51/2 measured 2.8 cm on 09/22/2018 (when remeasured). Mild to moderate gallbladder wall thickening at 7 mm on 71/2. No pericholecystic edema, calcified stone, or biliary duct dilatation. There is pneumobilia with a common duct stent in place. Pancreas: Normal, without mass or ductal dilatation. Spleen: Normal in size, without focal  abnormality. Adrenals/Urinary Tract: Normal adrenal glands. Normal kidneys, without hydronephrosis. Normal urinary bladder. Stomach/Bowel: Portions of the proximal stomach and gastric antrum are underdistended. Scattered colonic diverticula. The cecum is redundant, positioned in the right paramidline of the abdomen. Normal terminal ileum. Normal small bowel. Vascular/Lymphatic: Advanced aortic and branch vessel atherosclerosis. Nonaneurysmal dilatation of the infrarenal aorta at 2.8 cm. Resolved periportal adenopathy. For example, a portal caval node measures 8 mm on 63/2 versus 1.7 cm on the prior exam. The abdominal retroperitoneal adenopathy has resolved since 09/08/2018 MRI. No pelvic sidewall adenopathy. Reproductive: Mild prostatomegaly. Other: No significant free fluid. No evidence of omental or peritoneal disease. Musculoskeletal: Degenerative partial fusion of the right sacroiliac joint. Somewhat heterogeneous appearance of the marrow space, especially at L5. Felt to be similar to 07/31/2016, likely related to heterogeneous osteopenia. IMPRESSION: 1. Marked response to therapy of thoracoabdominal adenopathy. 2. Moderate response to therapy of hepatic metastasis. 3. No new or progressive disease. 4. New trace right pleural fluid with mild bibasilar atelectasis. 5. Aortic atherosclerosis (ICD10-I70.0), coronary artery atherosclerosis and emphysema (ICD10-J43.9). 6. Pulmonary artery enlargement suggests pulmonary arterial hypertension. 7. Biliary stent in place, without evidence of biliary duct dilatation. 8. Nonspecific gallbladder wall thickening. Correlate with right upper quadrant symptoms. Electronically Signed   By: KAbigail MiyamotoM.D.   On: 12/26/2018 14:47   Dg Chest Port 1 View  Result Date: 12/25/2018 CLINICAL DATA:  History of lung carcinoma with fever and chills EXAM: PORTABLE CHEST 1 VIEW COMPARISON:  12/24/2018 FINDINGS: Cardiac shadow is stable. Aortic calcifications  are noted. Old rib  fractures are noted on the left involving the left ninth rib posteriorly. No focal infiltrate is seen. No other focal abnormality is noted. IMPRESSION: No acute abnormality seen. Electronically Signed   By: Inez Catalina M.D.   On: 12/25/2018 15:04   Vas [place] Lower Extremity Venous (dvt)  Result Date: 12/12/2018  Lower Venous Study Indications: Edema.  Risk Factors: Lung cancer on chemo. Comparison Study: no prior Performing Technologist: June Leap RDMS, RVT  Examination Guidelines: A complete evaluation includes B-mode imaging, spectral Doppler, color Doppler, and power Doppler as needed of all accessible portions of each vessel. Bilateral testing is considered an integral part of a complete examination. Limited examinations for reoccurring indications may be performed as noted.  +---------+---------------+---------+-----------+----------+-------+ RIGHT    CompressibilityPhasicitySpontaneityPropertiesSummary +---------+---------------+---------+-----------+----------+-------+ CFV      Full           Yes      Yes                          +---------+---------------+---------+-----------+----------+-------+ SFJ      Full                                                 +---------+---------------+---------+-----------+----------+-------+ FV Prox  Full                                                 +---------+---------------+---------+-----------+----------+-------+ FV Mid   Full                                                 +---------+---------------+---------+-----------+----------+-------+ FV DistalFull                                                 +---------+---------------+---------+-----------+----------+-------+ PFV      Full                                                 +---------+---------------+---------+-----------+----------+-------+ POP      Full           Yes      Yes                           +---------+---------------+---------+-----------+----------+-------+ PTV      Full                                                 +---------+---------------+---------+-----------+----------+-------+ PERO     Full                                                 +---------+---------------+---------+-----------+----------+-------+   +----+---------------+---------+-----------+----------+-------+  LEFTCompressibilityPhasicitySpontaneityPropertiesSummary +----+---------------+---------+-----------+----------+-------+ CFV Full           Yes      Yes                          +----+---------------+---------+-----------+----------+-------+     Summary: Right: There is no evidence of deep vein thrombosis in the lower extremity. A cystic structure is found in the popliteal fossa. Left: No evidence of common femoral vein obstruction.  *See table(s) above for measurements and observations. Electronically signed by Monica Martinez MD on 12/12/2018 at 4:51:23 PM.    Final       Subjective: Patient denies complaints.  Just met with his oncologist this morning.  No fever or any other complaints.  Very anxious to be discharged home.  Discharge Exam:  Vitals:   12/25/18 0622 12/25/18 1627 12/25/18 2058 12/26/18 0336  BP: 107/60 120/76 118/71 (!) 113/47  Pulse: 65 66 78 67  Resp: _0 Temp: 98 F (36.7 C) 98.3 F (36.8 C) 98.7 F (37.1 C) 98.2 F (36.8 C)  TempSrc: Oral Oral Oral Oral  SpO2: 93% 93% 92% 92%  Weight:      Height:        General exam: Pleasant elderly male, moderately built and nourished lying comfortably propped up in bed without distress.  Oral mucosa moist. Respiratory system: Clear to auscultation. Respiratory effort normal. Cardiovascular system: S1 & S2 heard, RRR. No JVD, murmurs, rubs, gallops or clicks. No pedal edema.   Gastrointestinal system: Abdomen is nondistended, soft and nontender. No organomegaly or masses felt. Normal bowel sounds  heard. Central nervous system: Alert and oriented. No focal neurological deficits. Extremities: Symmetric 5 x 5 power. Skin: No rashes, lesions or ulcers Psychiatry: Judgement and insight appear normal. Mood & affect appropriate.     The results of significant diagnostics from this hospitalization (including imaging, microbiology, ancillary and laboratory) are listed below for reference.     Microbiology: Recent Results (from the past 240 hour(s))  Culture, blood (Routine x 2)     Status: None (Preliminary result)   Collection Time: 12/24/18  5:19 PM   Specimen: BLOOD LEFT FOREARM  Result Value Ref Range Status   Specimen Description   Final    BLOOD LEFT FOREARM Performed at [facility] [address] [place], [place] [address]    Special Requests   Final    BOTTLES DRAWN AEROBIC AND ANAEROBIC Blood Culture adequate volume Performed at [facility] [address] [place], [place] [address]    Culture   Final    NO GROWTH 2 DAYS Performed at [facility] [address] [place], [place] [address]    Report Status PENDING  Incomplete  Culture, blood (Routine x 2)     Status: None (Preliminary result)   Collection Time: 12/24/18  5:19 PM   Specimen: BLOOD  Result Value Ref Range Status   Specimen Description   Final    BLOOD LEFT WRIST Performed at [facility] [address] [place], [place] [address]    Special Requests   Final    BOTTLES DRAWN AEROBIC AND ANAEROBIC Blood Culture results may not be optimal due to an excessive volume of blood received in culture bottles Performed at [facility] [address] [place], [place] [address]    Culture   Final    NO GROWTH 2 DAYS Performed at [facility], [place] [address], [place], [place] [address]    Report Status PENDING  Incomplete  SARS Coronavirus 2 (CEPHEID- Performed in [place] hospital lab), Hosp Order     Status:  None   Collection Time: 12/24/18  5:26 PM   Specimen: Nasopharyngeal Swab  Result Value Ref Range Status   SARS Coronavirus 2 NEGATIVE NEGATIVE Final    Comment: (NOTE) If result is NEGATIVE SARS-CoV-2 target nucleic acids are NOT DETECTED. The SARS-CoV-2 RNA is generally detectable in upper and lower  respiratory specimens during the acute phase of infection. The lowest  concentration of SARS-CoV-2 viral copies this assay can detect is 250  copies / mL. A negative result does not preclude SARS-CoV-2 infection  and should not be used as the sole basis for treatment or other  patient management decisions.  A negative result may occur with  improper specimen collection / handling, submission of specimen other  than nasopharyngeal swab, presence of viral mutation(s) within the  areas targeted by this assay, and inadequate number of viral copies  (<250 copies / mL). A negative result must be combined with clinical  observations, patient history, and epidemiological information. If result is POSITIVE SARS-CoV-2 target nucleic acids are DETECTED. The SARS-CoV-2 RNA is generally detectable in upper and lower  respiratory specimens dur ing the acute phase of infection.  Positive  results are indicative of active infection with SARS-CoV-2.  Clinical  correlation with patient history and other diagnostic information is  necessary to determine patient infection status.  Positive results do  not rule out bacterial infection or co-infection with other viruses. If result is PRESUMPTIVE POSTIVE SARS-CoV-2 nucleic acids MAY BE PRESENT.   A presumptive positive result was obtained on the submitted specimen  and confirmed on repeat testing.  While 2019 novel coronavirus  (SARS-CoV-2) nucleic acids may be present in the submitted sample  additional confirmatory testing may be necessary for epidemiological  and / or clinical management purposes  to differentiate between  SARS-CoV-2 and other  Sarbecovirus currently known to infect humans.  If clinically indicated additional testing with an alternate test  methodology [phone]) is advised. The SARS-CoV-2 RNA is generally  detectable in upper and lower respiratory sp ecimens during the acute  phase of infection. The expected result is Negative. Fact Sheet for Patients:  StrictlyIdeas.no Fact Sheet for Healthcare Providers: BankingDealers.co.za This test is not yet approved or cleared by the [place] FDA and has been authorized for detection and/or diagnosis of SARS-CoV-2 by FDA under an Emergency Use Authorization (EUA).  This EUA will remain in effect (meaning this test can be used) for the duration of the COVID-19 declaration under Section 564(b)(1) of the Act, 21 U.S.C. section 360bbb-3(b)(1), unless the authorization is terminated or revoked sooner. Performed at [facility], [place] [address] [place], [place] [address]   Novel Coronavirus,NAA,(SEND-OUT TO REF LAB - TAT 24-48 hrs); Hosp Order     Status: None   Collection Time: 12/24/18  7:45 PM   Specimen: Urine, Unspecified Source; Respiratory  Result Value Ref Range Status   SARS-CoV-2, NAA NOT DETECTED NOT DETECTED Final    Comment: (NOTE) This test was developed and its performance characteristics determined by [employer]. This test has not been FDA cleared or approved. This test has been authorized by FDA under an Emergency Use Authorization (EUA). This test is only authorized for the duration of time the declaration that circumstances exist justifying the authorization of the emergency use of in  vitro diagnostic tests for detection of SARS-CoV-2 virus and/or diagnosis of COVID-19 infection under section 564(b)(1) of the Act, 21 U.S.C. 984KJI-3(X)(2), unless the authorization is terminated or revoked sooner. When diagnostic testing is negative, the possibility of a false negative  result should be considered in the context of a patient's recent exposures and the presence of clinical signs and symptoms consistent with COVID-19. An individual without symptoms of COVID-19 and who is not shedding SARS-CoV-2 virus would expect to have a negative (not detected) result in this assay. Performed  At: [facility] [place], [place] [id number] Rush Farmer MD PV:[id number]    [place]  Final    Comment: Performed at [address] [place], [place] [address]     Labs: CBC: Recent Labs  Lab 12/24/18 1720 12/25/18 0503 12/26/18 0402  WBC 7.5 6.5 6.6  NEUTROABS 5.9 4.7 4.3  HGB 14.2 11.8* 11.7*  HCT 40.4 35.4* 34.1*  MCV 100.0 102.6* 102.7*  PLT 131* 124* 343*   Basic Metabolic Panel: Recent Labs  Lab 12/24/18 1720 12/25/18 0503 12/26/18 0402  NA 128* 133* 131*  K 3.5 3.2* 3.6  CL 92* 101 99  CO2 _0 GLUCOSE 135* 122* 104*  BUN _1 CREATININE 0.64 0.52* 0.45*  CALCIUM 8.3* 7.9* 7.7*  MG  --  1.5* 1.7   Liver Function Tests: Recent Labs  Lab 12/24/18 1720 12/25/18 0503  AST 32 38  ALT 39 32  ALKPHOS 94 67  BILITOT 0.6 0.5  PROT 6.9 5.5*  ALBUMIN 3.8 3.0*   Urinalysis    Component Value Date/Time   COLORURINE YELLOW 12/24/2018 1655   APPEARANCEUR HAZY (A) 12/24/2018 1655   LABSPEC 1.021 12/24/2018 1655   PHURINE 5.0 12/24/2018 1655   GLUCOSEU NEGATIVE 12/24/2018 1655   HGBUR NEGATIVE 12/24/2018 1655   BILIRUBINUR NEGATIVE 12/24/2018 1655   KETONESUR 5 (A) 12/24/2018 1655   PROTEINUR NEGATIVE 12/24/2018 1655   NITRITE NEGATIVE 12/24/2018 1655   LEUKOCYTESUR NEGATIVE 12/24/2018 1655      Time coordinating discharge: 25 minutes  SIGNED:  Vernell Leep, MD, FACP, [facility]. [facility]  To contact the attending provider between 7A-7P or the covering provider during after hours 7P-7A, please log into the web site [email or web address] and access  using universal [place] password for that web site. If you do not have the password, please call the hospital operator.

## 2018-12-26 NOTE — Progress Notes (Signed)
SATURATION QUALIFICATIONS: (This note is used to comply with regulatory documentation for home oxygen)  Patient Saturations on Room Air at Rest = 78%   Patient Saturations on Room Air while Ambulating = 85  Patient Saturations on 4 Liters of oxygen while Ambulating = 88  Please briefly explain why patient needs home oxygen: Pt desats at rest and while ambulating.

## 2018-12-26 NOTE — Progress Notes (Signed)
IP PROGRESS NOTE  Subjective:   Ricky Conway is known to me with a history of metastatic non-small cell lung cancer, currently being treated with Alimta/carboplatin and pembrolizumab.  He was last treated with chemotherapy on 12/11/2018.  He presented to the emergency room with acute onset of fever and chills on 12/24/2018.  He reports no associated symptoms.  No change in baseline dyspnea.  He continues to smoke.  He was admitted and placed on broad-spectrum intravenous antibiotics.  The fever resolved.   Ricky Conway feels well at present.  He would like to go home.  Objective: Vital signs in last 24 hours: Blood pressure (!) 113/47, pulse 67, temperature 98.2 F (36.8 C), temperature source Oral, resp. rate 18, height 5' 10.5" (1.791 m), weight 167 lb (75.8 kg), SpO2 92 %.  Intake/Output from previous day: 07/02 0701 - 07/03 0700 In: 2765.5 [P.O.:360; I.V.:1405.4; IV Piggyback:1000.1] Out: -   Physical Exam:  HEENT: No thrush Lungs: Distant breath sounds, no respiratory distress Cardiac: Distant heart sounds, regular rhythm Abdomen: No hepatosplenomegaly, no mass, nontender Extremities: No leg edema    Lab Results: Recent Labs    12/25/18 0503 12/26/18 0402  WBC 6.5 6.6  HGB 11.8* 11.7*  HCT 35.4* 34.1*  PLT 124* 144*    BMET Recent Labs    12/25/18 0503 12/26/18 0402  NA 133* 131*  K 3.2* 3.6  CL 101 99  CO2 23 23  GLUCOSE 122* 104*  BUN 15 8  CREATININE 0.52* 0.45*  CALCIUM 7.9* 7.7*    Lab Results  Component Value Date   CEA1 4,350.0 (H) 09/08/2018    Studies/Results: Dg Chest 2 View  Result Date: 12/24/2018 CLINICAL DATA:  73 year old male being treated for pancreatic cancer. Weakness. EXAM: CHEST - 2 VIEW COMPARISON:  CT Abdomen and Pelvis 09/22/2018 and earlier. FINDINGS: Semi upright AP and lateral views of the chest. Mildly lower lung volumes. Stable cardiac size and mediastinal contours. Visualized tracheal air column is within normal limits.  Stable basilar predominant increased interstitial markings, emphysema noted on the prior CT. Bilateral nipple shadows suspected. No pneumothorax, pleural effusion or acute pulmonary opacity. Stable visualized osseous structures. IMPRESSION: 1. No acute cardiopulmonary abnormality. 2.  Emphysema (ICD10-J43.9). Electronically Signed   By: Genevie Ann M.D.   On: 12/24/2018 18:10   Dg Chest Port 1 View  Result Date: 12/25/2018 CLINICAL DATA:  History of lung carcinoma with fever and chills EXAM: PORTABLE CHEST 1 VIEW COMPARISON:  12/24/2018 FINDINGS: Cardiac shadow is stable. Aortic calcifications are noted. Old rib fractures are noted on the left involving the left ninth rib posteriorly. No focal infiltrate is seen. No other focal abnormality is noted. IMPRESSION: No acute abnormality seen. Electronically Signed   By: Inez Catalina M.D.   On: 12/25/2018 15:04    Medications: I have reviewed the patient's current medications.  Assessment/Plan: 1. Non-small cell lung cancer presenting with a "pancreas mass ", liver lesions  Abdominal ultrasound 09/03/2018-3 liver lesions. Intra-and extrahepatic biliary ductal dilatation.  Presentation to the emergency department 09/08/2018 with painless jaundice. Initial bilirubin returned at 18.3.   MRI abdomen 09/08/2018-1.9 x 2.6 cm hypoenhancing lesion arising exophytically along the posterior aspect of the pancreatic head suspicious for pancreatic adenocarcinoma. There was suspected involvement of the distal common duct with secondary intrahepatic/extrahepatic ductal dilatation. Lesion noted to abut the IVC. Multiple liver metastases noted. Small upper abdominal/retroperitoneal lymph nodes noted.   ERCP 09/09/2018-high-grade stricture and obstruction of the distal common bile duct. A metallic biliary  stent was placed. Brushings in the mid and lower third of the main bile duct were obtained with no malignant cells identified.   Bilirubin improved at 4.7 on  09/10/2018.   Biopsy of a liver lesion on 09/11/2018-metastatic carcinoma, cytokeratin 7 and TTF-1 positive, negative for cytokeratin 20, CDX 2, and PSA.  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 COPD 4. Hypertension 5. Trace edema right leg 12/11/2018.  Referred for Doppler. 6. Watery eyes, runny nose reported 12/11/2018.  Question related to Alimta. 7. Admission 12/24/2018 with a high fever-no source for infection identified   Ricky Conway appears well this morning.  He was admitted with a high fever.  No source for infection has been identified.  He is not neutropenic.  The most likely source for infection is the biliary stent, but the liver enzymes are not elevated.  I think it is reasonable to discharge him with a course of ciprofloxacin if cultures remain negative today.  He has completed 4 cycles of systemic therapy for treatment of non-small cell lung cancer.  He is due to undergo restaging CTs next week.  He would like to complete the CT scans while he is here.  I will order the CTs to be performed today.  He will follow-up as scheduled at the Cancer center on 01/01/2019.   LOS: 2 days   [name], MD   12/26/2018, 8:36 AM

## 2018-12-26 NOTE — Progress Notes (Signed)
Pt ALL Covid test negative---spoke with IP Cherrie, RN and MD made aware, PPE discontinued. SRP, RN

## 2018-12-26 NOTE — Progress Notes (Signed)
Pt discharged to home, wife picked up patient. Pt declines home oxygen. Instruction reviewed with patient, including prescription pickup. Pt acknowledged understanding. SRP, RN

## 2018-12-26 NOTE — Discharge Instructions (Signed)

## 2018-12-26 NOTE — Progress Notes (Signed)
Pt states that he has [employer] Oxygen at home and will not need Oxygen to go home, that he has been living with this for a long time, years. Pt continued to state I don't want it and don't need it(oxygen). Pt was very pleasant in making this statement. MD made aware.

## 2018-12-26 NOTE — Progress Notes (Signed)
Sar Coronavirus II and Novel Coronavirus resulted Negative. SRP, RN

## 2018-12-26 NOTE — Plan of Care (Signed)

## 2018-12-26 NOTE — Progress Notes (Signed)
Pt prepared for ambulation, pt O2 sa t@ rest 78% on RA then place O2 @ 4 liters sats 88%. Pt ambulated in hall 100 ft sat on O2 4 liters 88-89 %. Returned and @rest  maintained 88-89% on 4 liters. Pt states he uses his wife O2 at home as needed. He stated he purchased O2 concentrators for home use. Pt states he does not want additional equipment if required, he will continue with his current practice at home. "I will do it my way".  Made MD aware. SRP, RN

## 2018-12-28 ENCOUNTER — Other Ambulatory Visit: Payer: Self-pay | Admitting: Oncology

## 2018-12-29 ENCOUNTER — Ambulatory Visit (HOSPITAL_COMMUNITY): Admission: RE | Admit: 2018-12-29 | Payer: 59 | Source: Ambulatory Visit

## 2018-12-29 ENCOUNTER — Other Ambulatory Visit: Payer: Self-pay | Admitting: Nurse Practitioner

## 2018-12-29 DIAGNOSIS — K8689 Other specified diseases of pancreas: Secondary | ICD-10-CM

## 2018-12-29 LAB — CULTURE, BLOOD (ROUTINE X 2)
Culture: NO GROWTH
Culture: NO GROWTH
Special Requests: ADEQUATE

## 2019-01-01 ENCOUNTER — Inpatient Hospital Stay: Payer: 59 | Attending: Oncology

## 2019-01-01 ENCOUNTER — Inpatient Hospital Stay: Payer: 59

## 2019-01-01 ENCOUNTER — Inpatient Hospital Stay (HOSPITAL_BASED_OUTPATIENT_CLINIC_OR_DEPARTMENT_OTHER): Payer: 59 | Admitting: Oncology

## 2019-01-01 ENCOUNTER — Other Ambulatory Visit: Payer: Self-pay

## 2019-01-01 VITALS — BP 125/62 | HR 88 | Temp 98.7°F | Resp 18 | Ht 70.5 in | Wt 168.7 lb

## 2019-01-01 DIAGNOSIS — K831 Obstruction of bile duct: Secondary | ICD-10-CM

## 2019-01-01 DIAGNOSIS — K5900 Constipation, unspecified: Secondary | ICD-10-CM

## 2019-01-01 DIAGNOSIS — Z5111 Encounter for antineoplastic chemotherapy: Secondary | ICD-10-CM | POA: Insufficient documentation

## 2019-01-01 DIAGNOSIS — C3431 Malignant neoplasm of lower lobe, right bronchus or lung: Secondary | ICD-10-CM | POA: Insufficient documentation

## 2019-01-01 DIAGNOSIS — C3490 Malignant neoplasm of unspecified part of unspecified bronchus or lung: Secondary | ICD-10-CM

## 2019-01-01 DIAGNOSIS — Z5112 Encounter for antineoplastic immunotherapy: Secondary | ICD-10-CM | POA: Insufficient documentation

## 2019-01-01 DIAGNOSIS — C787 Secondary malignant neoplasm of liver and intrahepatic bile duct: Secondary | ICD-10-CM | POA: Insufficient documentation

## 2019-01-01 DIAGNOSIS — Z79899 Other long term (current) drug therapy: Secondary | ICD-10-CM

## 2019-01-01 DIAGNOSIS — I10 Essential (primary) hypertension: Secondary | ICD-10-CM

## 2019-01-01 DIAGNOSIS — J449 Chronic obstructive pulmonary disease, unspecified: Secondary | ICD-10-CM

## 2019-01-01 LAB — CBC WITH DIFFERENTIAL (CANCER CENTER ONLY)
Abs Immature Granulocytes: 0.11 10*3/uL — ABNORMAL HIGH (ref 0.00–0.07)
Basophils Absolute: 0.1 10*3/uL (ref 0.0–0.1)
Basophils Relative: 1 %
Eosinophils Absolute: 0.1 10*3/uL (ref 0.0–0.5)
Eosinophils Relative: 2 %
HCT: 42.8 % (ref 39.0–52.0)
Hemoglobin: 14.7 g/dL (ref 13.0–17.0)
Immature Granulocytes: 2 %
Lymphocytes Relative: 19 %
Lymphs Abs: 1.1 10*3/uL (ref 0.7–4.0)
MCH: 34.8 pg — ABNORMAL HIGH (ref 26.0–34.0)
MCHC: 34.3 g/dL (ref 30.0–36.0)
MCV: 101.4 fL — ABNORMAL HIGH (ref 80.0–100.0)
Monocytes Absolute: 0.8 10*3/uL (ref 0.1–1.0)
Monocytes Relative: 15 %
Neutro Abs: 3.5 10*3/uL (ref 1.7–7.7)
Neutrophils Relative %: 61 %
Platelet Count: 341 10*3/uL (ref 150–400)
RBC: 4.22 MIL/uL (ref 4.22–5.81)
RDW: 18.3 % — ABNORMAL HIGH (ref 11.5–15.5)
WBC Count: 5.7 10*3/uL (ref 4.0–10.5)
nRBC: 0 % (ref 0.0–0.2)

## 2019-01-01 LAB — CMP (CANCER CENTER ONLY)
ALT: 53 U/L — ABNORMAL HIGH (ref 0–44)
AST: 46 U/L — ABNORMAL HIGH (ref 15–41)
Albumin: 3.5 g/dL (ref 3.5–5.0)
Alkaline Phosphatase: 126 U/L (ref 38–126)
Anion gap: 11 (ref 5–15)
BUN: 8 mg/dL (ref 8–23)
CO2: 26 mmol/L (ref 22–32)
Calcium: 8.2 mg/dL — ABNORMAL LOW (ref 8.9–10.3)
Chloride: 98 mmol/L (ref 98–111)
Creatinine: 0.72 mg/dL (ref 0.61–1.24)
GFR, Est AFR Am: >60 mL/min (ref >60)
GFR, Estimated: >60 mL/min (ref >60)
Glucose, Bld: 121 mg/dL — ABNORMAL HIGH (ref 70–99)
Potassium: 3.8 mmol/L (ref 3.5–5.1)
Sodium: 135 mmol/L (ref 135–145)
Total Bilirubin: 0.5 mg/dL (ref 0.3–1.2)
Total Protein: 7 g/dL (ref 6.5–8.1)

## 2019-01-01 LAB — TSH: TSH: 4.825 u[IU]/mL — ABNORMAL HIGH (ref 0.320–4.118)

## 2019-01-01 MED ORDER — SODIUM CHLORIDE 0.9 % IV SOLN
518.0000 mg/m2 | Freq: Once | INTRAVENOUS | Status: AC
  Administered 2019-01-01: 1000 mg via INTRAVENOUS
  Filled 2019-01-01: qty 40

## 2019-01-01 MED ORDER — SODIUM CHLORIDE 0.9 % IV SOLN
Freq: Once | INTRAVENOUS | Status: AC
  Administered 2019-01-01: 09:00:00 via INTRAVENOUS
  Filled 2019-01-01: qty 250

## 2019-01-01 MED ORDER — PROCHLORPERAZINE MALEATE 10 MG PO TABS
10.0000 mg | ORAL_TABLET | Freq: Once | ORAL | Status: AC
  Administered 2019-01-01: 10 mg via ORAL

## 2019-01-01 MED ORDER — SODIUM CHLORIDE 0.9 % IV SOLN
200.0000 mg | Freq: Once | INTRAVENOUS | Status: AC
  Administered 2019-01-01: 200 mg via INTRAVENOUS
  Filled 2019-01-01: qty 8

## 2019-01-01 MED ORDER — PROCHLORPERAZINE MALEATE 10 MG PO TABS
ORAL_TABLET | ORAL | Status: AC
  Filled 2019-01-01: qty 1

## 2019-01-01 NOTE — Progress Notes (Signed)
Ricky Conway OFFICE PROGRESS NOTE   Diagnosis: Non-small cell lung cancer  INTERVAL HISTORY:   [name] returns as scheduled.  He completed another cycle of systemic therapy on 12/11/2018.  He reports constipation for several days following chemotherapy. He was admitted with a high fever on 12/24/2018.  No associated symptoms.  No source for infection was identified.  Cultures were negative.  He completed an outpatient course of ciprofloxacin.  He feels well at present. Objective:  Vital signs in last 24 hours:  Blood pressure 125/62, pulse 88, temperature 98.7 F (37.1 C), temperature source Oral, resp. rate 18, height 5' 10.5" (1.791 m), weight 168 lb 11.2 oz (76.5 kg), SpO2 91 %.     Resp: Distant breath sounds, no respiratory distress Cardio: Regular rate and rhythm GI: No hepatomegaly, nontender Vascular: Trace pitting edema at the right greater than left ankle.   Lab Results:  Lab Results  Component Value Date   WBC 5.7 01/01/2019   HGB 14.7 01/01/2019   HCT 42.8 01/01/2019   MCV 101.4 (H) 01/01/2019   PLT 341 01/01/2019   NEUTROABS 3.5 01/01/2019    CMP  Lab Results  Component Value Date   NA 135 01/01/2019   K 3.8 01/01/2019   CL 98 01/01/2019   CO2 26 01/01/2019   GLUCOSE 121 (H) 01/01/2019   BUN 8 01/01/2019   CREATININE 0.72 01/01/2019   CALCIUM 8.2 (L) 01/01/2019   PROT 7.0 01/01/2019   ALBUMIN 3.5 01/01/2019   AST 46 (H) 01/01/2019   ALT 53 (H) 01/01/2019   ALKPHOS 126 01/01/2019   BILITOT 0.5 01/01/2019   GFRNONAA >60 01/01/2019   GFRAA >60 01/01/2019    Lab Results  Component Value Date   CEA1 4,350.0 (H) 09/08/2018     Medications: I have reviewed the patient's current medications.   Assessment/Plan: 1. Pancreas mass, liver lesions  Abdominal ultrasound 09/03/2018-3 liver lesions. Intra-and extrahepatic biliary ductal dilatation.  Presentation to the emergency department 09/08/2018 with painless jaundice. Initial  bilirubin returned at 18.3.   MRI abdomen 09/08/2018-1.9 x 2.6 cm hypoenhancing lesion arising exophytically along the posterior aspect of the pancreatic head suspicious for pancreatic adenocarcinoma. There was suspected involvement of the distal common duct with secondary intrahepatic/extrahepatic ductal dilatation. Lesion noted to abut the IVC. Multiple liver metastases noted. Small upper abdominal/retroperitoneal lymph nodes noted.   ERCP 09/09/2018-high-grade stricture and obstruction of the distal common bile duct. A metallic biliary stent was placed. Brushings in the mid and lower third of the main bile duct were obtained with no malignant cells identified.   Bilirubin improved at 4.7 on 09/10/2018.   Biopsy of a liver lesion on 09/11/2018-metastatic carcinoma, cytokeratin 7 and TTF-1 positive, negative for cytokeratin 20, CDX 2, and PSA.  CT chest 09/22/2018-right hilar/mediastinal/retrocrural lymphadenopathy, ill-defined right lower lobe nodularity, emphysema  Cycle 1 carboplatin/Alimta/pembrolizumab 10/09/2018  Cycle 2 carboplatin/Alimta/pembrolizumab 10/30/2018  Cycle 3 carboplatin/Alimta/pembrolizumab 11/20/2018  Cycle 4 carboplatin/Alimta/pembrolizumab 12/11/2018  CT 12/26/2018- marked response to therapy of the radical abdominal adenopathy, decreased size of hepatic metastases, no new or progressive disease  Cycle 5 Alimta/pembrolizumab 01/01/2019 2. Biliary obstruction status post stent placement 3. COPD 4. Hypertension 5. Trace edema right leg 12/11/2018.  Referred for Doppler. 6. Watery eyes, runny nose reported 12/11/2018.  Question related to Alimta. 7. Admission 12/24/2018 with a fever- no source for infection identified, completed outpatient course of ciprofloxacin     Disposition: [name] appears well.  The restaging CTs confirmed a response to the  systemic therapy.  I reviewed the CT images with [name].  I recommend continuing Alimta/pembrolizumab.  He is in  agreement.  He will complete another cycle of systemic therapy today.  [name] will return for an office visit and chemotherapy in 3 weeks.  No source for infection was identified during the recent hospital admission.  The fever may have been related to transient bacteremia with the biliary stent.  [name], MD  01/01/2019  9:02 AM

## 2019-01-01 NOTE — Patient Instructions (Signed)
Coronavirus (COVID-19) Are you at risk?  Are you at risk for the Coronavirus (COVID-19)?  To be considered HIGH RISK for Coronavirus (COVID-19), you have to meet the following criteria:  . Traveled to [place], [place], [place], [place] or [place]; or in the [place] to [place], [place], [place], or [place]; and have fever, cough, and shortness of breath within the last 2 weeks of travel OR . Been in close contact with a person diagnosed with COVID-19 within the last 2 weeks and have fever, cough, and shortness of breath . IF YOU DO NOT MEET THESE CRITERIA, YOU ARE CONSIDERED LOW RISK FOR COVID-19.  What to do if you are HIGH RISK for COVID-19?  Marland Kitchen If you are having a medical emergency, call 911. . Seek medical care right away. Before you go to a doctor's office, urgent care or emergency department, call ahead and tell them about your recent travel, contact with someone diagnosed with COVID-19, and your symptoms. You should receive instructions from your physician's office regarding next steps of care.  . When you arrive at healthcare provider, tell the healthcare staff immediately you have returned from visiting [place], [place], [place], [place] or [place]; or traveled in the [place] to [place], [place], [place], or [place]; in the last two weeks or you have been in close contact with a person diagnosed with COVID-19 in the last 2 weeks.   . Tell the health care staff about your symptoms: fever, cough and shortness of breath. . After you have been seen by a medical provider, you will be either: o Tested for (COVID-19) and discharged home on quarantine except to seek medical care if symptoms worsen, and asked to  - Stay home and avoid contact with others until you get your results (4-5 days)  - Avoid travel on public transportation if possible (such as bus, train, or airplane) or o Sent to the Emergency Department by EMS for evaluation, COVID-19 testing, and possible  admission depending on your condition and test results.  What to do if you are LOW RISK for COVID-19?  Reduce your risk of any infection by using the same precautions used for avoiding the common cold or flu:  Marland Kitchen Wash your hands often with soap and warm water for at least 20 seconds.  If soap and water are not readily available, use an alcohol-based hand sanitizer with at least 60% alcohol.  . If coughing or sneezing, cover your mouth and nose by coughing or sneezing into the elbow areas of your shirt or coat, into a tissue or into your sleeve (not your hands). . Avoid shaking hands with others and consider head nods or verbal greetings only. . Avoid touching your eyes, nose, or mouth with unwashed hands.  . Avoid close contact with people who are sick. . Avoid places or events with large numbers of people in one location, like concerts or sporting events. . Carefully consider travel plans you have or are making. . If you are planning any travel outside or inside the [place], visit the CDC's Travelers' Health webpage for the latest health notices. . If you have some symptoms but not all symptoms, continue to monitor at home and seek medical attention if your symptoms worsen. . If you are having a medical emergency, call 911.   [place] / e-Visit: [email or web address]         [facility]: [place]  [facility]: [place] [facility]: [place] Discharge Instructions for Patients Receiving Chemotherapy  Today you received the following chemotherapy agents Keytruda and Alimta  To help prevent nausea and vomiting after your treatment, we encourage you to take your nausea medication as directed.    If you develop nausea and vomiting that is not controlled by your nausea medication, call the clinic.    BELOW ARE SYMPTOMS THAT SHOULD BE REPORTED IMMEDIATELY:  *FEVER GREATER THAN 100.5 F  *CHILLS WITH OR WITHOUT FEVER  NAUSEA AND VOMITING THAT IS NOT CONTROLLED WITH YOUR NAUSEA MEDICATION  *UNUSUAL SHORTNESS OF BREATH  *UNUSUAL BRUISING OR BLEEDING  TENDERNESS IN MOUTH AND THROAT WITH OR WITHOUT PRESENCE OF ULCERS  *URINARY PROBLEMS  *BOWEL PROBLEMS  UNUSUAL RASH Items with * indicate a potential emergency and should be followed up as soon as possible.  Feel free to call the clinic should you have any questions or concerns. The clinic phone number is [phone].  Please show the [place] at check-in to the Emergency Department and triage nurse.

## 2019-01-05 ENCOUNTER — Telehealth: Payer: Self-pay | Admitting: Oncology

## 2019-01-05 NOTE — Telephone Encounter (Signed)
Called and spoke with patient. Confirmed dates and times of appts

## 2019-01-18 ENCOUNTER — Other Ambulatory Visit: Payer: Self-pay | Admitting: Oncology

## 2019-01-23 ENCOUNTER — Telehealth: Payer: Self-pay | Admitting: Nurse Practitioner

## 2019-01-23 ENCOUNTER — Other Ambulatory Visit: Payer: Self-pay

## 2019-01-23 ENCOUNTER — Inpatient Hospital Stay (HOSPITAL_BASED_OUTPATIENT_CLINIC_OR_DEPARTMENT_OTHER): Payer: 59 | Admitting: Nurse Practitioner

## 2019-01-23 ENCOUNTER — Encounter: Payer: Self-pay | Admitting: Nurse Practitioner

## 2019-01-23 ENCOUNTER — Inpatient Hospital Stay: Payer: 59

## 2019-01-23 VITALS — BP 122/72 | HR 74 | Temp 98.2°F | Resp 18 | Ht 70.0 in | Wt 170.7 lb

## 2019-01-23 DIAGNOSIS — M7989 Other specified soft tissue disorders: Secondary | ICD-10-CM

## 2019-01-23 DIAGNOSIS — J449 Chronic obstructive pulmonary disease, unspecified: Secondary | ICD-10-CM

## 2019-01-23 DIAGNOSIS — I10 Essential (primary) hypertension: Secondary | ICD-10-CM

## 2019-01-23 DIAGNOSIS — K5900 Constipation, unspecified: Secondary | ICD-10-CM

## 2019-01-23 DIAGNOSIS — C787 Secondary malignant neoplasm of liver and intrahepatic bile duct: Secondary | ICD-10-CM

## 2019-01-23 DIAGNOSIS — Z79899 Other long term (current) drug therapy: Secondary | ICD-10-CM

## 2019-01-23 DIAGNOSIS — C3431 Malignant neoplasm of lower lobe, right bronchus or lung: Secondary | ICD-10-CM

## 2019-01-23 DIAGNOSIS — C3490 Malignant neoplasm of unspecified part of unspecified bronchus or lung: Secondary | ICD-10-CM

## 2019-01-23 LAB — CBC WITH DIFFERENTIAL (CANCER CENTER ONLY)
Abs Immature Granulocytes: 0.04 10*3/uL (ref 0.00–0.07)
Basophils Absolute: 0.1 10*3/uL (ref 0.0–0.1)
Basophils Relative: 1 %
Eosinophils Absolute: 0.3 10*3/uL (ref 0.0–0.5)
Eosinophils Relative: 3 %
HCT: 42.3 % (ref 39.0–52.0)
Hemoglobin: 14.9 g/dL (ref 13.0–17.0)
Immature Granulocytes: 0 %
Lymphocytes Relative: 13 %
Lymphs Abs: 1.3 10*3/uL (ref 0.7–4.0)
MCH: 36.3 pg — ABNORMAL HIGH (ref 26.0–34.0)
MCHC: 35.2 g/dL (ref 30.0–36.0)
MCV: 102.9 fL — ABNORMAL HIGH (ref 80.0–100.0)
Monocytes Absolute: 1.4 10*3/uL — ABNORMAL HIGH (ref 0.1–1.0)
Monocytes Relative: 14 %
Neutro Abs: 7 10*3/uL (ref 1.7–7.7)
Neutrophils Relative %: 69 %
Platelet Count: 301 10*3/uL (ref 150–400)
RBC: 4.11 MIL/uL — ABNORMAL LOW (ref 4.22–5.81)
RDW: 16.2 % — ABNORMAL HIGH (ref 11.5–15.5)
WBC Count: 10.1 10*3/uL (ref 4.0–10.5)
nRBC: 0 % (ref 0.0–0.2)

## 2019-01-23 LAB — CMP (CANCER CENTER ONLY)
ALT: 33 U/L (ref 0–44)
AST: 31 U/L (ref 15–41)
Albumin: 3.6 g/dL (ref 3.5–5.0)
Alkaline Phosphatase: 103 U/L (ref 38–126)
Anion gap: 10 (ref 5–15)
BUN: 12 mg/dL (ref 8–23)
CO2: 26 mmol/L (ref 22–32)
Calcium: 9.2 mg/dL (ref 8.9–10.3)
Chloride: 100 mmol/L (ref 98–111)
Creatinine: 0.69 mg/dL (ref 0.61–1.24)
GFR, Est AFR Am: >60 mL/min (ref >60)
GFR, Estimated: >60 mL/min (ref >60)
Glucose, Bld: 99 mg/dL (ref 70–99)
Potassium: 4.2 mmol/L (ref 3.5–5.1)
Sodium: 136 mmol/L (ref 135–145)
Total Bilirubin: 0.5 mg/dL (ref 0.3–1.2)
Total Protein: 6.9 g/dL (ref 6.5–8.1)

## 2019-01-23 LAB — CEA (IN HOUSE-CHCC): CEA (CHCC-In House): 227.4 ng/mL — ABNORMAL HIGH (ref 0.00–5.00)

## 2019-01-23 MED ORDER — SODIUM CHLORIDE 0.9 % IV SOLN
200.0000 mg | Freq: Once | INTRAVENOUS | Status: AC
  Administered 2019-01-23: 200 mg via INTRAVENOUS
  Filled 2019-01-23: qty 8

## 2019-01-23 MED ORDER — SODIUM CHLORIDE 0.9 % IV SOLN
1000.0000 mg | Freq: Once | INTRAVENOUS | Status: AC
  Administered 2019-01-23: 13:00:00 1000 mg via INTRAVENOUS
  Filled 2019-01-23: qty 40

## 2019-01-23 MED ORDER — CYANOCOBALAMIN 1000 MCG/ML IJ SOLN
INTRAMUSCULAR | Status: AC
  Filled 2019-01-23: qty 1

## 2019-01-23 MED ORDER — CYANOCOBALAMIN 1000 MCG/ML IJ SOLN
1000.0000 ug | Freq: Once | INTRAMUSCULAR | Status: AC
  Administered 2019-01-23: 1000 ug via INTRAMUSCULAR

## 2019-01-23 MED ORDER — PROCHLORPERAZINE MALEATE 10 MG PO TABS
ORAL_TABLET | ORAL | Status: AC
  Filled 2019-01-23: qty 1

## 2019-01-23 MED ORDER — PROCHLORPERAZINE MALEATE 10 MG PO TABS
10.0000 mg | ORAL_TABLET | Freq: Once | ORAL | Status: AC
  Administered 2019-01-23: 10 mg via ORAL

## 2019-01-23 MED ORDER — SODIUM CHLORIDE 0.9 % IV SOLN
Freq: Once | INTRAVENOUS | Status: AC
  Administered 2019-01-23: 12:00:00 via INTRAVENOUS
  Filled 2019-01-23: qty 250

## 2019-01-23 NOTE — Telephone Encounter (Signed)
Scheduled per los. Mailed printout  °

## 2019-01-23 NOTE — Progress Notes (Signed)
[facility] OFFICE PROGRESS NOTE  Ricky Neer, MD [address] [place] [address]  DIAGNOSIS: Non-small cell lung cancer,   CURRENT THERAPY: Carboplatin for an AUC of 5, Alimta 500 mg/m2, and Keytruda 200 mg IV every 3 weeks. Starting from cycle #5 he will receive maintenance Alimta and Keytruda IV every 3 weeks. Status post 5 cycles.   INTERVAL HISTORY: Ricky Conway 73 y.o. male returns to the clinic as scheduled. He was hospitalized from 7/1-7/3 for fever and chills. No source of infection was found. He then returned to the clinic for chemotherapy on 01/01/2019 and tolerated it well except for he developed bilateral lower extremity swelling since his last visit. The swelling is bilateral and is exacerbated by walking. His swelling is improved with elevation of his lower extremities. He states that in the morning when he wakes up that his legs/ankles return to their baseline normal appearance. He denies any recent changes to his medications, particularly his Norvasc dosage recently. He takes lasix 20 mg once a day. He denies any calf pain. He recently had a doppler performed on his lower extremity last month which was negative for a DVT. He denies any rashes. He denies any chest pain, shortness of breath, cough, or hemoptyosis. He denies any fever, chills, night sweats, or weight loss.  He is eating an drinking fluids per his usual diary habits. He denies any nausea, vomiting, or diarrhea. He reports his baseline constipation for which he takes a stool softener 1 tablet BID with Miralax as needed. He achieves a bowel movement every day. He is here today for evaluation before starting cycle #6.    MEDICAL HISTORY: Past Medical History:  Diagnosis Date  . 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 COPD (chronic obstructive pulmonary disease) ([place])   . Hyperlipidemia   . Hypertension     ALLERGIES:  has No Known Allergies.  MEDICATIONS:  Current  Outpatient Medications  Medication Sig Dispense Refill  . amLODipine (NORVASC) 10 MG tablet Take 10 mg by mouth daily.    Marland Kitchen aspirin EC 81 MG tablet Take 81 mg by mouth daily.    . bisacodyl (DULCOLAX) 5 MG EC tablet Take 5 mg by mouth daily as needed for moderate constipation.    . folic acid (FOLVITE) 1 MG tablet TAKE 1 TABLET BY MOUTH EVERY DAY 90 tablet 1  . furosemide (LASIX) 20 MG tablet Take 20 mg by mouth daily.    Marland Kitchen lisinopril (PRINIVIL,ZESTRIL) 40 MG tablet Take 40 mg by mouth daily.    . Multiple Vitamins-Minerals (PRESERVISION AREDS PO) Take 1 tablet by mouth 2 (two) times a day.    . prochlorperazine (COMPAZINE) 5 MG tablet Take 1 tablet (5 mg total) by mouth every 6 (six) hours as needed for nausea or vomiting. 30 tablet 3  . Saw Palmetto, Serenoa repens, (SAW PALMETTO PO) Take 1 capsule by mouth daily.     . simvastatin (ZOCOR) 40 MG tablet Take 20 mg by mouth daily at 6 PM.      No current facility-administered medications for this visit.    Facility-Administered Medications Ordered in Other Visits  Medication Dose Route Frequency Provider Last Rate Last Dose  . pembrolizumab (KEYTRUDA) 200 mg in sodium chloride 0.9 % 50 mL chemo infusion  200 mg Intravenous Once [name] B, MD      . PEMEtrexed (ALIMTA) 1,000 mg in sodium chloride 0.9 % 100 mL chemo infusion  1,000 mg Intravenous Once  Ladell Pier, MD      . sodium chloride flush (NS) 0.9 % injection 10 mL  10 mL Intravenous PRN Ladell Pier, MD        SURGICAL HISTORY:  Past Surgical History:  Procedure Laterality Date  . BILIARY BRUSHING  09/09/2018   Procedure: BILIARY BRUSHING;  Surgeon: Ronnette Juniper, MD;  Location: [facility] ENDOSCOPY;  Service: Gastroenterology;;  . BILIARY STENT PLACEMENT  09/09/2018   Procedure: BILIARY STENT PLACEMENT;  Surgeon: Ronnette Juniper, MD;  Location: [place];  Service: Gastroenterology;;  . ERCP N/A 09/09/2018   Procedure: ENDOSCOPIC RETROGRADE CHOLANGIOPANCREATOGRAPHY (ERCP);   Surgeon: Ronnette Juniper, MD;  Location: [place];  Service: Gastroenterology;  Laterality: N/A;  . FOOT SURGERY    . HERNIA REPAIR     WITH MESH  . REMOVAL OF STONES  09/09/2018   Procedure: REMOVAL OF STONES;  Surgeon: Ronnette Juniper, MD;  Location: [facility] ENDOSCOPY;  Service: Gastroenterology;;  . Joan Mayans  09/09/2018   Procedure: SPHINCTEROTOMY;  Surgeon: Ronnette Juniper, MD;  Location: [facility] ENDOSCOPY;  Service: Gastroenterology;;    REVIEW OF SYSTEMS:   Review of Systems  Constitutional: Negative for appetite change, chills, fatigue, fever and unexpected weight change.  HENT: Negative for mouth sores, nosebleeds, sore throat and trouble swallowing.   Eyes: Negative for eye problems and icterus.  Respiratory: Negative for cough, hemoptysis, shortness of breath and wheezing.   Cardiovascular: Positive for bilateral leg swelling. Negative for chest pain.  Gastrointestinal: Positive for baseline constipation. Negative for abdominal pain, diarrhea, nausea and vomiting.  Genitourinary: Negative for bladder incontinence, difficulty urinating, dysuria, frequency and hematuria.   Musculoskeletal: Negative for back pain, gait problem, neck pain and neck stiffness.  Skin: Negative for itching and rash.  Neurological: Negative for dizziness, extremity weakness, gait problem, headaches, light-headedness and seizures.  Hematological: Negative for adenopathy. Does not bruise/bleed easily.  Psychiatric/Behavioral: Negative for confusion, depression and sleep disturbance. The patient is not nervous/anxious.     PHYSICAL EXAMINATION:  Blood pressure 122/72, pulse 74, temperature 98.2 F (36.8 C), temperature source Oral, resp. rate 18, height 5\' 10"  (1.778 m), weight 170 lb 11.2 oz (77.4 kg), SpO2 90 %.  ECOG PERFORMANCE STATUS: 1 - Symptomatic but completely ambulatory  Physical Exam  Constitutional: Oriented to person, place, and time and well-developed, well-nourished, and in no distress.  HENT:   Head: Normocephalic and atraumatic.  Mouth/Throat: Oropharynx is clear and moist. No oropharyngeal exudate.  Eyes: Conjunctivae are normal. Right eye exhibits no discharge. Left eye exhibits no discharge. No scleral icterus.  Neck: Normal range of motion. Neck supple.  Cardiovascular: Normal rate, regular rhythm, normal heart sounds and intact distal pulses.  No venous distension.  Pulmonary/Chest: Effort normal and breath sounds normal. No respiratory distress. No wheezes. No rales.  Abdominal: Soft. Bowel sounds are normal. Exhibits no distension and no mass. There is no tenderness.  Musculoskeletal: Bilateral lower extremity swelling with mild erythema. Normal range of motion. No calf tenderness.  Lymphadenopathy:    No cervical adenopathy.  Neurological: Alert and oriented to person, place, and time. Exhibits normal muscle tone. Gait normal. Coordination normal.  Skin: Skin is warm and dry. No rash noted. Not diaphoretic. No pallor. No drainage.  Psychiatric: Mood, memory and judgment normal.  Vitals reviewed.  LABORATORY DATA: Lab Results  Component Value Date   WBC 10.1 01/23/2019   HGB 14.9 01/23/2019   HCT 42.3 01/23/2019   MCV 102.9 (H) 01/23/2019   PLT 301 01/23/2019  Chemistry      Component Value Date/Time   NA 136 01/23/2019 1021   K 4.2 01/23/2019 1021   CL 100 01/23/2019 1021   CO2 26 01/23/2019 1021   BUN 12 01/23/2019 1021   CREATININE 0.69 01/23/2019 1021      Component Value Date/Time   CALCIUM 9.2 01/23/2019 1021   ALKPHOS 103 01/23/2019 1021   AST 31 01/23/2019 1021   ALT 33 01/23/2019 1021   BILITOT 0.5 01/23/2019 1021       RADIOGRAPHIC STUDIES:  Dg Chest 2 View  Result Date: 12/24/2018 CLINICAL DATA:  73 year old male being treated for pancreatic cancer. Weakness. EXAM: CHEST - 2 VIEW COMPARISON:  CT Abdomen and Pelvis 09/22/2018 and earlier. FINDINGS: Semi upright AP and lateral views of the chest. Mildly lower lung volumes. Stable  cardiac size and mediastinal contours. Visualized tracheal air column is within normal limits. Stable basilar predominant increased interstitial markings, emphysema noted on the prior CT. Bilateral nipple shadows suspected. No pneumothorax, pleural effusion or acute pulmonary opacity. Stable visualized osseous structures. IMPRESSION: 1. No acute cardiopulmonary abnormality. 2.  Emphysema (ICD10-J43.9). Electronically Signed   By: Genevie Ann M.D.   On: 12/24/2018 18:10   Ct Chest W Contrast  Result Date: 12/26/2018 CLINICAL DATA:  Non-small-cell lung cancer presenting with a pancreatic mass. Liver lesions. EXAM: CT CHEST, ABDOMEN, AND PELVIS WITH CONTRAST TECHNIQUE: Multidetector CT imaging of the chest, abdomen and pelvis was performed following the standard protocol during bolus administration of intravenous contrast. CONTRAST:  176mL OMNIPAQUE IOHEXOL 300 MG/ML  SOLN COMPARISON:  Most recent chest CT of 09/22/2018. Most recent abdominopelvic CT of 07/31/2016. Abdominal or I of 09/08/2018 is also reviewed. FINDINGS: CT CHEST FINDINGS Cardiovascular: Aortic and branch vessel atherosclerosis. Tortuous thoracic aorta. Normal heart size, without pericardial effusion. Multivessel coronary artery atherosclerosis. No central pulmonary embolism, on this non-dedicated study. Pulmonary artery enlargement, outflow tract 3.1 cm. The right pulmonary artery is 3.6 cm. Mediastinum/Nodes: No supraclavicular adenopathy. Nonspecific subcentimeter right thyroid nodule. Resolved right suprahilar/lateral mediastinal 11 mm node. Right infrahilar node measures 8 mm on 34/2 versus 14 mm on the prior. The previously described periesophageal node has resolved. Improved to resolved retrocrural adenopathy, with the largest node measuring 8 mm today versus 15 mm on the prior. Lungs/Pleura: Trace right pleural fluid. Moderate to marked bullous type emphysema. New right lower lobe dependent airspace disease, most consistent with atelectasis.  Improvement in superior segment right lower lobe linear opacities, favored to represent improving atelectasis. Example image 77/4. Minimal dependent left lower lobe atelectasis is also new. Tiny bilateral pulmonary nodules, including along the right minor fissure at 6 mm on image 101/4, similar. Musculoskeletal: Healing of a posterolateral left ninth rib fracture. The adjacent tenth posterolateral left rib fracture as not significantly healed. CT ABDOMEN PELVIS FINDINGS Hepatobiliary: Improved hepatic metastasis compared to 09/22/2018 CT. index high left hepatic lobe lesion measures 2.9 cm on image 54/2 versus 4.4 cm on the prior exam (when remeasured). A high left hepatic lobe 1.3 cm lesion on 51/2 measured 2.8 cm on 09/22/2018 (when remeasured). Mild to moderate gallbladder wall thickening at 7 mm on 71/2. No pericholecystic edema, calcified stone, or biliary duct dilatation. There is pneumobilia with a common duct stent in place. Pancreas: Normal, without mass or ductal dilatation. Spleen: Normal in size, without focal abnormality. Adrenals/Urinary Tract: Normal adrenal glands. Normal kidneys, without hydronephrosis. Normal urinary bladder. Stomach/Bowel: Portions of the proximal stomach and gastric antrum are underdistended. Scattered colonic diverticula. The cecum is  redundant, positioned in the right paramidline of the abdomen. Normal terminal ileum. Normal small bowel. Vascular/Lymphatic: Advanced aortic and branch vessel atherosclerosis. Nonaneurysmal dilatation of the infrarenal aorta at 2.8 cm. Resolved periportal adenopathy. For example, a portal caval node measures 8 mm on 63/2 versus 1.7 cm on the prior exam. The abdominal retroperitoneal adenopathy has resolved since 09/08/2018 MRI. No pelvic sidewall adenopathy. Reproductive: Mild prostatomegaly. Other: No significant free fluid. No evidence of omental or peritoneal disease. Musculoskeletal: Degenerative partial fusion of the right sacroiliac joint.  Somewhat heterogeneous appearance of the marrow space, especially at L5. Felt to be similar to 07/31/2016, likely related to heterogeneous osteopenia. IMPRESSION: 1. Marked response to therapy of thoracoabdominal adenopathy. 2. Moderate response to therapy of hepatic metastasis. 3. No new or progressive disease. 4. New trace right pleural fluid with mild bibasilar atelectasis. 5. Aortic atherosclerosis (ICD10-I70.0), coronary artery atherosclerosis and emphysema (ICD10-J43.9). 6. Pulmonary artery enlargement suggests pulmonary arterial hypertension. 7. Biliary stent in place, without evidence of biliary duct dilatation. 8. Nonspecific gallbladder wall thickening. Correlate with right upper quadrant symptoms. Electronically Signed   By: Abigail Miyamoto M.D.   On: 12/26/2018 14:47   Ct Abdomen Pelvis W Contrast  Result Date: 12/26/2018 CLINICAL DATA:  Non-small-cell lung cancer presenting with a pancreatic mass. Liver lesions. EXAM: CT CHEST, ABDOMEN, AND PELVIS WITH CONTRAST TECHNIQUE: Multidetector CT imaging of the chest, abdomen and pelvis was performed following the standard protocol during bolus administration of intravenous contrast. CONTRAST:  156mL OMNIPAQUE IOHEXOL 300 MG/ML  SOLN COMPARISON:  Most recent chest CT of 09/22/2018. Most recent abdominopelvic CT of 07/31/2016. Abdominal or I of 09/08/2018 is also reviewed. FINDINGS: CT CHEST FINDINGS Cardiovascular: Aortic and branch vessel atherosclerosis. Tortuous thoracic aorta. Normal heart size, without pericardial effusion. Multivessel coronary artery atherosclerosis. No central pulmonary embolism, on this non-dedicated study. Pulmonary artery enlargement, outflow tract 3.1 cm. The right pulmonary artery is 3.6 cm. Mediastinum/Nodes: No supraclavicular adenopathy. Nonspecific subcentimeter right thyroid nodule. Resolved right suprahilar/lateral mediastinal 11 mm node. Right infrahilar node measures 8 mm on 34/2 versus 14 mm on the prior. The previously  described periesophageal node has resolved. Improved to resolved retrocrural adenopathy, with the largest node measuring 8 mm today versus 15 mm on the prior. Lungs/Pleura: Trace right pleural fluid. Moderate to marked bullous type emphysema. New right lower lobe dependent airspace disease, most consistent with atelectasis. Improvement in superior segment right lower lobe linear opacities, favored to represent improving atelectasis. Example image 77/4. Minimal dependent left lower lobe atelectasis is also new. Tiny bilateral pulmonary nodules, including along the right minor fissure at 6 mm on image 101/4, similar. Musculoskeletal: Healing of a posterolateral left ninth rib fracture. The adjacent tenth posterolateral left rib fracture as not significantly healed. CT ABDOMEN PELVIS FINDINGS Hepatobiliary: Improved hepatic metastasis compared to 09/22/2018 CT. index high left hepatic lobe lesion measures 2.9 cm on image 54/2 versus 4.4 cm on the prior exam (when remeasured). A high left hepatic lobe 1.3 cm lesion on 51/2 measured 2.8 cm on 09/22/2018 (when remeasured). Mild to moderate gallbladder wall thickening at 7 mm on 71/2. No pericholecystic edema, calcified stone, or biliary duct dilatation. There is pneumobilia with a common duct stent in place. Pancreas: Normal, without mass or ductal dilatation. Spleen: Normal in size, without focal abnormality. Adrenals/Urinary Tract: Normal adrenal glands. Normal kidneys, without hydronephrosis. Normal urinary bladder. Stomach/Bowel: Portions of the proximal stomach and gastric antrum are underdistended. Scattered colonic diverticula. The cecum is redundant, positioned in the right paramidline of  the abdomen. Normal terminal ileum. Normal small bowel. Vascular/Lymphatic: Advanced aortic and branch vessel atherosclerosis. Nonaneurysmal dilatation of the infrarenal aorta at 2.8 cm. Resolved periportal adenopathy. For example, a portal caval node measures 8 mm on 63/2 versus  1.7 cm on the prior exam. The abdominal retroperitoneal adenopathy has resolved since 09/08/2018 MRI. No pelvic sidewall adenopathy. Reproductive: Mild prostatomegaly. Other: No significant free fluid. No evidence of omental or peritoneal disease. Musculoskeletal: Degenerative partial fusion of the right sacroiliac joint. Somewhat heterogeneous appearance of the marrow space, especially at L5. Felt to be similar to 07/31/2016, likely related to heterogeneous osteopenia. IMPRESSION: 1. Marked response to therapy of thoracoabdominal adenopathy. 2. Moderate response to therapy of hepatic metastasis. 3. No new or progressive disease. 4. New trace right pleural fluid with mild bibasilar atelectasis. 5. Aortic atherosclerosis (ICD10-I70.0), coronary artery atherosclerosis and emphysema (ICD10-J43.9). 6. Pulmonary artery enlargement suggests pulmonary arterial hypertension. 7. Biliary stent in place, without evidence of biliary duct dilatation. 8. Nonspecific gallbladder wall thickening. Correlate with right upper quadrant symptoms. Electronically Signed   By: Abigail Miyamoto M.D.   On: 12/26/2018 14:47   Dg Chest Port 1 View  Result Date: 12/25/2018 CLINICAL DATA:  History of lung carcinoma with fever and chills EXAM: PORTABLE CHEST 1 VIEW COMPARISON:  12/24/2018 FINDINGS: Cardiac shadow is stable. Aortic calcifications are noted. Old rib fractures are noted on the left involving the left ninth rib posteriorly. No focal infiltrate is seen. No other focal abnormality is noted. IMPRESSION: No acute abnormality seen. Electronically Signed   By: Inez Catalina M.D.   On: 12/25/2018 15:04     ASSESSMENT/PLAN:  1. Pancreas mass, liver lesions  Abdominal ultrasound 09/03/2018-3 liver lesions. Intra-and extrahepatic biliary ductal dilatation.  Presentation to the emergency department 09/08/2018 with painless jaundice. Initial bilirubin returned at 18.3.   MRI abdomen 09/08/2018-1.9 x 2.6 cm hypoenhancing lesion arising  exophytically along the posterior aspect of the pancreatic head suspicious for pancreatic adenocarcinoma. There was suspected involvement of the distal common duct with secondary intrahepatic/extrahepatic ductal dilatation. Lesion noted to abut the IVC. Multiple liver metastases noted. Small upper abdominal/retroperitoneal lymph nodes noted.   ERCP 09/09/2018-high-grade stricture and obstruction of the distal common bile duct. A metallic biliary stent was placed. Brushings in the mid and lower third of the main bile duct were obtained with no malignant cells identified.   Bilirubin improved at 4.7 on 09/10/2018.   Biopsy of a liver lesion on 09/11/2018-metastatic carcinoma, cytokeratin 7 and TTF-1 positive, negative for cytokeratin 20, CDX 2, and PSA.  CT chest 09/22/2018-right hilar/mediastinal/retrocrural lymphadenopathy, ill-defined right lower lobe nodularity, emphysema  Cycle 1 carboplatin/Alimta/pembrolizumab 10/09/2018  Cycle 2 carboplatin/Alimta/pembrolizumab 10/30/2018  Cycle 3 carboplatin/Alimta/pembrolizumab 11/20/2018  Cycle 4 carboplatin/Alimta/pembrolizumab 12/11/2018  CT 12/26/2018- marked response to therapy of the radical abdominal adenopathy, decreased size of hepatic metastases, no new or progressive disease  Cycle 5 Alimta/pembrolizumab 01/01/2019  Cycle 6 Alimta/Pembrolizumab 7/31/20220 2. Biliary obstruction status post stent placement 3. COPD 4. Hypertension 5. Trace edema right leg 12/11/2018.  Referred for Doppler. 6. Watery eyes, runny nose reported 12/11/2018.  Question related to Alimta. 7. Admission 12/24/2018 with a fever- no source for infection identified, completed outpatient course of ciprofloxacin 8. Bilateral lower extremity edema-exacerbated by ambulation and improved with lower extremity elevation. Low suspicion for DVT, skin infection, and heart failure. Possibly secondary to his IVF from his recent hospitalization with possible contribution from his  Norvasc. Patient instructed to elevate lower extremities and use compression stockings. Also advised to increase  his protein intake for possible borderline low albumin.   Disposition:  The patient is feeling well today following his last cycle of maintenance Alimta and Keytruda. The patient was seen with Marlynn Perking today to evaluate his lower extremity swelling. The patient recently had a doppler ultrasound for DVT that was negative. He denies calf tenderness. No recent symptoms of shortness of breath, cough, or chest pain. Low suspicion for DTV. No respiratory symptoms or venous distension to suggest heart failure. No rashes or fevers to suggest infection. The patient notes mild erythema bilaterally with increased swelling but the swelling and skin discoloration almost completely resolve with lower extremity elevation. Albumin is within normal limits and improved from prior. The patient was instructed to continue to elevate his lower extremities and to use compression stockings. If no improvement in his symptoms with these measures, then we will discuss possibly looking into his Norvasc as a possible etiology at his next visit in 3 weeks.   The patient will continue on his current bowel regimen for his constipation. He also was encouraged to eat plenty of fruits and vegetables, increase his water intake, and increase his activity as tolerated.   Labs were reviewed with the patient which are adequate for treatment. He will proceed with cycle #6 today as scheduled. Ricky Conway will return for an office visit and chemotherapy in 3 weeks.  No orders of the defined types were placed in this encounter.    Ricky Moffa L Emaline Karnes, PA-C 01/23/19

## 2019-01-23 NOTE — Patient Instructions (Signed)
[facility] Discharge Instructions for Patients Receiving Chemotherapy  Today you received the following chemotherapy agents Pembrolizumab (KEYTRUDA) & Pemetrexed (ALIMTA).  To help prevent nausea and vomiting after your treatment, we encourage you to take your nausea medication as prescribed.  If you develop nausea and vomiting that is not controlled by your nausea medication, call the clinic.   BELOW ARE SYMPTOMS THAT SHOULD BE REPORTED IMMEDIATELY:  *FEVER GREATER THAN 100.5 F  *CHILLS WITH OR WITHOUT FEVER  NAUSEA AND VOMITING THAT IS NOT CONTROLLED WITH YOUR NAUSEA MEDICATION  *UNUSUAL SHORTNESS OF BREATH  *UNUSUAL BRUISING OR BLEEDING  TENDERNESS IN MOUTH AND THROAT WITH OR WITHOUT PRESENCE OF ULCERS  *URINARY PROBLEMS  *BOWEL PROBLEMS  UNUSUAL RASH Items with * indicate a potential emergency and should be followed up as soon as possible.  Feel free to call the clinic should you have any questions or concerns. The clinic phone number is [phone].  Please show the [facility] at check-in to the Emergency Department and triage nurse.  Coronavirus (COVID-19) Are you at risk?  Are you at risk for the Coronavirus (COVID-19)?  To be considered HIGH RISK for Coronavirus (COVID-19), you have to meet the following criteria:  . Traveled to [place], [place], [place], [place] or [place]; or in the [place] to [place], [place], [place], or [place]; and have fever, cough, and shortness of breath within the last 2 weeks of travel OR . Been in close contact with a person diagnosed with COVID-19 within the last 2 weeks and have fever, cough, and shortness of breath . IF YOU DO NOT MEET THESE CRITERIA, YOU ARE CONSIDERED LOW RISK FOR COVID-19.  What to do if you are HIGH RISK for COVID-19?  Marland Kitchen If you are having a medical emergency, call 911. . Seek medical care right away. Before you go to a doctor's office, urgent care or emergency department,  call ahead and tell them about your recent travel, contact with someone diagnosed with COVID-19, and your symptoms. You should receive instructions from your physician's office regarding next steps of care.  . When you arrive at healthcare provider, tell the healthcare staff immediately you have returned from visiting [place], [place], [place], [place] or [place]; or traveled in the [place] to [place], [place], [place], or [place]; in the last two weeks or you have been in close contact with a person diagnosed with COVID-19 in the last 2 weeks.   . Tell the health care staff about your symptoms: fever, cough and shortness of breath. . After you have been seen by a medical provider, you will be either: o Tested for (COVID-19) and discharged home on quarantine except to seek medical care if symptoms worsen, and asked to  - Stay home and avoid contact with others until you get your results (4-5 days)  - Avoid travel on public transportation if possible (such as bus, train, or airplane) or o Sent to the Emergency Department by EMS for evaluation, COVID-19 testing, and possible admission depending on your condition and test results.  What to do if you are LOW RISK for COVID-19?  Reduce your risk of any infection by using the same precautions used for avoiding the common cold or flu:  Marland Kitchen Wash your hands often with soap and warm water for at least 20 seconds.  If soap and water are not readily available, use an alcohol-based hand sanitizer with at least 60% alcohol.  . If  coughing or sneezing, cover your mouth and nose by coughing or sneezing into the elbow areas of your shirt or coat, into a tissue or into your sleeve (not your hands). . Avoid shaking hands with others and consider head nods or verbal greetings only. . Avoid touching your eyes, nose, or mouth with unwashed hands.  . Avoid close contact with people who are sick. . Avoid places or events with large numbers of people in one  location, like concerts or sporting events. . Carefully consider travel plans you have or are making. . If you are planning any travel outside or inside the [place], visit the CDC's Travelers' Health webpage for the latest health notices. . If you have some symptoms but not all symptoms, continue to monitor at home and seek medical attention if your symptoms worsen. . If you are having a medical emergency, call 911.   [place] / e-Visit: [email or web address]         [facility]: [facility]: [phone]                   [facility]: [phone]

## 2019-01-28 ENCOUNTER — Telehealth: Payer: Self-pay | Admitting: *Deleted

## 2019-01-28 NOTE — Telephone Encounter (Signed)
Called to report Ricky Conway extended his disability out to expire on 01/28/2019. He needs MD to keep him out of work as long as he feels he is able to. Says there is no way he can work through all his treatment. Will forward this information to Thompson Caul in managed care to re-submit his paperwork.

## 2019-02-02 ENCOUNTER — Telehealth: Payer: Self-pay | Admitting: *Deleted

## 2019-02-02 NOTE — Telephone Encounter (Addendum)
Requesting update on current condition/status and treatment plans. Called back with treatment update and that chemo will continue until it is not tolerated or he progresses. Last CT scan showed a good response to tx. He will be out indefinitely per Dr. Benay Spice.

## 2019-02-08 ENCOUNTER — Other Ambulatory Visit: Payer: Self-pay | Admitting: Oncology

## 2019-02-12 ENCOUNTER — Inpatient Hospital Stay: Payer: 59

## 2019-02-12 ENCOUNTER — Telehealth: Payer: Self-pay | Admitting: Oncology

## 2019-02-12 ENCOUNTER — Inpatient Hospital Stay: Payer: 59 | Attending: Oncology | Admitting: Oncology

## 2019-02-12 ENCOUNTER — Other Ambulatory Visit: Payer: Self-pay

## 2019-02-12 VITALS — BP 119/54 | HR 89 | Temp 98.3°F | Resp 17 | Ht 70.0 in | Wt 172.1 lb

## 2019-02-12 DIAGNOSIS — C3431 Malignant neoplasm of lower lobe, right bronchus or lung: Secondary | ICD-10-CM | POA: Diagnosis not present

## 2019-02-12 DIAGNOSIS — C3490 Malignant neoplasm of unspecified part of unspecified bronchus or lung: Secondary | ICD-10-CM

## 2019-02-12 DIAGNOSIS — C787 Secondary malignant neoplasm of liver and intrahepatic bile duct: Secondary | ICD-10-CM | POA: Insufficient documentation

## 2019-02-12 DIAGNOSIS — Z5112 Encounter for antineoplastic immunotherapy: Secondary | ICD-10-CM | POA: Insufficient documentation

## 2019-02-12 DIAGNOSIS — Z5111 Encounter for antineoplastic chemotherapy: Secondary | ICD-10-CM | POA: Insufficient documentation

## 2019-02-12 LAB — CMP (CANCER CENTER ONLY)
ALT: 34 U/L (ref 0–44)
AST: 34 U/L (ref 15–41)
Albumin: 3.3 g/dL — ABNORMAL LOW (ref 3.5–5.0)
Alkaline Phosphatase: 103 U/L (ref 38–126)
Anion gap: 14 (ref 5–15)
BUN: 9 mg/dL (ref 8–23)
CO2: 23 mmol/L (ref 22–32)
Calcium: 8.9 mg/dL (ref 8.9–10.3)
Chloride: 98 mmol/L (ref 98–111)
Creatinine: 0.73 mg/dL (ref 0.61–1.24)
GFR, Est AFR Am: >60 mL/min (ref >60)
GFR, Estimated: >60 mL/min (ref >60)
Glucose, Bld: 122 mg/dL — ABNORMAL HIGH (ref 70–99)
Potassium: 3.7 mmol/L (ref 3.5–5.1)
Sodium: 135 mmol/L (ref 135–145)
Total Bilirubin: 0.5 mg/dL (ref 0.3–1.2)
Total Protein: 7 g/dL (ref 6.5–8.1)

## 2019-02-12 LAB — CBC WITH DIFFERENTIAL (CANCER CENTER ONLY)
Abs Immature Granulocytes: 0.05 10*3/uL (ref 0.00–0.07)
Basophils Absolute: 0.1 10*3/uL (ref 0.0–0.1)
Basophils Relative: 1 %
Eosinophils Absolute: 0.2 10*3/uL (ref 0.0–0.5)
Eosinophils Relative: 2 %
HCT: 45.5 % (ref 39.0–52.0)
Hemoglobin: 15.8 g/dL (ref 13.0–17.0)
Immature Granulocytes: 1 %
Lymphocytes Relative: 10 %
Lymphs Abs: 1 10*3/uL (ref 0.7–4.0)
MCH: 36.4 pg — ABNORMAL HIGH (ref 26.0–34.0)
MCHC: 34.7 g/dL (ref 30.0–36.0)
MCV: 104.8 fL — ABNORMAL HIGH (ref 80.0–100.0)
Monocytes Absolute: 1 10*3/uL (ref 0.1–1.0)
Monocytes Relative: 10 %
Neutro Abs: 8 10*3/uL — ABNORMAL HIGH (ref 1.7–7.7)
Neutrophils Relative %: 76 %
Platelet Count: 295 10*3/uL (ref 150–400)
RBC: 4.34 MIL/uL (ref 4.22–5.81)
RDW: 14.6 % (ref 11.5–15.5)
WBC Count: 10.3 10*3/uL (ref 4.0–10.5)
nRBC: 0 % (ref 0.0–0.2)

## 2019-02-12 MED ORDER — SODIUM CHLORIDE 0.9 % IV SOLN
520.0000 mg/m2 | Freq: Once | INTRAVENOUS | Status: AC
  Administered 2019-02-12: 10:00:00 1000 mg via INTRAVENOUS
  Filled 2019-02-12: qty 40

## 2019-02-12 MED ORDER — SODIUM CHLORIDE 0.9 % IV SOLN
Freq: Once | INTRAVENOUS | Status: AC
  Administered 2019-02-12: 09:00:00 via INTRAVENOUS
  Filled 2019-02-12: qty 250

## 2019-02-12 MED ORDER — SODIUM CHLORIDE 0.9 % IV SOLN
200.0000 mg | Freq: Once | INTRAVENOUS | Status: AC
  Administered 2019-02-12: 200 mg via INTRAVENOUS
  Filled 2019-02-12: qty 8

## 2019-02-12 MED ORDER — PROCHLORPERAZINE MALEATE 10 MG PO TABS
10.0000 mg | ORAL_TABLET | Freq: Once | ORAL | Status: AC
  Administered 2019-02-12: 10 mg via ORAL

## 2019-02-12 MED ORDER — PROCHLORPERAZINE MALEATE 10 MG PO TABS
ORAL_TABLET | ORAL | Status: AC
  Filled 2019-02-12: qty 1

## 2019-02-12 NOTE — Patient Instructions (Signed)
[facility] Discharge Instructions for Patients Receiving Chemotherapy  Today you received the following chemotherapy agents: Keytruda, Alimta.  To help prevent nausea and vomiting after your treatment, we encourage you to take your nausea medication as prescribed. If you develop nausea and vomiting that is not controlled by your nausea medication, call the clinic.   BELOW ARE SYMPTOMS THAT SHOULD BE REPORTED IMMEDIATELY:  *FEVER GREATER THAN 100.5 F  *CHILLS WITH OR WITHOUT FEVER  NAUSEA AND VOMITING THAT IS NOT CONTROLLED WITH YOUR NAUSEA MEDICATION  *UNUSUAL SHORTNESS OF BREATH  *UNUSUAL BRUISING OR BLEEDING  TENDERNESS IN MOUTH AND THROAT WITH OR WITHOUT PRESENCE OF ULCERS  *URINARY PROBLEMS  *BOWEL PROBLEMS  UNUSUAL RASH Items with * indicate a potential emergency and should be followed up as soon as possible.  Feel free to call the clinic should you have any questions or concerns. The clinic phone number is [phone].  Please show the [facility] at check-in to the Emergency Department and triage nurse.

## 2019-02-12 NOTE — Telephone Encounter (Signed)
Scheduled per los. Patient was given printout

## 2019-02-12 NOTE — Progress Notes (Signed)
[facility] OFFICE PROGRESS NOTE   Diagnosis: Non-small cell lung cancer  INTERVAL HISTORY:   Mr. Suzan Slick completed another cycle of Alimta/pembrolizumab on 01/23/2019.  No nausea, fever, rash, or mouth sores.  He reports constipation.  No pain.  Good appetite.  Objective:  Vital signs in last 24 hours:  Blood pressure (!) 119/54, pulse 89, temperature 98.3 F (36.8 C), temperature source Oral, resp. rate 17, height 5\' 10"  (1.778 m), weight 172 lb 1.6 oz (78.1 kg), SpO2 90 %.    Limited physical examination secondary to distancing with the COVID pandemic Resp: Distant breath sounds, clear bilaterally, no respiratory distress Cardio: Regular rate and rhythm GI: No hepatomegaly, nontender Vascular: No leg edema   Lab Results:  Lab Results  Component Value Date   WBC 10.3 02/12/2019   HGB 15.8 02/12/2019   HCT 45.5 02/12/2019   MCV 104.8 (H) 02/12/2019   PLT 295 02/12/2019   NEUTROABS 8.0 (H) 02/12/2019    CMP  Lab Results  Component Value Date   NA 136 01/23/2019   K 4.2 01/23/2019   CL 100 01/23/2019   CO2 26 01/23/2019   GLUCOSE 99 01/23/2019   BUN 12 01/23/2019   CREATININE 0.69 01/23/2019   CALCIUM 9.2 01/23/2019   PROT 6.9 01/23/2019   ALBUMIN 3.6 01/23/2019   AST 31 01/23/2019   ALT 33 01/23/2019   ALKPHOS 103 01/23/2019   BILITOT 0.5 01/23/2019   GFRNONAA >60 01/23/2019   GFRAA >60 01/23/2019    Lab Results  Component Value Date   CEA1 227.40 (H) 01/23/2019     Medications: I have reviewed the patient's current medications.   Assessment/Plan: 1. Pancreas mass, liver lesions  Abdominal ultrasound 09/03/2018-3 liver lesions. Intra-and extrahepatic biliary ductal dilatation.  Presentation to the emergency department 09/08/2018 with painless jaundice. Initial bilirubin returned at 18.3.   MRI abdomen 09/08/2018-1.9 x 2.6 cm hypoenhancing lesion arising exophytically along the posterior aspect of the pancreatic head suspicious  for pancreatic adenocarcinoma. There was suspected involvement of the distal common duct with secondary intrahepatic/extrahepatic ductal dilatation. Lesion noted to abut the IVC. Multiple liver metastases noted. Small upper abdominal/retroperitoneal lymph nodes noted.   ERCP 09/09/2018-high-grade stricture and obstruction of the distal common bile duct. A metallic biliary stent was placed. Brushings in the mid and lower third of the main bile duct were obtained with no malignant cells identified.   Bilirubin improved at 4.7 on 09/10/2018.   Biopsy of a liver lesion on 09/11/2018-metastatic carcinoma, cytokeratin 7 and TTF-1 positive, negative for cytokeratin 20, CDX 2, and PSA.  CT chest 09/22/2018-right hilar/mediastinal/retrocrural lymphadenopathy, ill-defined right lower lobe nodularity, emphysema  Cycle 1 carboplatin/Alimta/pembrolizumab 10/09/2018  Cycle 2 carboplatin/Alimta/pembrolizumab 10/30/2018  Cycle 3 carboplatin/Alimta/pembrolizumab 11/20/2018  Cycle 4 carboplatin/Alimta/pembrolizumab 12/11/2018  CT 12/26/2018- marked response to therapy of the radical abdominal adenopathy, decreased size of hepatic metastases, no new or progressive disease  Cycle 5 Alimta/pembrolizumab 01/01/2019  Cycle 6 Alimta/pembrolizumab 01/23/2019  Cycle 7 Alimta/pembrolizumab 02/12/2019 2. Biliary obstruction status post stent placement 3. COPD 4. Hypertension 5. Trace edema right leg 12/11/2018. Doppler negative for DVT 12/12/2018. 6. Watery eyes, runny nose reported 12/11/2018.  Question related to Alimta. 7. Admission 12/24/2018 with a fever- no source for infection identified, completed outpatient course of ciprofloxacin  Disposition: Ricky Conway is tolerating the systemic therapy well.  There is no evidence for disease progression.  He will continue Alimta/pembrolizumab.  He will return for office visit and chemotherapy in 3 weeks.  15 minutes were  spent with the patient today.  The majority of the  time was used for counseling and coordination of care.  [name], MD  02/12/2019  8:16 AM

## 2019-03-03 ENCOUNTER — Other Ambulatory Visit: Payer: Self-pay | Admitting: Oncology

## 2019-03-05 ENCOUNTER — Inpatient Hospital Stay: Payer: 59

## 2019-03-05 ENCOUNTER — Other Ambulatory Visit: Payer: Self-pay | Admitting: *Deleted

## 2019-03-05 ENCOUNTER — Other Ambulatory Visit: Payer: Self-pay

## 2019-03-05 ENCOUNTER — Inpatient Hospital Stay: Payer: 59 | Attending: Oncology | Admitting: Oncology

## 2019-03-05 ENCOUNTER — Telehealth: Payer: Self-pay | Admitting: Oncology

## 2019-03-05 VITALS — BP 106/60 | HR 81 | Temp 98.7°F | Resp 18 | Ht 70.0 in | Wt 172.5 lb

## 2019-03-05 DIAGNOSIS — C3431 Malignant neoplasm of lower lobe, right bronchus or lung: Secondary | ICD-10-CM | POA: Insufficient documentation

## 2019-03-05 DIAGNOSIS — C3490 Malignant neoplasm of unspecified part of unspecified bronchus or lung: Secondary | ICD-10-CM | POA: Diagnosis not present

## 2019-03-05 DIAGNOSIS — Z5112 Encounter for antineoplastic immunotherapy: Secondary | ICD-10-CM | POA: Diagnosis present

## 2019-03-05 DIAGNOSIS — C787 Secondary malignant neoplasm of liver and intrahepatic bile duct: Secondary | ICD-10-CM | POA: Insufficient documentation

## 2019-03-05 LAB — CBC WITH DIFFERENTIAL (CANCER CENTER ONLY)
Abs Immature Granulocytes: 0.04 10*3/uL (ref 0.00–0.07)
Basophils Absolute: 0.1 10*3/uL (ref 0.0–0.1)
Basophils Relative: 1 %
Eosinophils Absolute: 0.2 10*3/uL (ref 0.0–0.5)
Eosinophils Relative: 2 %
HCT: 46 % (ref 39.0–52.0)
Hemoglobin: 15.7 g/dL (ref 13.0–17.0)
Immature Granulocytes: 0 %
Lymphocytes Relative: 11 %
Lymphs Abs: 1.2 10*3/uL (ref 0.7–4.0)
MCH: 36.2 pg — ABNORMAL HIGH (ref 26.0–34.0)
MCHC: 34.1 g/dL (ref 30.0–36.0)
MCV: 106 fL — ABNORMAL HIGH (ref 80.0–100.0)
Monocytes Absolute: 1.6 10*3/uL — ABNORMAL HIGH (ref 0.1–1.0)
Monocytes Relative: 15 %
Neutro Abs: 7.9 10*3/uL — ABNORMAL HIGH (ref 1.7–7.7)
Neutrophils Relative %: 71 %
Platelet Count: 302 10*3/uL (ref 150–400)
RBC: 4.34 MIL/uL (ref 4.22–5.81)
RDW: 14.4 % (ref 11.5–15.5)
WBC Count: 10.9 10*3/uL — ABNORMAL HIGH (ref 4.0–10.5)
nRBC: 0 % (ref 0.0–0.2)

## 2019-03-05 LAB — CMP (CANCER CENTER ONLY)
ALT: 33 U/L (ref 0–44)
AST: 38 U/L (ref 15–41)
Albumin: 3.5 g/dL (ref 3.5–5.0)
Alkaline Phosphatase: 111 U/L (ref 38–126)
Anion gap: 9 (ref 5–15)
BUN: 13 mg/dL (ref 8–23)
CO2: 27 mmol/L (ref 22–32)
Calcium: 9 mg/dL (ref 8.9–10.3)
Chloride: 98 mmol/L (ref 98–111)
Creatinine: 0.71 mg/dL (ref 0.61–1.24)
GFR, Est AFR Am: >60 mL/min (ref >60)
GFR, Estimated: >60 mL/min (ref >60)
Glucose, Bld: 102 mg/dL — ABNORMAL HIGH (ref 70–99)
Potassium: 4.4 mmol/L (ref 3.5–5.1)
Sodium: 134 mmol/L — ABNORMAL LOW (ref 135–145)
Total Bilirubin: 0.5 mg/dL (ref 0.3–1.2)
Total Protein: 7.3 g/dL (ref 6.5–8.1)

## 2019-03-05 LAB — TSH: TSH: 7.531 u[IU]/mL — ABNORMAL HIGH (ref 0.320–4.118)

## 2019-03-05 MED ORDER — PROCHLORPERAZINE MALEATE 10 MG PO TABS
10.0000 mg | ORAL_TABLET | Freq: Once | ORAL | Status: AC
  Administered 2019-03-05: 10 mg via ORAL

## 2019-03-05 MED ORDER — SODIUM CHLORIDE 0.9 % IV SOLN
200.0000 mg | Freq: Once | INTRAVENOUS | Status: AC
  Administered 2019-03-05: 200 mg via INTRAVENOUS
  Filled 2019-03-05: qty 8

## 2019-03-05 MED ORDER — PROCHLORPERAZINE MALEATE 10 MG PO TABS
ORAL_TABLET | ORAL | Status: AC
  Filled 2019-03-05: qty 1

## 2019-03-05 MED ORDER — SODIUM CHLORIDE 0.9 % IV SOLN
Freq: Once | INTRAVENOUS | Status: AC
  Administered 2019-03-05: 11:00:00 via INTRAVENOUS
  Filled 2019-03-05: qty 250

## 2019-03-05 MED ORDER — SODIUM CHLORIDE 0.9 % IV SOLN
520.0000 mg/m2 | Freq: Once | INTRAVENOUS | Status: AC
  Administered 2019-03-05: 1000 mg via INTRAVENOUS
  Filled 2019-03-05: qty 40

## 2019-03-05 NOTE — Progress Notes (Signed)
[facility] OFFICE PROGRESS NOTE   Diagnosis: Lung cancer  INTERVAL HISTORY:   Ricky Conway completed another cycle of Alimta/pembrolizumab on 02/12/2019.  He tolerated treatment well.  Good appetite.  He reports mild lower leg swelling.  He is wearing compression stockings.  He has intermittent discomfort at the right low lateral chest lasting minutes.  Objective:  Vital signs in last 24 hours:  Blood pressure 106/60, pulse 81, temperature 98.7 F (37.1 C), temperature source Oral, resp. rate 18, height 5\' 10"  (1.778 m), weight 172 lb 8 oz (78.2 kg), SpO2 99 %.    HEENT: No thrush or ulcers Resp: Distant breath sounds, no respiratory distress Cardio: Regular rate and rhythm GI: No hepatosplenomegaly, nontender Vascular: Trace pitting edema at the lower leg bilaterally       Lab Results:  Lab Results  Component Value Date   WBC 10.9 (H) 03/05/2019   HGB 15.7 03/05/2019   HCT 46.0 03/05/2019   MCV 106.0 (H) 03/05/2019   PLT 302 03/05/2019   NEUTROABS 7.9 (H) 03/05/2019    CMP  Lab Results  Component Value Date   NA 135 02/12/2019   K 3.7 02/12/2019   CL 98 02/12/2019   CO2 23 02/12/2019   GLUCOSE 122 (H) 02/12/2019   BUN 9 02/12/2019   CREATININE 0.73 02/12/2019   CALCIUM 8.9 02/12/2019   PROT 7.0 02/12/2019   ALBUMIN 3.3 (L) 02/12/2019   AST 34 02/12/2019   ALT 34 02/12/2019   ALKPHOS 103 02/12/2019   BILITOT 0.5 02/12/2019   GFRNONAA >60 02/12/2019   GFRAA >60 02/12/2019    Lab Results  Component Value Date   CEA1 227.40 (H) 01/23/2019     Medications: I have reviewed the patient's current medications.   Assessment/Plan:  1. Pancreas mass, liver lesions  Abdominal ultrasound 09/03/2018-3 liver lesions. Intra-and extrahepatic biliary ductal dilatation.  Presentation to the emergency department 09/08/2018 with painless jaundice. Initial bilirubin returned at 18.3.   MRI abdomen 09/08/2018-1.9 x 2.6 cm hypoenhancing lesion  arising exophytically along the posterior aspect of the pancreatic head suspicious for pancreatic adenocarcinoma. There was suspected involvement of the distal common duct with secondary intrahepatic/extrahepatic ductal dilatation. Lesion noted to abut the IVC. Multiple liver metastases noted. Small upper abdominal/retroperitoneal lymph nodes noted.   ERCP 09/09/2018-high-grade stricture and obstruction of the distal common bile duct. A metallic biliary stent was placed. Brushings in the mid and lower third of the main bile duct were obtained with no malignant cells identified.   Bilirubin improved at 4.7 on 09/10/2018.   Biopsy of a liver lesion on 09/11/2018-metastatic carcinoma, cytokeratin 7 and TTF-1 positive, negative for cytokeratin 20, CDX 2, and PSA.  CT chest 09/22/2018-right hilar/mediastinal/retrocrural lymphadenopathy, ill-defined right lower lobe nodularity, emphysema  Cycle 1 carboplatin/Alimta/pembrolizumab 10/09/2018  Cycle 2 carboplatin/Alimta/pembrolizumab 10/30/2018  Cycle 3 carboplatin/Alimta/pembrolizumab 11/20/2018  Cycle 4 carboplatin/Alimta/pembrolizumab 12/11/2018  CT 12/26/2018- marked response to therapy of the radical abdominal adenopathy, decreased size of hepatic metastases, no new or progressive disease  Cycle 5 Alimta/pembrolizumab 01/01/2019  Cycle 6 Alimta/pembrolizumab 01/23/2019  Cycle 7 Alimta/pembrolizumab 02/12/2019  Cycle 8 of Alimta/pembrolizumab 03/05/2019 2. Biliary obstruction status post stent placement 3. COPD 4. Hypertension 5. Trace edema right leg 12/11/2018. Doppler negative for DVT 12/12/2018. 6. Watery eyes, runny nose reported 12/11/2018.  Question related to Alimta. 7. Admission 12/24/2018 with a fever- no source for infection identified, completed outpatient course of ciprofloxacin   Disposition: Ricky Conway appears unchanged.  He will complete another cycle of  Alimta/pembrolizumab today.  We will follow-up on the PSA from today.  He  plans to see Dr. Brigitte Pulse next week.  He will discuss the indication for continuing antihypertensive therapy since the blood pressure readings have been in the low normal range.  The leg swelling may be in part related to amlodipine.  He will request an influenza vaccine with Dr. Brigitte Pulse.  Ricky Conway will return for an office visit in 3 weeks.  [name], MD  03/05/2019  10:14 AM

## 2019-03-05 NOTE — Patient Instructions (Signed)
[facility] Discharge Instructions for Patients Receiving Chemotherapy  Today you received the following chemotherapy agents: Keytruda, Alimta.  To help prevent nausea and vomiting after your treatment, we encourage you to take your nausea medication as prescribed. If you develop nausea and vomiting that is not controlled by your nausea medication, call the clinic.   BELOW ARE SYMPTOMS THAT SHOULD BE REPORTED IMMEDIATELY:  *FEVER GREATER THAN 100.5 F  *CHILLS WITH OR WITHOUT FEVER  NAUSEA AND VOMITING THAT IS NOT CONTROLLED WITH YOUR NAUSEA MEDICATION  *UNUSUAL SHORTNESS OF BREATH  *UNUSUAL BRUISING OR BLEEDING  TENDERNESS IN MOUTH AND THROAT WITH OR WITHOUT PRESENCE OF ULCERS  *URINARY PROBLEMS  *BOWEL PROBLEMS  UNUSUAL RASH Items with * indicate a potential emergency and should be followed up as soon as possible.  Feel free to call the clinic should you have any questions or concerns. The clinic phone number is [phone].  Please show the [facility] at check-in to the Emergency Department and triage nurse.

## 2019-03-05 NOTE — Telephone Encounter (Signed)
Scheduled per los. Gave avs and calendar  

## 2019-03-20 ENCOUNTER — Other Ambulatory Visit: Payer: Self-pay | Admitting: Oncology

## 2019-03-26 ENCOUNTER — Inpatient Hospital Stay: Payer: 59

## 2019-03-26 ENCOUNTER — Inpatient Hospital Stay: Payer: 59 | Attending: Oncology | Admitting: Oncology

## 2019-03-26 ENCOUNTER — Other Ambulatory Visit: Payer: Self-pay

## 2019-03-26 VITALS — BP 151/85 | HR 84 | Temp 98.3°F | Resp 19 | Ht 70.0 in | Wt 174.1 lb

## 2019-03-26 DIAGNOSIS — Z5112 Encounter for antineoplastic immunotherapy: Secondary | ICD-10-CM | POA: Insufficient documentation

## 2019-03-26 DIAGNOSIS — C3490 Malignant neoplasm of unspecified part of unspecified bronchus or lung: Secondary | ICD-10-CM

## 2019-03-26 DIAGNOSIS — C787 Secondary malignant neoplasm of liver and intrahepatic bile duct: Secondary | ICD-10-CM | POA: Diagnosis not present

## 2019-03-26 DIAGNOSIS — Z79899 Other long term (current) drug therapy: Secondary | ICD-10-CM | POA: Diagnosis not present

## 2019-03-26 DIAGNOSIS — Z5111 Encounter for antineoplastic chemotherapy: Secondary | ICD-10-CM | POA: Diagnosis not present

## 2019-03-26 LAB — CBC WITH DIFFERENTIAL (CANCER CENTER ONLY)
Abs Immature Granulocytes: 0.12 10*3/uL — ABNORMAL HIGH (ref 0.00–0.07)
Basophils Absolute: 0.1 10*3/uL (ref 0.0–0.1)
Basophils Relative: 0 %
Eosinophils Absolute: 0.1 10*3/uL (ref 0.0–0.5)
Eosinophils Relative: 0 %
HCT: 45.7 % (ref 39.0–52.0)
Hemoglobin: 15.7 g/dL (ref 13.0–17.0)
Immature Granulocytes: 1 %
Lymphocytes Relative: 5 %
Lymphs Abs: 0.9 10*3/uL (ref 0.7–4.0)
MCH: 35.9 pg — ABNORMAL HIGH (ref 26.0–34.0)
MCHC: 34.4 g/dL (ref 30.0–36.0)
MCV: 104.6 fL — ABNORMAL HIGH (ref 80.0–100.0)
Monocytes Absolute: 2 10*3/uL — ABNORMAL HIGH (ref 0.1–1.0)
Monocytes Relative: 11 %
Neutro Abs: 15.5 10*3/uL — ABNORMAL HIGH (ref 1.7–7.7)
Neutrophils Relative %: 83 %
Platelet Count: 288 10*3/uL (ref 150–400)
RBC: 4.37 MIL/uL (ref 4.22–5.81)
RDW: 14.5 % (ref 11.5–15.5)
WBC Count: 18.6 10*3/uL — ABNORMAL HIGH (ref 4.0–10.5)
nRBC: 0 % (ref 0.0–0.2)

## 2019-03-26 LAB — CMP (CANCER CENTER ONLY)
ALT: 38 U/L (ref 0–44)
AST: 37 U/L (ref 15–41)
Albumin: 3.4 g/dL — ABNORMAL LOW (ref 3.5–5.0)
Alkaline Phosphatase: 108 U/L (ref 38–126)
Anion gap: 11 (ref 5–15)
BUN: 12 mg/dL (ref 8–23)
CO2: 26 mmol/L (ref 22–32)
Calcium: 8.8 mg/dL — ABNORMAL LOW (ref 8.9–10.3)
Chloride: 95 mmol/L — ABNORMAL LOW (ref 98–111)
Creatinine: 0.74 mg/dL (ref 0.61–1.24)
GFR, Est AFR Am: >60 mL/min (ref >60)
GFR, Estimated: >60 mL/min (ref >60)
Glucose, Bld: 115 mg/dL — ABNORMAL HIGH (ref 70–99)
Potassium: 4 mmol/L (ref 3.5–5.1)
Sodium: 132 mmol/L — ABNORMAL LOW (ref 135–145)
Total Bilirubin: 0.8 mg/dL (ref 0.3–1.2)
Total Protein: 6.8 g/dL (ref 6.5–8.1)

## 2019-03-26 LAB — TSH: TSH: 4.586 u[IU]/mL — ABNORMAL HIGH (ref 0.320–4.118)

## 2019-03-26 LAB — CEA (IN HOUSE-CHCC): CEA (CHCC-In House): 180.73 ng/mL — ABNORMAL HIGH (ref 0.00–5.00)

## 2019-03-26 MED ORDER — PROCHLORPERAZINE MALEATE 10 MG PO TABS
ORAL_TABLET | ORAL | Status: AC
  Filled 2019-03-26: qty 1

## 2019-03-26 MED ORDER — SODIUM CHLORIDE 0.9% FLUSH
10.0000 mL | INTRAVENOUS | Status: DC | PRN
  Administered 2019-03-26: 10 mL
  Filled 2019-03-26: qty 10

## 2019-03-26 MED ORDER — SODIUM CHLORIDE 0.9 % IV SOLN
Freq: Once | INTRAVENOUS | Status: AC
  Administered 2019-03-26: 11:00:00 via INTRAVENOUS
  Filled 2019-03-26: qty 250

## 2019-03-26 MED ORDER — CYANOCOBALAMIN 1000 MCG/ML IJ SOLN
1000.0000 ug | Freq: Once | INTRAMUSCULAR | Status: AC
  Administered 2019-03-26: 1000 ug via INTRAMUSCULAR

## 2019-03-26 MED ORDER — PROCHLORPERAZINE MALEATE 10 MG PO TABS
10.0000 mg | ORAL_TABLET | Freq: Once | ORAL | Status: AC
  Administered 2019-03-26: 10 mg via ORAL

## 2019-03-26 MED ORDER — HEPARIN SOD (PORK) LOCK FLUSH 100 UNIT/ML IV SOLN
500.0000 [IU] | Freq: Once | INTRAVENOUS | Status: AC | PRN
  Administered 2019-03-26: 500 [IU]
  Filled 2019-03-26: qty 5

## 2019-03-26 MED ORDER — SODIUM CHLORIDE 0.9 % IV SOLN
520.0000 mg/m2 | Freq: Once | INTRAVENOUS | Status: AC
  Administered 2019-03-26: 1000 mg via INTRAVENOUS
  Filled 2019-03-26: qty 40

## 2019-03-26 MED ORDER — SODIUM CHLORIDE 0.9 % IV SOLN
200.0000 mg | Freq: Once | INTRAVENOUS | Status: AC
  Administered 2019-03-26: 200 mg via INTRAVENOUS
  Filled 2019-03-26: qty 8

## 2019-03-26 MED ORDER — CYANOCOBALAMIN 1000 MCG/ML IJ SOLN
INTRAMUSCULAR | Status: AC
  Filled 2019-03-26: qty 1

## 2019-03-26 NOTE — Progress Notes (Signed)
[facility] OFFICE PROGRESS NOTE   Diagnosis: Non-small cell lung cancer  INTERVAL HISTORY:   Ricky Conway completed another cycle of Alimta/pembrolizumab on 03/05/2019.  No rash, mouth sores, or diarrhea.  He feels well.  He had a "chill "last night.  No fever.  He has received an influenza vaccine.  Objective:  Vital signs in last 24 hours:  Blood pressure (!) 151/85, pulse 84, temperature 98.3 F (36.8 C), temperature source Oral, resp. rate 19, height 5\' 10"  (1.778 m), weight 174 lb 1.6 oz (79 kg), SpO2 90 %.    Resp: Distant breath sounds, scattered end inspiratory rhonchi, no respiratory distress Cardio: Regular rate and rhythm with an occasional pause GI: No hepatosplenomegaly Vascular: Trace edema at the right leg below the knee   Lab Results:  Lab Results  Component Value Date   WBC 18.6 (H) 03/26/2019   HGB 15.7 03/26/2019   HCT 45.7 03/26/2019   MCV 104.6 (H) 03/26/2019   PLT 288 03/26/2019   NEUTROABS 15.5 (H) 03/26/2019    CMP  Lab Results  Component Value Date   NA 134 (L) 03/05/2019   K 4.4 03/05/2019   CL 98 03/05/2019   CO2 27 03/05/2019   GLUCOSE 102 (H) 03/05/2019   BUN 13 03/05/2019   CREATININE 0.71 03/05/2019   CALCIUM 9.0 03/05/2019   PROT 7.3 03/05/2019   ALBUMIN 3.5 03/05/2019   AST 38 03/05/2019   ALT 33 03/05/2019   ALKPHOS 111 03/05/2019   BILITOT 0.5 03/05/2019   GFRNONAA >60 03/05/2019   GFRAA >60 03/05/2019    Lab Results  Component Value Date   CEA1 227.40 (H) 01/23/2019     Medications: I have reviewed the patient's current medications.   Assessment/Plan: 1. Pancreas mass, liver lesions  Abdominal ultrasound 09/03/2018-3 liver lesions. Intra-and extrahepatic biliary ductal dilatation.  Presentation to the emergency department 09/08/2018 with painless jaundice. Initial bilirubin returned at 18.3.   MRI abdomen 09/08/2018-1.9 x 2.6 cm hypoenhancing lesion arising exophytically along the posterior  aspect of the pancreatic head suspicious for pancreatic adenocarcinoma. There was suspected involvement of the distal common duct with secondary intrahepatic/extrahepatic ductal dilatation. Lesion noted to abut the IVC. Multiple liver metastases noted. Small upper abdominal/retroperitoneal lymph nodes noted.   ERCP 09/09/2018-high-grade stricture and obstruction of the distal common bile duct. A metallic biliary stent was placed. Brushings in the mid and lower third of the main bile duct were obtained with no malignant cells identified.   Bilirubin improved at 4.7 on 09/10/2018.   Biopsy of a liver lesion on 09/11/2018-metastatic carcinoma, cytokeratin 7 and TTF-1 positive, negative for cytokeratin 20, CDX 2, and PSA.  CT chest 09/22/2018-right hilar/mediastinal/retrocrural lymphadenopathy, ill-defined right lower lobe nodularity, emphysema  Cycle 1 carboplatin/Alimta/pembrolizumab 10/09/2018  Cycle 2 carboplatin/Alimta/pembrolizumab 10/30/2018  Cycle 3 carboplatin/Alimta/pembrolizumab 11/20/2018  Cycle 4 carboplatin/Alimta/pembrolizumab 12/11/2018  CT 12/26/2018- marked response to therapy of the radical abdominal adenopathy, decreased size of hepatic metastases, no new or progressive disease  Cycle 5 Alimta/pembrolizumab 01/01/2019  Cycle 6 Alimta/pembrolizumab 01/23/2019  Cycle 7 Alimta/pembrolizumab 02/12/2019  Cycle 8  Alimta/pembrolizumab 03/05/2019  Cycle 9  Alimta/pembrolizumab 03/26/2019 2. Biliary obstruction status post stent placement 3. COPD 4. Hypertension 5. Trace edema right leg 12/11/2018. Doppler negative for DVT 12/12/2018. 6. Watery eyes, runny nose reported 12/11/2018.  Question related to Alimta. 7. Admission 12/24/2018 with a fever- no source for infection identified, completed outpatient course of ciprofloxacin     Disposition: Ricky Conway appears stable.  He will complete  another cycle of Alimta/pembrolizumab today.  There is no evidence of a systemic infection  today.  He will call for recurrent chills or any fever.  He will undergo a restaging CT evaluation prior to an office visit in 3 weeks.  [name], MD  03/26/2019  10:13 AM

## 2019-03-26 NOTE — Patient Instructions (Signed)
[facility] Discharge Instructions for Patients Receiving Chemotherapy  Today you received the following chemotherapy agents: Keytruda and Alimta.  To help prevent nausea and vomiting after your treatment, we encourage you to take your nausea medication as prescribed. If you develop nausea and vomiting that is not controlled by your nausea medication, call the clinic.   BELOW ARE SYMPTOMS THAT SHOULD BE REPORTED IMMEDIATELY:  *FEVER GREATER THAN 100.5 F  *CHILLS WITH OR WITHOUT FEVER  NAUSEA AND VOMITING THAT IS NOT CONTROLLED WITH YOUR NAUSEA MEDICATION  *UNUSUAL SHORTNESS OF BREATH  *UNUSUAL BRUISING OR BLEEDING  TENDERNESS IN MOUTH AND THROAT WITH OR WITHOUT PRESENCE OF ULCERS  *URINARY PROBLEMS  *BOWEL PROBLEMS  UNUSUAL RASH Items with * indicate a potential emergency and should be followed up as soon as possible.  Feel free to call the clinic should you have any questions or concerns. The clinic phone number is [phone].  Please show the [facility] at check-in to the Emergency Department and triage nurse.

## 2019-04-12 ENCOUNTER — Other Ambulatory Visit: Payer: Self-pay | Admitting: Oncology

## 2019-04-14 ENCOUNTER — Ambulatory Visit (HOSPITAL_COMMUNITY)
Admission: RE | Admit: 2019-04-14 | Discharge: 2019-04-14 | Disposition: A | Discharge status: Home / Self Care | Payer: 59 | Source: Ambulatory Visit | Attending: Oncology | Admitting: Oncology

## 2019-04-14 ENCOUNTER — Inpatient Hospital Stay: Payer: 59

## 2019-04-14 ENCOUNTER — Encounter (HOSPITAL_COMMUNITY): Payer: Self-pay

## 2019-04-14 ENCOUNTER — Other Ambulatory Visit: Payer: Self-pay

## 2019-04-14 DIAGNOSIS — C3490 Malignant neoplasm of unspecified part of unspecified bronchus or lung: Secondary | ICD-10-CM

## 2019-04-14 DIAGNOSIS — Z5111 Encounter for antineoplastic chemotherapy: Secondary | ICD-10-CM | POA: Diagnosis not present

## 2019-04-14 LAB — CBC WITH DIFFERENTIAL (CANCER CENTER ONLY)
Abs Immature Granulocytes: 0.05 10*3/uL (ref 0.00–0.07)
Basophils Absolute: 0 10*3/uL (ref 0.0–0.1)
Basophils Relative: 0 %
Eosinophils Absolute: 0.2 10*3/uL (ref 0.0–0.5)
Eosinophils Relative: 2 %
HCT: 48.7 % (ref 39.0–52.0)
Hemoglobin: 16.5 g/dL (ref 13.0–17.0)
Immature Granulocytes: 1 %
Lymphocytes Relative: 12 %
Lymphs Abs: 1.3 10*3/uL (ref 0.7–4.0)
MCH: 35.4 pg — ABNORMAL HIGH (ref 26.0–34.0)
MCHC: 33.9 g/dL (ref 30.0–36.0)
MCV: 104.5 fL — ABNORMAL HIGH (ref 80.0–100.0)
Monocytes Absolute: 1.1 10*3/uL — ABNORMAL HIGH (ref 0.1–1.0)
Monocytes Relative: 11 %
Neutro Abs: 8 10*3/uL — ABNORMAL HIGH (ref 1.7–7.7)
Neutrophils Relative %: 74 %
Platelet Count: 313 10*3/uL (ref 150–400)
RBC: 4.66 MIL/uL (ref 4.22–5.81)
RDW: 15.1 % (ref 11.5–15.5)
WBC Count: 10.7 10*3/uL — ABNORMAL HIGH (ref 4.0–10.5)
nRBC: 0 % (ref 0.0–0.2)

## 2019-04-14 LAB — CMP (CANCER CENTER ONLY)
ALT: 32 U/L (ref 0–44)
AST: 37 U/L (ref 15–41)
Albumin: 3.4 g/dL — ABNORMAL LOW (ref 3.5–5.0)
Alkaline Phosphatase: 107 U/L (ref 38–126)
Anion gap: 14 (ref 5–15)
BUN: 9 mg/dL (ref 8–23)
CO2: 28 mmol/L (ref 22–32)
Calcium: 9.2 mg/dL (ref 8.9–10.3)
Chloride: 95 mmol/L — ABNORMAL LOW (ref 98–111)
Creatinine: 0.73 mg/dL (ref 0.61–1.24)
GFR, Est AFR Am: >60 mL/min (ref >60)
GFR, Estimated: >60 mL/min (ref >60)
Glucose, Bld: 94 mg/dL (ref 70–99)
Potassium: 4 mmol/L (ref 3.5–5.1)
Sodium: 137 mmol/L (ref 135–145)
Total Bilirubin: 0.8 mg/dL (ref 0.3–1.2)
Total Protein: 7.2 g/dL (ref 6.5–8.1)

## 2019-04-14 MED ORDER — SODIUM CHLORIDE (PF) 0.9 % IJ SOLN
INTRAMUSCULAR | Status: AC
  Filled 2019-04-14: qty 50

## 2019-04-14 MED ORDER — IOHEXOL 300 MG/ML  SOLN
100.0000 mL | Freq: Once | INTRAMUSCULAR | Status: AC | PRN
  Administered 2019-04-14: 10:00:00 100 mL via INTRAVENOUS

## 2019-04-15 ENCOUNTER — Other Ambulatory Visit: Payer: Self-pay | Admitting: *Deleted

## 2019-04-15 DIAGNOSIS — C3490 Malignant neoplasm of unspecified part of unspecified bronchus or lung: Secondary | ICD-10-CM

## 2019-04-16 ENCOUNTER — Other Ambulatory Visit: Payer: Self-pay

## 2019-04-16 ENCOUNTER — Inpatient Hospital Stay (HOSPITAL_BASED_OUTPATIENT_CLINIC_OR_DEPARTMENT_OTHER): Payer: 59 | Admitting: Nurse Practitioner

## 2019-04-16 ENCOUNTER — Inpatient Hospital Stay: Payer: 59

## 2019-04-16 ENCOUNTER — Encounter: Payer: Self-pay | Admitting: Nurse Practitioner

## 2019-04-16 VITALS — BP 116/63 | HR 87 | Temp 98.2°F | Resp 18 | Ht 70.0 in | Wt 171.8 lb

## 2019-04-16 DIAGNOSIS — C3490 Malignant neoplasm of unspecified part of unspecified bronchus or lung: Secondary | ICD-10-CM

## 2019-04-16 DIAGNOSIS — Z5111 Encounter for antineoplastic chemotherapy: Secondary | ICD-10-CM | POA: Diagnosis not present

## 2019-04-16 LAB — CMP (CANCER CENTER ONLY)
ALT: 30 U/L (ref 0–44)
AST: 34 U/L (ref 15–41)
Albumin: 3.4 g/dL — ABNORMAL LOW (ref 3.5–5.0)
Alkaline Phosphatase: 104 U/L (ref 38–126)
Anion gap: 9 (ref 5–15)
BUN: 11 mg/dL (ref 8–23)
CO2: 28 mmol/L (ref 22–32)
Calcium: 9 mg/dL (ref 8.9–10.3)
Chloride: 100 mmol/L (ref 98–111)
Creatinine: 0.7 mg/dL (ref 0.61–1.24)
GFR, Est AFR Am: >60 mL/min (ref >60)
GFR, Estimated: >60 mL/min (ref >60)
Glucose, Bld: 143 mg/dL — ABNORMAL HIGH (ref 70–99)
Potassium: 3.8 mmol/L (ref 3.5–5.1)
Sodium: 137 mmol/L (ref 135–145)
Total Bilirubin: 0.7 mg/dL (ref 0.3–1.2)
Total Protein: 7.1 g/dL (ref 6.5–8.1)

## 2019-04-16 MED ORDER — PROCHLORPERAZINE MALEATE 10 MG PO TABS
10.0000 mg | ORAL_TABLET | Freq: Once | ORAL | Status: AC
  Administered 2019-04-16: 10 mg via ORAL

## 2019-04-16 MED ORDER — SODIUM CHLORIDE 0.9 % IV SOLN
Freq: Once | INTRAVENOUS | Status: AC
  Administered 2019-04-16: 11:00:00 via INTRAVENOUS
  Filled 2019-04-16: qty 250

## 2019-04-16 MED ORDER — PROCHLORPERAZINE MALEATE 10 MG PO TABS
ORAL_TABLET | ORAL | Status: AC
  Filled 2019-04-16: qty 1

## 2019-04-16 MED ORDER — SODIUM CHLORIDE 0.9 % IV SOLN
520.0000 mg/m2 | Freq: Once | INTRAVENOUS | Status: AC
  Administered 2019-04-16: 1000 mg via INTRAVENOUS
  Filled 2019-04-16: qty 40

## 2019-04-16 MED ORDER — SODIUM CHLORIDE 0.9 % IV SOLN
200.0000 mg | Freq: Once | INTRAVENOUS | Status: AC
  Administered 2019-04-16: 200 mg via INTRAVENOUS
  Filled 2019-04-16: qty 8

## 2019-04-16 NOTE — Progress Notes (Signed)
[place] OFFICE PROGRESS NOTE   Diagnosis: Non-small cell lung cancer  INTERVAL HISTORY:   Ricky Conway returns as scheduled.  He completed another cycle of Alimta/pembrolizumab 03/26/2019.  He denies nausea/vomiting.  No mouth sores.  No diarrhea.  No rash.  No fever or cough.  Overall stable dyspnea on exertion.  He notes more shortness of breath with more extreme exertion.  Eyes have been "watery" for many months.  He thinks he left the scalp lesion is slowly growing.  Objective:  Vital signs in last 24 hours:  Blood pressure 116/63, pulse 87, temperature 98.2 F (36.8 C), temperature source Temporal, resp. rate 18, height 5\' 10"  (1.778 m), weight 171 lb 12.8 oz (77.9 kg), SpO2 90 %.    HEENT: No thrush or ulcers. Resp: Distant breath sounds.  Scattered rhonchi.  No respiratory distress. Cardio: Regular rate and rhythm. GI: Abdomen soft and nontender.  No hepatosplenomegaly. Vascular: Trace edema right lower leg. Neuro: Alert and oriented. Skin: Large brown lesion left scalp, question seborrheic keratosis.   Lab Results:  Lab Results  Component Value Date   WBC 10.7 (H) 04/14/2019   HGB 16.5 04/14/2019   HCT 48.7 04/14/2019   MCV 104.5 (H) 04/14/2019   PLT 313 04/14/2019   NEUTROABS 8.0 (H) 04/14/2019    Imaging:  No results found.  Medications: I have reviewed the patient's current medications.  Assessment/Plan: 1. Pancreas mass, liver lesions  Abdominal ultrasound 09/03/2018-3 liver lesions. Intra-and extrahepatic biliary ductal dilatation.  Presentation to the emergency department 09/08/2018 with painless jaundice. Initial bilirubin returned at 18.3.   MRI abdomen 09/08/2018-1.9 x 2.6 cm hypoenhancing lesion arising exophytically along the posterior aspect of the pancreatic head suspicious for pancreatic adenocarcinoma. There was suspected involvement of the distal common duct with secondary intrahepatic/extrahepatic ductal dilatation.  Lesion noted to abut the IVC. Multiple liver metastases noted. Small upper abdominal/retroperitoneal lymph nodes noted.   ERCP 09/09/2018-high-grade stricture and obstruction of the distal common bile duct. A metallic biliary stent was placed. Brushings in the mid and lower third of the main bile duct were obtained with no malignant cells identified.   Bilirubin improved at 4.7 on 09/10/2018.   Biopsy of a liver lesion on 09/11/2018-metastatic carcinoma, cytokeratin 7 and TTF-1 positive, negative for cytokeratin 20, CDX 2, and PSA.  CT chest 09/22/2018-right hilar/mediastinal/retrocrural lymphadenopathy, ill-defined right lower lobe nodularity, emphysema  Cycle 1 carboplatin/Alimta/pembrolizumab 10/09/2018  Cycle 2 carboplatin/Alimta/pembrolizumab 10/30/2018  Cycle 3 carboplatin/Alimta/pembrolizumab 11/20/2018  Cycle 4 carboplatin/Alimta/pembrolizumab 12/11/2018  CT 12/26/2018- marked response to therapy of the radical abdominal adenopathy, decreased size of hepatic metastases, no new or progressive disease  Cycle 5 Alimta/pembrolizumab 01/01/2019  Cycle 6 Alimta/pembrolizumab 01/23/2019  Cycle 7 Alimta/pembrolizumab 02/12/2019  Cycle 8  Alimta/pembrolizumab 03/05/2019  Cycle 9  Alimta/pembrolizumab 03/26/2019  CTs 04/14/2019-slight interval decrease in size and conspicuity of multiple, irregular low-attenuation liver lesions.  No new liver lesions.  No recurrent mediastinal or hilar lymphadenopathy.  No change in mildly prominent subcentimeter periaortic and peripheral lymph nodes without evidence of recurrent abdominal or pelvic lymphadenopathy.  Cycle 10 Alimta/pembrolizumab 04/16/2019 2. Biliary obstruction status post stent placement 3. COPD 4. Hypertension 5. Trace edema right leg 12/11/2018. Doppler negative for DVT 12/12/2018. 6. Watery eyes, runny nose reported 12/11/2018.  Question related to Alimta.  Persistent watery eyes 04/16/2019.  He will follow-up with ophthalmology. 7.  Admission 12/24/2018 with a fever- no source for infection identified, completed outpatient course of ciprofloxacin   Disposition: Ricky Conway appears unchanged.  He continues Alimta/pembrolizumab.  Recent restaging CTs show further improvement.  Plan to proceed with Alimta/pembrolizumab today as scheduled.  We reviewed the labs from 04/14/2019, adequate to proceed with treatment.  The watery eyes may be related to Alimta.  He will contact his ophthalmologist for evaluation.  He notes a brown lesion at the left scalp is enlarging.  We made a referral to dermatology.  He will return for lab, follow-up, Alimta/pembrolizumab in 3 weeks.  He will contact the office in the interim with any problems.    Ned Card ANP/GNP-BC   04/16/2019  10:53 AM

## 2019-04-16 NOTE — Patient Instructions (Signed)
[facility] Discharge Instructions for Patients Receiving Chemotherapy  Today you received the following chemotherapy agents: Keytruda and Alimta.  To help prevent nausea and vomiting after your treatment, we encourage you to take your nausea medication as prescribed. If you develop nausea and vomiting that is not controlled by your nausea medication, call the clinic.   BELOW ARE SYMPTOMS THAT SHOULD BE REPORTED IMMEDIATELY:  *FEVER GREATER THAN 100.5 F  *CHILLS WITH OR WITHOUT FEVER  NAUSEA AND VOMITING THAT IS NOT CONTROLLED WITH YOUR NAUSEA MEDICATION  *UNUSUAL SHORTNESS OF BREATH  *UNUSUAL BRUISING OR BLEEDING  TENDERNESS IN MOUTH AND THROAT WITH OR WITHOUT PRESENCE OF ULCERS  *URINARY PROBLEMS  *BOWEL PROBLEMS  UNUSUAL RASH Items with * indicate a potential emergency and should be followed up as soon as possible.  Feel free to call the clinic should you have any questions or concerns. The clinic phone number is [phone].  Please show the [place] at check-in to the Emergency Department and triage nurse.

## 2019-04-29 ENCOUNTER — Telehealth: Payer: Self-pay | Admitting: *Deleted

## 2019-04-29 NOTE — Telephone Encounter (Signed)
Provided update as to last OV and next OV and current treatment plan. Unable to work due to fatigue and impaired immune system due to treatment.

## 2019-05-01 ENCOUNTER — Other Ambulatory Visit: Payer: Self-pay | Admitting: Oncology

## 2019-05-07 ENCOUNTER — Inpatient Hospital Stay: Payer: 59

## 2019-05-07 ENCOUNTER — Other Ambulatory Visit: Payer: Self-pay

## 2019-05-07 ENCOUNTER — Inpatient Hospital Stay: Payer: 59 | Attending: Oncology | Admitting: Oncology

## 2019-05-07 VITALS — BP 135/76 | HR 87 | Temp 98.3°F | Resp 17 | Ht 70.0 in | Wt 174.9 lb

## 2019-05-07 DIAGNOSIS — C3490 Malignant neoplasm of unspecified part of unspecified bronchus or lung: Secondary | ICD-10-CM

## 2019-05-07 DIAGNOSIS — Z79899 Other long term (current) drug therapy: Secondary | ICD-10-CM | POA: Diagnosis not present

## 2019-05-07 DIAGNOSIS — C3431 Malignant neoplasm of lower lobe, right bronchus or lung: Secondary | ICD-10-CM | POA: Diagnosis present

## 2019-05-07 DIAGNOSIS — C787 Secondary malignant neoplasm of liver and intrahepatic bile duct: Secondary | ICD-10-CM | POA: Insufficient documentation

## 2019-05-07 DIAGNOSIS — Z5112 Encounter for antineoplastic immunotherapy: Secondary | ICD-10-CM | POA: Diagnosis not present

## 2019-05-07 LAB — CBC WITH DIFFERENTIAL (CANCER CENTER ONLY)
Abs Immature Granulocytes: 0.05 10*3/uL (ref 0.00–0.07)
Basophils Absolute: 0.1 10*3/uL (ref 0.0–0.1)
Basophils Relative: 1 %
Eosinophils Absolute: 0.2 10*3/uL (ref 0.0–0.5)
Eosinophils Relative: 1 %
HCT: 47.8 % (ref 39.0–52.0)
Hemoglobin: 16.3 g/dL (ref 13.0–17.0)
Immature Granulocytes: 1 %
Lymphocytes Relative: 11 %
Lymphs Abs: 1.2 10*3/uL (ref 0.7–4.0)
MCH: 35.5 pg — ABNORMAL HIGH (ref 26.0–34.0)
MCHC: 34.1 g/dL (ref 30.0–36.0)
MCV: 104.1 fL — ABNORMAL HIGH (ref 80.0–100.0)
Monocytes Absolute: 1 10*3/uL (ref 0.1–1.0)
Monocytes Relative: 9 %
Neutro Abs: 8.5 10*3/uL — ABNORMAL HIGH (ref 1.7–7.7)
Neutrophils Relative %: 77 %
Platelet Count: 325 10*3/uL (ref 150–400)
RBC: 4.59 MIL/uL (ref 4.22–5.81)
RDW: 15.1 % (ref 11.5–15.5)
WBC Count: 10.9 10*3/uL — ABNORMAL HIGH (ref 4.0–10.5)
nRBC: 0 % (ref 0.0–0.2)

## 2019-05-07 LAB — CMP (CANCER CENTER ONLY)
ALT: 33 U/L (ref 0–44)
AST: 39 U/L (ref 15–41)
Albumin: 3.4 g/dL — ABNORMAL LOW (ref 3.5–5.0)
Alkaline Phosphatase: 104 U/L (ref 38–126)
Anion gap: 11 (ref 5–15)
BUN: 11 mg/dL (ref 8–23)
CO2: 28 mmol/L (ref 22–32)
Calcium: 9 mg/dL (ref 8.9–10.3)
Chloride: 97 mmol/L — ABNORMAL LOW (ref 98–111)
Creatinine: 0.71 mg/dL (ref 0.61–1.24)
GFR, Est AFR Am: >60 mL/min (ref >60)
GFR, Estimated: >60 mL/min (ref >60)
Glucose, Bld: 131 mg/dL — ABNORMAL HIGH (ref 70–99)
Potassium: 3.9 mmol/L (ref 3.5–5.1)
Sodium: 136 mmol/L (ref 135–145)
Total Bilirubin: 0.7 mg/dL (ref 0.3–1.2)
Total Protein: 7.2 g/dL (ref 6.5–8.1)

## 2019-05-07 LAB — TSH: TSH: 6.173 u[IU]/mL — ABNORMAL HIGH (ref 0.320–4.118)

## 2019-05-07 MED ORDER — SODIUM CHLORIDE 0.9 % IV SOLN
200.0000 mg | Freq: Once | INTRAVENOUS | Status: AC
  Administered 2019-05-07: 200 mg via INTRAVENOUS
  Filled 2019-05-07: qty 8

## 2019-05-07 MED ORDER — SODIUM CHLORIDE 0.9 % IV SOLN
Freq: Once | INTRAVENOUS | Status: AC
  Administered 2019-05-07: 12:00:00 via INTRAVENOUS
  Filled 2019-05-07: qty 250

## 2019-05-07 NOTE — Progress Notes (Signed)
[facility] OFFICE PROGRESS NOTE   Diagnosis: Non-small cell lung cancer  INTERVAL HISTORY:   Ricky Conway returns as scheduled.  He completed another treatment with pembrolizumab and Alimta on 04/16/2019.  He feels well.  No rash or diarrhea.  He continues to have increased tearing.  He has noted partial improvement since beginning an over-the-counter eyedrop.  Objective:  Vital signs in last 24 hours:  Blood pressure 135/76, pulse 87, temperature 98.3 F (36.8 C), temperature source Temporal, resp. rate 17, height 5\' 10"  (1.778 m), weight 174 lb 14.4 oz (79.3 kg), SpO2 90 %.    HEENT: Mild bilateral conjunctival erythema Resp: Distant breath sounds, end inspiratory bronchial sounds at the upper posterior chest bilaterally, no respiratory distress Cardio: Regular rate and rhythm GI: No hepatomegaly Vascular: Trace edema at the right greater than left lower leg with pretibial erythema bilaterally     Lab Results:  Lab Results  Component Value Date   WBC 10.9 (H) 05/07/2019   HGB 16.3 05/07/2019   HCT 47.8 05/07/2019   MCV 104.1 (H) 05/07/2019   PLT 325 05/07/2019   NEUTROABS 8.5 (H) 05/07/2019    CMP  Lab Results  Component Value Date   NA 136 05/07/2019   K 3.9 05/07/2019   CL 97 (L) 05/07/2019   CO2 28 05/07/2019   GLUCOSE 131 (H) 05/07/2019   BUN 11 05/07/2019   CREATININE 0.71 05/07/2019   CALCIUM 9.0 05/07/2019   PROT 7.2 05/07/2019   ALBUMIN 3.4 (L) 05/07/2019   AST 39 05/07/2019   ALT 33 05/07/2019   ALKPHOS 104 05/07/2019   BILITOT 0.7 05/07/2019   GFRNONAA >60 05/07/2019   GFRAA >60 05/07/2019    Lab Results  Component Value Date   CEA1 180.73 (H) 03/26/2019     Medications: I have reviewed the patient's current medications.   Assessment/Plan: 1. Pancreas mass, liver lesions  Abdominal ultrasound 09/03/2018-3 liver lesions. Intra-and extrahepatic biliary ductal dilatation.  Presentation to the emergency department  09/08/2018 with painless jaundice. Initial bilirubin returned at 18.3.   MRI abdomen 09/08/2018-1.9 x 2.6 cm hypoenhancing lesion arising exophytically along the posterior aspect of the pancreatic head suspicious for pancreatic adenocarcinoma. There was suspected involvement of the distal common duct with secondary intrahepatic/extrahepatic ductal dilatation. Lesion noted to abut the IVC. Multiple liver metastases noted. Small upper abdominal/retroperitoneal lymph nodes noted.   ERCP 09/09/2018-high-grade stricture and obstruction of the distal common bile duct. A metallic biliary stent was placed. Brushings in the mid and lower third of the main bile duct were obtained with no malignant cells identified.   Bilirubin improved at 4.7 on 09/10/2018.   Biopsy of a liver lesion on 09/11/2018-metastatic carcinoma, cytokeratin 7 and TTF-1 positive, negative for cytokeratin 20, CDX 2, and PSA.  CT chest 09/22/2018-right hilar/mediastinal/retrocrural lymphadenopathy, ill-defined right lower lobe nodularity, emphysema  Cycle 1 carboplatin/Alimta/pembrolizumab 10/09/2018  Cycle 2 carboplatin/Alimta/pembrolizumab 10/30/2018  Cycle 3 carboplatin/Alimta/pembrolizumab 11/20/2018  Cycle 4 carboplatin/Alimta/pembrolizumab 12/11/2018  CT 12/26/2018- marked response to therapy of the radical abdominal adenopathy, decreased size of hepatic metastases, no new or progressive disease  Cycle 5 Alimta/pembrolizumab 01/01/2019  Cycle 6 Alimta/pembrolizumab 01/23/2019  Cycle 7 Alimta/pembrolizumab 02/12/2019  Cycle 8  Alimta/pembrolizumab 03/05/2019  Cycle 9  Alimta/pembrolizumab 03/26/2019  CTs 04/14/2019-slight interval decrease in size and conspicuity of multiple, irregular low-attenuation liver lesions.  No new liver lesions.  No recurrent mediastinal or hilar lymphadenopathy.  No change in mildly prominent subcentimeter periaortic and peripheral lymph nodes without evidence of recurrent  abdominal or pelvic  lymphadenopathy.  Cycle 10 Alimta/pembrolizumab 04/16/2019  Cycle 11 pembrolizumab 03/07/2019 (Alimta held secondary to increased tearing and leg edema 2. Biliary obstruction status post stent placement 3. COPD 4. Hypertension 5. Trace edema right leg 12/11/2018. Doppler negative for DVT 12/12/2018. 6. Watery eyes, runny nose reported 12/11/2018.  Question related to Alimta.  Persistent watery eyes 04/16/2019.  He will follow-up with ophthalmology. 7. Admission 12/24/2018 with a fever- no source for infection identified, completed outpatient course of ciprofloxacin     Disposition: Ricky Conway will continue treatment with pembrolizumab today.  Alimta will be held secondary to the conjunctivitis and leg swelling.  He will return for an office visit with the plan to resume/pembrolizumab 3 weeks.  I discussed the treatment plan with his wife by telephone.  His performance status has improved since he began treatment in April.  There is no clinical evidence of disease progression.  The plan is to continue systemic therapy.  We will switch to every 6-week pembrolizumab if Alimta is eliminated from the systemic therapy regimen.  [name], MD  05/07/2019  10:19 AM

## 2019-05-07 NOTE — Patient Instructions (Signed)
[facility] Discharge Instructions for Patients Receiving Chemotherapy  Today you received the following chemotherapy agents:  Keytruda.  To help prevent nausea and vomiting after your treatment, we encourage you to take your nausea medication as directed.   If you develop nausea and vomiting that is not controlled by your nausea medication, call the clinic.   BELOW ARE SYMPTOMS THAT SHOULD BE REPORTED IMMEDIATELY:  *FEVER GREATER THAN 100.5 F  *CHILLS WITH OR WITHOUT FEVER  NAUSEA AND VOMITING THAT IS NOT CONTROLLED WITH YOUR NAUSEA MEDICATION  *UNUSUAL SHORTNESS OF BREATH  *UNUSUAL BRUISING OR BLEEDING  TENDERNESS IN MOUTH AND THROAT WITH OR WITHOUT PRESENCE OF ULCERS  *URINARY PROBLEMS  *BOWEL PROBLEMS  UNUSUAL RASH Items with * indicate a potential emergency and should be followed up as soon as possible.  Feel free to call the clinic should you have any questions or concerns. The clinic phone number is [phone].  Please show the CHEMO ALERT CARD at check-in to the Emergency Department and triage nurse.    

## 2019-05-13 ENCOUNTER — Telehealth: Payer: Self-pay | Admitting: *Deleted

## 2019-05-13 NOTE — Telephone Encounter (Signed)
Called patient to f/u on referral to [facility]. He has not heard from them. Called office and they have no record of the referral. Demographics, insurance card, office note and referral order faxed to [phone]. They will then reach out to patient.

## 2019-05-24 ENCOUNTER — Other Ambulatory Visit: Payer: Self-pay | Admitting: Oncology

## 2019-05-28 ENCOUNTER — Ambulatory Visit: Payer: 59 | Admitting: Nutrition

## 2019-05-28 ENCOUNTER — Inpatient Hospital Stay: Payer: 59 | Attending: Oncology | Admitting: Oncology

## 2019-05-28 ENCOUNTER — Other Ambulatory Visit: Payer: Self-pay

## 2019-05-28 ENCOUNTER — Inpatient Hospital Stay: Payer: 59

## 2019-05-28 VITALS — BP 152/83 | HR 83 | Temp 97.8°F | Resp 17 | Ht 70.0 in | Wt 176.3 lb

## 2019-05-28 DIAGNOSIS — Z5112 Encounter for antineoplastic immunotherapy: Secondary | ICD-10-CM | POA: Diagnosis not present

## 2019-05-28 DIAGNOSIS — C3431 Malignant neoplasm of lower lobe, right bronchus or lung: Secondary | ICD-10-CM | POA: Insufficient documentation

## 2019-05-28 DIAGNOSIS — C787 Secondary malignant neoplasm of liver and intrahepatic bile duct: Secondary | ICD-10-CM | POA: Diagnosis present

## 2019-05-28 DIAGNOSIS — C3490 Malignant neoplasm of unspecified part of unspecified bronchus or lung: Secondary | ICD-10-CM

## 2019-05-28 DIAGNOSIS — Z5111 Encounter for antineoplastic chemotherapy: Secondary | ICD-10-CM | POA: Insufficient documentation

## 2019-05-28 DIAGNOSIS — Z79899 Other long term (current) drug therapy: Secondary | ICD-10-CM | POA: Insufficient documentation

## 2019-05-28 LAB — CBC WITH DIFFERENTIAL (CANCER CENTER ONLY)
Abs Immature Granulocytes: 0.03 10*3/uL (ref 0.00–0.07)
Basophils Absolute: 0.1 10*3/uL (ref 0.0–0.1)
Basophils Relative: 1 %
Eosinophils Absolute: 0.2 10*3/uL (ref 0.0–0.5)
Eosinophils Relative: 3 %
HCT: 51 % (ref 39.0–52.0)
Hemoglobin: 17.3 g/dL — ABNORMAL HIGH (ref 13.0–17.0)
Immature Granulocytes: 0 %
Lymphocytes Relative: 15 %
Lymphs Abs: 1.3 10*3/uL (ref 0.7–4.0)
MCH: 34.8 pg — ABNORMAL HIGH (ref 26.0–34.0)
MCHC: 33.9 g/dL (ref 30.0–36.0)
MCV: 102.6 fL — ABNORMAL HIGH (ref 80.0–100.0)
Monocytes Absolute: 0.9 10*3/uL (ref 0.1–1.0)
Monocytes Relative: 10 %
Neutro Abs: 6.4 10*3/uL (ref 1.7–7.7)
Neutrophils Relative %: 71 %
Platelet Count: 231 10*3/uL (ref 150–400)
RBC: 4.97 MIL/uL (ref 4.22–5.81)
RDW: 14.3 % (ref 11.5–15.5)
WBC Count: 9 10*3/uL (ref 4.0–10.5)
nRBC: 0 % (ref 0.0–0.2)

## 2019-05-28 LAB — CMP (CANCER CENTER ONLY)
ALT: 35 U/L (ref 0–44)
AST: 38 U/L (ref 15–41)
Albumin: 3.7 g/dL (ref 3.5–5.0)
Alkaline Phosphatase: 97 U/L (ref 38–126)
Anion gap: 9 (ref 5–15)
BUN: 10 mg/dL (ref 8–23)
CO2: 29 mmol/L (ref 22–32)
Calcium: 9.2 mg/dL (ref 8.9–10.3)
Chloride: 98 mmol/L (ref 98–111)
Creatinine: 0.73 mg/dL (ref 0.61–1.24)
GFR, Est AFR Am: >60 mL/min (ref >60)
GFR, Estimated: >60 mL/min (ref >60)
Glucose, Bld: 92 mg/dL (ref 70–99)
Potassium: 3.9 mmol/L (ref 3.5–5.1)
Sodium: 136 mmol/L (ref 135–145)
Total Bilirubin: 0.7 mg/dL (ref 0.3–1.2)
Total Protein: 7.3 g/dL (ref 6.5–8.1)

## 2019-05-28 LAB — CEA (IN HOUSE-CHCC): CEA (CHCC-In House): 689.03 ng/mL — ABNORMAL HIGH (ref 0.00–5.00)

## 2019-05-28 MED ORDER — PROCHLORPERAZINE MALEATE 10 MG PO TABS
ORAL_TABLET | ORAL | Status: AC
  Filled 2019-05-28: qty 1

## 2019-05-28 MED ORDER — SODIUM CHLORIDE 0.9 % IV SOLN
520.0000 mg/m2 | Freq: Once | INTRAVENOUS | Status: AC
  Administered 2019-05-28: 1000 mg via INTRAVENOUS
  Filled 2019-05-28: qty 40

## 2019-05-28 MED ORDER — CYANOCOBALAMIN 1000 MCG/ML IJ SOLN: 1000.0000 ug | Freq: Once | INTRAMUSCULAR | Status: DC

## 2019-05-28 MED ORDER — SODIUM CHLORIDE 0.9 % IV SOLN
200.0000 mg | Freq: Once | INTRAVENOUS | Status: AC
  Administered 2019-05-28: 200 mg via INTRAVENOUS
  Filled 2019-05-28: qty 8

## 2019-05-28 MED ORDER — PROCHLORPERAZINE MALEATE 10 MG PO TABS
10.0000 mg | ORAL_TABLET | Freq: Once | ORAL | Status: AC
  Administered 2019-05-28: 10 mg via ORAL

## 2019-05-28 MED ORDER — SODIUM CHLORIDE 0.9 % IV SOLN
Freq: Once | INTRAVENOUS | Status: AC
  Administered 2019-05-28: 11:00:00 via INTRAVENOUS
  Filled 2019-05-28: qty 250

## 2019-05-28 NOTE — Progress Notes (Signed)
Patient requested to talk to RD. He wonders why he cannot eat a soft boiled egg during chemotherapy treatment. Educated patient on food safety. Discouraged soft boiled eggs secondary to risk for infection. Offered food safety handout. Patient declined.  Provided contact information.

## 2019-05-28 NOTE — Patient Instructions (Signed)
[place] Discharge Instructions for Patients Receiving Chemotherapy  Today you received the following chemotherapy agents: Keytruda and Alimta.  To help prevent nausea and vomiting after your treatment, we encourage you to take your nausea medication as prescribed. If you develop nausea and vomiting that is not controlled by your nausea medication, call the clinic.   BELOW ARE SYMPTOMS THAT SHOULD BE REPORTED IMMEDIATELY:  *FEVER GREATER THAN 100.5 F  *CHILLS WITH OR WITHOUT FEVER  NAUSEA AND VOMITING THAT IS NOT CONTROLLED WITH YOUR NAUSEA MEDICATION  *UNUSUAL SHORTNESS OF BREATH  *UNUSUAL BRUISING OR BLEEDING  TENDERNESS IN MOUTH AND THROAT WITH OR WITHOUT PRESENCE OF ULCERS  *URINARY PROBLEMS  *BOWEL PROBLEMS  UNUSUAL RASH Items with * indicate a potential emergency and should be followed up as soon as possible.  Feel free to call the clinic should you have any questions or concerns. The clinic phone number is [phone].  Please show the [place] at check-in to the Emergency Department and triage nurse.

## 2019-05-28 NOTE — Progress Notes (Signed)
[place] OFFICE PROGRESS NOTE   Diagnosis: Non-small cell lung cancer  INTERVAL HISTORY:   Ricky Conway completed a cycle of Alimta on 05/07/2019.  No rash or diarrhea.  Stable erythema at the lower leg bilaterally.  Good appetite.  He continues smoking. He was evaluated by ophthalmology.  He was diagnosed with macular degeneration, blepharitis, and ectropion of the left lower lid.  A suture was placed at the left lower leg.  He reports improvement in tearing and erythema at the eyes.  He was referred to a retina specialist for macular degeneration.  Objective:  Vital signs in last 24 hours:  Blood pressure (!) 152/83, pulse 83, temperature 97.8 F (36.6 C), temperature source Temporal, resp. rate 17, height 5\' 10"  (1.778 m), weight 176 lb 4.8 oz (80 kg), SpO2 90 %.    HEENT: Mild erythema of the left conjunctiva GI: Nontender, no hepatosplenomegaly Vascular: Chronic stasis change/trace edema at the pretibial area bilaterally  Skin: Dry skin over the trunk and extremities, no rash    Lab Results:  Lab Results  Component Value Date   WBC 9.0 05/28/2019   HGB 17.3 (H) 05/28/2019   HCT 51.0 05/28/2019   MCV 102.6 (H) 05/28/2019   PLT 231 05/28/2019   NEUTROABS 6.4 05/28/2019    CMP  Lab Results  Component Value Date   NA 136 05/28/2019   K 3.9 05/28/2019   CL 98 05/28/2019   CO2 29 05/28/2019   GLUCOSE 92 05/28/2019   BUN 10 05/28/2019   CREATININE 0.73 05/28/2019   CALCIUM 9.2 05/28/2019   PROT 7.3 05/28/2019   ALBUMIN 3.7 05/28/2019   AST 38 05/28/2019   ALT 35 05/28/2019   ALKPHOS 97 05/28/2019   BILITOT 0.7 05/28/2019   GFRNONAA >60 05/28/2019   GFRAA >60 05/28/2019    Lab Results  Component Value Date   CEA1 180.73 (H) 03/26/2019    Medications: I have reviewed the patient's current medications.   Assessment/Plan: 1. Pancreas mass, liver lesions  Abdominal ultrasound 09/03/2018-3 liver lesions. Intra-and extrahepatic biliary  ductal dilatation.  Presentation to the emergency department 09/08/2018 with painless jaundice. Initial bilirubin returned at 18.3.   MRI abdomen 09/08/2018-1.9 x 2.6 cm hypoenhancing lesion arising exophytically along the posterior aspect of the pancreatic head suspicious for pancreatic adenocarcinoma. There was suspected involvement of the distal common duct with secondary intrahepatic/extrahepatic ductal dilatation. Lesion noted to abut the IVC. Multiple liver metastases noted. Small upper abdominal/retroperitoneal lymph nodes noted.   ERCP 09/09/2018-high-grade stricture and obstruction of the distal common bile duct. A metallic biliary stent was placed. Brushings in the mid and lower third of the main bile duct were obtained with no malignant cells identified.   Bilirubin improved at 4.7 on 09/10/2018.   Biopsy of a liver lesion on 09/11/2018-metastatic carcinoma, cytokeratin 7 and TTF-1 positive, negative for cytokeratin 20, CDX 2, and PSA.  CT chest 09/22/2018-right hilar/mediastinal/retrocrural lymphadenopathy, ill-defined right lower lobe nodularity, emphysema  Cycle 1 carboplatin/Alimta/pembrolizumab 10/09/2018  Cycle 2 carboplatin/Alimta/pembrolizumab 10/30/2018  Cycle 3 carboplatin/Alimta/pembrolizumab 11/20/2018  Cycle 4 carboplatin/Alimta/pembrolizumab 12/11/2018  CT 12/26/2018- marked response to therapy of the radical abdominal adenopathy, decreased size of hepatic metastases, no new or progressive disease  Cycle 5 Alimta/pembrolizumab 01/01/2019  Cycle 6 Alimta/pembrolizumab 01/23/2019  Cycle 7 Alimta/pembrolizumab 02/12/2019  Cycle 8  Alimta/pembrolizumab 03/05/2019  Cycle 9  Alimta/pembrolizumab 03/26/2019  CTs 04/14/2019-slight interval decrease in size and conspicuity of multiple, irregular low-attenuation liver lesions.  No new liver lesions.  No recurrent  mediastinal or hilar lymphadenopathy.  No change in mildly prominent subcentimeter periaortic and peripheral  lymph nodes without evidence of recurrent abdominal or pelvic lymphadenopathy.  Cycle 10 Alimta/pembrolizumab 04/16/2019  Cycle 11 pembrolizumab 05/07/2019 (Alimta held secondary to increased tearing and leg edema)  Cycle 12 Alimta/pembrolizumab 05/28/2019 2. Biliary obstruction status post stent placement 3. COPD 4. Hypertension 5. Trace edema right leg 12/11/2018. Doppler negative for DVT 12/12/2018. 6. Watery eyes, runny nose reported 12/11/2018.  Question related to Alimta.  Persistent watery eyes 04/16/2019.  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 Admission 12/24/2018 with a fever- no source for infection identified, completed outpatient course of ciprofloxacin    Disposition: Ricky Conway appears well.  The changes appear unrelated to the systemic therapy.  He will complete another cycle of Alimta pembrolizumab today.  He will return for an office visit and chemotherapy in 3 weeks.  [name], MD  05/28/2019  10:34 AM

## 2019-05-28 NOTE — Progress Notes (Signed)
After review of office note from Dr. Rance Muir, w/[facility] : OK to treat w/Alimta, same dose.

## 2019-05-29 ENCOUNTER — Telehealth: Payer: Self-pay | Admitting: Oncology

## 2019-05-29 NOTE — Telephone Encounter (Signed)
Scheduled per los. Called and spoke with patients wife. confimed appt

## 2019-06-09 ENCOUNTER — Other Ambulatory Visit: Payer: Self-pay | Admitting: Nurse Practitioner

## 2019-06-09 DIAGNOSIS — K8689 Other specified diseases of pancreas: Secondary | ICD-10-CM

## 2019-06-14 ENCOUNTER — Other Ambulatory Visit: Payer: Self-pay | Admitting: Oncology

## 2019-06-18 ENCOUNTER — Other Ambulatory Visit: Payer: 59

## 2019-06-18 ENCOUNTER — Ambulatory Visit: Payer: 59 | Admitting: Nurse Practitioner

## 2019-06-18 ENCOUNTER — Inpatient Hospital Stay: Payer: 59

## 2019-06-18 ENCOUNTER — Ambulatory Visit: Payer: 59

## 2019-06-18 ENCOUNTER — Other Ambulatory Visit: Payer: Self-pay

## 2019-06-18 ENCOUNTER — Inpatient Hospital Stay (HOSPITAL_BASED_OUTPATIENT_CLINIC_OR_DEPARTMENT_OTHER): Payer: 59 | Admitting: Oncology

## 2019-06-18 VITALS — BP 137/66 | HR 83 | Temp 98.3°F | Resp 18 | Ht 70.0 in | Wt 183.1 lb

## 2019-06-18 DIAGNOSIS — C3490 Malignant neoplasm of unspecified part of unspecified bronchus or lung: Secondary | ICD-10-CM

## 2019-06-18 DIAGNOSIS — Z5111 Encounter for antineoplastic chemotherapy: Secondary | ICD-10-CM | POA: Diagnosis not present

## 2019-06-18 LAB — CBC WITH DIFFERENTIAL (CANCER CENTER ONLY)
Abs Immature Granulocytes: 0.05 10*3/uL (ref 0.00–0.07)
Basophils Absolute: 0.1 10*3/uL (ref 0.0–0.1)
Basophils Relative: 1 %
Eosinophils Absolute: 0.2 10*3/uL (ref 0.0–0.5)
Eosinophils Relative: 2 %
HCT: 48.5 % (ref 39.0–52.0)
Hemoglobin: 16.6 g/dL (ref 13.0–17.0)
Immature Granulocytes: 1 %
Lymphocytes Relative: 11 %
Lymphs Abs: 1 10*3/uL (ref 0.7–4.0)
MCH: 35.3 pg — ABNORMAL HIGH (ref 26.0–34.0)
MCHC: 34.2 g/dL (ref 30.0–36.0)
MCV: 103.2 fL — ABNORMAL HIGH (ref 80.0–100.0)
Monocytes Absolute: 0.9 10*3/uL (ref 0.1–1.0)
Monocytes Relative: 10 %
Neutro Abs: 7.1 10*3/uL (ref 1.7–7.7)
Neutrophils Relative %: 75 %
Platelet Count: 287 10*3/uL (ref 150–400)
RBC: 4.7 MIL/uL (ref 4.22–5.81)
RDW: 14.3 % (ref 11.5–15.5)
WBC Count: 9.3 10*3/uL (ref 4.0–10.5)
nRBC: 0 % (ref 0.0–0.2)

## 2019-06-18 LAB — CMP (CANCER CENTER ONLY)
ALT: 35 U/L (ref 0–44)
AST: 38 U/L (ref 15–41)
Albumin: 3.5 g/dL (ref 3.5–5.0)
Alkaline Phosphatase: 104 U/L (ref 38–126)
Anion gap: 10 (ref 5–15)
BUN: 16 mg/dL (ref 8–23)
CO2: 27 mmol/L (ref 22–32)
Calcium: 8.8 mg/dL — ABNORMAL LOW (ref 8.9–10.3)
Chloride: 100 mmol/L (ref 98–111)
Creatinine: 0.72 mg/dL (ref 0.61–1.24)
GFR, Est AFR Am: >60 mL/min (ref >60)
GFR, Estimated: >60 mL/min (ref >60)
Glucose, Bld: 136 mg/dL — ABNORMAL HIGH (ref 70–99)
Potassium: 4.2 mmol/L (ref 3.5–5.1)
Sodium: 137 mmol/L (ref 135–145)
Total Bilirubin: 0.7 mg/dL (ref 0.3–1.2)
Total Protein: 7.2 g/dL (ref 6.5–8.1)

## 2019-06-18 LAB — TSH: TSH: 11.241 u[IU]/mL — ABNORMAL HIGH (ref 0.320–4.118)

## 2019-06-18 LAB — CEA (IN HOUSE-CHCC): CEA (CHCC-In House): 788.01 ng/mL — ABNORMAL HIGH (ref 0.00–5.00)

## 2019-06-18 MED ORDER — SODIUM CHLORIDE 0.9 % IV SOLN
200.0000 mg | Freq: Once | INTRAVENOUS | Status: AC
  Administered 2019-06-18: 200 mg via INTRAVENOUS
  Filled 2019-06-18: qty 8

## 2019-06-18 MED ORDER — SODIUM CHLORIDE 0.9 % IV SOLN
520.0000 mg/m2 | Freq: Once | INTRAVENOUS | Status: AC
  Administered 2019-06-18: 1000 mg via INTRAVENOUS
  Filled 2019-06-18: qty 40

## 2019-06-18 MED ORDER — PROCHLORPERAZINE MALEATE 10 MG PO TABS
10.0000 mg | ORAL_TABLET | Freq: Once | ORAL | Status: AC
  Administered 2019-06-18: 10 mg via ORAL

## 2019-06-18 MED ORDER — PROCHLORPERAZINE MALEATE 10 MG PO TABS
ORAL_TABLET | ORAL | Status: AC
  Filled 2019-06-18: qty 1

## 2019-06-18 MED ORDER — CYANOCOBALAMIN 1000 MCG/ML IJ SOLN
1000.0000 ug | Freq: Once | INTRAMUSCULAR | Status: AC
  Administered 2019-06-18: 1000 ug via INTRAMUSCULAR

## 2019-06-18 MED ORDER — SODIUM CHLORIDE 0.9 % IV SOLN
Freq: Once | INTRAVENOUS | Status: AC
  Filled 2019-06-18: qty 250

## 2019-06-18 MED ORDER — CYANOCOBALAMIN 1000 MCG/ML IJ SOLN
INTRAMUSCULAR | Status: AC
  Filled 2019-06-18: qty 1

## 2019-06-18 NOTE — Patient Instructions (Signed)
[place] Discharge Instructions for Patients Receiving Chemotherapy  Today you received the following chemotherapy agents :  Pembrolizumab,  Alimta.  To help prevent nausea and vomiting after your treatment, we encourage you to take your nausea medication as prescribed.   If you develop nausea and vomiting that is not controlled by your nausea medication, call the clinic.   BELOW ARE SYMPTOMS THAT SHOULD BE REPORTED IMMEDIATELY:  *FEVER GREATER THAN 100.5 F  *CHILLS WITH OR WITHOUT FEVER  NAUSEA AND VOMITING THAT IS NOT CONTROLLED WITH YOUR NAUSEA MEDICATION  *UNUSUAL SHORTNESS OF BREATH  *UNUSUAL BRUISING OR BLEEDING  TENDERNESS IN MOUTH AND THROAT WITH OR WITHOUT PRESENCE OF ULCERS  *URINARY PROBLEMS  *BOWEL PROBLEMS  UNUSUAL RASH Items with * indicate a potential emergency and should be followed up as soon as possible.  Feel free to call the clinic should you have any questions or concerns. The clinic phone number is [phone].  Please show the [place] at check-in to the Emergency Department and triage nurse.

## 2019-06-18 NOTE — Progress Notes (Signed)
[place] OFFICE PROGRESS NOTE   Diagnosis: Non-small cell lung cancer  INTERVAL HISTORY:   Mr. Chestnutt completed another treatment with Alimta and pembrolizumab on 06/18/2019.  He reports feeling well.  He has waxing and waning leg edema.  No change worsening of conjunctival symptoms or vision.  No rash or diarrhea.  He wears his wife's oxygen at times.  He denies dyspnea.  Objective:  Vital signs in last 24 hours:  Blood pressure 137/66, pulse 83, temperature 98.3 F (36.8 C), temperature source Temporal, resp. rate 18, height 5\' 10"  (1.778 m), weight 183 lb 1.6 oz (83.1 kg), SpO2 92 %.    HEENT: Mild left conjunctival erythema Resp: Distant breath sounds, scattered end inspiratory and expiratory wheeze Cardio: Regular rate and rhythm Vascular: No leg edema  Skin: No rash    Lab Results:  Lab Results  Component Value Date   WBC 9.3 06/18/2019   HGB 16.6 06/18/2019   HCT 48.5 06/18/2019   MCV 103.2 (H) 06/18/2019   PLT 287 06/18/2019   NEUTROABS 7.1 06/18/2019    CMP  Lab Results  Component Value Date   NA 137 06/18/2019   K 4.2 06/18/2019   CL 100 06/18/2019   CO2 27 06/18/2019   GLUCOSE 136 (H) 06/18/2019   BUN 16 06/18/2019   CREATININE 0.72 06/18/2019   CALCIUM 8.8 (L) 06/18/2019   PROT 7.2 06/18/2019   ALBUMIN 3.5 06/18/2019   AST 38 06/18/2019   ALT 35 06/18/2019   ALKPHOS 104 06/18/2019   BILITOT 0.7 06/18/2019   GFRNONAA >60 06/18/2019   GFRAA >60 06/18/2019    Lab Results  Component Value Date   CEA1 788.01 (H) 06/18/2019     Medications: I have reviewed the patient's current medications.   Assessment/Plan: 1. Pancreas mass, liver lesions  Abdominal ultrasound 09/03/2018-3 liver lesions. Intra-and extrahepatic biliary ductal dilatation.  Presentation to the emergency department 09/08/2018 with painless jaundice. Initial bilirubin returned at 18.3.   MRI abdomen 09/08/2018-1.9 x 2.6 cm hypoenhancing lesion arising  exophytically along the posterior aspect of the pancreatic head suspicious for pancreatic adenocarcinoma. There was suspected involvement of the distal common duct with secondary intrahepatic/extrahepatic ductal dilatation. Lesion noted to abut the IVC. Multiple liver metastases noted. Small upper abdominal/retroperitoneal lymph nodes noted.   ERCP 09/09/2018-high-grade stricture and obstruction of the distal common bile duct. A metallic biliary stent was placed. Brushings in the mid and lower third of the main bile duct were obtained with no malignant cells identified.   Bilirubin improved at 4.7 on 09/10/2018.   Biopsy of a liver lesion on 09/11/2018-metastatic carcinoma, cytokeratin 7 and TTF-1 positive, negative for cytokeratin 20, CDX 2, and PSA.  CT chest 09/22/2018-right hilar/mediastinal/retrocrural lymphadenopathy, ill-defined right lower lobe nodularity, emphysema  Cycle 1 carboplatin/Alimta/pembrolizumab 10/09/2018  Cycle 2 carboplatin/Alimta/pembrolizumab 10/30/2018  Cycle 3 carboplatin/Alimta/pembrolizumab 11/20/2018  Cycle 4 carboplatin/Alimta/pembrolizumab 12/11/2018  CT 12/26/2018- marked response to therapy of the radical abdominal adenopathy, decreased size of hepatic metastases, no new or progressive disease  Cycle 5 Alimta/pembrolizumab 01/01/2019  Cycle 6 Alimta/pembrolizumab 01/23/2019  Cycle 7 Alimta/pembrolizumab 02/12/2019  Cycle 8  Alimta/pembrolizumab 03/05/2019  Cycle 9  Alimta/pembrolizumab 03/26/2019  CTs 04/14/2019-slight interval decrease in size and conspicuity of multiple, irregular low-attenuation liver lesions.  No new liver lesions.  No recurrent mediastinal or hilar lymphadenopathy.  No change in mildly prominent subcentimeter periaortic and peripheral lymph nodes without evidence of recurrent abdominal or pelvic lymphadenopathy.  Cycle 10 Alimta/pembrolizumab 04/16/2019  Cycle 11 pembrolizumab 05/07/2019 (  Alimta held secondary to increased tearing and  leg edema)  Cycle 12 Alimta/pembrolizumab 05/28/2019  Cycle 13 Alimta/pembrolizumab 06/18/2019 2. Biliary obstruction status post stent placement 3. COPD 4. Hypertension 5. Trace edema right leg 12/11/2018. Doppler negative for DVT 12/12/2018. 6. Watery eyes, runny nose reported 12/11/2018.  Question related to Alimta.  Persistent watery eyes 04/16/2019.  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 Admission 12/24/2018 with a fever- no source for infection identified, completed outpatient course of ciprofloxacin      Disposition: Ricky Conway appears unchanged.  He will complete another treatment with Alimta and pembrolizumab today.  He will undergo restaging CTs prior to a return visit in 3 weeks.  The CEA is higher, but there is no clinical evidence of disease progression. The TSH is mildly elevated.  We will check a T4 level today.  Repeat a thyroid panel when he is here in 3 weeks.  [name], MD  06/18/2019  1:51 PM

## 2019-06-23 LAB — T4: T4, Total: 4.2 ug/dL — ABNORMAL LOW (ref 4.5–12.0)

## 2019-06-24 ENCOUNTER — Other Ambulatory Visit: Payer: Self-pay

## 2019-06-24 ENCOUNTER — Telehealth: Payer: Self-pay

## 2019-06-24 MED ORDER — LEVOTHYROXINE SODIUM 50 MCG PO TABS: 50.0000 ug | ORAL_TABLET | Freq: Every day | ORAL | 3 refills | Status: DC

## 2019-06-24 NOTE — Progress Notes (Signed)
Sent in Synthroid medication for patient to [facility]

## 2019-06-24 NOTE — Telephone Encounter (Signed)
TC to patient per Dr Benay Spice to let him know his thyroid function is low, likely a side effect of the pembrolizumab  I let him know he needs to Start Synthroid 50 mcg daily (medication sent into [facility] pharmacy as requested by patient) and he  will repeat thyroid function studies in approximately 6 weeks. Patient verbalized understanding. No further problems or concerns at this time.

## 2019-07-05 ENCOUNTER — Other Ambulatory Visit: Payer: Self-pay | Admitting: Oncology

## 2019-07-08 ENCOUNTER — Ambulatory Visit (HOSPITAL_COMMUNITY)
Admission: RE | Admit: 2019-07-08 | Discharge: 2019-07-08 | Disposition: A | Discharge status: Home / Self Care | Payer: BC Managed Care – PPO | Source: Ambulatory Visit | Attending: Oncology | Admitting: Oncology

## 2019-07-08 ENCOUNTER — Other Ambulatory Visit: Payer: Self-pay

## 2019-07-08 DIAGNOSIS — C3490 Malignant neoplasm of unspecified part of unspecified bronchus or lung: Secondary | ICD-10-CM | POA: Diagnosis not present

## 2019-07-08 MED ORDER — IOHEXOL 300 MG/ML  SOLN
100.0000 mL | Freq: Once | INTRAMUSCULAR | Status: AC | PRN
  Administered 2019-07-08: 12:00:00 100 mL via INTRAVENOUS

## 2019-07-08 MED ORDER — SODIUM CHLORIDE (PF) 0.9 % IJ SOLN
INTRAMUSCULAR | Status: AC
  Filled 2019-07-08: qty 50

## 2019-07-09 ENCOUNTER — Inpatient Hospital Stay (HOSPITAL_BASED_OUTPATIENT_CLINIC_OR_DEPARTMENT_OTHER): Payer: BC Managed Care – PPO | Admitting: Nurse Practitioner

## 2019-07-09 ENCOUNTER — Inpatient Hospital Stay: Payer: BC Managed Care – PPO | Attending: Oncology

## 2019-07-09 ENCOUNTER — Encounter: Payer: Self-pay | Admitting: Nurse Practitioner

## 2019-07-09 ENCOUNTER — Inpatient Hospital Stay: Payer: BC Managed Care – PPO

## 2019-07-09 ENCOUNTER — Other Ambulatory Visit: Payer: Self-pay

## 2019-07-09 VITALS — BP 134/73 | HR 83 | Temp 98.3°F | Resp 17 | Ht 70.0 in | Wt 179.0 lb

## 2019-07-09 DIAGNOSIS — C3431 Malignant neoplasm of lower lobe, right bronchus or lung: Secondary | ICD-10-CM | POA: Diagnosis present

## 2019-07-09 DIAGNOSIS — Z5111 Encounter for antineoplastic chemotherapy: Secondary | ICD-10-CM | POA: Insufficient documentation

## 2019-07-09 DIAGNOSIS — Z79899 Other long term (current) drug therapy: Secondary | ICD-10-CM | POA: Insufficient documentation

## 2019-07-09 DIAGNOSIS — C3490 Malignant neoplasm of unspecified part of unspecified bronchus or lung: Secondary | ICD-10-CM

## 2019-07-09 DIAGNOSIS — C787 Secondary malignant neoplasm of liver and intrahepatic bile duct: Secondary | ICD-10-CM | POA: Insufficient documentation

## 2019-07-09 LAB — CBC WITH DIFFERENTIAL (CANCER CENTER ONLY)
Abs Immature Granulocytes: 0.05 10*3/uL (ref 0.00–0.07)
Basophils Absolute: 0.1 10*3/uL (ref 0.0–0.1)
Basophils Relative: 1 %
Eosinophils Absolute: 0.2 10*3/uL (ref 0.0–0.5)
Eosinophils Relative: 2 %
HCT: 48.1 % (ref 39.0–52.0)
Hemoglobin: 16.6 g/dL (ref 13.0–17.0)
Immature Granulocytes: 0 %
Lymphocytes Relative: 10 %
Lymphs Abs: 1.2 10*3/uL (ref 0.7–4.0)
MCH: 35.5 pg — ABNORMAL HIGH (ref 26.0–34.0)
MCHC: 34.5 g/dL (ref 30.0–36.0)
MCV: 103 fL — ABNORMAL HIGH (ref 80.0–100.0)
Monocytes Absolute: 1.3 10*3/uL — ABNORMAL HIGH (ref 0.1–1.0)
Monocytes Relative: 11 %
Neutro Abs: 9.1 10*3/uL — ABNORMAL HIGH (ref 1.7–7.7)
Neutrophils Relative %: 76 %
Platelet Count: 317 10*3/uL (ref 150–400)
RBC: 4.67 MIL/uL (ref 4.22–5.81)
RDW: 14.4 % (ref 11.5–15.5)
WBC Count: 11.9 10*3/uL — ABNORMAL HIGH (ref 4.0–10.5)
nRBC: 0 % (ref 0.0–0.2)

## 2019-07-09 LAB — CMP (CANCER CENTER ONLY)
ALT: 35 U/L (ref 0–44)
AST: 38 U/L (ref 15–41)
Albumin: 3.5 g/dL (ref 3.5–5.0)
Alkaline Phosphatase: 111 U/L (ref 38–126)
Anion gap: 11 (ref 5–15)
BUN: 14 mg/dL (ref 8–23)
CO2: 29 mmol/L (ref 22–32)
Calcium: 8.6 mg/dL — ABNORMAL LOW (ref 8.9–10.3)
Chloride: 97 mmol/L — ABNORMAL LOW (ref 98–111)
Creatinine: 0.73 mg/dL (ref 0.61–1.24)
GFR, Est AFR Am: >60 mL/min (ref >60)
GFR, Estimated: >60 mL/min (ref >60)
Glucose, Bld: 99 mg/dL (ref 70–99)
Potassium: 4 mmol/L (ref 3.5–5.1)
Sodium: 137 mmol/L (ref 135–145)
Total Bilirubin: 0.5 mg/dL (ref 0.3–1.2)
Total Protein: 7.1 g/dL (ref 6.5–8.1)

## 2019-07-09 LAB — CEA (IN HOUSE-CHCC): CEA (CHCC-In House): 1340.41 ng/mL — ABNORMAL HIGH (ref 0.00–5.00)

## 2019-07-09 MED ORDER — SODIUM CHLORIDE 0.9 % IV SOLN
Freq: Once | INTRAVENOUS | Status: AC
  Filled 2019-07-09: qty 250

## 2019-07-09 MED ORDER — PROCHLORPERAZINE MALEATE 10 MG PO TABS
ORAL_TABLET | ORAL | Status: AC
  Filled 2019-07-09: qty 1

## 2019-07-09 MED ORDER — CYANOCOBALAMIN 1000 MCG/ML IJ SOLN
1000.0000 ug | Freq: Once | INTRAMUSCULAR | Status: AC
  Administered 2019-07-09: 1000 ug via INTRAMUSCULAR

## 2019-07-09 MED ORDER — CYANOCOBALAMIN 1000 MCG/ML IJ SOLN
INTRAMUSCULAR | Status: AC
  Filled 2019-07-09: qty 1

## 2019-07-09 MED ORDER — SODIUM CHLORIDE 0.9 % IV SOLN
200.0000 mg | Freq: Once | INTRAVENOUS | Status: AC
  Administered 2019-07-09: 200 mg via INTRAVENOUS
  Filled 2019-07-09: qty 8

## 2019-07-09 MED ORDER — SODIUM CHLORIDE 0.9 % IV SOLN
500.0000 mg/m2 | Freq: Once | INTRAVENOUS | Status: AC
  Administered 2019-07-09: 1000 mg via INTRAVENOUS
  Filled 2019-07-09: qty 40

## 2019-07-09 MED ORDER — PROCHLORPERAZINE MALEATE 10 MG PO TABS
10.0000 mg | ORAL_TABLET | Freq: Once | ORAL | Status: AC
  Administered 2019-07-09: 10 mg via ORAL

## 2019-07-09 NOTE — Progress Notes (Addendum)
[place] OFFICE PROGRESS NOTE   Diagnosis: Non-small cell lung cancer  INTERVAL HISTORY:   Ricky Conway returns as scheduled.  He completed a cycle of Alimta/pembrolizumab 06/18/2019.  He denies nausea/vomiting.  No diarrhea.  No rash.  He thinks his breathing may be a little worse.  Objective:  Vital signs in last 24 hours:  Blood pressure 134/73, pulse 83, temperature 98.3 F (36.8 C), temperature source Temporal, resp. rate 17, height 5\' 10"  (1.778 m), weight 179 lb (81.2 kg), SpO2 90 %.    HEENT: No thrush or ulcers.  Mild left conjunctival erythema. Resp: Distant breath sounds.  No respiratory distress. GI: Abdomen soft and nontender.  No hepatomegaly. Vascular: No leg edema. Skin: No rash.   Lab Results:  Lab Results  Component Value Date   WBC 11.9 (H) 07/09/2019   HGB 16.6 07/09/2019   HCT 48.1 07/09/2019   MCV 103.0 (H) 07/09/2019   PLT 317 07/09/2019   NEUTROABS 9.1 (H) 07/09/2019    Imaging:  CT Chest W Contrast  Result Date: 07/08/2019 CLINICAL DATA:  Restaging lung cancer. EXAM: CT CHEST, ABDOMEN, AND PELVIS WITH CONTRAST TECHNIQUE: Multidetector CT imaging of the chest, abdomen and pelvis was performed following the standard protocol during bolus administration of intravenous contrast. CONTRAST:  126mL OMNIPAQUE IOHEXOL 300 MG/ML  SOLN COMPARISON:  04/14/2019 FINDINGS: CT CHEST FINDINGS Cardiovascular: Calcified atherosclerotic changes throughout the thoracic aorta. Signs of coronary artery disease with extensive coronary artery calcifications. Heart size is stable without pericardial effusion. Central pulmonary vasculature mildly engorged. No change prior study. Mediastinum/Nodes: No signs of thoracic inlet or of axillary lymphadenopathy. No mediastinal lymphadenopathy or hilar lymphadenopathy. Lungs/Pleura: Signs of marked pulmonary emphysema worse towards the lung apices similar to prior exam. Small perifissural nodule likely subpleural lymph  node in the right chest unchanged. Small peripheral nodule in the lingula (image 125, series 6) 6 mm not changed from prior exam. No signs of dense consolidation or evidence of pleural effusion. Signs of right middle lobe scarring are similar to the previous study. Airways are patent. Musculoskeletal: No chest wall mass. CT ABDOMEN PELVIS FINDINGS Hepatobiliary: Multiple areas of decreased attenuation in the liver. Left hepatic lobe lesion (image 54, series 2) 3.3 cm. Previously 2.2 cm Lesion in the medial segment of the left hepatic lobe is stable at approximately 1.1 cm (image 52, series 2) Small lesion along the gallbladder fossa 0 (image 64, series 2) 1.4 cm, previously not well seen perhaps 6 mm. Increasing size of lobulated low-attenuation on the posterior aspect of the left lateral section (image 55, series 2) 2.3 cm, previously 1.5 cm. More confluent areas of low attenuation occupy a wedge-shaped area in the right hepatic periphery (image 54, series 2, within this area there are more defined areas of low attenuation showing rounded margins most notably on image 52 of series 2 measuring 1.4 cm along the periphery the right hepatic lobe. Biliary stent in place with small amounts of pneumobilia. Portal vein is patent. Pancreas: Pancreas without ductal dilation. Spleen: Spleen is normal. Adrenals/Urinary Tract: Adrenal glands are normal. Symmetric renal enhancement. No signs of hydronephrosis. Urinary bladder is unremarkable. Stomach/Bowel: No signs of bowel obstruction or acute bowel process. Normal appendix. Colonic diverticulosis. Vascular/Lymphatic: 3.2 x 3.1 cm infrarenal abdominal aortic aneurysm similar to previous studies. No signs of retroperitoneal adenopathy or upper abdominal lymphadenopathy. There are numerous small lymph nodes in the retroperitoneum while less than a cm are slightly more conspicuous than on prior exams. No signs  of pelvic lymphadenopathy. Reproductive: Prostatomegaly as before.  Other: No signs of free air, focal fluid or significant hernia. Musculoskeletal: Spinal degenerative changes. No acute or destructive bone process. IMPRESSION: 1. Worsening of low-density hepatic lesions. Note that on delayed phase imaging there are numerous small lesions which are apparent in the area of wedge-shaped hypoattenuation in the right hepatic lobe. 2. Given wedge-shaped appearance of changes in the right hepatic lobe superimposed changes of low level cholangitis is considered. Clinical correlation is suggested. 3. Scattered small lymph nodes with increasing conspicuity, largest along the left periaortic chain approximately 9 mm previously approximately 5 mm (image 75, series 2) attention on follow-up. Electronically Signed   By: Zetta Bills M.D.   On: 07/08/2019 13:04   CT Abdomen Pelvis W Contrast  Result Date: 07/08/2019 CLINICAL DATA:  Restaging lung cancer. EXAM: CT CHEST, ABDOMEN, AND PELVIS WITH CONTRAST TECHNIQUE: Multidetector CT imaging of the chest, abdomen and pelvis was performed following the standard protocol during bolus administration of intravenous contrast. CONTRAST:  139mL OMNIPAQUE IOHEXOL 300 MG/ML  SOLN COMPARISON:  04/14/2019 FINDINGS: CT CHEST FINDINGS Cardiovascular: Calcified atherosclerotic changes throughout the thoracic aorta. Signs of coronary artery disease with extensive coronary artery calcifications. Heart size is stable without pericardial effusion. Central pulmonary vasculature mildly engorged. No change prior study. Mediastinum/Nodes: No signs of thoracic inlet or of axillary lymphadenopathy. No mediastinal lymphadenopathy or hilar lymphadenopathy. Lungs/Pleura: Signs of marked pulmonary emphysema worse towards the lung apices similar to prior exam. Small perifissural nodule likely subpleural lymph node in the right chest unchanged. Small peripheral nodule in the lingula (image 125, series 6) 6 mm not changed from prior exam. No signs of dense consolidation or  evidence of pleural effusion. Signs of right middle lobe scarring are similar to the previous study. Airways are patent. Musculoskeletal: No chest wall mass. CT ABDOMEN PELVIS FINDINGS Hepatobiliary: Multiple areas of decreased attenuation in the liver. Left hepatic lobe lesion (image 54, series 2) 3.3 cm. Previously 2.2 cm Lesion in the medial segment of the left hepatic lobe is stable at approximately 1.1 cm (image 52, series 2) Small lesion along the gallbladder fossa 0 (image 64, series 2) 1.4 cm, previously not well seen perhaps 6 mm. Increasing size of lobulated low-attenuation on the posterior aspect of the left lateral section (image 55, series 2) 2.3 cm, previously 1.5 cm. More confluent areas of low attenuation occupy a wedge-shaped area in the right hepatic periphery (image 54, series 2, within this area there are more defined areas of low attenuation showing rounded margins most notably on image 52 of series 2 measuring 1.4 cm along the periphery the right hepatic lobe. Biliary stent in place with small amounts of pneumobilia. Portal vein is patent. Pancreas: Pancreas without ductal dilation. Spleen: Spleen is normal. Adrenals/Urinary Tract: Adrenal glands are normal. Symmetric renal enhancement. No signs of hydronephrosis. Urinary bladder is unremarkable. Stomach/Bowel: No signs of bowel obstruction or acute bowel process. Normal appendix. Colonic diverticulosis. Vascular/Lymphatic: 3.2 x 3.1 cm infrarenal abdominal aortic aneurysm similar to previous studies. No signs of retroperitoneal adenopathy or upper abdominal lymphadenopathy. There are numerous small lymph nodes in the retroperitoneum while less than a cm are slightly more conspicuous than on prior exams. No signs of pelvic lymphadenopathy. Reproductive: Prostatomegaly as before. Other: No signs of free air, focal fluid or significant hernia. Musculoskeletal: Spinal degenerative changes. No acute or destructive bone process. IMPRESSION: 1.  Worsening of low-density hepatic lesions. Note that on delayed phase imaging there are numerous small lesions  which are apparent in the area of wedge-shaped hypoattenuation in the right hepatic lobe. 2. Given wedge-shaped appearance of changes in the right hepatic lobe superimposed changes of low level cholangitis is considered. Clinical correlation is suggested. 3. Scattered small lymph nodes with increasing conspicuity, largest along the left periaortic chain approximately 9 mm previously approximately 5 mm (image 75, series 2) attention on follow-up. Electronically Signed   By: Zetta Bills M.D.   On: 07/08/2019 13:04    Medications: I have reviewed the patient's current medications.  Assessment/Plan: 1. Pancreas mass, liver lesions  Abdominal ultrasound 09/03/2018-3 liver lesions. Intra-and extrahepatic biliary ductal dilatation.  Presentation to the emergency department 09/08/2018 with painless jaundice. Initial bilirubin returned at 18.3.   MRI abdomen 09/08/2018-1.9 x 2.6 cm hypoenhancing lesion arising exophytically along the posterior aspect of the pancreatic head suspicious for pancreatic adenocarcinoma. There was suspected involvement of the distal common duct with secondary intrahepatic/extrahepatic ductal dilatation. Lesion noted to abut the IVC. Multiple liver metastases noted. Small upper abdominal/retroperitoneal lymph nodes noted.   ERCP 09/09/2018-high-grade stricture and obstruction of the distal common bile duct. A metallic biliary stent was placed. Brushings in the mid and lower third of the main bile duct were obtained with no malignant cells identified.   Bilirubin improved at 4.7 on 09/10/2018.   Biopsy of a liver lesion on 09/11/2018-metastatic carcinoma, cytokeratin 7 and TTF-1 positive, negative for cytokeratin 20, CDX 2, and PSA.  CT chest 09/22/2018-right hilar/mediastinal/retrocrural lymphadenopathy, ill-defined right lower lobe nodularity, emphysema  Cycle  1 carboplatin/Alimta/pembrolizumab 10/09/2018  Cycle 2 carboplatin/Alimta/pembrolizumab 10/30/2018  Cycle 3 carboplatin/Alimta/pembrolizumab 11/20/2018  Cycle 4 carboplatin/Alimta/pembrolizumab 12/11/2018  CT 12/26/2018-marked response to therapy of the radical abdominal adenopathy, decreased size of hepatic metastases, no new or progressive disease  Cycle 5 Alimta/pembrolizumab 01/01/2019  Cycle 6 Alimta/pembrolizumab 01/23/2019  Cycle 7 Alimta/pembrolizumab 02/12/2019  Cycle 8 Alimta/pembrolizumab 03/05/2019  Cycle 9 Alimta/pembrolizumab 03/26/2019  CTs 04/14/2019-slight interval decrease in size and conspicuity of multiple, irregular low-attenuation liver lesions.  No new liver lesions.  No recurrent mediastinal or hilar lymphadenopathy.  No change in mildly prominent subcentimeter periaortic and peripheral lymph nodes without evidence of recurrent abdominal or pelvic lymphadenopathy.  Cycle 10 Alimta/pembrolizumab 04/16/2019  Cycle 11 pembrolizumab 05/07/2019 (Alimta held secondary to increased tearing and leg edema)  Cycle 12 Alimta/pembrolizumab 05/28/2019  Cycle 13 Alimta/pembrolizumab 06/18/2019  Restaging CTs 07/08/2019-worsening of low-density hepatic lesions.  Scattered small lymph nodes with increasing conspicuity, largest along the left periaortic chain approximately 9 mm previously approximately 5 mm.  Cycle 14 Alimta/pembrolizumab 07/09/2019 2. Biliary obstruction status post stent placement 3. COPD 4. Hypertension 5. Trace edema right leg 12/11/2018. Doppler negative for DVT 12/12/2018. 6. Watery eyes, runny nose reported 12/11/2018. Question related to Alimta.  Persistent watery eyes 04/16/2019.  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 Admission 12/24/2018 with a fever-no source for infection identified, completed outpatient course of ciprofloxacin   Disposition: Ricky Conway appears unchanged.  He has completed 13  cycles of Alimta/pembrolizumab.  Restaging CTs from yesterday show mild worsening of a few liver lesions.  We reviewed the report and images with Ricky Conway at today's visit.  Dr. Benay Spice reviewed options to include continuation of Alimta/pembrolizumab versus a treatment break versus changing treatment to Taxotere/Cyramza.  The plan is to continue Alimta/pembrolizumab for 3 cycles and then repeat CT scans.  Ricky Conway is in agreement.  We will proceed with cycle 14 Alimta/pembrolizumab today as scheduled.  We reviewed the CBC and chemistry panel from today.  Both are adequate to proceed with treatment.  He will return for lab, follow-up, Alimta/pembrolizumab in 3 weeks.  He will contact the office in the interim with any problems.  Patient seen with Dr. Benay Spice.    Ricky Conway ANP/GNP-BC   07/09/2019  12:06 PM This was a shared visit with Ricky Conway.  We reviewed the restaging CT images with Ricky Conway.  There has been slight progression in the liver compared to the most recent CTs, but the liver tumor burden remains significantly improved compared to the baseline CT.  The CEA is higher, but remains lower than when he began treatment with the current regimen.  He is tolerating treatment well and maintains a good performance status.  We reviewed options with Ricky Conway.  We decided to continue the current treatment with close follow-up of the CEA and the plan to schedule a repeat CT in 2 months.  We will consider switching to docetaxel/ramucirumab when there is further evidence of disease progression.  Ricky Manson, MD

## 2019-07-09 NOTE — Patient Instructions (Signed)
[facility] Discharge Instructions for Patients Receiving Chemotherapy  Today you received the following chemotherapy agents: Pembrolizumab (Keytruda) and Pemetrexed (Alimta)  To help prevent nausea and vomiting after your treatment, we encourage you to take your nausea medication as directed.   If you develop nausea and vomiting that is not controlled by your nausea medication, call the clinic.   BELOW ARE SYMPTOMS THAT SHOULD BE REPORTED IMMEDIATELY:  *FEVER GREATER THAN 100.5 F  *CHILLS WITH OR WITHOUT FEVER  NAUSEA AND VOMITING THAT IS NOT CONTROLLED WITH YOUR NAUSEA MEDICATION  *UNUSUAL SHORTNESS OF BREATH  *UNUSUAL BRUISING OR BLEEDING  TENDERNESS IN MOUTH AND THROAT WITH OR WITHOUT PRESENCE OF ULCERS  *URINARY PROBLEMS  *BOWEL PROBLEMS  UNUSUAL RASH Items with * indicate a potential emergency and should be followed up as soon as possible.  Feel free to call the clinic should you have any questions or concerns. The clinic phone number is [phone].  Please show the [facility] at check-in to the Emergency Department and triage nurse.  Coronavirus (COVID-19) Are you at risk?  Are you at risk for the Coronavirus (COVID-19)?  To be considered HIGH RISK for Coronavirus (COVID-19), you have to meet the following criteria:  . Traveled to [place], [place], [place], [place] or [place]; or in the [place] to [place], [place], [place], or [place]; and have fever, cough, and shortness of breath within the last 2 weeks of travel OR . Been in close contact with a person diagnosed with COVID-19 within the last 2 weeks and have fever, cough, and shortness of breath . IF YOU DO NOT MEET THESE CRITERIA, YOU ARE CONSIDERED LOW RISK FOR COVID-19.  What to do if you are HIGH RISK for COVID-19?  Marland Kitchen If you are having a medical emergency, call 911. . Seek medical care right away. Before you go to a doctor's office, urgent care or emergency department,  call ahead and tell them about your recent travel, contact with someone diagnosed with COVID-19, and your symptoms. You should receive instructions from your physician's office regarding next steps of care.  . When you arrive at healthcare provider, tell the healthcare staff immediately you have returned from visiting [place], [place], [place], [place] or [place]; or traveled in the [place] to [place], [place], [place], or [place]; in the last two weeks or you have been in close contact with a person diagnosed with COVID-19 in the last 2 weeks.   . Tell the health care staff about your symptoms: fever, cough and shortness of breath. . After you have been seen by a medical provider, you will be either: o Tested for (COVID-19) and discharged home on quarantine except to seek medical care if symptoms worsen, and asked to  - Stay home and avoid contact with others until you get your results (4-5 days)  - Avoid travel on public transportation if possible (such as bus, train, or airplane) or o Sent to the Emergency Department by EMS for evaluation, COVID-19 testing, and possible admission depending on your condition and test results.  What to do if you are LOW RISK for COVID-19?  Reduce your risk of any infection by using the same precautions used for avoiding the common cold or flu:  Marland Kitchen Wash your hands often with soap and warm water for at least 20 seconds.  If soap and water are not readily available, use an alcohol-based hand sanitizer with at least 60% alcohol.  Marland Kitchen  If coughing or sneezing, cover your mouth and nose by coughing or sneezing into the elbow areas of your shirt or coat, into a tissue or into your sleeve (not your hands). . Avoid shaking hands with others and consider head nods or verbal greetings only. . Avoid touching your eyes, nose, or mouth with unwashed hands.  . Avoid close contact with people who are sick. . Avoid places or events with large numbers of people in one  location, like concerts or sporting events. . Carefully consider travel plans you have or are making. . If you are planning any travel outside or inside the [place], visit the CDC's Travelers' Health webpage for the latest health notices. . If you have some symptoms but not all symptoms, continue to monitor at home and seek medical attention if your symptoms worsen. . If you are having a medical emergency, call 911.   [place] / e-Visit: [email or web address]         [facility]: [facility]: [phone]                   [facility]: [phone]

## 2019-07-10 LAB — THYROID PANEL WITH TSH
Free Thyroxine Index: 2.3 (ref 1.2–4.9)
T3 Uptake Ratio: 35 % (ref 24–39)
T4, Total: 6.7 ug/dL (ref 4.5–12.0)
TSH: 8.98 u[IU]/mL — ABNORMAL HIGH (ref 0.450–4.500)

## 2019-07-16 ENCOUNTER — Telehealth: Payer: Self-pay | Admitting: Oncology

## 2019-07-16 NOTE — Telephone Encounter (Signed)
Called patient per providers request, appointment has moved to 02/05. Called and spoke with patient's wife, he will be notified.

## 2019-07-22 ENCOUNTER — Telehealth: Payer: Self-pay | Admitting: *Deleted

## 2019-07-22 NOTE — Telephone Encounter (Signed)
Called back and provided update on CT scan and current treatment plan as requested.

## 2019-07-26 ENCOUNTER — Other Ambulatory Visit: Payer: Self-pay | Admitting: Oncology

## 2019-07-30 ENCOUNTER — Ambulatory Visit: Payer: Medicare Other | Admitting: Oncology

## 2019-07-30 ENCOUNTER — Other Ambulatory Visit: Payer: Medicare Other

## 2019-07-30 ENCOUNTER — Ambulatory Visit: Payer: Medicare Other

## 2019-07-31 ENCOUNTER — Inpatient Hospital Stay: Payer: BC Managed Care – PPO

## 2019-07-31 ENCOUNTER — Inpatient Hospital Stay: Payer: BC Managed Care – PPO | Attending: Oncology

## 2019-07-31 ENCOUNTER — Inpatient Hospital Stay (HOSPITAL_BASED_OUTPATIENT_CLINIC_OR_DEPARTMENT_OTHER): Payer: BC Managed Care – PPO | Admitting: Nurse Practitioner

## 2019-07-31 ENCOUNTER — Other Ambulatory Visit: Payer: Self-pay | Admitting: Nurse Practitioner

## 2019-07-31 ENCOUNTER — Encounter: Payer: Self-pay | Admitting: Nurse Practitioner

## 2019-07-31 ENCOUNTER — Other Ambulatory Visit: Payer: Self-pay

## 2019-07-31 VITALS — BP 157/94 | HR 84 | Temp 98.2°F | Resp 18 | Ht 70.0 in | Wt 182.2 lb

## 2019-07-31 DIAGNOSIS — C3490 Malignant neoplasm of unspecified part of unspecified bronchus or lung: Secondary | ICD-10-CM

## 2019-07-31 DIAGNOSIS — C3431 Malignant neoplasm of lower lobe, right bronchus or lung: Secondary | ICD-10-CM | POA: Insufficient documentation

## 2019-07-31 DIAGNOSIS — Z79899 Other long term (current) drug therapy: Secondary | ICD-10-CM | POA: Diagnosis not present

## 2019-07-31 DIAGNOSIS — Z5111 Encounter for antineoplastic chemotherapy: Secondary | ICD-10-CM | POA: Insufficient documentation

## 2019-07-31 DIAGNOSIS — C787 Secondary malignant neoplasm of liver and intrahepatic bile duct: Secondary | ICD-10-CM | POA: Diagnosis present

## 2019-07-31 LAB — CBC WITH DIFFERENTIAL (CANCER CENTER ONLY)
Abs Immature Granulocytes: 0.05 10*3/uL (ref 0.00–0.07)
Basophils Absolute: 0.1 10*3/uL (ref 0.0–0.1)
Basophils Relative: 1 %
Eosinophils Absolute: 0.2 10*3/uL (ref 0.0–0.5)
Eosinophils Relative: 2 %
HCT: 48 % (ref 39.0–52.0)
Hemoglobin: 16.2 g/dL (ref 13.0–17.0)
Immature Granulocytes: 0 %
Lymphocytes Relative: 8 %
Lymphs Abs: 1 10*3/uL (ref 0.7–4.0)
MCH: 35.1 pg — ABNORMAL HIGH (ref 26.0–34.0)
MCHC: 33.8 g/dL (ref 30.0–36.0)
MCV: 104.1 fL — ABNORMAL HIGH (ref 80.0–100.0)
Monocytes Absolute: 1.4 10*3/uL — ABNORMAL HIGH (ref 0.1–1.0)
Monocytes Relative: 11 %
Neutro Abs: 10.2 10*3/uL — ABNORMAL HIGH (ref 1.7–7.7)
Neutrophils Relative %: 78 %
Platelet Count: 307 10*3/uL (ref 150–400)
RBC: 4.61 MIL/uL (ref 4.22–5.81)
RDW: 15 % (ref 11.5–15.5)
WBC Count: 12.9 10*3/uL — ABNORMAL HIGH (ref 4.0–10.5)
nRBC: 0 % (ref 0.0–0.2)

## 2019-07-31 LAB — CMP (CANCER CENTER ONLY)
ALT: 31 U/L (ref 0–44)
AST: 31 U/L (ref 15–41)
Albumin: 3.3 g/dL — ABNORMAL LOW (ref 3.5–5.0)
Alkaline Phosphatase: 123 U/L (ref 38–126)
Anion gap: 9 (ref 5–15)
BUN: 16 mg/dL (ref 8–23)
CO2: 29 mmol/L (ref 22–32)
Calcium: 8.7 mg/dL — ABNORMAL LOW (ref 8.9–10.3)
Chloride: 100 mmol/L (ref 98–111)
Creatinine: 0.76 mg/dL (ref 0.61–1.24)
GFR, Est AFR Am: >60 mL/min (ref >60)
GFR, Estimated: >60 mL/min (ref >60)
Glucose, Bld: 141 mg/dL — ABNORMAL HIGH (ref 70–99)
Potassium: 3.9 mmol/L (ref 3.5–5.1)
Sodium: 138 mmol/L (ref 135–145)
Total Bilirubin: 0.6 mg/dL (ref 0.3–1.2)
Total Protein: 7 g/dL (ref 6.5–8.1)

## 2019-07-31 LAB — CEA (IN HOUSE-CHCC): CEA (CHCC-In House): 1460.08 ng/mL — ABNORMAL HIGH (ref 0.00–5.00)

## 2019-07-31 LAB — TSH: TSH: 6.741 u[IU]/mL — ABNORMAL HIGH (ref 0.320–4.118)

## 2019-07-31 MED ORDER — SODIUM CHLORIDE 0.9 % IV SOLN
500.0000 mg/m2 | Freq: Once | INTRAVENOUS | Status: AC
  Administered 2019-07-31: 1000 mg via INTRAVENOUS
  Filled 2019-07-31: qty 40

## 2019-07-31 MED ORDER — SODIUM CHLORIDE 0.9 % IV SOLN
Freq: Once | INTRAVENOUS | Status: AC
  Filled 2019-07-31: qty 250

## 2019-07-31 MED ORDER — SODIUM CHLORIDE 0.9% FLUSH
10.0000 mL | INTRAVENOUS | Status: AC | PRN
  Filled 2019-07-31: qty 10

## 2019-07-31 MED ORDER — SODIUM CHLORIDE 0.9 % IV SOLN
200.0000 mg | Freq: Once | INTRAVENOUS | Status: AC
  Administered 2019-07-31: 200 mg via INTRAVENOUS
  Filled 2019-07-31: qty 8

## 2019-07-31 MED ORDER — PROCHLORPERAZINE MALEATE 10 MG PO TABS
ORAL_TABLET | ORAL | Status: AC
  Filled 2019-07-31: qty 1

## 2019-07-31 MED ORDER — PROCHLORPERAZINE MALEATE 10 MG PO TABS
10.0000 mg | ORAL_TABLET | Freq: Once | ORAL | Status: AC
  Administered 2019-07-31: 10 mg via ORAL

## 2019-07-31 NOTE — Progress Notes (Signed)
[place] OFFICE PROGRESS NOTE   Diagnosis: Non-small cell lung cancer  INTERVAL HISTORY:   [name] returns as scheduled.  He completed a cycle of Alimta/pembrolizumab 07/09/2019.  He reports receiving the Covid vaccine yesterday.  He has mild arm pain at the injection site.  No fever or achiness.  He denies nausea/vomiting.  No mouth sores.  No diarrhea.  No skin rash.  He continues close follow-up with ophthalmology for macular degeneration.  Objective:  Vital signs in last 24 hours:  Blood pressure (!) 157/94, pulse 84, temperature 98.2 F (36.8 C), temperature source Temporal, resp. rate 18, height 5\' 10"  (1.778 m), weight 182 lb 3.2 oz (82.6 kg), SpO2 90 %.    HEENT: No thrush or ulcers. Resp: Distant breath sounds.  Faint inspiratory rhonchi at the lower lung fields.  No respiratory distress. Cardio: Regular rate and rhythm. GI: Abdomen soft and nontender.  Fullness right upper abdomen. Vascular: No leg edema. Neuro: Alert and oriented. Skin: No rash.   Lab Results:  Lab Results  Component Value Date   WBC 12.9 (H) 07/31/2019   HGB 16.2 07/31/2019   HCT 48.0 07/31/2019   MCV 104.1 (H) 07/31/2019   PLT 307 07/31/2019   NEUTROABS 10.2 (H) 07/31/2019    Imaging:  No results found.  Medications: I have reviewed the patient's current medications.  Assessment/Plan: 1. Pancreas mass, liver lesions  Abdominal ultrasound 09/03/2018-3 liver lesions. Intra-and extrahepatic biliary ductal dilatation.  Presentation to the emergency department 09/08/2018 with painless jaundice. Initial bilirubin returned at 18.3.   MRI abdomen 09/08/2018-1.9 x 2.6 cm hypoenhancing lesion arising exophytically along the posterior aspect of the pancreatic head suspicious for pancreatic adenocarcinoma. There was suspected involvement of the distal common duct with secondary intrahepatic/extrahepatic ductal dilatation. Lesion noted to abut the IVC. Multiple liver  metastases noted. Small upper abdominal/retroperitoneal lymph nodes noted.   ERCP 09/09/2018-high-grade stricture and obstruction of the distal common bile duct. A metallic biliary stent was placed. Brushings in the mid and lower third of the main bile duct were obtained with no malignant cells identified.   Bilirubin improved at 4.7 on 09/10/2018.   Biopsy of a liver lesion on 09/11/2018-metastatic carcinoma, cytokeratin 7 and TTF-1 positive, negative for cytokeratin 20, CDX 2, and PSA.  CT chest 09/22/2018-right hilar/mediastinal/retrocrural lymphadenopathy, ill-defined right lower lobe nodularity, emphysema  Cycle 1 carboplatin/Alimta/pembrolizumab 10/09/2018  Cycle 2 carboplatin/Alimta/pembrolizumab 10/30/2018  Cycle 3 carboplatin/Alimta/pembrolizumab 11/20/2018  Cycle 4 carboplatin/Alimta/pembrolizumab 12/11/2018  CT 12/26/2018-marked response to therapy of the radical abdominal adenopathy, decreased size of hepatic metastases, no new or progressive disease  Cycle 5 Alimta/pembrolizumab 01/01/2019  Cycle 6 Alimta/pembrolizumab 01/23/2019  Cycle 7 Alimta/pembrolizumab 02/12/2019  Cycle 8 Alimta/pembrolizumab 03/05/2019  Cycle 9 Alimta/pembrolizumab 03/26/2019  CTs 04/14/2019-slight interval decrease in size and conspicuity of multiple, irregular low-attenuation liver lesions. No new liver lesions. No recurrent mediastinal or hilar lymphadenopathy. No change in mildly prominent subcentimeter periaortic and peripheral lymph nodes without evidence of recurrent abdominal or pelvic lymphadenopathy.  Cycle 10 Alimta/pembrolizumab 04/16/2019  Cycle 11 pembrolizumab 05/07/2019 (Alimta held secondary to increased tearing and leg edema)  Cycle 12 Alimta/pembrolizumab 05/28/2019  Cycle 13 Alimta/pembrolizumab 06/18/2019  Restaging CTs 07/08/2019-worsening of low-density hepatic lesions.  Scattered small lymph nodes with increasing conspicuity, largest along the left periaortic chain  approximately 9 mm previously approximately 5 mm.  Cycle 14 Alimta/pembrolizumab 07/09/2019  Cycle 15 Alimta/pembrolizumab 07/31/2019 2. Biliary obstruction status post stent placement 3. COPD 4. Hypertension 5. Trace edema right leg 12/11/2018. Doppler  negative for DVT 12/12/2018. 6. Watery eyes, runny nose reported 12/11/2018. Question related to Alimta. Persistent watery eyes 04/16/2019. 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 Admission 12/24/2018 with a fever-no source for infection identified, completed outpatient course of ciprofloxacin   Disposition: [name] appears unchanged.  He has completed 14 cycles of Alimta/pembrolizumab.  Plan to proceed with cycle 15 today as scheduled.  We reviewed the CBC from today.  Counts are adequate to proceed with treatment.  He will return for lab, follow-up, Alimta/pembrolizumab in 3 weeks.  He will contact the office in the interim with any problems.    Ned Card ANP/GNP-BC   07/31/2019  10:26 AM

## 2019-07-31 NOTE — Patient Instructions (Signed)
[place] Discharge Instructions for Patients Receiving Chemotherapy  Today you received the following chemotherapy agents: Keytruda, Alimta  To help prevent nausea and vomiting after your treatment, we encourage you to take your nausea medication as directed.   If you develop nausea and vomiting that is not controlled by your nausea medication, call the clinic.   BELOW ARE SYMPTOMS THAT SHOULD BE REPORTED IMMEDIATELY:  *FEVER GREATER THAN 100.5 F  *CHILLS WITH OR WITHOUT FEVER  NAUSEA AND VOMITING THAT IS NOT CONTROLLED WITH YOUR NAUSEA MEDICATION  *UNUSUAL SHORTNESS OF BREATH  *UNUSUAL BRUISING OR BLEEDING  TENDERNESS IN MOUTH AND THROAT WITH OR WITHOUT PRESENCE OF ULCERS  *URINARY PROBLEMS  *BOWEL PROBLEMS  UNUSUAL RASH Items with * indicate a potential emergency and should be followed up as soon as possible.  Feel free to call the clinic should you have any questions or concerns. The clinic phone number is [phone].  Please show the [facility] at check-in to the Emergency Department and triage nurse.

## 2019-08-03 ENCOUNTER — Telehealth: Payer: Self-pay | Admitting: Oncology

## 2019-08-03 NOTE — Telephone Encounter (Signed)
Scheduled per los. Called and spoke with patient. Confirmed added appt

## 2019-08-16 ENCOUNTER — Other Ambulatory Visit: Payer: Self-pay | Admitting: Oncology

## 2019-08-20 ENCOUNTER — Inpatient Hospital Stay: Payer: BC Managed Care – PPO

## 2019-08-20 ENCOUNTER — Other Ambulatory Visit: Payer: Medicare Other

## 2019-08-20 ENCOUNTER — Inpatient Hospital Stay (HOSPITAL_BASED_OUTPATIENT_CLINIC_OR_DEPARTMENT_OTHER): Payer: BC Managed Care – PPO | Admitting: Oncology

## 2019-08-20 ENCOUNTER — Other Ambulatory Visit: Payer: Self-pay

## 2019-08-20 VITALS — BP 129/75 | HR 83 | Temp 98.5°F | Resp 18 | Ht 70.0 in | Wt 179.9 lb

## 2019-08-20 DIAGNOSIS — C3490 Malignant neoplasm of unspecified part of unspecified bronchus or lung: Secondary | ICD-10-CM | POA: Diagnosis not present

## 2019-08-20 DIAGNOSIS — Z5111 Encounter for antineoplastic chemotherapy: Secondary | ICD-10-CM | POA: Diagnosis not present

## 2019-08-20 LAB — CMP (CANCER CENTER ONLY)
ALT: 32 U/L (ref 0–44)
AST: 32 U/L (ref 15–41)
Albumin: 3.2 g/dL — ABNORMAL LOW (ref 3.5–5.0)
Alkaline Phosphatase: 147 U/L — ABNORMAL HIGH (ref 38–126)
Anion gap: 10 (ref 5–15)
BUN: 13 mg/dL (ref 8–23)
CO2: 29 mmol/L (ref 22–32)
Calcium: 8.7 mg/dL — ABNORMAL LOW (ref 8.9–10.3)
Chloride: 97 mmol/L — ABNORMAL LOW (ref 98–111)
Creatinine: 0.71 mg/dL (ref 0.61–1.24)
GFR, Est AFR Am: >60 mL/min (ref >60)
GFR, Estimated: >60 mL/min (ref >60)
Glucose, Bld: 120 mg/dL — ABNORMAL HIGH (ref 70–99)
Potassium: 4.1 mmol/L (ref 3.5–5.1)
Sodium: 136 mmol/L (ref 135–145)
Total Bilirubin: 0.5 mg/dL (ref 0.3–1.2)
Total Protein: 7.4 g/dL (ref 6.5–8.1)

## 2019-08-20 LAB — TSH: TSH: 1.362 u[IU]/mL (ref 0.320–4.118)

## 2019-08-20 LAB — CBC WITH DIFFERENTIAL (CANCER CENTER ONLY)
Abs Immature Granulocytes: 0.07 10*3/uL (ref 0.00–0.07)
Basophils Absolute: 0.1 10*3/uL (ref 0.0–0.1)
Basophils Relative: 1 %
Eosinophils Absolute: 0.2 10*3/uL (ref 0.0–0.5)
Eosinophils Relative: 1 %
HCT: 48.1 % (ref 39.0–52.0)
Hemoglobin: 16.3 g/dL (ref 13.0–17.0)
Immature Granulocytes: 1 %
Lymphocytes Relative: 9 %
Lymphs Abs: 1.1 10*3/uL (ref 0.7–4.0)
MCH: 34.5 pg — ABNORMAL HIGH (ref 26.0–34.0)
MCHC: 33.9 g/dL (ref 30.0–36.0)
MCV: 101.9 fL — ABNORMAL HIGH (ref 80.0–100.0)
Monocytes Absolute: 1.3 10*3/uL — ABNORMAL HIGH (ref 0.1–1.0)
Monocytes Relative: 11 %
Neutro Abs: 9.3 10*3/uL — ABNORMAL HIGH (ref 1.7–7.7)
Neutrophils Relative %: 77 %
Platelet Count: 393 10*3/uL (ref 150–400)
RBC: 4.72 MIL/uL (ref 4.22–5.81)
RDW: 14.7 % (ref 11.5–15.5)
WBC Count: 12 10*3/uL — ABNORMAL HIGH (ref 4.0–10.5)
nRBC: 0 % (ref 0.0–0.2)

## 2019-08-20 LAB — CEA (IN HOUSE-CHCC): CEA (CHCC-In House): 1436.22 ng/mL — ABNORMAL HIGH (ref 0.00–5.00)

## 2019-08-20 MED ORDER — SODIUM CHLORIDE 0.9 % IV SOLN
200.0000 mg | Freq: Once | INTRAVENOUS | Status: AC
  Administered 2019-08-20: 200 mg via INTRAVENOUS
  Filled 2019-08-20: qty 8

## 2019-08-20 MED ORDER — SODIUM CHLORIDE 0.9 % IV SOLN
Freq: Once | INTRAVENOUS | Status: AC
  Filled 2019-08-20: qty 250

## 2019-08-20 MED ORDER — PROCHLORPERAZINE MALEATE 10 MG PO TABS
10.0000 mg | ORAL_TABLET | Freq: Once | ORAL | Status: AC
  Administered 2019-08-20: 10 mg via ORAL

## 2019-08-20 MED ORDER — PROCHLORPERAZINE MALEATE 10 MG PO TABS
ORAL_TABLET | ORAL | Status: AC
  Filled 2019-08-20: qty 1

## 2019-08-20 MED ORDER — SODIUM CHLORIDE 0.9 % IV SOLN
500.0000 mg/m2 | Freq: Once | INTRAVENOUS | Status: AC
  Administered 2019-08-20: 1000 mg via INTRAVENOUS
  Filled 2019-08-20: qty 40

## 2019-08-20 NOTE — Patient Instructions (Signed)
[facility] Discharge Instructions for Patients Receiving Chemotherapy  Today you received the following chemotherapy agents Keytruda; Alimta  To help prevent nausea and vomiting after your treatment, we encourage you to take your nausea medication as directed   If you develop nausea and vomiting that is not controlled by your nausea medication, call the clinic.   BELOW ARE SYMPTOMS THAT SHOULD BE REPORTED IMMEDIATELY:  *FEVER GREATER THAN 100.5 F  *CHILLS WITH OR WITHOUT FEVER  NAUSEA AND VOMITING THAT IS NOT CONTROLLED WITH YOUR NAUSEA MEDICATION  *UNUSUAL SHORTNESS OF BREATH  *UNUSUAL BRUISING OR BLEEDING  TENDERNESS IN MOUTH AND THROAT WITH OR WITHOUT PRESENCE OF ULCERS  *URINARY PROBLEMS  *BOWEL PROBLEMS  UNUSUAL RASH Items with * indicate a potential emergency and should be followed up as soon as possible.  Feel free to call the clinic should you have any questions or concerns. The clinic phone number is [phone].  Please show the CHEMO ALERT CARD at check-in to the Emergency Department and triage nurse.   

## 2019-08-20 NOTE — Progress Notes (Signed)
[place] OFFICE PROGRESS NOTE   Diagnosis: Non-small cell lung cancer  INTERVAL HISTORY:   Ricky Conway completed another cycle of Alimta/pembrolizumab on 07/31/2019.  No rash or diarrhea.  Stable mild exertional dyspnea.  No other complaint.  Good appetite. He is scheduled to receive the second COVID-19 vaccine today. Objective:  Vital signs in last 24 hours:  There were no vitals taken for this visit.     Resp: Distant breath sounds, no respiratory distress Cardio: Regular rate and rhythm GI: No hepatosplenomegaly Vascular: No leg edema  Skin: No rash    Lab Results:  Lab Results  Component Value Date   WBC 12.0 (H) 08/20/2019   HGB 16.3 08/20/2019   HCT 48.1 08/20/2019   MCV 101.9 (H) 08/20/2019   PLT 393 08/20/2019   NEUTROABS 9.3 (H) 08/20/2019    CMP  Lab Results  Component Value Date   NA 138 07/31/2019   K 3.9 07/31/2019   CL 100 07/31/2019   CO2 29 07/31/2019   GLUCOSE 141 (H) 07/31/2019   BUN 16 07/31/2019   CREATININE 0.76 07/31/2019   CALCIUM 8.7 (L) 07/31/2019   PROT 7.0 07/31/2019   ALBUMIN 3.3 (L) 07/31/2019   AST 31 07/31/2019   ALT 31 07/31/2019   ALKPHOS 123 07/31/2019   BILITOT 0.6 07/31/2019   GFRNONAA >60 07/31/2019   GFRAA >60 07/31/2019    Lab Results  Component Value Date   CEA1 1,460.08 (H) 07/31/2019     Medications: I have reviewed the patient's current medications.   Assessment/Plan: 1. Pancreas mass, liver lesions  Abdominal ultrasound 09/03/2018-3 liver lesions. Intra-and extrahepatic biliary ductal dilatation.  Presentation to the emergency department 09/08/2018 with painless jaundice. Initial bilirubin returned at 18.3.   MRI abdomen 09/08/2018-1.9 x 2.6 cm hypoenhancing lesion arising exophytically along the posterior aspect of the pancreatic head suspicious for pancreatic adenocarcinoma. There was suspected involvement of the distal common duct with secondary intrahepatic/extrahepatic ductal  dilatation. Lesion noted to abut the IVC. Multiple liver metastases noted. Small upper abdominal/retroperitoneal lymph nodes noted.   ERCP 09/09/2018-high-grade stricture and obstruction of the distal common bile duct. A metallic biliary stent was placed. Brushings in the mid and lower third of the main bile duct were obtained with no malignant cells identified.   Bilirubin improved at 4.7 on 09/10/2018.   Biopsy of a liver lesion on 09/11/2018-metastatic carcinoma, cytokeratin 7 and TTF-1 positive, negative for cytokeratin 20, CDX 2, and PSA.  CT chest 09/22/2018-right hilar/mediastinal/retrocrural lymphadenopathy, ill-defined right lower lobe nodularity, emphysema  Cycle 1 carboplatin/Alimta/pembrolizumab 10/09/2018  Cycle 2 carboplatin/Alimta/pembrolizumab 10/30/2018  Cycle 3 carboplatin/Alimta/pembrolizumab 11/20/2018  Cycle 4 carboplatin/Alimta/pembrolizumab 12/11/2018  CT 12/26/2018-marked response to therapy of the radical abdominal adenopathy, decreased size of hepatic metastases, no new or progressive disease  Cycle 5 Alimta/pembrolizumab 01/01/2019  Cycle 6 Alimta/pembrolizumab 01/23/2019  Cycle 7 Alimta/pembrolizumab 02/12/2019  Cycle 8 Alimta/pembrolizumab 03/05/2019  Cycle 9 Alimta/pembrolizumab 03/26/2019  CTs 04/14/2019-slight interval decrease in size and conspicuity of multiple, irregular low-attenuation liver lesions. No new liver lesions. No recurrent mediastinal or hilar lymphadenopathy. No change in mildly prominent subcentimeter periaortic and peripheral lymph nodes without evidence of recurrent abdominal or pelvic lymphadenopathy.  Cycle 10 Alimta/pembrolizumab 04/16/2019  Cycle 11 pembrolizumab 05/07/2019 (Alimta held secondary to increased tearing and leg edema)  Cycle 12 Alimta/pembrolizumab 05/28/2019  Cycle 13 Alimta/pembrolizumab 06/18/2019  Restaging CTs 07/08/2019-worsening of low-density hepatic lesions.  Scattered small lymph nodes with increasing  conspicuity, largest along the left periaortic chain approximately 9 mm  previously approximately 5 mm.  Cycle 14 Alimta/pembrolizumab 07/09/2019  Cycle 15 Alimta/pembrolizumab 07/31/2019  Cycle 16 Alimta/pembrolizumab 08/20/2019 2. Biliary obstruction status post stent placement 3. COPD 4. Hypertension 5. Trace edema right leg 12/11/2018. Doppler negative for DVT 12/12/2018. 6. Watery eyes, runny nose reported 12/11/2018. Question related to Alimta. Persistent watery eyes 04/16/2019. 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 Admission 12/24/2018 with a fever-no source for infection identified, completed outpatient course of ciprofloxacin     Disposition: Ricky Conway appears unchanged.  He will complete another treatment with Alimta/pembrolizumab today.  The CEA has been higher over the past few months.  He will undergo a restaging CT evaluation prior to an office visit in 3 weeks.  [name], MD  08/20/2019  12:40 PM

## 2019-08-24 ENCOUNTER — Telehealth: Payer: Self-pay | Admitting: Oncology

## 2019-08-24 NOTE — Telephone Encounter (Signed)
Scheduled per los. Called and spoke with patients wife. Confirmed appt

## 2019-09-06 ENCOUNTER — Other Ambulatory Visit: Payer: Self-pay | Admitting: Oncology

## 2019-09-08 ENCOUNTER — Other Ambulatory Visit: Payer: Self-pay

## 2019-09-08 ENCOUNTER — Encounter (HOSPITAL_COMMUNITY): Payer: Self-pay

## 2019-09-08 ENCOUNTER — Ambulatory Visit (HOSPITAL_COMMUNITY)
Admission: RE | Admit: 2019-09-08 | Discharge: 2019-09-08 | Disposition: A | Discharge status: Home / Self Care | Payer: BC Managed Care – PPO | Source: Ambulatory Visit | Attending: Oncology | Admitting: Oncology

## 2019-09-08 DIAGNOSIS — C3490 Malignant neoplasm of unspecified part of unspecified bronchus or lung: Secondary | ICD-10-CM | POA: Diagnosis present

## 2019-09-08 HISTORY — DX: Malignant (primary) neoplasm, unspecified: C80.1

## 2019-09-08 MED ORDER — SODIUM CHLORIDE (PF) 0.9 % IJ SOLN
INTRAMUSCULAR | Status: AC
  Filled 2019-09-08: qty 50

## 2019-09-08 MED ORDER — IOHEXOL 300 MG/ML  SOLN
100.0000 mL | Freq: Once | INTRAMUSCULAR | Status: AC | PRN
  Administered 2019-09-08: 100 mL via INTRAVENOUS

## 2019-09-10 ENCOUNTER — Inpatient Hospital Stay: Payer: BC Managed Care – PPO | Attending: Oncology | Admitting: Oncology

## 2019-09-10 ENCOUNTER — Inpatient Hospital Stay: Payer: BC Managed Care – PPO

## 2019-09-10 ENCOUNTER — Other Ambulatory Visit: Payer: Self-pay | Admitting: Radiology

## 2019-09-10 ENCOUNTER — Telehealth: Payer: Self-pay

## 2019-09-10 ENCOUNTER — Other Ambulatory Visit: Payer: Self-pay

## 2019-09-10 VITALS — BP 140/71 | HR 82 | Temp 98.2°F | Resp 18 | Ht 70.0 in | Wt 178.5 lb

## 2019-09-10 DIAGNOSIS — H01005 Unspecified blepharitis left lower eyelid: Secondary | ICD-10-CM | POA: Insufficient documentation

## 2019-09-10 DIAGNOSIS — C3490 Malignant neoplasm of unspecified part of unspecified bronchus or lung: Secondary | ICD-10-CM

## 2019-09-10 DIAGNOSIS — Z79899 Other long term (current) drug therapy: Secondary | ICD-10-CM | POA: Diagnosis not present

## 2019-09-10 DIAGNOSIS — H02105 Unspecified ectropion of left lower eyelid: Secondary | ICD-10-CM | POA: Insufficient documentation

## 2019-09-10 DIAGNOSIS — Z86718 Personal history of other venous thrombosis and embolism: Secondary | ICD-10-CM | POA: Diagnosis not present

## 2019-09-10 DIAGNOSIS — C3431 Malignant neoplasm of lower lobe, right bronchus or lung: Secondary | ICD-10-CM | POA: Insufficient documentation

## 2019-09-10 DIAGNOSIS — J449 Chronic obstructive pulmonary disease, unspecified: Secondary | ICD-10-CM | POA: Insufficient documentation

## 2019-09-10 DIAGNOSIS — I10 Essential (primary) hypertension: Secondary | ICD-10-CM | POA: Insufficient documentation

## 2019-09-10 DIAGNOSIS — C787 Secondary malignant neoplasm of liver and intrahepatic bile duct: Secondary | ICD-10-CM | POA: Insufficient documentation

## 2019-09-10 DIAGNOSIS — Z5111 Encounter for antineoplastic chemotherapy: Secondary | ICD-10-CM | POA: Diagnosis present

## 2019-09-10 DIAGNOSIS — H3530 Unspecified macular degeneration: Secondary | ICD-10-CM | POA: Insufficient documentation

## 2019-09-10 LAB — CBC WITH DIFFERENTIAL (CANCER CENTER ONLY)
Abs Immature Granulocytes: 0.08 10*3/uL — ABNORMAL HIGH (ref 0.00–0.07)
Basophils Absolute: 0.1 10*3/uL (ref 0.0–0.1)
Basophils Relative: 1 %
Eosinophils Absolute: 0.2 10*3/uL (ref 0.0–0.5)
Eosinophils Relative: 1 %
HCT: 49.1 % (ref 39.0–52.0)
Hemoglobin: 16.8 g/dL (ref 13.0–17.0)
Immature Granulocytes: 1 %
Lymphocytes Relative: 7 %
Lymphs Abs: 0.8 10*3/uL (ref 0.7–4.0)
MCH: 35.4 pg — ABNORMAL HIGH (ref 26.0–34.0)
MCHC: 34.2 g/dL (ref 30.0–36.0)
MCV: 103.4 fL — ABNORMAL HIGH (ref 80.0–100.0)
Monocytes Absolute: 1.3 10*3/uL — ABNORMAL HIGH (ref 0.1–1.0)
Monocytes Relative: 11 %
Neutro Abs: 9.2 10*3/uL — ABNORMAL HIGH (ref 1.7–7.7)
Neutrophils Relative %: 79 %
Platelet Count: 342 10*3/uL (ref 150–400)
RBC: 4.75 MIL/uL (ref 4.22–5.81)
RDW: 15 % (ref 11.5–15.5)
WBC Count: 11.6 10*3/uL — ABNORMAL HIGH (ref 4.0–10.5)
nRBC: 0 % (ref 0.0–0.2)

## 2019-09-10 LAB — CMP (CANCER CENTER ONLY)
ALT: 27 U/L (ref 0–44)
AST: 32 U/L (ref 15–41)
Albumin: 3.2 g/dL — ABNORMAL LOW (ref 3.5–5.0)
Alkaline Phosphatase: 131 U/L — ABNORMAL HIGH (ref 38–126)
Anion gap: 9 (ref 5–15)
BUN: 11 mg/dL (ref 8–23)
CO2: 28 mmol/L (ref 22–32)
Calcium: 9 mg/dL (ref 8.9–10.3)
Chloride: 98 mmol/L (ref 98–111)
Creatinine: 0.74 mg/dL (ref 0.61–1.24)
GFR, Est AFR Am: >60 mL/min (ref >60)
GFR, Estimated: >60 mL/min (ref >60)
Glucose, Bld: 125 mg/dL — ABNORMAL HIGH (ref 70–99)
Potassium: 4 mmol/L (ref 3.5–5.1)
Sodium: 135 mmol/L (ref 135–145)
Total Bilirubin: 0.6 mg/dL (ref 0.3–1.2)
Total Protein: 7.1 g/dL (ref 6.5–8.1)

## 2019-09-10 LAB — TSH: TSH: 5.834 u[IU]/mL — ABNORMAL HIGH (ref 0.320–4.118)

## 2019-09-10 NOTE — Telephone Encounter (Signed)
Foundation 1 testing requested. VM left at [facility] physicians for request of colonoscopy report per MD Benay Spice

## 2019-09-10 NOTE — Progress Notes (Signed)
DISCONTINUE ON PATHWAY REGIMEN - Non-Small Cell Lung     A cycle is every 21 days:     Pembrolizumab      Pemetrexed      Carboplatin   **Always confirm dose/schedule in your pharmacy ordering system**  REASON: Disease Progression PRIOR TREATMENT: [id number]: Pembrolizumab 200 mg + Pemetrexed 500 mg/m2 + Carboplatin AUC=5 q21 Days x 4-6 Cycles TREATMENT RESPONSE: Partial Response (PR)  START OFF PATHWAY REGIMEN - Non-Small Cell Lung   [id number]:Gemcitabine 1,000 mg/m2 D1, 8  q21 Days:   A cycle is every 21 days:     Gemcitabine   **Always confirm dose/schedule in your pharmacy ordering system**  Patient Characteristics: Stage IV Metastatic, Nonsquamous, Initial Chemotherapy/Immunotherapy, PS = 0, 1, ALK or EGFR or ROS1 or NTRK or MET or RET Genomic Alterations - Awaiting Test Results Therapeutic Status: Stage IV Metastatic Histology: Nonsquamous Cell ROS1 Rearrangement Status: Awaiting Test Results Other Mutations/Biomarkers: No Other Actionable Mutations NTRK Gene Fusion Status: Awaiting Test Results PD-L1 Expression Status: Awaiting Test Results Chemotherapy/Immunotherapy LOT: Initial Chemotherapy/Immunotherapy Molecular Targeted Therapy: Not Appropriate MET Exon 14 Mutation Status: Awaiting Test Results RET Gene Fusion Status: Awaiting Test Results ALK Rearrangement Status: Awaiting Test Results EGFR Mutation Status: Awaiting Test Results BRAF [id number] Mutation Status: Awaiting Test Results ECOG Performance Status: 1 Biomarker Assessment Status Confirmation: Awaiting genomic biomarker test(s) results and need to start chemotherapy Intent of Therapy: Non-Curative / Palliative Intent, Discussed with Patient

## 2019-09-10 NOTE — Progress Notes (Signed)
[facility] OFFICE PROGRESS NOTE   Diagnosis: Non-small cell lung cancer  INTERVAL HISTORY:   Mr. Engelbert completed another cycle of Alimta/pembrolizumab on 08/20/2019.  He reports mild malaise.  No new complaint.  Good appetite.  Objective:  Vital signs in last 24 hours:  Blood pressure 140/71, pulse 82, temperature 98.2 F (36.8 C), temperature source Temporal, resp. rate 18, height 5\' 10"  (1.778 m), weight 178 lb 8 oz (81 kg), SpO2 93 %.    Limited physical examination secondary to distancing with the Covid pandemic Lymphatics: No cervical, supraclavicular, axillary, or inguinal nodes GI: Firm fullness in the right mid abdomen without a discrete mass or liver edge Vascular: No leg edema   Lab Results:  Lab Results  Component Value Date   WBC 11.6 (H) 09/10/2019   HGB 16.8 09/10/2019   HCT 49.1 09/10/2019   MCV 103.4 (H) 09/10/2019   PLT 342 09/10/2019   NEUTROABS 9.2 (H) 09/10/2019    CMP  Lab Results  Component Value Date   NA 135 09/10/2019   K 4.0 09/10/2019   CL 98 09/10/2019   CO2 28 09/10/2019   GLUCOSE 125 (H) 09/10/2019   BUN 11 09/10/2019   CREATININE 0.74 09/10/2019   CALCIUM 9.0 09/10/2019   PROT 7.1 09/10/2019   ALBUMIN 3.2 (L) 09/10/2019   AST 32 09/10/2019   ALT 27 09/10/2019   ALKPHOS 131 (H) 09/10/2019   BILITOT 0.6 09/10/2019   GFRNONAA >60 09/10/2019   GFRAA >60 09/10/2019    Lab Results  Component Value Date   CEA1 1,436.22 (H) 08/20/2019    Lab Results  Component Value Date   INR 1.1 12/24/2018    Imaging:  CT CHEST W CONTRAST  Result Date: 09/08/2019 CLINICAL DATA:  Lung cancer diagnosed last year. Chemotherapy in progress. Immunotherapy in progress. Asymptomatic. EXAM: CT CHEST, ABDOMEN, AND PELVIS WITH CONTRAST TECHNIQUE: Multidetector CT imaging of the chest, abdomen and pelvis was performed following the standard protocol during bolus administration of intravenous contrast. CONTRAST:  147mL OMNIPAQUE  IOHEXOL 300 MG/ML SOLN 100 cc of Omnipaque 300 COMPARISON:  07/08/2019 FINDINGS: CT CHEST FINDINGS Cardiovascular: Advanced aortic and branch vessel atherosclerosis. Tortuous thoracic aorta. Borderline cardiomegaly. Multivessel coronary artery atherosclerosis. No central pulmonary embolism, on this non-dedicated study. Mediastinum/Nodes: No hilar adenopathy. Low thoracic periaortic node measures 7 mm on 51/2 versus 5 mm on the prior exam (when remeasured). A retrocrural node measures 9 mm on 55/2 versus 7 mm on the prior exam (when remeasured). Lungs/Pleura: No pleural fluid. Advanced bullous type emphysema. Nodule along the right minor fissure measures 7 mm on 114/6 versus 6 mm on the prior exam (when remeasured). Possibly a subpleural lymph node. Right lower lobe subpleural nodule of 7 mm on 90/6 is similar to on the prior exam. 6 mm lingular nodule on 141/6, similar. Other smaller right lower lobe subpleural pulmonary nodules, including on 87/6, are unchanged. Musculoskeletal: Nonacute ninth and tenth posterolateral left rib fractures. Similar. CT ABDOMEN PELVIS FINDINGS Hepatobiliary: Multifocal hepatic metastasis. Many of these are relatively ill-defined, and difficult to measure/quantify. A well-defined segment 2/3 mass measures 3.3 by 3.1 cm on 62/2 versus 2.7 x 2.4 cm on the prior exam (when remeasured). A segment 4A lesion measures 2.7 by 2.3 cm on 57/2 versus 1.7 by 1.9 cm on the prior exam (when remeasured). A right hepatic lobe pericholecystic lesion measures 1.7 cm on 71/2 versus 1.5 cm on the prior exam (when remeasured). Similar borderline gallbladder distension, with probable dependent  stones. Biliary stent in place with the upper normal intrahepatic duct caliber. Pancreas: Normal, without mass or ductal dilatation. Spleen: Normal in size, without focal abnormality. Adrenals/Urinary Tract: Normal right adrenal gland. Left adrenal thickening, similar. No hydronephrosis. Too small to characterize left  renal lesion. Normal right kidney. Normal urinary bladder. Stomach/Bowel: Normal stomach, without wall thickening. Scattered colonic diverticula. Colonic stool burden suggests constipation. The cecum extends into the central upper pelvis. Normal terminal ileum and appendix. Normal small bowel. Vascular/Lymphatic: Advanced aortic and branch vessel atherosclerosis. 11 mm left periaortic node on 79/2 measured 9 mm on the prior exam (when remeasured). More cephalad left periaortic node measures 8 mm on 75/2 versus 7 mm on the prior. No pelvic sidewall adenopathy. Reproductive: Moderate prostatomegaly. Other: No significant free fluid. No evidence of omental or peritoneal disease. Musculoskeletal: Degenerative partial fusion of the right sacroiliac joint. Lumbosacral spondylosis. IMPRESSION: 1. Mild progression of hepatic metastasis. 2. Mild enlargement of abdominal retroperitoneal and lower thoracic nodes, suspicious for metastatic disease. 3. Similar nonspecific pulmonary nodules, favoring subpleural lymph nodes. 4. Possible constipation. 5. Prostatomegaly. 6. Aortic atherosclerosis (ICD10-I70.0), coronary artery atherosclerosis and emphysema (ICD10-J43.9). Electronically Signed   By: Abigail Miyamoto M.D.   On: 09/08/2019 15:26   CT ABDOMEN PELVIS W CONTRAST  Result Date: 09/08/2019 CLINICAL DATA:  Lung cancer diagnosed last year. Chemotherapy in progress. Immunotherapy in progress. Asymptomatic. EXAM: CT CHEST, ABDOMEN, AND PELVIS WITH CONTRAST TECHNIQUE: Multidetector CT imaging of the chest, abdomen and pelvis was performed following the standard protocol during bolus administration of intravenous contrast. CONTRAST:  167mL OMNIPAQUE IOHEXOL 300 MG/ML SOLN 100 cc of Omnipaque 300 COMPARISON:  07/08/2019 FINDINGS: CT CHEST FINDINGS Cardiovascular: Advanced aortic and branch vessel atherosclerosis. Tortuous thoracic aorta. Borderline cardiomegaly. Multivessel coronary artery atherosclerosis. No central pulmonary  embolism, on this non-dedicated study. Mediastinum/Nodes: No hilar adenopathy. Low thoracic periaortic node measures 7 mm on 51/2 versus 5 mm on the prior exam (when remeasured). A retrocrural node measures 9 mm on 55/2 versus 7 mm on the prior exam (when remeasured). Lungs/Pleura: No pleural fluid. Advanced bullous type emphysema. Nodule along the right minor fissure measures 7 mm on 114/6 versus 6 mm on the prior exam (when remeasured). Possibly a subpleural lymph node. Right lower lobe subpleural nodule of 7 mm on 90/6 is similar to on the prior exam. 6 mm lingular nodule on 141/6, similar. Other smaller right lower lobe subpleural pulmonary nodules, including on 87/6, are unchanged. Musculoskeletal: Nonacute ninth and tenth posterolateral left rib fractures. Similar. CT ABDOMEN PELVIS FINDINGS Hepatobiliary: Multifocal hepatic metastasis. Many of these are relatively ill-defined, and difficult to measure/quantify. A well-defined segment 2/3 mass measures 3.3 by 3.1 cm on 62/2 versus 2.7 x 2.4 cm on the prior exam (when remeasured). A segment 4A lesion measures 2.7 by 2.3 cm on 57/2 versus 1.7 by 1.9 cm on the prior exam (when remeasured). A right hepatic lobe pericholecystic lesion measures 1.7 cm on 71/2 versus 1.5 cm on the prior exam (when remeasured). Similar borderline gallbladder distension, with probable dependent stones. Biliary stent in place with the upper normal intrahepatic duct caliber. Pancreas: Normal, without mass or ductal dilatation. Spleen: Normal in size, without focal abnormality. Adrenals/Urinary Tract: Normal right adrenal gland. Left adrenal thickening, similar. No hydronephrosis. Too small to characterize left renal lesion. Normal right kidney. Normal urinary bladder. Stomach/Bowel: Normal stomach, without wall thickening. Scattered colonic diverticula. Colonic stool burden suggests constipation. The cecum extends into the central upper pelvis. Normal terminal ileum and appendix. Normal  small bowel. Vascular/Lymphatic: Advanced aortic and branch vessel atherosclerosis. 11 mm left periaortic node on 79/2 measured 9 mm on the prior exam (when remeasured). More cephalad left periaortic node measures 8 mm on 75/2 versus 7 mm on the prior. No pelvic sidewall adenopathy. Reproductive: Moderate prostatomegaly. Other: No significant free fluid. No evidence of omental or peritoneal disease. Musculoskeletal: Degenerative partial fusion of the right sacroiliac joint. Lumbosacral spondylosis. IMPRESSION: 1. Mild progression of hepatic metastasis. 2. Mild enlargement of abdominal retroperitoneal and lower thoracic nodes, suspicious for metastatic disease. 3. Similar nonspecific pulmonary nodules, favoring subpleural lymph nodes. 4. Possible constipation. 5. Prostatomegaly. 6. Aortic atherosclerosis (ICD10-I70.0), coronary artery atherosclerosis and emphysema (ICD10-J43.9). Electronically Signed   By: Abigail Miyamoto M.D.   On: 09/08/2019 15:26    Medications: I have reviewed the patient's current medications.   Assessment/Plan: 1. Pancreas mass, liver lesions  Abdominal ultrasound 09/03/2018-3 liver lesions. Intra-and extrahepatic biliary ductal dilatation.  Presentation to the emergency department 09/08/2018 with painless jaundice. Initial bilirubin returned at 18.3.   MRI abdomen 09/08/2018-1.9 x 2.6 cm hypoenhancing lesion arising exophytically along the posterior aspect of the pancreatic head suspicious for pancreatic adenocarcinoma. There was suspected involvement of the distal common duct with secondary intrahepatic/extrahepatic ductal dilatation. Lesion noted to abut the IVC. Multiple liver metastases noted. Small upper abdominal/retroperitoneal lymph nodes noted.   ERCP 09/09/2018-high-grade stricture and obstruction of the distal common bile duct. A metallic biliary stent was placed. Brushings in the mid and lower third of the main bile duct were obtained with no malignant cells  identified.   Bilirubin improved at 4.7 on 09/10/2018.   Biopsy of a liver lesion on 09/11/2018-metastatic carcinoma, cytokeratin 7 and TTF-1 positive, negative for cytokeratin 20, CDX 2, and PSA.  CT chest 09/22/2018-right hilar/mediastinal/retrocrural lymphadenopathy, ill-defined right lower lobe nodularity, emphysema  Cycle 1 carboplatin/Alimta/pembrolizumab 10/09/2018  Cycle 2 carboplatin/Alimta/pembrolizumab 10/30/2018  Cycle 3 carboplatin/Alimta/pembrolizumab 11/20/2018  Cycle 4 carboplatin/Alimta/pembrolizumab 12/11/2018  CT 12/26/2018-marked response to therapy of the radical abdominal adenopathy, decreased size of hepatic metastases, no new or progressive disease  Cycle 5 Alimta/pembrolizumab 01/01/2019  Cycle 6 Alimta/pembrolizumab 01/23/2019  Cycle 7 Alimta/pembrolizumab 02/12/2019  Cycle 8 Alimta/pembrolizumab 03/05/2019  Cycle 9 Alimta/pembrolizumab 03/26/2019  CTs 04/14/2019-slight interval decrease in size and conspicuity of multiple, irregular low-attenuation liver lesions. No new liver lesions. No recurrent mediastinal or hilar lymphadenopathy. No change in mildly prominent subcentimeter periaortic and peripheral lymph nodes without evidence of recurrent abdominal or pelvic lymphadenopathy.  Cycle 10 Alimta/pembrolizumab 04/16/2019  Cycle 11 pembrolizumab 05/07/2019 (Alimta held secondary to increased tearing and leg edema)  Cycle 12 Alimta/pembrolizumab 05/28/2019  Cycle 13 Alimta/pembrolizumab 06/18/2019  Restaging CTs 07/08/2019-worsening of low-density hepatic lesions.  Scattered small lymph nodes with increasing conspicuity, largest along the left periaortic chain approximately 9 mm previously approximately 5 mm.  Cycle 14 Alimta/pembrolizumab 07/09/2019  Cycle 15 Alimta/pembrolizumab 07/31/2019  Cycle 16 Alimta/pembrolizumab 08/20/2019  Rising CEA beginning December 2020  CTs 09/08/2019-enlargement of liver metastases, mild enlargement of  abdominal/retroperitoneal and lower thoracic nodes, nonspecific pulmonary nodules-stable 2. Biliary obstruction status post stent placement 3. COPD 4. Hypertension 5. Trace edema right leg 12/11/2018. Doppler negative for DVT 12/12/2018. 6. Watery eyes, runny nose reported 12/11/2018. Question related to Alimta. Persistent watery eyes 04/16/2019. 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 Admission 12/24/2018 with a fever-no source for infection identified, completed outpatient course of ciprofloxacin      Disposition: Mr. Savini appears unchanged.  The CEA has been higher over the past few months.  The restaging CT reveals enlargement of liver lesions and abdominal/retroperitoneal lymph nodes.  I reviewed the CT images.  We discussed treatment options.  Alimta/pembrolizumab will be discontinued.  He most likely has metastatic non-small cell lung cancer, though a primary tumor site has not been clearly identified.  He is smoking history and the immunostains on the liver biopsy are consistent with lung cancer.  It is possible he has an occult pancreaticobiliary primary.  He reports undergoing a colonoscopy 3-4 years ago.  I recommend beginning gemcitabine.  We reviewed potential toxicities associated with gemcitabine including the chance for alopecia, hematologic toxicity, rash, and pneumonitis.  He agrees to proceed.  He will be referred for Port-A-Cath placement with the plan to begin gemcitabine on 09/17/2019.  I will request Foundation 1 testing on the liver biopsy.  [name], MD  09/10/2019  11:13 AM

## 2019-09-11 ENCOUNTER — Telehealth: Payer: Self-pay | Admitting: Oncology

## 2019-09-11 NOTE — Telephone Encounter (Signed)
Scheduled appt per 3/18 los- pt is aware of apt date and time

## 2019-09-11 NOTE — Progress Notes (Signed)
Pharmacist Chemotherapy Monitoring - Initial Assessment    Anticipated start date: 09/17/19   Regimen:  . Are orders appropriate based on the patient's diagnosis, regimen, and cycle? Yes . Does the plan date match the patient's scheduled date? Yes . Is the sequencing of drugs appropriate? Yes . Are the premedications appropriate for the patient's regimen? Yes . Prior Authorization for treatment is: Approved  Organ Function and Labs: Marland Kitchen Are dose adjustments needed based on the patient's renal function, hepatic function, or hematologic function? Yes . Are appropriate labs ordered prior to the start of patient's treatment? Yes . Other organ system assessment, if indicated: N/A . The following baseline labs, if indicated, have been ordered: n/a  Dose Assessment: . Are the drug doses appropriate? Yes . Are the following correct: o Drug concentrations Yes o IV fluid compatible with drug Yes o Administration routes Yes o Timing of therapy Yes . If applicable, does the patient have documented access for treatment and/or plans for port-a-cath placement? Yes . If applicable, have lifetime cumulative doses been properly documented and assessed? not applicable  Toxicity Monitoring/Prevention: . The patient has the following take home antiemetics prescribed: Prochlorperazine . The patient has the following take home medications prescribed: folic acid for pemetrexed - need to have pt complete current bottle then D/C . Medication allergies and previous infusion related reactions, if applicable, have been reviewed and addressed. Yes . The patient's current medication list has been assessed for drug-drug interactions with their chemotherapy regimen. no significant drug-drug interactions were identified on review.  Order Review: . Are the treatment plan orders signed? No . Is the patient scheduled to see a provider prior to their treatment? Yes  I verify that I have reviewed each item in the above  checklist and answered each question accordingly.   Kennith Center, Pharm.D., CPP 09/11/2019@4 :08 PM

## 2019-09-13 ENCOUNTER — Other Ambulatory Visit: Payer: Self-pay | Admitting: Oncology

## 2019-09-14 ENCOUNTER — Other Ambulatory Visit: Payer: Self-pay | Admitting: Oncology

## 2019-09-14 ENCOUNTER — Ambulatory Visit (HOSPITAL_COMMUNITY)
Admission: RE | Admit: 2019-09-14 | Discharge: 2019-09-14 | Disposition: A | Discharge status: Home / Self Care | Payer: BC Managed Care – PPO | Source: Ambulatory Visit | Attending: Oncology | Admitting: Oncology

## 2019-09-14 ENCOUNTER — Other Ambulatory Visit: Payer: Self-pay

## 2019-09-14 ENCOUNTER — Encounter (HOSPITAL_COMMUNITY): Payer: Self-pay

## 2019-09-14 DIAGNOSIS — F17210 Nicotine dependence, cigarettes, uncomplicated: Secondary | ICD-10-CM | POA: Diagnosis not present

## 2019-09-14 DIAGNOSIS — C3490 Malignant neoplasm of unspecified part of unspecified bronchus or lung: Secondary | ICD-10-CM

## 2019-09-14 DIAGNOSIS — Z79899 Other long term (current) drug therapy: Secondary | ICD-10-CM | POA: Diagnosis not present

## 2019-09-14 DIAGNOSIS — E785 Hyperlipidemia, unspecified: Secondary | ICD-10-CM | POA: Diagnosis not present

## 2019-09-14 DIAGNOSIS — I10 Essential (primary) hypertension: Secondary | ICD-10-CM | POA: Diagnosis not present

## 2019-09-14 DIAGNOSIS — Z7982 Long term (current) use of aspirin: Secondary | ICD-10-CM | POA: Insufficient documentation

## 2019-09-14 DIAGNOSIS — J449 Chronic obstructive pulmonary disease, unspecified: Secondary | ICD-10-CM | POA: Insufficient documentation

## 2019-09-14 HISTORY — DX: Dyspnea, unspecified: R06.00

## 2019-09-14 HISTORY — PX: IR IMAGING GUIDED PORT INSERTION: IMG5740

## 2019-09-14 LAB — CBC WITH DIFFERENTIAL/PLATELET
Abs Immature Granulocytes: 0.05 10*3/uL (ref 0.00–0.07)
Basophils Absolute: 0.1 10*3/uL (ref 0.0–0.1)
Basophils Relative: 1 %
Eosinophils Absolute: 0.1 10*3/uL (ref 0.0–0.5)
Eosinophils Relative: 1 %
HCT: 50.5 % (ref 39.0–52.0)
Hemoglobin: 17 g/dL (ref 13.0–17.0)
Immature Granulocytes: 1 %
Lymphocytes Relative: 8 %
Lymphs Abs: 0.9 10*3/uL (ref 0.7–4.0)
MCH: 34.7 pg — ABNORMAL HIGH (ref 26.0–34.0)
MCHC: 33.7 g/dL (ref 30.0–36.0)
MCV: 103.1 fL — ABNORMAL HIGH (ref 80.0–100.0)
Monocytes Absolute: 1 10*3/uL (ref 0.1–1.0)
Monocytes Relative: 9 %
Neutro Abs: 8.7 10*3/uL — ABNORMAL HIGH (ref 1.7–7.7)
Neutrophils Relative %: 80 %
Platelets: 310 10*3/uL (ref 150–400)
RBC: 4.9 MIL/uL (ref 4.22–5.81)
RDW: 14.6 % (ref 11.5–15.5)
WBC: 10.8 10*3/uL — ABNORMAL HIGH (ref 4.0–10.5)
nRBC: 0 % (ref 0.0–0.2)

## 2019-09-14 LAB — PROTIME-INR
INR: 1 (ref 0.8–1.2)
Prothrombin Time: 12.6 seconds (ref 11.4–15.2)

## 2019-09-14 MED ORDER — MIDAZOLAM HCL 2 MG/2ML IJ SOLN
INTRAMUSCULAR | Status: AC
  Filled 2019-09-14: qty 2

## 2019-09-14 MED ORDER — CEFAZOLIN SODIUM-DEXTROSE 2-4 GM/100ML-% IV SOLN
2.0000 g | INTRAVENOUS | Status: AC
  Administered 2019-09-14: 2 g via INTRAVENOUS
  Filled 2019-09-14: qty 100

## 2019-09-14 MED ORDER — FENTANYL CITRATE (PF) 100 MCG/2ML IJ SOLN
INTRAMUSCULAR | Status: AC | PRN
  Administered 2019-09-14 (×4): 25 ug via INTRAVENOUS

## 2019-09-14 MED ORDER — LIDOCAINE HCL (PF) 1 % IJ SOLN
INTRAMUSCULAR | Status: AC | PRN
  Administered 2019-09-14: 10 mL via INTRADERMAL

## 2019-09-14 MED ORDER — LIDOCAINE HCL 1 % IJ SOLN
INTRAMUSCULAR | Status: AC
  Filled 2019-09-14: qty 20

## 2019-09-14 MED ORDER — LIDOCAINE-EPINEPHRINE 1 %-1:100000 IJ SOLN
INTRAMUSCULAR | Status: AC
  Filled 2019-09-14: qty 1

## 2019-09-14 MED ORDER — SODIUM CHLORIDE 0.9 % IV SOLN: INTRAVENOUS | Status: DC

## 2019-09-14 MED ORDER — HEPARIN SOD (PORK) LOCK FLUSH 100 UNIT/ML IV SOLN
INTRAVENOUS | Status: AC
  Filled 2019-09-14: qty 5

## 2019-09-14 MED ORDER — LIDOCAINE-EPINEPHRINE 1 %-1:100000 IJ SOLN
INTRAMUSCULAR | Status: AC | PRN
  Administered 2019-09-14: 10 mL via INTRADERMAL

## 2019-09-14 MED ORDER — CEFAZOLIN SODIUM-DEXTROSE 2-4 GM/100ML-% IV SOLN
INTRAVENOUS | Status: AC
  Filled 2019-09-14: qty 100

## 2019-09-14 MED ORDER — HEPARIN SOD (PORK) LOCK FLUSH 100 UNIT/ML IV SOLN
INTRAVENOUS | Status: AC | PRN
  Administered 2019-09-14: 500 [IU] via INTRAVENOUS

## 2019-09-14 MED ORDER — FENTANYL CITRATE (PF) 100 MCG/2ML IJ SOLN
INTRAMUSCULAR | Status: AC
  Filled 2019-09-14: qty 2

## 2019-09-14 MED ORDER — MIDAZOLAM HCL 2 MG/2ML IJ SOLN
INTRAMUSCULAR | Status: AC | PRN
  Administered 2019-09-14 (×2): 0.5 mg via INTRAVENOUS
  Administered 2019-09-14: 1 mg via INTRAVENOUS

## 2019-09-14 NOTE — Progress Notes (Signed)
Notified PA and MD of low sats off O2 at 1325. Sats 80 % on room air. O2 at 2 L reapplied with sats 88%. Pt unable to discharge until 1430 with sats 88% on 0.5L O2 per Dr Anselm Pancoast and K. Allred PA. Explained to Pt that he had to stay a little longer as a safety precaution, however he was very upset.  Pt did not have portable O2 with him.

## 2019-09-14 NOTE — H&P (Signed)
Referring Physician(s): Ladell Pier  Supervising Physician: Markus Daft  Patient Status:  [facility] OP  Chief Complaint:  "I'm getting a port a cath"  Subjective: Patient familiar to IR service from liver lesion biopsy on 09/11/2018.  He is a smoker and has a history of COPD and likely metastatic lung cancer/adenocarcinoma with progressive disease, undergoing chemo and immunotherapy.  He presents again today for Port-A-Cath placement for additional treatment.  He denies fever, headache, chest pain, worsening dyspnea, cough, abdominal/back pain, nausea, vomiting or bleeding.  Past Medical History:  Diagnosis Date  . 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 COPD (chronic obstructive pulmonary disease) ([place])   . Dyspnea   . Hyperlipidemia   . Hypertension   . lung ca dx'd 08/2018   Past Surgical History:  Procedure Laterality Date  . BILIARY BRUSHING  09/09/2018   Procedure: BILIARY BRUSHING;  Surgeon: Ronnette Juniper, MD;  Location: [facility] ENDOSCOPY;  Service: Gastroenterology;;  . BILIARY STENT PLACEMENT  09/09/2018   Procedure: BILIARY STENT PLACEMENT;  Surgeon: Ronnette Juniper, MD;  Location: [place];  Service: Gastroenterology;;  . ERCP N/A 09/09/2018   Procedure: ENDOSCOPIC RETROGRADE CHOLANGIOPANCREATOGRAPHY (ERCP);  Surgeon: Ronnette Juniper, MD;  Location: [place];  Service: Gastroenterology;  Laterality: N/A;  . FOOT SURGERY    . HERNIA REPAIR     WITH MESH  . REMOVAL OF STONES  09/09/2018   Procedure: REMOVAL OF STONES;  Surgeon: Ronnette Juniper, MD;  Location: [place];  Service: Gastroenterology;;  . Joan Mayans  09/09/2018   Procedure: Joan Mayans;  Surgeon: Ronnette Juniper, MD;  Location: [facility] ENDOSCOPY;  Service: Gastroenterology;;      Allergies: Patient has no known allergies.  Medications: Prior to Admission medications   Medication Sig Start Date End Date Taking? Authorizing Provider  aspirin EC 81 MG tablet Take 81 mg by mouth daily.   Yes [provider]  folic acid (FOLVITE) 1 MG tablet TAKE 1 TABLET BY MOUTH EVERY DAY 06/09/19  Yes Owens Shark, NP  furosemide (LASIX) 20 MG tablet Take 20 mg by mouth daily. 06/29/18  Yes [provider]  levothyroxine (SYNTHROID) 50 MCG tablet Take 1 tablet (50 mcg total) by mouth daily before breakfast. 06/24/19  Yes Ladell Pier, MD  lisinopril (PRINIVIL,ZESTRIL) 40 MG tablet Take 40 mg by mouth daily. 06/24/18  Yes [provider]  Multiple Vitamins-Minerals (PRESERVISION AREDS PO) Take 1 tablet by mouth 2 (two) times a day.   Yes [provider]  prochlorperazine (COMPAZINE) 5 MG tablet Take 1 tablet (5 mg total) by mouth every 6 (six) hours as needed for nausea or vomiting. 10/03/18  Yes Owens Shark, NP  Saw Palmetto, Serenoa repens, (SAW PALMETTO PO) Take 1 capsule by mouth 2 (two) times daily.    Yes [provider]  simvastatin (ZOCOR) 40 MG tablet Take 20 mg by mouth daily at 6 PM.  06/08/18  Yes [provider]  bisacodyl (DULCOLAX) 5 MG EC tablet Take 5 mg by mouth daily as needed for moderate constipation.    [provider]     Vital Signs: BP (!) 151/81 (BP Location: Right Arm)   Pulse 83   Temp 98.1 F (36.7 C) (Oral)   Resp 17   SpO2 92%   Physical Exam awake, alert.  Chest with distant breath sounds bilaterally.  Heart with regular rate and rhythm.  Abdomen soft, positive bowel sounds, nontender.  No significant lower extremity edema.  Imaging: No results found.  Labs:  CBC: Recent Labs    07/31/19 0951 08/20/19 1200 09/10/19 0944 09/14/19 1015  WBC 12.9* 12.0* 11.6* 10.8*  HGB 16.2 16.3 16.8 17.0  HCT 48.0 48.1 49.1 50.5  PLT 307 393 342 310    COAGS: Recent Labs    12/24/18 1720 09/14/19 1015  INR 1.1 1.0    BMP: Recent Labs    07/09/19 1131 07/31/19 1000 08/20/19 1200 09/10/19 0944  NA 137 138 136 135  K 4.0 3.9 4.1 4.0  CL 97* 100 97* 98  CO2 29 29 29 28   GLUCOSE 99 141* 120*  125*  BUN 14 16 13 11   CALCIUM 8.6* 8.7* 8.7* 9.0  CREATININE 0.73 0.76 0.71 0.74  GFRNONAA >60 >60 >60 >60  GFRAA >60 >60 >60 >60    LIVER FUNCTION TESTS: Recent Labs    07/09/19 1131 07/31/19 1000 08/20/19 1200 09/10/19 0944  BILITOT 0.5 0.6 0.5 0.6  AST 38 31 32 32  ALT 35 31 32 27  ALKPHOS 111 123 147* 131*  PROT 7.1 7.0 7.4 7.1  ALBUMIN 3.5 3.3* 3.2* 3.2*    Assessment and Plan: 74 yo WM, smoker, with history of COPD and likely metastatic lung cancer/adenocarcinoma with progressive disease, undergoing chemo and immunotherapy.  He presents  today for Port-A-Cath placement for additional treatment. Risks and benefits of image guided port-a-catheter placement was discussed with the patient including, but not limited to bleeding, infection, pneumothorax, or fibrin sheath development and need for additional procedures.  All of the patient's questions were answered, patient is agreeable to proceed. Consent signed and in chart.     Electronically Signed: D. Rowe Robert, PA-C 09/14/2019, 10:50 AM   I spent a total of 25 minutes at the the patient's bedside AND on the patient's hospital floor or unit, greater than 50% of which was counseling/coordinating care for Port-A-Cath placement

## 2019-09-14 NOTE — Discharge Instructions (Addendum)
Urgent needs - IR on call MD [phone]  Wound - May remove dressing and shower in 24 to 48 hours.  Keep site clean and dry.  Replace with bandaid. Do not submerge in tub or water until site healing well.  If ordered by your provider, may start Emla cream in 2 weeks or after incision is healed.  After completion of treatment, your provider should have you set up for monthly port flushes.    Implanted Port Insertion, Care After This sheet gives you information about how to care for yourself after your procedure. Your health care provider may also give you more specific instructions. If you have problems or questions, contact your health care provider. What can I expect after the procedure? After the procedure, it is common to have:  Discomfort at the port insertion site.  Bruising on the skin over the port. This should improve over 3-4 days. Follow these instructions at home: [facility] care  After your port is placed, you will get a manufacturer's information card. The card has information about your port. Keep this card with you at all times.  Take care of the port as told by your health care provider. Ask your health care provider if you or a family member can get training for taking care of the port at home. A home health care nurse may also take care of the port.  Make sure to remember what type of port you have. Incision care   Follow instructions from your health care provider about how to take care of your port insertion site. Make sure you: ? Wash your hands with soap and water before and after you change your bandage (dressing). If soap and water are not available, use hand sanitizer. ? Change your dressing as told by your health care provider. ? Leave stitches (sutures), skin glue, or adhesive strips in place. These skin closures may need to stay in place for 2 weeks or longer. If adhesive strip edges start to loosen and curl up, you may trim the loose edges. Do not remove  adhesive strips completely unless your health care provider tells you to do that.  Check your port insertion site every day for signs of infection. Check for: ? Redness, swelling, or pain. ? Fluid or blood. ? Warmth. ? Pus or a bad smell. Activity  Return to your normal activities as told by your health care provider. Ask your health care provider what activities are safe for you.  Do not lift anything that is heavier than 10 lb (4.5 kg), or the limit that you are told, until your health care provider says that it is safe. General instructions  Take over-the-counter and prescription medicines only as told by your health care provider.  Do not take baths, swim, or use a hot tub until your health care provider approves. Ask your health care provider if you may take showers. You may only be allowed to take sponge baths.  Do not drive for 24 hours if you were given a sedative during your procedure.  Wear a medical alert bracelet in case of an emergency. This will tell any health care providers that you have a port.  Keep all follow-up visits as told by your health care provider. This is important. Contact a health care provider if:  You cannot flush your port with saline as directed, or you cannot draw blood from the port.  You have a fever or chills.  You have redness, swelling, or pain around your  port insertion site.  You have fluid or blood coming from your port insertion site.  Your port insertion site feels warm to the touch.  You have pus or a bad smell coming from the port insertion site. Get help right away if:  You have chest pain or shortness of breath.  You have bleeding from your port that you cannot control. Summary  Take care of the port as told by your health care provider. Keep the manufacturer's information card with you at all times.  Change your dressing as told by your health care provider.  Contact a health care provider if you have a fever or chills  or if you have redness, swelling, or pain around your port insertion site.  Keep all follow-up visits as told by your health care provider. This information is not intended to replace advice given to you by your health care provider. Make sure you discuss any questions you have with your health care provider. Document Revised: 01/07/2018 Document Reviewed: 01/07/2018 Elsevier Patient Education  2020 Else[employer]A  Moderate Conscious Sedation, Adult, Care After These instructions provide you with information about caring for yourself after your procedure. Your health care provider may also give you more specific instructions. Your treatment has been planned according to current medical practices, but problems sometimes occur. Call your health care provider if you have any problems or questions after your procedure. What can I expect after the procedure? After your procedure, it is common:  To feel sleepy for several hours.  To feel clumsy and have poor balance for several hours.  To have poor judgment for several hours.  To vomit if you eat too soon. Follow these instructions at home: For at least 24 hours after the procedure:  Do not: ? Participate in activities where you could fall or become injured. ? Drive. ? Use heavy machinery. ? Drink alcohol. ? Take sleeping pills or medicines that cause drowsiness. ? Make important decisions or sign legal documents. ? Take care of children on your own.  Rest. Eating and drinking  Follow the diet recommended by your health care provider.  If you vomit: ? Drink water, juice, or soup when you can drink without vomiting. ? Make sure you have little or no nausea before eating solid foods. General instructions  Have a responsible adult stay with you until you are awake and alert.  Take over-the-counter and prescription medicines only as told by your health care provider.  If you smoke, do not smoke without supervision.  Keep all  follow-up visits as told by your health care provider. This is important. Contact a health care provider if:  You keep feeling nauseous or you keep vomiting.  You feel light-headed.  You develop a rash.  You have a fever. Get help right away if:  You have trouble breathing. This information is not intended to replace advice given to you by your health care provider. Make sure you discuss any questions you have with your health care provider. Document Revised: 05/24/2017 Document Reviewed: 10/01/2015 Elsevier Patient Education  2020 [employer].

## 2019-09-14 NOTE — Procedures (Signed)
Interventional Radiology Procedure: ° ° °Indications: Metastatic lung cancer ° °Procedure: Port placement ° °Findings: Right jugular port, tip at SVC/RA junction ° °Complications: None °    °EBL: Minimal, less than 10 ml ° °Plan: Discharge in one hour.  Keep port site and incisions dry for at least 24 hours.   ° ° °Kaleisha Bhargava R. Ambera Fedele, MD  °Pager: [phone] ° °  °

## 2019-09-16 ENCOUNTER — Other Ambulatory Visit: Payer: Self-pay | Admitting: *Deleted

## 2019-09-16 ENCOUNTER — Other Ambulatory Visit: Payer: Self-pay | Admitting: Oncology

## 2019-09-16 MED ORDER — LIDOCAINE-PRILOCAINE 2.5-2.5 % EX CREA: 1.0000 "application " | TOPICAL_CREAM | CUTANEOUS | 1 refills | Status: DC | PRN

## 2019-09-17 ENCOUNTER — Inpatient Hospital Stay: Payer: BC Managed Care – PPO

## 2019-09-17 ENCOUNTER — Inpatient Hospital Stay (HOSPITAL_BASED_OUTPATIENT_CLINIC_OR_DEPARTMENT_OTHER): Payer: BC Managed Care – PPO | Admitting: Nurse Practitioner

## 2019-09-17 ENCOUNTER — Other Ambulatory Visit: Payer: Self-pay

## 2019-09-17 ENCOUNTER — Encounter: Payer: Self-pay | Admitting: Nurse Practitioner

## 2019-09-17 VITALS — BP 140/77 | HR 84 | Resp 17 | Ht 70.0 in | Wt 176.6 lb

## 2019-09-17 DIAGNOSIS — C3490 Malignant neoplasm of unspecified part of unspecified bronchus or lung: Secondary | ICD-10-CM | POA: Diagnosis not present

## 2019-09-17 DIAGNOSIS — Z5111 Encounter for antineoplastic chemotherapy: Secondary | ICD-10-CM | POA: Diagnosis not present

## 2019-09-17 MED ORDER — PROCHLORPERAZINE MALEATE 10 MG PO TABS
ORAL_TABLET | ORAL | Status: AC
  Filled 2019-09-17: qty 1

## 2019-09-17 MED ORDER — SODIUM CHLORIDE 0.9 % IV SOLN
2000.0000 mg | Freq: Once | INTRAVENOUS | Status: AC
  Administered 2019-09-17: 2000 mg via INTRAVENOUS
  Filled 2019-09-17: qty 52.6

## 2019-09-17 MED ORDER — SODIUM CHLORIDE 0.9% FLUSH
10.0000 mL | INTRAVENOUS | Status: DC | PRN
  Administered 2019-09-17: 10 mL
  Filled 2019-09-17: qty 10

## 2019-09-17 MED ORDER — SODIUM CHLORIDE 0.9 % IV SOLN
Freq: Once | INTRAVENOUS | Status: AC
  Filled 2019-09-17: qty 250

## 2019-09-17 MED ORDER — HEPARIN SOD (PORK) LOCK FLUSH 100 UNIT/ML IV SOLN
500.0000 [IU] | Freq: Once | INTRAVENOUS | Status: AC | PRN
  Administered 2019-09-17: 500 [IU]
  Filled 2019-09-17: qty 5

## 2019-09-17 MED ORDER — PROCHLORPERAZINE MALEATE 10 MG PO TABS
10.0000 mg | ORAL_TABLET | Freq: Once | ORAL | Status: AC
  Administered 2019-09-17: 10 mg via ORAL

## 2019-09-17 NOTE — Patient Instructions (Signed)
[facility] Discharge Instructions for Patients Receiving Chemotherapy  Today you received the following chemotherapy agents: gemcitabine.  To help prevent nausea and vomiting after your treatment, we encourage you to take your nausea medication as directed.   If you develop nausea and vomiting that is not controlled by your nausea medication, call the clinic.   BELOW ARE SYMPTOMS THAT SHOULD BE REPORTED IMMEDIATELY:  *FEVER GREATER THAN 100.5 F  *CHILLS WITH OR WITHOUT FEVER  NAUSEA AND VOMITING THAT IS NOT CONTROLLED WITH YOUR NAUSEA MEDICATION  *UNUSUAL SHORTNESS OF BREATH  *UNUSUAL BRUISING OR BLEEDING  TENDERNESS IN MOUTH AND THROAT WITH OR WITHOUT PRESENCE OF ULCERS  *URINARY PROBLEMS  *BOWEL PROBLEMS  UNUSUAL RASH Items with * indicate a potential emergency and should be followed up as soon as possible.  Feel free to call the clinic should you have any questions or concerns. The clinic phone number is [phone].  Please show the [facility] at check-in to the Emergency Department and triage nurse.  Gemcitabine injection What is this medicine? GEMCITABINE (jem SYE ta been) is a chemotherapy drug. This medicine is used to treat many types of cancer like breast cancer, lung cancer, pancreatic cancer, and ovarian cancer. This medicine may be used for other purposes; ask your health care provider or pharmacist if you have questions. COMMON BRAND NAME(S): Gemzar, Infugem What should I tell my health care provider before I take this medicine? They need to know if you have any of these conditions:  blood disorders  infection  kidney disease  liver disease  lung or breathing disease, like asthma  recent or ongoing radiation therapy  an unusual or allergic reaction to gemcitabine, other chemotherapy, other medicines, foods, dyes, or preservatives  pregnant or trying to get pregnant  breast-feeding How should I use this medicine? This  drug is given as an infusion into a vein. It is administered in a hospital or clinic by a specially trained health care professional. Talk to your pediatrician regarding the use of this medicine in children. Special care may be needed. Overdosage: If you think you have taken too much of this medicine contact a poison control center or emergency room at once. NOTE: This medicine is only for you. Do not share this medicine with others. What if I miss a dose? It is important not to miss your dose. Call your doctor or health care professional if you are unable to keep an appointment. What may interact with this medicine?  medicines to increase blood counts like filgrastim, pegfilgrastim, sargramostim  some other chemotherapy drugs like cisplatin  vaccines Talk to your doctor or health care professional before taking any of these medicines:  acetaminophen  aspirin  ibuprofen  ketoprofen  naproxen This list may not describe all possible interactions. Give your health care provider a list of all the medicines, herbs, non-prescription drugs, or dietary supplements you use. Also tell them if you smoke, drink alcohol, or use illegal drugs. Some items may interact with your medicine. What should I watch for while using this medicine? Visit your doctor for checks on your progress. This drug may make you feel generally unwell. This is not uncommon, as chemotherapy can affect healthy cells as well as cancer cells. Report any side effects. Continue your course of treatment even though you feel ill unless your doctor tells you to stop. In some cases, you may be given additional medicines to help with side effects. Follow all directions for their use. Call  your doctor or health care professional for advice if you get a fever, chills or sore throat, or other symptoms of a cold or flu. Do not treat yourself. This drug decreases your body's ability to fight infections. Try to avoid being around people who  are sick. This medicine may increase your risk to bruise or bleed. Call your doctor or health care professional if you notice any unusual bleeding. Be careful brushing and flossing your teeth or using a toothpick because you may get an infection or bleed more easily. If you have any dental work done, tell your dentist you are receiving this medicine. Avoid taking products that contain aspirin, acetaminophen, ibuprofen, naproxen, or ketoprofen unless instructed by your doctor. These medicines may hide a fever. Do not become pregnant while taking this medicine or for 6 months after stopping it. Women should inform their doctor if they wish to become pregnant or think they might be pregnant. Men should not father a child while taking this medicine and for 3 months after stopping it. There is a potential for serious side effects to an unborn child. Talk to your health care professional or pharmacist for more information. Do not breast-feed an infant while taking this medicine or for at least 1 week after stopping it. Men should inform their doctors if they wish to father a child. This medicine may lower sperm counts. Talk with your doctor or health care professional if you are concerned about your fertility. What side effects may I notice from receiving this medicine? Side effects that you should report to your doctor or health care professional as soon as possible:  allergic reactions like skin rash, itching or hives, swelling of the face, lips, or tongue  breathing problems  pain, redness, or irritation at site where injected  signs and symptoms of a dangerous change in heartbeat or heart rhythm like chest pain; dizziness; fast or irregular heartbeat; palpitations; feeling faint or lightheaded, falls; breathing problems  signs of decreased platelets or bleeding - bruising, pinpoint red spots on the skin, black, tarry stools, blood in the urine  signs of decreased red blood cells - unusually weak or  tired, feeling faint or lightheaded, falls  signs of infection - fever or chills, cough, sore throat, pain or difficulty passing urine  signs and symptoms of kidney injury like trouble passing urine or change in the amount of urine  signs and symptoms of liver injury like dark yellow or brown urine; general ill feeling or flu-like symptoms; light-colored stools; loss of appetite; nausea; right upper belly pain; unusually weak or tired; yellowing of the eyes or skin  swelling of ankles, feet, hands Side effects that usually do not require medical attention (report to your doctor or health care professional if they continue or are bothersome):  constipation  diarrhea  hair loss  loss of appetite  nausea  rash  vomiting This list may not describe all possible side effects. Call your doctor for medical advice about side effects. You may report side effects to FDA at 1-800-FDA-1088. Where should I keep my medicine? This drug is given in a hospital or clinic and will not be stored at home. NOTE: This sheet is a summary. It may not cover all possible information. If you have questions about this medicine, talk to your doctor, pharmacist, or health care provider.  2020 Elsevier/Gold Standard (2017-09-04 18:06:11)

## 2019-09-17 NOTE — Progress Notes (Addendum)
Ricky Conway OFFICE PROGRESS NOTE   Diagnosis: Non-small cell lung cancer  INTERVAL HISTORY:   Ricky Conway returns as scheduled.  Overall he is feeling better.  No nausea or vomiting.  No mouth sores.  No diarrhea.  He reports a good appetite.  No fever.  Stable dyspnea on exertion.  Symptoms are better.  Objective:  Vital signs in last 24 hours:  Blood pressure 140/77, pulse 84, resp. rate 17, height 5\' 10"  (1.778 m), weight 176 lb 9.6 oz (80.1 kg), SpO2 95 %.    HEENT: White coating over tongue.  No buccal thrush.  No ulcers. GI: Firm fullness right upper abdomen. Vascular: No leg edema. Neuro: Alert and oriented. Port-A-Cath without erythema.  Lab Results:  Lab Results  Component Value Date   WBC 10.8 (H) 09/14/2019   HGB 17.0 09/14/2019   HCT 50.5 09/14/2019   MCV 103.1 (H) 09/14/2019   PLT 310 09/14/2019   NEUTROABS 8.7 (H) 09/14/2019    Imaging:  No results found.  Medications: I have reviewed the patient's current medications.  Assessment/Plan: 1. Pancreas mass, liver lesions  Abdominal ultrasound 09/03/2018-3 liver lesions. Intra-and extrahepatic biliary ductal dilatation.  Presentation to the emergency department 09/08/2018 with painless jaundice. Initial bilirubin returned at 18.3.   MRI abdomen 09/08/2018-1.9 x 2.6 cm hypoenhancing lesion arising exophytically along the posterior aspect of the pancreatic head suspicious for pancreatic adenocarcinoma. There was suspected involvement of the distal common duct with secondary intrahepatic/extrahepatic ductal dilatation. Lesion noted to abut the IVC. Multiple liver metastases noted. Small upper abdominal/retroperitoneal lymph nodes noted.   ERCP 09/09/2018-high-grade stricture and obstruction of the distal common bile duct. A metallic biliary stent was placed. Brushings in the mid and lower third of the main bile duct were obtained with no malignant cells identified.   Bilirubin improved  at 4.7 on 09/10/2018.   Biopsy of a liver lesion on 09/11/2018-metastatic carcinoma, cytokeratin 7 and TTF-1 positive, negative for cytokeratin 20, CDX 2, and PSA.  CT chest 09/22/2018-right hilar/mediastinal/retrocrural lymphadenopathy, ill-defined right lower lobe nodularity, emphysema  Cycle 1 carboplatin/Alimta/pembrolizumab 10/09/2018  Cycle 2 carboplatin/Alimta/pembrolizumab 10/30/2018  Cycle 3 carboplatin/Alimta/pembrolizumab 11/20/2018  Cycle 4 carboplatin/Alimta/pembrolizumab 12/11/2018  CT 12/26/2018-marked response to therapy of the radical abdominal adenopathy, decreased size of hepatic metastases, no new or progressive disease  Cycle 5 Alimta/pembrolizumab 01/01/2019  Cycle 6 Alimta/pembrolizumab 01/23/2019  Cycle 7 Alimta/pembrolizumab 02/12/2019  Cycle 8 Alimta/pembrolizumab 03/05/2019  Cycle 9 Alimta/pembrolizumab 03/26/2019  CTs 04/14/2019-slight interval decrease in size and conspicuity of multiple, irregular low-attenuation liver lesions. No new liver lesions. No recurrent mediastinal or hilar lymphadenopathy. No change in mildly prominent subcentimeter periaortic and peripheral lymph nodes without evidence of recurrent abdominal or pelvic lymphadenopathy.  Cycle 10 Alimta/pembrolizumab 04/16/2019  Cycle 11 pembrolizumab 05/07/2019 (Alimta held secondary to increased tearing and leg edema)  Cycle 12 Alimta/pembrolizumab 05/28/2019  Cycle 13 Alimta/pembrolizumab 06/18/2019  Restaging CTs 07/08/2019-worsening of low-density hepatic lesions. Scattered small lymph nodes with increasing conspicuity, largest along the left periaortic chain approximately 9 mm previously approximately 5 mm.  Cycle 14 Alimta/pembrolizumab 07/09/2019  Cycle 15 Alimta/pembrolizumab 07/31/2019  Cycle 16 Alimta/pembrolizumab 08/20/2019  Rising CEA beginning December 2020  CTs 09/08/2019-enlargement of liver metastases, mild enlargement of abdominal/retroperitoneal and lower thoracic nodes,  nonspecific pulmonary nodules-stable  Cycle 1 gemcitabine 09/17/2019 2. Biliary obstruction status post stent placement 3. COPD 4. Hypertension 5. Trace edema right leg 12/11/2018. Doppler negative for DVT 12/12/2018. 6. Watery eyes, runny nose reported 12/11/2018. Question related to Alimta. Persistent watery  eyes 04/16/2019. 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 Admission 12/24/2018 with a fever-no source for infection identified, completed outpatient course of ciprofloxacin 8. Port-A-Cath placement 09/14/2019, interventional radiology 9. Colonoscopy 06/16/2015-diverticulosis in the sigmoid colon, examination otherwise normal   Disposition: Ricky Conway appears unchanged.  He is scheduled to begin treatment with gemcitabine today.  We again reviewed potential toxicities.  He agrees to proceed.  I reviewed the labs from 09/10/2019 and 09/14/2019, adequate to proceed with treatment.  He will return for lab, follow-up, cycle 2 gemcitabine in 2 weeks.  He will contact the office in the interim with any problems.  Patient seen with Dr. Benay Conway.  Ricky Conway ANP/GNP-BC   09/17/2019  2:07 PM  This was a shared visit with Ricky Conway.  Ricky Conway underwent Port-A-Cath placement and will begin treatment with gemcitabine today.  We reviewed the diagnosis and treatment options with Ricky Conway.  His wife was present for today's visit.  He understands the primary tumor site has not been clearly defined.  He most likely has non-small cell lung cancer, but it is possible he has an upper gastrointestinal primary.  Ricky Conway, Ricky Conway

## 2019-09-21 ENCOUNTER — Telehealth: Payer: Self-pay | Admitting: Oncology

## 2019-09-21 NOTE — Telephone Encounter (Signed)
Scheduled per los. Called and left msg about added lab/flush on 4/8 and added 4/22 appt. Mailed printout

## 2019-09-24 ENCOUNTER — Encounter (HOSPITAL_COMMUNITY): Payer: Self-pay | Admitting: Oncology

## 2019-09-25 NOTE — Progress Notes (Signed)
Pharmacist Chemotherapy Monitoring - Follow Up Assessment    I verify that I have reviewed each item in the below checklist:  . Regimen for the patient is scheduled for the appropriate day and plan matches scheduled date. Marland Kitchen Appropriate non-routine labs are ordered dependent on drug ordered. . If applicable, additional medications reviewed and ordered per protocol based on lifetime cumulative doses and/or treatment regimen.   Plan for follow-up and/or issues identified: No . I-vent associated with next due treatment: No . MD and/or nursing notified: No  Ricky Conway K 09/25/2019 8:40 AM

## 2019-09-27 ENCOUNTER — Other Ambulatory Visit: Payer: Self-pay | Admitting: Oncology

## 2019-10-01 ENCOUNTER — Inpatient Hospital Stay: Payer: Medicare Other

## 2019-10-01 ENCOUNTER — Encounter: Payer: Self-pay | Admitting: Nurse Practitioner

## 2019-10-01 ENCOUNTER — Inpatient Hospital Stay: Payer: Medicare Other | Attending: Oncology

## 2019-10-01 ENCOUNTER — Inpatient Hospital Stay (HOSPITAL_BASED_OUTPATIENT_CLINIC_OR_DEPARTMENT_OTHER): Payer: Medicare Other | Admitting: Nurse Practitioner

## 2019-10-01 ENCOUNTER — Other Ambulatory Visit: Payer: Self-pay

## 2019-10-01 VITALS — BP 120/63 | HR 83 | Temp 98.9°F | Resp 18 | Ht 70.0 in | Wt 173.6 lb

## 2019-10-01 DIAGNOSIS — C3431 Malignant neoplasm of lower lobe, right bronchus or lung: Secondary | ICD-10-CM | POA: Diagnosis present

## 2019-10-01 DIAGNOSIS — C3490 Malignant neoplasm of unspecified part of unspecified bronchus or lung: Secondary | ICD-10-CM

## 2019-10-01 DIAGNOSIS — C787 Secondary malignant neoplasm of liver and intrahepatic bile duct: Secondary | ICD-10-CM | POA: Diagnosis present

## 2019-10-01 DIAGNOSIS — Z5111 Encounter for antineoplastic chemotherapy: Secondary | ICD-10-CM | POA: Insufficient documentation

## 2019-10-01 DIAGNOSIS — Z95828 Presence of other vascular implants and grafts: Secondary | ICD-10-CM | POA: Insufficient documentation

## 2019-10-01 LAB — CBC WITH DIFFERENTIAL (CANCER CENTER ONLY)
Abs Immature Granulocytes: 0.06 10*3/uL (ref 0.00–0.07)
Basophils Absolute: 0.1 10*3/uL (ref 0.0–0.1)
Basophils Relative: 0 %
Eosinophils Absolute: 0.3 10*3/uL (ref 0.0–0.5)
Eosinophils Relative: 2 %
HCT: 47.2 % (ref 39.0–52.0)
Hemoglobin: 15.9 g/dL (ref 13.0–17.0)
Immature Granulocytes: 1 %
Lymphocytes Relative: 9 %
Lymphs Abs: 1 10*3/uL (ref 0.7–4.0)
MCH: 34.9 pg — ABNORMAL HIGH (ref 26.0–34.0)
MCHC: 33.7 g/dL (ref 30.0–36.0)
MCV: 103.5 fL — ABNORMAL HIGH (ref 80.0–100.0)
Monocytes Absolute: 1.1 10*3/uL — ABNORMAL HIGH (ref 0.1–1.0)
Monocytes Relative: 10 %
Neutro Abs: 8.8 10*3/uL — ABNORMAL HIGH (ref 1.7–7.7)
Neutrophils Relative %: 78 %
Platelet Count: 358 10*3/uL (ref 150–400)
RBC: 4.56 MIL/uL (ref 4.22–5.81)
RDW: 14.6 % (ref 11.5–15.5)
WBC Count: 11.2 10*3/uL — ABNORMAL HIGH (ref 4.0–10.5)
nRBC: 0 % (ref 0.0–0.2)

## 2019-10-01 LAB — CMP (CANCER CENTER ONLY)
ALT: 29 U/L (ref 0–44)
AST: 30 U/L (ref 15–41)
Albumin: 3.2 g/dL — ABNORMAL LOW (ref 3.5–5.0)
Alkaline Phosphatase: 140 U/L — ABNORMAL HIGH (ref 38–126)
Anion gap: 7 (ref 5–15)
BUN: 12 mg/dL (ref 8–23)
CO2: 31 mmol/L (ref 22–32)
Calcium: 9 mg/dL (ref 8.9–10.3)
Chloride: 97 mmol/L — ABNORMAL LOW (ref 98–111)
Creatinine: 0.74 mg/dL (ref 0.61–1.24)
GFR, Est AFR Am: >60 mL/min (ref >60)
GFR, Estimated: >60 mL/min (ref >60)
Glucose, Bld: 147 mg/dL — ABNORMAL HIGH (ref 70–99)
Potassium: 4 mmol/L (ref 3.5–5.1)
Sodium: 135 mmol/L (ref 135–145)
Total Bilirubin: 0.3 mg/dL (ref 0.3–1.2)
Total Protein: 7 g/dL (ref 6.5–8.1)

## 2019-10-01 MED ORDER — PROCHLORPERAZINE MALEATE 10 MG PO TABS
10.0000 mg | ORAL_TABLET | Freq: Once | ORAL | Status: AC
  Administered 2019-10-01: 10 mg via ORAL

## 2019-10-01 MED ORDER — SODIUM CHLORIDE 0.9% FLUSH
10.0000 mL | INTRAVENOUS | Status: DC | PRN
  Administered 2019-10-01: 10 mL
  Filled 2019-10-01: qty 10

## 2019-10-01 MED ORDER — SODIUM CHLORIDE 0.9 % IV SOLN
2000.0000 mg | Freq: Once | INTRAVENOUS | Status: AC
  Administered 2019-10-01: 2000 mg via INTRAVENOUS
  Filled 2019-10-01: qty 52.6

## 2019-10-01 MED ORDER — HEPARIN SOD (PORK) LOCK FLUSH 100 UNIT/ML IV SOLN
500.0000 [IU] | Freq: Once | INTRAVENOUS | Status: AC | PRN
  Administered 2019-10-01: 500 [IU]
  Filled 2019-10-01: qty 5

## 2019-10-01 MED ORDER — SODIUM CHLORIDE 0.9 % IV SOLN
Freq: Once | INTRAVENOUS | Status: AC
  Filled 2019-10-01: qty 250

## 2019-10-01 MED ORDER — PROCHLORPERAZINE MALEATE 10 MG PO TABS
ORAL_TABLET | ORAL | Status: AC
  Filled 2019-10-01: qty 1

## 2019-10-01 NOTE — Patient Instructions (Signed)
[facility] Discharge Instructions for Patients Receiving Chemotherapy  Today you received the following chemotherapy agents: gemcitabine.  To help prevent nausea and vomiting after your treatment, we encourage you to take your nausea medication as directed.   If you develop nausea and vomiting that is not controlled by your nausea medication, call the clinic.   BELOW ARE SYMPTOMS THAT SHOULD BE REPORTED IMMEDIATELY:  *FEVER GREATER THAN 100.5 F  *CHILLS WITH OR WITHOUT FEVER  NAUSEA AND VOMITING THAT IS NOT CONTROLLED WITH YOUR NAUSEA MEDICATION  *UNUSUAL SHORTNESS OF BREATH  *UNUSUAL BRUISING OR BLEEDING  TENDERNESS IN MOUTH AND THROAT WITH OR WITHOUT PRESENCE OF ULCERS  *URINARY PROBLEMS  *BOWEL PROBLEMS  UNUSUAL RASH Items with * indicate a potential emergency and should be followed up as soon as possible.  Feel free to call the clinic should you have any questions or concerns. The clinic phone number is [phone].  Please show the [facility] at check-in to the Emergency Department and triage nurse.  Gemcitabine injection What is this medicine? GEMCITABINE (jem SYE ta been) is a chemotherapy drug. This medicine is used to treat many types of cancer like breast cancer, lung cancer, pancreatic cancer, and ovarian cancer. This medicine may be used for other purposes; ask your health care provider or pharmacist if you have questions. COMMON BRAND NAME(S): Gemzar, Infugem What should I tell my health care provider before I take this medicine? They need to know if you have any of these conditions:  blood disorders  infection  kidney disease  liver disease  lung or breathing disease, like asthma  recent or ongoing radiation therapy  an unusual or allergic reaction to gemcitabine, other chemotherapy, other medicines, foods, dyes, or preservatives  pregnant or trying to get pregnant  breast-feeding How should I use this medicine? This  drug is given as an infusion into a vein. It is administered in a hospital or clinic by a specially trained health care professional. Talk to your pediatrician regarding the use of this medicine in children. Special care may be needed. Overdosage: If you think you have taken too much of this medicine contact a poison control center or emergency room at once. NOTE: This medicine is only for you. Do not share this medicine with others. What if I miss a dose? It is important not to miss your dose. Call your doctor or health care professional if you are unable to keep an appointment. What may interact with this medicine?  medicines to increase blood counts like filgrastim, pegfilgrastim, sargramostim  some other chemotherapy drugs like cisplatin  vaccines Talk to your doctor or health care professional before taking any of these medicines:  acetaminophen  aspirin  ibuprofen  ketoprofen  naproxen This list may not describe all possible interactions. Give your health care provider a list of all the medicines, herbs, non-prescription drugs, or dietary supplements you use. Also tell them if you smoke, drink alcohol, or use illegal drugs. Some items may interact with your medicine. What should I watch for while using this medicine? Visit your doctor for checks on your progress. This drug may make you feel generally unwell. This is not uncommon, as chemotherapy can affect healthy cells as well as cancer cells. Report any side effects. Continue your course of treatment even though you feel ill unless your doctor tells you to stop. In some cases, you may be given additional medicines to help with side effects. Follow all directions for their use. Call  your doctor or health care professional for advice if you get a fever, chills or sore throat, or other symptoms of a cold or flu. Do not treat yourself. This drug decreases your body's ability to fight infections. Try to avoid being around people who  are sick. This medicine may increase your risk to bruise or bleed. Call your doctor or health care professional if you notice any unusual bleeding. Be careful brushing and flossing your teeth or using a toothpick because you may get an infection or bleed more easily. If you have any dental work done, tell your dentist you are receiving this medicine. Avoid taking products that contain aspirin, acetaminophen, ibuprofen, naproxen, or ketoprofen unless instructed by your doctor. These medicines may hide a fever. Do not become pregnant while taking this medicine or for 6 months after stopping it. Women should inform their doctor if they wish to become pregnant or think they might be pregnant. Men should not father a child while taking this medicine and for 3 months after stopping it. There is a potential for serious side effects to an unborn child. Talk to your health care professional or pharmacist for more information. Do not breast-feed an infant while taking this medicine or for at least 1 week after stopping it. Men should inform their doctors if they wish to father a child. This medicine may lower sperm counts. Talk with your doctor or health care professional if you are concerned about your fertility. What side effects may I notice from receiving this medicine? Side effects that you should report to your doctor or health care professional as soon as possible:  allergic reactions like skin rash, itching or hives, swelling of the face, lips, or tongue  breathing problems  pain, redness, or irritation at site where injected  signs and symptoms of a dangerous change in heartbeat or heart rhythm like chest pain; dizziness; fast or irregular heartbeat; palpitations; feeling faint or lightheaded, falls; breathing problems  signs of decreased platelets or bleeding - bruising, pinpoint red spots on the skin, black, tarry stools, blood in the urine  signs of decreased red blood cells - unusually weak or  tired, feeling faint or lightheaded, falls  signs of infection - fever or chills, cough, sore throat, pain or difficulty passing urine  signs and symptoms of kidney injury like trouble passing urine or change in the amount of urine  signs and symptoms of liver injury like dark yellow or brown urine; general ill feeling or flu-like symptoms; light-colored stools; loss of appetite; nausea; right upper belly pain; unusually weak or tired; yellowing of the eyes or skin  swelling of ankles, feet, hands Side effects that usually do not require medical attention (report to your doctor or health care professional if they continue or are bothersome):  constipation  diarrhea  hair loss  loss of appetite  nausea  rash  vomiting This list may not describe all possible side effects. Call your doctor for medical advice about side effects. You may report side effects to FDA at 1-800-FDA-1088. Where should I keep my medicine? This drug is given in a hospital or clinic and will not be stored at home. NOTE: This sheet is a summary. It may not cover all possible information. If you have questions about this medicine, talk to your doctor, pharmacist, or health care provider.  2020 Elsevier/Gold Standard (2017-09-04 18:06:11)

## 2019-10-01 NOTE — Progress Notes (Addendum)
[facility] OFFICE PROGRESS NOTE   Diagnosis: Non-small cell lung cancer  INTERVAL HISTORY:   Ricky Conway returns as scheduled.  He completed cycle 1 gemcitabine 09/17/2019.  He denies nausea/vomiting.  No mouth sores.  No diarrhea.  No fever.  No rash.  No change in baseline dyspnea on exertion.  No cough.  He has been feeling more anxious recently.  A nap relieves the anxious feeling.  Objective:  Vital signs in last 24 hours:  Blood pressure 120/63, pulse 83, temperature 98.9 F (37.2 C), temperature source Temporal, resp. rate 18, height '5\' 10"'  (1.778 m), weight 173 lb 9.6 oz (78.7 kg), SpO2 94 %.    HEENT: No thrush or ulcers. Resp: Distant breath sounds.  Lungs are clear.  No respiratory distress. Cardio: Regular rate and rhythm. GI: Abdomen firm fullness throughout the right upper abdomen.  Nontender. Vascular: No leg edema. Neuro: Alert and oriented. Port-A-Cath without erythema.  Lab Results:  Lab Results  Component Value Date   WBC 11.2 (H) 10/01/2019   HGB 15.9 10/01/2019   HCT 47.2 10/01/2019   MCV 103.5 (H) 10/01/2019   PLT 358 10/01/2019   NEUTROABS 8.8 (H) 10/01/2019    Imaging:  No results found.  Medications: I have reviewed the patient's current medications.  Assessment/Plan: 1. Pancreas mass, liver lesions  Abdominal ultrasound 09/03/2018-3 liver lesions. Intra-and extrahepatic biliary ductal dilatation.  Presentation to the emergency department 09/08/2018 with painless jaundice. Initial bilirubin returned at 18.3.   MRI abdomen 09/08/2018-1.9 x 2.6 cm hypoenhancing lesion arising exophytically along the posterior aspect of the pancreatic head suspicious for pancreatic adenocarcinoma. There was suspected involvement of the distal common duct with secondary intrahepatic/extrahepatic ductal dilatation. Lesion noted to abut the IVC. Multiple liver metastases noted. Small upper abdominal/retroperitoneal lymph nodes noted.   ERCP  09/09/2018-high-grade stricture and obstruction of the distal common bile duct. A metallic biliary stent was placed. Brushings in the mid and lower third of the main bile duct were obtained with no malignant cells identified.   Bilirubin improved at 4.7 on 09/10/2018.   Biopsy of a liver lesion on 09/11/2018-metastatic carcinoma, cytokeratin 7 and TTF-1 positive, negative for cytokeratin 20, CDX 2, and PSA.  Foundation 1-microsatellite stable, tumor mutation burden 16, ERBB2 amplification  CT chest 09/22/2018-right hilar/mediastinal/retrocrural lymphadenopathy, ill-defined right lower lobe nodularity, emphysema  Cycle 1 carboplatin/Alimta/pembrolizumab 10/09/2018  Cycle 2 carboplatin/Alimta/pembrolizumab 10/30/2018  Cycle 3 carboplatin/Alimta/pembrolizumab 11/20/2018  Cycle 4 carboplatin/Alimta/pembrolizumab 12/11/2018  CT 12/26/2018-marked response to therapy of the radical abdominal adenopathy, decreased size of hepatic metastases, no new or progressive disease  Cycle 5 Alimta/pembrolizumab 01/01/2019  Cycle 6 Alimta/pembrolizumab 01/23/2019  Cycle 7 Alimta/pembrolizumab 02/12/2019  Cycle 8 Alimta/pembrolizumab 03/05/2019  Cycle 9 Alimta/pembrolizumab 03/26/2019  CTs 04/14/2019-slight interval decrease in size and conspicuity of multiple, irregular low-attenuation liver lesions. No new liver lesions. No recurrent mediastinal or hilar lymphadenopathy. No change in mildly prominent subcentimeter periaortic and peripheral lymph nodes without evidence of recurrent abdominal or pelvic lymphadenopathy.  Cycle 10 Alimta/pembrolizumab 04/16/2019  Cycle 11 pembrolizumab 05/07/2019 (Alimta held secondary to increased tearing and leg edema)  Cycle 12 Alimta/pembrolizumab 05/28/2019  Cycle 13 Alimta/pembrolizumab 06/18/2019  Restaging CTs 07/08/2019-worsening of low-density hepatic lesions. Scattered small lymph nodes with increasing conspicuity, largest along the left periaortic chain  approximately 9 mm previously approximately 5 mm.  Cycle 14 Alimta/pembrolizumab 07/09/2019  Cycle 15 Alimta/pembrolizumab 07/31/2019  Cycle 16 Alimta/pembrolizumab 08/20/2019  Rising CEA beginning December 2020  CTs 09/08/2019-enlargement of liver metastases, mild enlargement of abdominal/retroperitoneal  and lower thoracic nodes, nonspecific pulmonary nodules-stable  Cycle 1 gemcitabine 09/17/2019  Cycle 2 gemcitabine 10/01/2019 2. Biliary obstruction status post stent placement 3. COPD 4. Hypertension 5. Trace edema right leg 12/11/2018. Doppler negative for DVT 12/12/2018. 6. Watery eyes, runny nose reported 12/11/2018. Question related to Alimta. Persistent watery eyes 04/16/2019. 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 Admission 12/24/2018 with a fever-no source for infection identified, completed outpatient course of ciprofloxacin 8. Port-A-Cath placement 09/14/2019, interventional radiology 9. Colonoscopy 06/16/2015-diverticulosis in the sigmoid colon, examination otherwise normal   Disposition: Ricky Conway appears unchanged.  He has completed 1 cycle of gemcitabine.  He seems to have tolerated well.  Plan to proceed with cycle 2 today as scheduled.   We reviewed the CBC from today.  Counts adequate to proceed with treatment.  He is experiencing some anxiety.  We discussed low-dose of an antianxiety medication.  He prefers to hold on this for now.  He will contact the office if he changes his mind.  He will return for lab, follow-up, cycle 3 gemcitabine in 2 weeks.  He will contact the office in the interim as outlined above or with any other problems.  Patient seen with Dr. Benay Spice.  Ned Card ANP/GNP-BC   10/01/2019  2:01 PM  This was a shared visit with Ned Card.  Ricky Conway tolerated the first cycle of gemcitabine well.  He will complete cycle 2 today.  We reviewed results of Foundation 1 testing with him  today.  Julieanne Manson, MD

## 2019-10-09 NOTE — Progress Notes (Signed)
Pharmacist Chemotherapy Monitoring - Follow Up Assessment    I verify that I have reviewed each item in the below checklist:  . Regimen for the patient is scheduled for the appropriate day and plan matches scheduled date. Marland Kitchen Appropriate non-routine labs are ordered dependent on drug ordered. . If applicable, additional medications reviewed and ordered per protocol based on lifetime cumulative doses and/or treatment regimen.   Plan for follow-up and/or issues identified: No . I-vent associated with next due treatment: No . MD and/or nursing notified: No   Kennith Center, Pharm.D., CPP 10/09/2019@2 :08 PM

## 2019-10-11 ENCOUNTER — Other Ambulatory Visit: Payer: Self-pay | Admitting: Oncology

## 2019-10-15 ENCOUNTER — Telehealth: Payer: Self-pay | Admitting: Oncology

## 2019-10-15 ENCOUNTER — Inpatient Hospital Stay: Payer: Medicare Other

## 2019-10-15 ENCOUNTER — Other Ambulatory Visit: Payer: Self-pay

## 2019-10-15 ENCOUNTER — Inpatient Hospital Stay (HOSPITAL_BASED_OUTPATIENT_CLINIC_OR_DEPARTMENT_OTHER): Payer: Medicare Other | Admitting: Oncology

## 2019-10-15 VITALS — BP 132/61 | HR 84 | Temp 98.0°F | Resp 20 | Ht 70.0 in | Wt 175.6 lb

## 2019-10-15 DIAGNOSIS — C3490 Malignant neoplasm of unspecified part of unspecified bronchus or lung: Secondary | ICD-10-CM

## 2019-10-15 DIAGNOSIS — Z5111 Encounter for antineoplastic chemotherapy: Secondary | ICD-10-CM | POA: Diagnosis not present

## 2019-10-15 LAB — CMP (CANCER CENTER ONLY)
ALT: 33 U/L (ref 0–44)
AST: 30 U/L (ref 15–41)
Albumin: 3.2 g/dL — ABNORMAL LOW (ref 3.5–5.0)
Alkaline Phosphatase: 115 U/L (ref 38–126)
Anion gap: 8 (ref 5–15)
BUN: 8 mg/dL (ref 8–23)
CO2: 29 mmol/L (ref 22–32)
Calcium: 8.8 mg/dL — ABNORMAL LOW (ref 8.9–10.3)
Chloride: 98 mmol/L (ref 98–111)
Creatinine: 0.7 mg/dL (ref 0.61–1.24)
GFR, Est AFR Am: >60 mL/min (ref >60)
GFR, Estimated: >60 mL/min (ref >60)
Glucose, Bld: 117 mg/dL — ABNORMAL HIGH (ref 70–99)
Potassium: 3.6 mmol/L (ref 3.5–5.1)
Sodium: 135 mmol/L (ref 135–145)
Total Bilirubin: 0.6 mg/dL (ref 0.3–1.2)
Total Protein: 6.9 g/dL (ref 6.5–8.1)

## 2019-10-15 LAB — CBC WITH DIFFERENTIAL (CANCER CENTER ONLY)
Abs Immature Granulocytes: 0.03 10*3/uL (ref 0.00–0.07)
Basophils Absolute: 0 10*3/uL (ref 0.0–0.1)
Basophils Relative: 0 %
Eosinophils Absolute: 0.1 10*3/uL (ref 0.0–0.5)
Eosinophils Relative: 1 %
HCT: 46.7 % (ref 39.0–52.0)
Hemoglobin: 16 g/dL (ref 13.0–17.0)
Immature Granulocytes: 0 %
Lymphocytes Relative: 5 %
Lymphs Abs: 0.6 10*3/uL — ABNORMAL LOW (ref 0.7–4.0)
MCH: 35 pg — ABNORMAL HIGH (ref 26.0–34.0)
MCHC: 34.3 g/dL (ref 30.0–36.0)
MCV: 102.2 fL — ABNORMAL HIGH (ref 80.0–100.0)
Monocytes Absolute: 1.1 10*3/uL — ABNORMAL HIGH (ref 0.1–1.0)
Monocytes Relative: 10 %
Neutro Abs: 9.5 10*3/uL — ABNORMAL HIGH (ref 1.7–7.7)
Neutrophils Relative %: 84 %
Platelet Count: 180 10*3/uL (ref 150–400)
RBC: 4.57 MIL/uL (ref 4.22–5.81)
RDW: 14.6 % (ref 11.5–15.5)
WBC Count: 11.4 10*3/uL — ABNORMAL HIGH (ref 4.0–10.5)
nRBC: 0 % (ref 0.0–0.2)

## 2019-10-15 LAB — CEA (IN HOUSE-CHCC): CEA (CHCC-In House): 2534.55 ng/mL — ABNORMAL HIGH (ref 0.00–5.00)

## 2019-10-15 MED ORDER — HEPARIN SOD (PORK) LOCK FLUSH 100 UNIT/ML IV SOLN
500.0000 [IU] | Freq: Once | INTRAVENOUS | Status: AC | PRN
  Administered 2019-10-15: 500 [IU]
  Filled 2019-10-15: qty 5

## 2019-10-15 MED ORDER — PROCHLORPERAZINE MALEATE 10 MG PO TABS
ORAL_TABLET | ORAL | Status: AC
  Filled 2019-10-15: qty 1

## 2019-10-15 MED ORDER — SODIUM CHLORIDE 0.9 % IV SOLN
2000.0000 mg | Freq: Once | INTRAVENOUS | Status: AC
  Administered 2019-10-15: 2000 mg via INTRAVENOUS
  Filled 2019-10-15: qty 52.6

## 2019-10-15 MED ORDER — SODIUM CHLORIDE 0.9 % IV SOLN
Freq: Once | INTRAVENOUS | Status: DC
  Filled 2019-10-15: qty 250

## 2019-10-15 MED ORDER — SODIUM CHLORIDE 0.9 % IV SOLN
Freq: Once | INTRAVENOUS | Status: AC
  Filled 2019-10-15: qty 250

## 2019-10-15 MED ORDER — SODIUM CHLORIDE 0.9% FLUSH
10.0000 mL | INTRAVENOUS | Status: DC | PRN
  Administered 2019-10-15: 10 mL
  Filled 2019-10-15: qty 10

## 2019-10-15 MED ORDER — PROCHLORPERAZINE MALEATE 10 MG PO TABS
10.0000 mg | ORAL_TABLET | Freq: Once | ORAL | Status: AC
  Administered 2019-10-15: 10 mg via ORAL

## 2019-10-15 NOTE — Patient Instructions (Signed)
[facility] Discharge Instructions for Patients Receiving Chemotherapy  Today you received the following chemotherapy agent: Gemcitabine (gemzar)  To help prevent nausea and vomiting after your treatment, we encourage you to take your nausea medication as directed by your MD.   If you develop nausea and vomiting that is not controlled by your nausea medication, call the clinic.   BELOW ARE SYMPTOMS THAT SHOULD BE REPORTED IMMEDIATELY:  *FEVER GREATER THAN 100.5 F  *CHILLS WITH OR WITHOUT FEVER  NAUSEA AND VOMITING THAT IS NOT CONTROLLED WITH YOUR NAUSEA MEDICATION  *UNUSUAL SHORTNESS OF BREATH  *UNUSUAL BRUISING OR BLEEDING  TENDERNESS IN MOUTH AND THROAT WITH OR WITHOUT PRESENCE OF ULCERS  *URINARY PROBLEMS  *BOWEL PROBLEMS  UNUSUAL RASH Items with * indicate a potential emergency and should be followed up as soon as possible.  Feel free to call the clinic should you have any questions or concerns. The clinic phone number is [phone].  Please show the [facility] at check-in to the Emergency Department and triage nurse.  Coronavirus (COVID-19) Are you at risk?  Are you at risk for the Coronavirus (COVID-19)?  To be considered HIGH RISK for Coronavirus (COVID-19), you have to meet the following criteria:  . Traveled to [place], [place], [place], [place] or [place]; or in the [place] to [place], [place], [place], or [place]; and have fever, cough, and shortness of breath within the last 2 weeks of travel OR . Been in close contact with a person diagnosed with COVID-19 within the last 2 weeks and have fever, cough, and shortness of breath . IF YOU DO NOT MEET THESE CRITERIA, YOU ARE CONSIDERED LOW RISK FOR COVID-19.  What to do if you are HIGH RISK for COVID-19?  Marland Kitchen If you are having a medical emergency, call 911. . Seek medical care right away. Before you go to a doctor's office, urgent care or emergency department, call ahead and tell  them about your recent travel, contact with someone diagnosed with COVID-19, and your symptoms. You should receive instructions from your physician's office regarding next steps of care.  . When you arrive at healthcare provider, tell the healthcare staff immediately you have returned from visiting [place], [place], [place], [place] or [place]; or traveled in the [place] to [place], [place], [place], or [place]; in the last two weeks or you have been in close contact with a person diagnosed with COVID-19 in the last 2 weeks.   . Tell the health care staff about your symptoms: fever, cough and shortness of breath. . After you have been seen by a medical provider, you will be either: o Tested for (COVID-19) and discharged home on quarantine except to seek medical care if symptoms worsen, and asked to  - Stay home and avoid contact with others until you get your results (4-5 days)  - Avoid travel on public transportation if possible (such as bus, train, or airplane) or o Sent to the Emergency Department by EMS for evaluation, COVID-19 testing, and possible admission depending on your condition and test results.  What to do if you are LOW RISK for COVID-19?  Reduce your risk of any infection by using the same precautions used for avoiding the common cold or flu:  Marland Kitchen Wash your hands often with soap and warm water for at least 20 seconds.  If soap and water are not readily available, use an alcohol-based hand sanitizer with at least 60% alcohol.  Marland Kitchen  If coughing or sneezing, cover your mouth and nose by coughing or sneezing into the elbow areas of your shirt or coat, into a tissue or into your sleeve (not your hands). . Avoid shaking hands with others and consider head nods or verbal greetings only. . Avoid touching your eyes, nose, or mouth with unwashed hands.  . Avoid close contact with people who are sick. . Avoid places or events with large numbers of people in one location, like concerts or  sporting events. . Carefully consider travel plans you have or are making. . If you are planning any travel outside or inside the [place], visit the CDC's Travelers' Health webpage for the latest health notices. . If you have some symptoms but not all symptoms, continue to monitor at home and seek medical attention if your symptoms worsen. . If you are having a medical emergency, call 911.   [facility] / e-Visit: [email or web address]         [facility]: [facility]: [phone]                   [facility]: [phone]

## 2019-10-15 NOTE — Progress Notes (Signed)
[facility] OFFICE PROGRESS NOTE   Diagnosis: Non-small cell carcinoma  INTERVAL HISTORY:   Ricky Conway returns as scheduled.  He completed another treatment with gemcitabine on 10/01/2019.  No rash or fever. He has noted tenderness at the nipple bilaterally for the past few weeks.  Good appetite.  Stable exertional dyspnea. Objective:  Vital signs in last 24 hours:  Blood pressure 132/61, pulse 84, temperature 98 F (36.7 C), temperature source Temporal, resp. rate 20, height 5' 10" (1.778 m), weight 175 lb 9.6 oz (79.7 kg), SpO2 94 %.    Resp: Distant breath sounds, no respiratory distress Cardio: Regular rate and rhythm GI: Fullness in the right upper abdomen without a discrete liver edge, no splenomegaly Vascular: No leg edema Skin: Bilateral nipple without apparent abnormality, resolving ecchymosis over the upper right breast  Portacath/PICC-without erythema  Lab Results:  Lab Results  Component Value Date   WBC 11.4 (H) 10/15/2019   HGB 16.0 10/15/2019   HCT 46.7 10/15/2019   MCV 102.2 (H) 10/15/2019   PLT 180 10/15/2019   NEUTROABS 9.5 (H) 10/15/2019    CMP  Lab Results  Component Value Date   NA 135 10/01/2019   K 4.0 10/01/2019   CL 97 (L) 10/01/2019   CO2 31 10/01/2019   GLUCOSE 147 (H) 10/01/2019   BUN 12 10/01/2019   CREATININE 0.74 10/01/2019   CALCIUM 9.0 10/01/2019   PROT 7.0 10/01/2019   ALBUMIN 3.2 (L) 10/01/2019   AST 30 10/01/2019   ALT 29 10/01/2019   ALKPHOS 140 (H) 10/01/2019   BILITOT 0.3 10/01/2019   GFRNONAA >60 10/01/2019   GFRAA >60 10/01/2019    Lab Results  Component Value Date   CEA1 2,122.48 (H) 08/20/2019     Medications: I have reviewed the patient's current medications.   Assessment/Plan: 1. Pancreas mass, liver lesions  Abdominal ultrasound 09/03/2018-3 liver lesions. Intra-and extrahepatic biliary ductal dilatation.  Presentation to the emergency department 09/08/2018 with painless jaundice.  Initial bilirubin returned at 18.3.   MRI abdomen 09/08/2018-1.9 x 2.6 cm hypoenhancing lesion arising exophytically along the posterior aspect of the pancreatic head suspicious for pancreatic adenocarcinoma. There was suspected involvement of the distal common duct with secondary intrahepatic/extrahepatic ductal dilatation. Lesion noted to abut the IVC. Multiple liver metastases noted. Small upper abdominal/retroperitoneal lymph nodes noted.   ERCP 09/09/2018-high-grade stricture and obstruction of the distal common bile duct. A metallic biliary stent was placed. Brushings in the mid and lower third of the main bile duct were obtained with no malignant cells identified.   Bilirubin improved at 4.7 on 09/10/2018.   Biopsy of a liver lesion on 09/11/2018-metastatic carcinoma, cytokeratin 7 and TTF-1 positive, negative for cytokeratin 20, CDX 2, and PSA.  Foundation 1-microsatellite stable, tumor mutation burden 16, ERBB2 amplification  CT chest 09/22/2018-right hilar/mediastinal/retrocrural lymphadenopathy, ill-defined right lower lobe nodularity, emphysema  Cycle 1 carboplatin/Alimta/pembrolizumab 10/09/2018  Cycle 2 carboplatin/Alimta/pembrolizumab 10/30/2018  Cycle 3 carboplatin/Alimta/pembrolizumab 11/20/2018  Cycle 4 carboplatin/Alimta/pembrolizumab 12/11/2018  CT 12/26/2018-marked response to therapy of the radical abdominal adenopathy, decreased size of hepatic metastases, no new or progressive disease  Cycle 5 Alimta/pembrolizumab 01/01/2019  Cycle 6 Alimta/pembrolizumab 01/23/2019  Cycle 7 Alimta/pembrolizumab 02/12/2019  Cycle 8 Alimta/pembrolizumab 03/05/2019  Cycle 9 Alimta/pembrolizumab 03/26/2019  CTs 04/14/2019-slight interval decrease in size and conspicuity of multiple, irregular low-attenuation liver lesions. No new liver lesions. No recurrent mediastinal or hilar lymphadenopathy. No change in mildly prominent subcentimeter periaortic and peripheral lymph nodes  without evidence of recurrent abdominal or  pelvic lymphadenopathy.  Cycle 10 Alimta/pembrolizumab 04/16/2019  Cycle 11 pembrolizumab 05/07/2019 (Alimta held secondary to increased tearing and leg edema)  Cycle 12 Alimta/pembrolizumab 05/28/2019  Cycle 13 Alimta/pembrolizumab 06/18/2019  Restaging CTs 07/08/2019-worsening of low-density hepatic lesions. Scattered small lymph nodes with increasing conspicuity, largest along the left periaortic chain approximately 9 mm previously approximately 5 mm.  Cycle 14 Alimta/pembrolizumab 07/09/2019  Cycle 15 Alimta/pembrolizumab 07/31/2019  Cycle 16 Alimta/pembrolizumab 08/20/2019  Rising CEA beginning December 2020  CTs 09/08/2019-enlargement of liver metastases, mild enlargement of abdominal/retroperitoneal and lower thoracic nodes, nonspecific pulmonary nodules-stable  Cycle 1 gemcitabine 09/17/2019  Cycle 2 gemcitabine 10/01/2019  Cycle 3 gemcitabine 10/15/2019 2. Biliary obstruction status post stent placement 3. COPD 4. Hypertension 5. Trace edema right leg 12/11/2018. Doppler negative for DVT 12/12/2018. 6. Watery eyes, runny nose reported 12/11/2018. Question related to Alimta. Persistent watery eyes 04/16/2019. 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 Admission 12/24/2018 with a fever-no source for infection identified, completed outpatient course of ciprofloxacin 8. Port-A-Cath placement 09/14/2019, interventional radiology 9. Colonoscopy 06/16/2015-diverticulosis in the sigmoid colon, examination otherwise normal    Disposition: Ricky Conway appears stable.  He is tolerating the gemcitabine well.  I suspect the nipple tenderness is a benign finding.  He will complete cycle 3 gemcitabine today.  Ricky Conway will return for an office visit and chemotherapy in 2 weeks.  [name], MD  10/15/2019  9:04 AM

## 2019-10-15 NOTE — Patient Instructions (Signed)
Please provide a copy of your Medical Advanced Directive to have scanned into your record

## 2019-10-15 NOTE — Telephone Encounter (Signed)
Scheduled appt per 4/22 los - added additional cycle to whats already on schedule. Pt to get an updated schedule in treatment area.

## 2019-10-15 NOTE — Patient Instructions (Signed)

## 2019-10-23 NOTE — Progress Notes (Signed)
Pharmacist Chemotherapy Monitoring - Follow Up Assessment    I verify that I have reviewed each item in the below checklist:  . Regimen for the patient is scheduled for the appropriate day and plan matches scheduled date. Marland Kitchen Appropriate non-routine labs are ordered dependent on drug ordered. . If applicable, additional medications reviewed and ordered per protocol based on lifetime cumulative doses and/or treatment regimen.   Plan for follow-up and/or issues identified: No . I-vent associated with next due treatment: No . MD and/or nursing notified: No  Romualdo Bolk [facility] 10/23/2019 11:03 AM

## 2019-10-25 ENCOUNTER — Other Ambulatory Visit: Payer: Self-pay | Admitting: Oncology

## 2019-10-29 ENCOUNTER — Inpatient Hospital Stay: Payer: Medicare Other

## 2019-10-29 ENCOUNTER — Inpatient Hospital Stay: Payer: Medicare Other | Attending: Oncology | Admitting: Oncology

## 2019-10-29 ENCOUNTER — Other Ambulatory Visit: Payer: Self-pay

## 2019-10-29 VITALS — BP 137/76 | HR 69 | Temp 98.5°F | Resp 18 | Ht 70.0 in | Wt 175.9 lb

## 2019-10-29 DIAGNOSIS — Z95828 Presence of other vascular implants and grafts: Secondary | ICD-10-CM

## 2019-10-29 DIAGNOSIS — C3490 Malignant neoplasm of unspecified part of unspecified bronchus or lung: Secondary | ICD-10-CM | POA: Diagnosis not present

## 2019-10-29 DIAGNOSIS — C3431 Malignant neoplasm of lower lobe, right bronchus or lung: Secondary | ICD-10-CM | POA: Diagnosis present

## 2019-10-29 DIAGNOSIS — C787 Secondary malignant neoplasm of liver and intrahepatic bile duct: Secondary | ICD-10-CM | POA: Insufficient documentation

## 2019-10-29 DIAGNOSIS — Z5111 Encounter for antineoplastic chemotherapy: Secondary | ICD-10-CM | POA: Insufficient documentation

## 2019-10-29 LAB — CBC WITH DIFFERENTIAL (CANCER CENTER ONLY)
Abs Immature Granulocytes: 0.06 10*3/uL (ref 0.00–0.07)
Basophils Absolute: 0.1 10*3/uL (ref 0.0–0.1)
Basophils Relative: 1 %
Eosinophils Absolute: 0.2 10*3/uL (ref 0.0–0.5)
Eosinophils Relative: 2 %
HCT: 46.1 % (ref 39.0–52.0)
Hemoglobin: 15.8 g/dL (ref 13.0–17.0)
Immature Granulocytes: 1 %
Lymphocytes Relative: 9 %
Lymphs Abs: 1 10*3/uL (ref 0.7–4.0)
MCH: 34.6 pg — ABNORMAL HIGH (ref 26.0–34.0)
MCHC: 34.3 g/dL (ref 30.0–36.0)
MCV: 100.9 fL — ABNORMAL HIGH (ref 80.0–100.0)
Monocytes Absolute: 1.1 10*3/uL — ABNORMAL HIGH (ref 0.1–1.0)
Monocytes Relative: 10 %
Neutro Abs: 8.7 10*3/uL — ABNORMAL HIGH (ref 1.7–7.7)
Neutrophils Relative %: 77 %
Platelet Count: 309 10*3/uL (ref 150–400)
RBC: 4.57 MIL/uL (ref 4.22–5.81)
RDW: 15 % (ref 11.5–15.5)
WBC Count: 11.1 10*3/uL — ABNORMAL HIGH (ref 4.0–10.5)
nRBC: 0 % (ref 0.0–0.2)

## 2019-10-29 LAB — CMP (CANCER CENTER ONLY)
ALT: 34 U/L (ref 0–44)
AST: 35 U/L (ref 15–41)
Albumin: 3.3 g/dL — ABNORMAL LOW (ref 3.5–5.0)
Alkaline Phosphatase: 104 U/L (ref 38–126)
Anion gap: 8 (ref 5–15)
BUN: 9 mg/dL (ref 8–23)
CO2: 27 mmol/L (ref 22–32)
Calcium: 8.9 mg/dL (ref 8.9–10.3)
Chloride: 101 mmol/L (ref 98–111)
Creatinine: 0.67 mg/dL (ref 0.61–1.24)
GFR, Est AFR Am: >60 mL/min (ref >60)
GFR, Estimated: >60 mL/min (ref >60)
Glucose, Bld: 104 mg/dL — ABNORMAL HIGH (ref 70–99)
Potassium: 4 mmol/L (ref 3.5–5.1)
Sodium: 136 mmol/L (ref 135–145)
Total Bilirubin: 0.6 mg/dL (ref 0.3–1.2)
Total Protein: 6.9 g/dL (ref 6.5–8.1)

## 2019-10-29 LAB — CEA (IN HOUSE-CHCC): CEA (CHCC-In House): 2624.74 ng/mL — ABNORMAL HIGH (ref 0.00–5.00)

## 2019-10-29 MED ORDER — HEPARIN SOD (PORK) LOCK FLUSH 100 UNIT/ML IV SOLN
500.0000 [IU] | Freq: Once | INTRAVENOUS | Status: AC | PRN
  Administered 2019-10-29: 500 [IU]
  Filled 2019-10-29: qty 5

## 2019-10-29 MED ORDER — SODIUM CHLORIDE 0.9% FLUSH
10.0000 mL | INTRAVENOUS | Status: DC | PRN
  Administered 2019-10-29: 10 mL
  Filled 2019-10-29: qty 10

## 2019-10-29 MED ORDER — PROCHLORPERAZINE MALEATE 10 MG PO TABS
10.0000 mg | ORAL_TABLET | Freq: Once | ORAL | Status: AC
  Administered 2019-10-29: 10 mg via ORAL

## 2019-10-29 MED ORDER — PROCHLORPERAZINE MALEATE 10 MG PO TABS
ORAL_TABLET | ORAL | Status: AC
  Filled 2019-10-29: qty 1

## 2019-10-29 MED ORDER — SODIUM CHLORIDE 0.9 % IV SOLN
2000.0000 mg | Freq: Once | INTRAVENOUS | Status: AC
  Administered 2019-10-29: 2000 mg via INTRAVENOUS
  Filled 2019-10-29: qty 52.6

## 2019-10-29 MED ORDER — SODIUM CHLORIDE 0.9 % IV SOLN
Freq: Once | INTRAVENOUS | Status: AC
  Filled 2019-10-29: qty 250

## 2019-10-29 NOTE — Progress Notes (Signed)
[place] OFFICE PROGRESS NOTE   Diagnosis: Metastatic carcinoma  INTERVAL HISTORY:   Ricky Conway returns as scheduled.  He completed another treatment with gemcitabine on 10/15/2019.  He has noted a mild increase in dyspnea.  No other complaint.  Objective:  Vital signs in last 24 hours:  Blood pressure 137/76, pulse 69, temperature 98.5 F (36.9 C), temperature source Temporal, resp. rate 18, height _0  (1.778 m), weight 175 lb 14.4 oz (79.8 kg), SpO2 93 %.    Resp: Distant breath sounds, no respiratory distress Cardio: Regular rate and rhythm GI: No hepatosplenomegaly, mild tenderness in the right upper abdomen, no mass Vascular: No leg edema   Portacath/PICC-without erythema  Lab Results:  Lab Results  Component Value Date   WBC 11.1 (H) 10/29/2019   HGB 15.8 10/29/2019   HCT 46.1 10/29/2019   MCV 100.9 (H) 10/29/2019   PLT 309 10/29/2019   NEUTROABS 8.7 (H) 10/29/2019    CMP  Lab Results  Component Value Date   NA 135 10/15/2019   K 3.6 10/15/2019   CL 98 10/15/2019   CO2 29 10/15/2019   GLUCOSE 117 (H) 10/15/2019   BUN 8 10/15/2019   CREATININE 0.70 10/15/2019   CALCIUM 8.8 (L) 10/15/2019   PROT 6.9 10/15/2019   ALBUMIN 3.2 (L) 10/15/2019   AST 30 10/15/2019   ALT 33 10/15/2019   ALKPHOS 115 10/15/2019   BILITOT 0.6 10/15/2019   GFRNONAA >60 10/15/2019   GFRAA >60 10/15/2019    Lab Results  Component Value Date   CEA1 2,534.55 (H) 10/15/2019     Medications: I have reviewed the patient's current medications.   Assessment/Plan: 1. Pancreas mass, liver lesions  Abdominal ultrasound 09/03/2018-3 liver lesions. Intra-and extrahepatic biliary ductal dilatation.  Presentation to the emergency department 09/08/2018 with painless jaundice. Initial bilirubin returned at 18.3.   MRI abdomen 09/08/2018-1.9 x 2.6 cm hypoenhancing lesion arising exophytically along the posterior aspect of the pancreatic head suspicious for  pancreatic adenocarcinoma. There was suspected involvement of the distal common duct with secondary intrahepatic/extrahepatic ductal dilatation. Lesion noted to abut the IVC. Multiple liver metastases noted. Small upper abdominal/retroperitoneal lymph nodes noted.   ERCP 09/09/2018-high-grade stricture and obstruction of the distal common bile duct. A metallic biliary stent was placed. Brushings in the mid and lower third of the main bile duct were obtained with no malignant cells identified.   Bilirubin improved at 4.7 on 09/10/2018.   Biopsy of a liver lesion on 09/11/2018-metastatic carcinoma, cytokeratin 7 and TTF-1 positive, negative for cytokeratin 20, CDX 2, and PSA.  Foundation 1-microsatellite stable, tumor mutation burden 16, ERBB2 amplification  CT chest 09/22/2018-right hilar/mediastinal/retrocrural lymphadenopathy, ill-defined right lower lobe nodularity, emphysema  Cycle 1 carboplatin/Alimta/pembrolizumab 10/09/2018  Cycle 2 carboplatin/Alimta/pembrolizumab 10/30/2018  Cycle 3 carboplatin/Alimta/pembrolizumab 11/20/2018  Cycle 4 carboplatin/Alimta/pembrolizumab 12/11/2018  CT 12/26/2018-marked response to therapy of the radical abdominal adenopathy, decreased size of hepatic metastases, no new or progressive disease  Cycle 5 Alimta/pembrolizumab 01/01/2019  Cycle 6 Alimta/pembrolizumab 01/23/2019  Cycle 7 Alimta/pembrolizumab 02/12/2019  Cycle 8 Alimta/pembrolizumab 03/05/2019  Cycle 9 Alimta/pembrolizumab 03/26/2019  CTs 04/14/2019-slight interval decrease in size and conspicuity of multiple, irregular low-attenuation liver lesions. No new liver lesions. No recurrent mediastinal or hilar lymphadenopathy. No change in mildly prominent subcentimeter periaortic and peripheral lymph nodes without evidence of recurrent abdominal or pelvic lymphadenopathy.  Cycle 10 Alimta/pembrolizumab 04/16/2019  Cycle 11 pembrolizumab 05/07/2019 (Alimta held secondary to increased tearing  and leg edema)  Cycle 12 Alimta/pembrolizumab 05/28/2019  Cycle 13 Alimta/pembrolizumab 06/18/2019  Restaging CTs 07/08/2019-worsening of low-density hepatic lesions. Scattered small lymph nodes with increasing conspicuity, largest along the left periaortic chain approximately 9 mm previously approximately 5 mm.  Cycle 14 Alimta/pembrolizumab 07/09/2019  Cycle 15 Alimta/pembrolizumab 07/31/2019  Cycle 16 Alimta/pembrolizumab 08/20/2019  Rising CEA beginning December 2020  CTs 09/08/2019-enlargement of liver metastases, mild enlargement of abdominal/retroperitoneal and lower thoracic nodes, nonspecific pulmonary nodules-stable  Cycle 1 gemcitabine 09/17/2019  Cycle 2 gemcitabine 10/01/2019  Cycle 3 gemcitabine 10/15/2019  Cycle 4 gemcitabine 10/29/2019 2. Biliary obstruction status post stent placement 3. COPD 4. Hypertension 5. Trace edema right leg 12/11/2018. Doppler negative for DVT 12/12/2018. 6. Watery eyes, runny nose reported 12/11/2018. Question related to Alimta. Persistent watery eyes 04/16/2019. 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 Admission 12/24/2018 with a fever-no source for infection identified, completed outpatient course of ciprofloxacin 8. Port-A-Cath placement 09/14/2019, interventional radiology 9. Colonoscopy 06/16/2015-diverticulosis in the sigmoid colon, examination otherwise normal      Disposition: Ricky Conway appears unchanged.  He has completed 3 treatments with gemcitabine.  He is tolerating the gemcitabine well.  He will complete cycle 4 today.  The CEA was higher when he was here 2 weeks ago.  We will follow-up on the CEA from today.  If higher he will be referred for a restaging CT prior to an office visit in 2 weeks.  [name], MD  10/29/2019  9:24 AM

## 2019-10-29 NOTE — Patient Instructions (Signed)
[place] Discharge Instructions for Patients Receiving Chemotherapy  Today you received the following chemotherapy agent: Gemcitabine (gemzar)  To help prevent nausea and vomiting after your treatment, we encourage you to take your nausea medication as directed by your MD.   If you develop nausea and vomiting that is not controlled by your nausea medication, call the clinic.   BELOW ARE SYMPTOMS THAT SHOULD BE REPORTED IMMEDIATELY:  *FEVER GREATER THAN 100.5 F  *CHILLS WITH OR WITHOUT FEVER  NAUSEA AND VOMITING THAT IS NOT CONTROLLED WITH YOUR NAUSEA MEDICATION  *UNUSUAL SHORTNESS OF BREATH  *UNUSUAL BRUISING OR BLEEDING  TENDERNESS IN MOUTH AND THROAT WITH OR WITHOUT PRESENCE OF ULCERS  *URINARY PROBLEMS  *BOWEL PROBLEMS  UNUSUAL RASH Items with * indicate a potential emergency and should be followed up as soon as possible.  Feel free to call the clinic should you have any questions or concerns. The clinic phone number is [phone].  Please show the [place] at check-in to the Emergency Department and triage nurse.

## 2019-10-30 ENCOUNTER — Encounter: Payer: Self-pay | Admitting: *Deleted

## 2019-10-30 NOTE — Progress Notes (Signed)
Received request for documents from [facility] from dates of 07/07/18 to 07/08/19. Requested: Office notes, Diagnostic reports, and H & P. These were all faxed to [phone]

## 2019-11-02 ENCOUNTER — Telehealth: Payer: Self-pay | Admitting: *Deleted

## 2019-11-02 NOTE — Telephone Encounter (Signed)
-----   Message from Ladell Pier, MD sent at 10/30/2019  3:25 PM EDT ----- Please call patient, the CEA is slightly higher, will complete another cycle of gemcitabine prior to restaging CTs

## 2019-11-02 NOTE — Telephone Encounter (Signed)
Informed of increase in CEA and MD will have him complete one more cycle of gemzar before repeat scans. He understands and agrees.

## 2019-11-03 ENCOUNTER — Telehealth: Payer: Self-pay | Admitting: Oncology

## 2019-11-03 NOTE — Telephone Encounter (Signed)
Scheduled per los. Called and left msg. Mailed printout  °

## 2019-11-06 NOTE — Progress Notes (Signed)
Pharmacist Chemotherapy Monitoring - Follow Up Assessment    I verify that I have reviewed each item in the below checklist:  . Regimen for the patient is scheduled for the appropriate day and plan matches scheduled date. Marland Kitchen Appropriate non-routine labs are ordered dependent on drug ordered. . If applicable, additional medications reviewed and ordered per protocol based on lifetime cumulative doses and/or treatment regimen.   Plan for follow-up and/or issues identified: No . I-vent associated with next due treatment: Non .  . MD and/or nursing notified: No  Ricky Conway D 11/06/2019 2:59 PM

## 2019-11-08 ENCOUNTER — Other Ambulatory Visit: Payer: Self-pay | Admitting: Oncology

## 2019-11-12 ENCOUNTER — Ambulatory Visit: Payer: BC Managed Care – PPO | Admitting: Nurse Practitioner

## 2019-11-12 ENCOUNTER — Other Ambulatory Visit: Payer: BC Managed Care – PPO

## 2019-11-12 ENCOUNTER — Inpatient Hospital Stay: Payer: Medicare Other

## 2019-11-12 ENCOUNTER — Inpatient Hospital Stay (HOSPITAL_BASED_OUTPATIENT_CLINIC_OR_DEPARTMENT_OTHER): Payer: Medicare Other | Admitting: Oncology

## 2019-11-12 ENCOUNTER — Ambulatory Visit: Payer: BC Managed Care – PPO

## 2019-11-12 ENCOUNTER — Other Ambulatory Visit: Payer: Self-pay

## 2019-11-12 ENCOUNTER — Telehealth: Payer: Self-pay | Admitting: Oncology

## 2019-11-12 VITALS — BP 138/66 | HR 84 | Temp 97.7°F | Resp 18 | Ht 70.0 in | Wt 174.2 lb

## 2019-11-12 DIAGNOSIS — C3490 Malignant neoplasm of unspecified part of unspecified bronchus or lung: Secondary | ICD-10-CM | POA: Diagnosis not present

## 2019-11-12 DIAGNOSIS — Z95828 Presence of other vascular implants and grafts: Secondary | ICD-10-CM

## 2019-11-12 DIAGNOSIS — C787 Secondary malignant neoplasm of liver and intrahepatic bile duct: Secondary | ICD-10-CM | POA: Diagnosis not present

## 2019-11-12 LAB — CBC WITH DIFFERENTIAL (CANCER CENTER ONLY)
Abs Immature Granulocytes: 0.07 10*3/uL (ref 0.00–0.07)
Basophils Absolute: 0.1 10*3/uL (ref 0.0–0.1)
Basophils Relative: 1 %
Eosinophils Absolute: 0.2 10*3/uL (ref 0.0–0.5)
Eosinophils Relative: 2 %
HCT: 48.1 % (ref 39.0–52.0)
Hemoglobin: 16.4 g/dL (ref 13.0–17.0)
Immature Granulocytes: 1 %
Lymphocytes Relative: 9 %
Lymphs Abs: 1.1 10*3/uL (ref 0.7–4.0)
MCH: 34.2 pg — ABNORMAL HIGH (ref 26.0–34.0)
MCHC: 34.1 g/dL (ref 30.0–36.0)
MCV: 100.2 fL — ABNORMAL HIGH (ref 80.0–100.0)
Monocytes Absolute: 1.1 10*3/uL — ABNORMAL HIGH (ref 0.1–1.0)
Monocytes Relative: 9 %
Neutro Abs: 8.9 10*3/uL — ABNORMAL HIGH (ref 1.7–7.7)
Neutrophils Relative %: 78 %
Platelet Count: 198 10*3/uL (ref 150–400)
RBC: 4.8 MIL/uL (ref 4.22–5.81)
RDW: 15.3 % (ref 11.5–15.5)
WBC Count: 11.3 10*3/uL — ABNORMAL HIGH (ref 4.0–10.5)
nRBC: 0 % (ref 0.0–0.2)

## 2019-11-12 LAB — CMP (CANCER CENTER ONLY)
ALT: 47 U/L — ABNORMAL HIGH (ref 0–44)
AST: 44 U/L — ABNORMAL HIGH (ref 15–41)
Albumin: 3.4 g/dL — ABNORMAL LOW (ref 3.5–5.0)
Alkaline Phosphatase: 117 U/L (ref 38–126)
Anion gap: 9 (ref 5–15)
BUN: 8 mg/dL (ref 8–23)
CO2: 29 mmol/L (ref 22–32)
Calcium: 9 mg/dL (ref 8.9–10.3)
Chloride: 99 mmol/L (ref 98–111)
Creatinine: 0.72 mg/dL (ref 0.61–1.24)
GFR, Est AFR Am: >60 mL/min (ref >60)
GFR, Estimated: >60 mL/min (ref >60)
Glucose, Bld: 124 mg/dL — ABNORMAL HIGH (ref 70–99)
Potassium: 3.6 mmol/L (ref 3.5–5.1)
Sodium: 137 mmol/L (ref 135–145)
Total Bilirubin: 0.5 mg/dL (ref 0.3–1.2)
Total Protein: 7 g/dL (ref 6.5–8.1)

## 2019-11-12 LAB — CEA (IN HOUSE-CHCC): CEA (CHCC-In House): 3523.25 ng/mL — ABNORMAL HIGH (ref 0.00–5.00)

## 2019-11-12 MED ORDER — SODIUM CHLORIDE 0.9% FLUSH
10.0000 mL | INTRAVENOUS | Status: DC | PRN
  Administered 2019-11-12: 10 mL
  Filled 2019-11-12: qty 10

## 2019-11-12 MED ORDER — PROCHLORPERAZINE MALEATE 10 MG PO TABS
10.0000 mg | ORAL_TABLET | Freq: Once | ORAL | Status: AC
  Administered 2019-11-12: 10 mg via ORAL

## 2019-11-12 MED ORDER — SODIUM CHLORIDE 0.9 % IV SOLN
2000.0000 mg | Freq: Once | INTRAVENOUS | Status: AC
  Administered 2019-11-12: 2000 mg via INTRAVENOUS
  Filled 2019-11-12: qty 52.6

## 2019-11-12 MED ORDER — PROCHLORPERAZINE MALEATE 10 MG PO TABS
ORAL_TABLET | ORAL | Status: AC
  Filled 2019-11-12: qty 1

## 2019-11-12 MED ORDER — SODIUM CHLORIDE 0.9 % IV SOLN
Freq: Once | INTRAVENOUS | Status: AC
  Filled 2019-11-12: qty 250

## 2019-11-12 MED ORDER — HEPARIN SOD (PORK) LOCK FLUSH 100 UNIT/ML IV SOLN
500.0000 [IU] | Freq: Once | INTRAVENOUS | Status: AC | PRN
  Administered 2019-11-12: 500 [IU]
  Filled 2019-11-12: qty 5

## 2019-11-12 NOTE — Patient Instructions (Signed)
[place] Discharge Instructions for Patients Receiving Chemotherapy  Today you received the following chemotherapy agent: Gemcitabine (gemzar)  To help prevent nausea and vomiting after your treatment, we encourage you to take your nausea medication as directed by your MD.   If you develop nausea and vomiting that is not controlled by your nausea medication, call the clinic.   BELOW ARE SYMPTOMS THAT SHOULD BE REPORTED IMMEDIATELY:  *FEVER GREATER THAN 100.5 F  *CHILLS WITH OR WITHOUT FEVER  NAUSEA AND VOMITING THAT IS NOT CONTROLLED WITH YOUR NAUSEA MEDICATION  *UNUSUAL SHORTNESS OF BREATH  *UNUSUAL BRUISING OR BLEEDING  TENDERNESS IN MOUTH AND THROAT WITH OR WITHOUT PRESENCE OF ULCERS  *URINARY PROBLEMS  *BOWEL PROBLEMS  UNUSUAL RASH Items with * indicate a potential emergency and should be followed up as soon as possible.  Feel free to call the clinic should you have any questions or concerns. The clinic phone number is [phone].  Please show the [place] at check-in to the Emergency Department and triage nurse.

## 2019-11-12 NOTE — Progress Notes (Signed)
[place] OFFICE PROGRESS NOTE   Diagnosis: Non-small cell lung cancer   INTERVAL HISTORY:   Ricky Conway completed another treatment with gemcitabine on 10/29/2019.  No fever or rash.  He complains of erythema at the lower leg bilaterally for months.  He has intermittent toe swelling.  No other complaint.  Objective:  Vital signs in last 24 hours:  Blood pressure 138/66, pulse 84, temperature 97.7 F (36.5 C), temperature source Temporal, resp. rate 18, height 5' 10" (1.778 m), weight 174 lb 3.2 oz (79 kg), SpO2 90 %.   Lymphatics: No cervical, supraclavicular, or axillary nodes Resp: Lungs clear bilaterally Cardio: Regular rate and rhythm, 2/6 systolic murmur GI: Fullness in the right upper abdomen without a discrete liver edge Vascular: No leg edema, thickened firm skin at the lower leg and foot bilaterally  Skin: Confluent hyperpigmentation at the lower leg bilaterally  Portacath/PICC-without erythema  Lab Results:  Lab Results  Component Value Date   WBC 11.3 (H) 11/12/2019   HGB 16.4 11/12/2019   HCT 48.1 11/12/2019   MCV 100.2 (H) 11/12/2019   PLT 198 11/12/2019   NEUTROABS 8.9 (H) 11/12/2019    CMP  Lab Results  Component Value Date   NA 137 11/12/2019   K 3.6 11/12/2019   CL 99 11/12/2019   CO2 29 11/12/2019   GLUCOSE 124 (H) 11/12/2019   BUN 8 11/12/2019   CREATININE 0.72 11/12/2019   CALCIUM 9.0 11/12/2019   PROT 7.0 11/12/2019   ALBUMIN 3.4 (L) 11/12/2019   AST 44 (H) 11/12/2019   ALT 47 (H) 11/12/2019   ALKPHOS 117 11/12/2019   BILITOT 0.5 11/12/2019   GFRNONAA >60 11/12/2019   GFRAA >60 11/12/2019    Lab Results  Component Value Date   CEA1 2,624.74 (H) 10/29/2019     Medications: I have reviewed the patient's current medications.   Assessment/Plan: 1. Pancreas mass, liver lesions  Abdominal ultrasound 09/03/2018-3 liver lesions. Intra-and extrahepatic biliary ductal dilatation.  Presentation to the emergency  department 09/08/2018 with painless jaundice. Initial bilirubin returned at 18.3.   MRI abdomen 09/08/2018-1.9 x 2.6 cm hypoenhancing lesion arising exophytically along the posterior aspect of the pancreatic head suspicious for pancreatic adenocarcinoma. There was suspected involvement of the distal common duct with secondary intrahepatic/extrahepatic ductal dilatation. Lesion noted to abut the IVC. Multiple liver metastases noted. Small upper abdominal/retroperitoneal lymph nodes noted.   ERCP 09/09/2018-high-grade stricture and obstruction of the distal common bile duct. A metallic biliary stent was placed. Brushings in the mid and lower third of the main bile duct were obtained with no malignant cells identified.   Bilirubin improved at 4.7 on 09/10/2018.   Biopsy of a liver lesion on 09/11/2018-metastatic carcinoma, cytokeratin 7 and TTF-1 positive, negative for cytokeratin 20, CDX 2, and PSA.  Foundation 1-microsatellite stable, tumor mutation burden 16, ERBB2 amplification  CT chest 09/22/2018-right hilar/mediastinal/retrocrural lymphadenopathy, ill-defined right lower lobe nodularity, emphysema  Cycle 1 carboplatin/Alimta/pembrolizumab 10/09/2018  Cycle 2 carboplatin/Alimta/pembrolizumab 10/30/2018  Cycle 3 carboplatin/Alimta/pembrolizumab 11/20/2018  Cycle 4 carboplatin/Alimta/pembrolizumab 12/11/2018  CT 12/26/2018-marked response to therapy of the radical abdominal adenopathy, decreased size of hepatic metastases, no new or progressive disease  Cycle 5 Alimta/pembrolizumab 01/01/2019  Cycle 6 Alimta/pembrolizumab 01/23/2019  Cycle 7 Alimta/pembrolizumab 02/12/2019  Cycle 8 Alimta/pembrolizumab 03/05/2019  Cycle 9 Alimta/pembrolizumab 03/26/2019  CTs 04/14/2019-slight interval decrease in size and conspicuity of multiple, irregular low-attenuation liver lesions. No new liver lesions. No recurrent mediastinal or hilar lymphadenopathy. No change in mildly prominent subcentimeter  periaortic  and peripheral lymph nodes without evidence of recurrent abdominal or pelvic lymphadenopathy.  Cycle 10 Alimta/pembrolizumab 04/16/2019  Cycle 11 pembrolizumab 05/07/2019 (Alimta held secondary to increased tearing and leg edema)  Cycle 12 Alimta/pembrolizumab 05/28/2019  Cycle 13 Alimta/pembrolizumab 06/18/2019  Restaging CTs 07/08/2019-worsening of low-density hepatic lesions. Scattered small lymph nodes with increasing conspicuity, largest along the left periaortic chain approximately 9 mm previously approximately 5 mm.  Cycle 14 Alimta/pembrolizumab 07/09/2019  Cycle 15 Alimta/pembrolizumab 07/31/2019  Cycle 16 Alimta/pembrolizumab 08/20/2019  Rising CEA beginning December 2020  CTs 09/08/2019-enlargement of liver metastases, mild enlargement of abdominal/retroperitoneal and lower thoracic nodes, nonspecific pulmonary nodules-stable  Cycle 1 gemcitabine 09/17/2019  Cycle 2 gemcitabine 10/01/2019  Cycle 3 gemcitabine 10/15/2019  Cycle 4 gemcitabine 10/29/2019  Cycle 5 gemcitabine 11/12/2019 2. Biliary obstruction status post stent placement 3. COPD 4. Hypertension 5. Trace edema right leg 12/11/2018. Doppler negative for DVT 12/12/2018. 6. Watery eyes, runny nose reported 12/11/2018. Question related to Alimta. Persistent watery eyes 04/16/2019. 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 Admission 12/24/2018 with a fever-no source for infection identified, completed outpatient course of ciprofloxacin 8. Port-A-Cath placement 09/14/2019, interventional radiology 9. Colonoscopy 06/16/2015-diverticulosis in the sigmoid colon, examination otherwise normal   Disposition: Ricky Conway appears unchanged.  He will complete cycle 5 gemcitabine today.  The CEA was slightly higher when he is here 2 weeks ago.  He will be scheduled for restaging CTs next week.  He will return for an office visit in 2 weeks.  We will decide on  continuing gemcitabine versus switching to a different systemic therapy regimen based on the restaging CT result.  [name], MD  11/12/2019  9:46 AM

## 2019-11-12 NOTE — Telephone Encounter (Signed)
Per 5/20 los Ct 5/27 or 5/28 F/u 6/3 as scheduled

## 2019-11-19 ENCOUNTER — Ambulatory Visit (HOSPITAL_COMMUNITY)
Admission: RE | Admit: 2019-11-19 | Discharge: 2019-11-19 | Disposition: A | Discharge status: Home / Self Care | Payer: Medicare Other | Source: Ambulatory Visit | Attending: Oncology | Admitting: Oncology

## 2019-11-19 DIAGNOSIS — C3490 Malignant neoplasm of unspecified part of unspecified bronchus or lung: Secondary | ICD-10-CM | POA: Insufficient documentation

## 2019-11-19 MED ORDER — IOHEXOL 300 MG/ML  SOLN
100.0000 mL | Freq: Once | INTRAMUSCULAR | Status: AC | PRN
  Administered 2019-11-19: 100 mL via INTRAVENOUS

## 2019-11-19 MED ORDER — SODIUM CHLORIDE (PF) 0.9 % IJ SOLN
INTRAMUSCULAR | Status: AC
  Filled 2019-11-19: qty 50

## 2019-11-19 NOTE — Progress Notes (Signed)
Pharmacist Chemotherapy Monitoring - Follow Up Assessment    I verify that I have reviewed each item in the below checklist:  . Regimen for the patient is scheduled for the appropriate day and plan matches scheduled date. Marland Kitchen Appropriate non-routine labs are ordered dependent on drug ordered. . If applicable, additional medications reviewed and ordered per protocol based on lifetime cumulative doses and/or treatment regimen.   Plan for follow-up and/or issues identified: Yes . I-vent associated with next due treatment: Yes . MD and/or nursing notified: No  Britt Boozer 11/19/2019 8:50 AM

## 2019-11-25 ENCOUNTER — Other Ambulatory Visit: Payer: Self-pay | Admitting: Oncology

## 2019-11-25 NOTE — Progress Notes (Signed)
Spoke w/ Dr. Benay Spice - treatment being intensified for progression. Carboplatin AUC=4 to be added to gemcitabine. Treatment for both drugs will be D1Q21 days. Antiemetics adjusted accordingly and antihistamines added for history of carboplatin use (hx of four doses, added back a bit early to reduce risk of infusion reaction with re-exposure).   Demetrius Charity, PharmD, BCPS, [facility] Pharmacist Pharmacy Phone: [phone] 11/25/2019

## 2019-11-26 ENCOUNTER — Encounter: Payer: Self-pay | Admitting: Nurse Practitioner

## 2019-11-26 ENCOUNTER — Inpatient Hospital Stay: Payer: Medicare Other

## 2019-11-26 ENCOUNTER — Other Ambulatory Visit: Payer: Self-pay

## 2019-11-26 ENCOUNTER — Inpatient Hospital Stay: Payer: Medicare Other | Attending: Oncology | Admitting: Nurse Practitioner

## 2019-11-26 VITALS — BP 131/72 | HR 86 | Temp 98.6°F | Resp 18 | Ht 70.0 in | Wt 174.7 lb

## 2019-11-26 DIAGNOSIS — C3490 Malignant neoplasm of unspecified part of unspecified bronchus or lung: Secondary | ICD-10-CM | POA: Diagnosis not present

## 2019-11-26 DIAGNOSIS — I10 Essential (primary) hypertension: Secondary | ICD-10-CM | POA: Diagnosis not present

## 2019-11-26 DIAGNOSIS — H3530 Unspecified macular degeneration: Secondary | ICD-10-CM | POA: Insufficient documentation

## 2019-11-26 DIAGNOSIS — J449 Chronic obstructive pulmonary disease, unspecified: Secondary | ICD-10-CM | POA: Diagnosis not present

## 2019-11-26 DIAGNOSIS — R7401 Elevation of levels of liver transaminase levels: Secondary | ICD-10-CM | POA: Diagnosis not present

## 2019-11-26 DIAGNOSIS — C3431 Malignant neoplasm of lower lobe, right bronchus or lung: Secondary | ICD-10-CM | POA: Insufficient documentation

## 2019-11-26 DIAGNOSIS — R630 Anorexia: Secondary | ICD-10-CM | POA: Insufficient documentation

## 2019-11-26 DIAGNOSIS — R531 Weakness: Secondary | ICD-10-CM | POA: Insufficient documentation

## 2019-11-26 DIAGNOSIS — R970 Elevated carcinoembryonic antigen [CEA]: Secondary | ICD-10-CM | POA: Insufficient documentation

## 2019-11-26 DIAGNOSIS — R5383 Other fatigue: Secondary | ICD-10-CM | POA: Diagnosis present

## 2019-11-26 DIAGNOSIS — Z95828 Presence of other vascular implants and grafts: Secondary | ICD-10-CM

## 2019-11-26 DIAGNOSIS — K831 Obstruction of bile duct: Secondary | ICD-10-CM | POA: Insufficient documentation

## 2019-11-26 DIAGNOSIS — C787 Secondary malignant neoplasm of liver and intrahepatic bile duct: Secondary | ICD-10-CM | POA: Insufficient documentation

## 2019-11-26 LAB — CBC WITH DIFFERENTIAL (CANCER CENTER ONLY)
Abs Immature Granulocytes: 0.19 10*3/uL — ABNORMAL HIGH (ref 0.00–0.07)
Basophils Absolute: 0 10*3/uL (ref 0.0–0.1)
Basophils Relative: 0 %
Eosinophils Absolute: 0.1 10*3/uL (ref 0.0–0.5)
Eosinophils Relative: 1 %
HCT: 47 % (ref 39.0–52.0)
Hemoglobin: 16 g/dL (ref 13.0–17.0)
Immature Granulocytes: 2 %
Lymphocytes Relative: 8 %
Lymphs Abs: 0.9 10*3/uL (ref 0.7–4.0)
MCH: 34.4 pg — ABNORMAL HIGH (ref 26.0–34.0)
MCHC: 34 g/dL (ref 30.0–36.0)
MCV: 101.1 fL — ABNORMAL HIGH (ref 80.0–100.0)
Monocytes Absolute: 1.3 10*3/uL — ABNORMAL HIGH (ref 0.1–1.0)
Monocytes Relative: 12 %
Neutro Abs: 8.5 10*3/uL — ABNORMAL HIGH (ref 1.7–7.7)
Neutrophils Relative %: 77 %
Platelet Count: 265 10*3/uL (ref 150–400)
RBC: 4.65 MIL/uL (ref 4.22–5.81)
RDW: 15.7 % — ABNORMAL HIGH (ref 11.5–15.5)
WBC Count: 11 10*3/uL — ABNORMAL HIGH (ref 4.0–10.5)
nRBC: 0 % (ref 0.0–0.2)

## 2019-11-26 LAB — CMP (CANCER CENTER ONLY)
ALT: 191 U/L — ABNORMAL HIGH (ref 0–44)
AST: 239 U/L (ref 15–41)
Albumin: 3.2 g/dL — ABNORMAL LOW (ref 3.5–5.0)
Alkaline Phosphatase: 225 U/L — ABNORMAL HIGH (ref 38–126)
Anion gap: 11 (ref 5–15)
BUN: 8 mg/dL (ref 8–23)
CO2: 27 mmol/L (ref 22–32)
Calcium: 9.1 mg/dL (ref 8.9–10.3)
Chloride: 97 mmol/L — ABNORMAL LOW (ref 98–111)
Creatinine: 0.73 mg/dL (ref 0.61–1.24)
GFR, Est AFR Am: >60 mL/min (ref >60)
GFR, Estimated: >60 mL/min (ref >60)
Glucose, Bld: 119 mg/dL — ABNORMAL HIGH (ref 70–99)
Potassium: 3.8 mmol/L (ref 3.5–5.1)
Sodium: 135 mmol/L (ref 135–145)
Total Bilirubin: 0.7 mg/dL (ref 0.3–1.2)
Total Protein: 6.9 g/dL (ref 6.5–8.1)

## 2019-11-26 LAB — CEA (IN HOUSE-CHCC): CEA (CHCC-In House): 2342.73 ng/mL — ABNORMAL HIGH (ref 0.00–5.00)

## 2019-11-26 MED ORDER — HEPARIN SOD (PORK) LOCK FLUSH 100 UNIT/ML IV SOLN
500.0000 [IU] | Freq: Once | INTRAVENOUS | Status: AC | PRN
  Administered 2019-11-26: 500 [IU]
  Filled 2019-11-26: qty 5

## 2019-11-26 MED ORDER — SODIUM CHLORIDE 0.9 % IV SOLN
Freq: Once | INTRAVENOUS | Status: AC
  Filled 2019-11-26: qty 250

## 2019-11-26 MED ORDER — SODIUM CHLORIDE 0.9% FLUSH
10.0000 mL | INTRAVENOUS | Status: DC | PRN
  Filled 2019-11-26: qty 10

## 2019-11-26 MED ORDER — SODIUM CHLORIDE 0.9% FLUSH
10.0000 mL | INTRAVENOUS | Status: DC | PRN
  Administered 2019-11-26: 10 mL
  Filled 2019-11-26: qty 10

## 2019-11-26 NOTE — Progress Notes (Signed)
[place]   [phone] [phone]   Clinic Follow up Note   Patient Care Team: Mayra Neer, MD as PCP - General (Family Medicine) Arna Snipe, RN (Inactive) as Registered Nurse Ladell Pier, MD as Consulting Physician (Oncology) 11/26/2019  CHIEF COMPLAINT: Follow up non-small cell lung cancer   INTERVAL HISTORY: Ricky Conway returns for follow up and treatment as scheduled. He completed another cycle of Gemcitabine on 11/12/19. He had a restaging scan and spoke to Dr. Benay Spice via phone about results. He is a little depressed but not bad, gets this way sometimes. He feels "not too bad" on chemo. Appetite is good. He rests often but remains functional at home, fatigue stable on treatment. Manages chronic constipation with stool softener daily and laxative q2 weeks. Uses oxygen with his mask. Leg swelling is improving.   Otherwise, denies n/v/d, bleeding, fever, chills, rash, worsening cough, chest pain, dyspnea, calf pain, or other pain.    MEDICAL HISTORY:  Past Medical History:  Diagnosis Date  . 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 COPD (chronic obstructive pulmonary disease) ([place])   . Dyspnea   . Hyperlipidemia   . Hypertension   . lung ca dx'd 08/2018    SURGICAL HISTORY: Past Surgical History:  Procedure Laterality Date  . BILIARY BRUSHING  09/09/2018   Procedure: BILIARY BRUSHING;  Surgeon: Ronnette Juniper, MD;  Location: [facility] ENDOSCOPY;  Service: Gastroenterology;;  . BILIARY STENT PLACEMENT  09/09/2018   Procedure: BILIARY STENT PLACEMENT;  Surgeon: Ronnette Juniper, MD;  Location: [place];  Service: Gastroenterology;;  . ERCP N/A 09/09/2018   Procedure: ENDOSCOPIC RETROGRADE CHOLANGIOPANCREATOGRAPHY (ERCP);  Surgeon: Ronnette Juniper, MD;  Location: [place];  Service: Gastroenterology;  Laterality: N/A;  . FOOT SURGERY    . HERNIA REPAIR     WITH MESH  . IR IMAGING GUIDED PORT INSERTION  09/14/2019  . REMOVAL OF STONES   09/09/2018   Procedure: REMOVAL OF STONES;  Surgeon: Ronnette Juniper, MD;  Location: [facility] ENDOSCOPY;  Service: Gastroenterology;;  . Joan Mayans  09/09/2018   Procedure: SPHINCTEROTOMY;  Surgeon: Ronnette Juniper, MD;  Location: [facility] ENDOSCOPY;  Service: Gastroenterology;;    I have reviewed the social history and family history with the patient and they are unchanged from previous note.  ALLERGIES:  has No Known Allergies.  MEDICATIONS:  Current Outpatient Medications  Medication Sig Dispense Refill  . aspirin EC 81 MG tablet Take 81 mg by mouth daily.    . bisacodyl (DULCOLAX) 5 MG EC tablet Take 5 mg by mouth daily as needed for moderate constipation.    . furosemide (LASIX) 20 MG tablet Take 20 mg by mouth daily.    Marland Kitchen levothyroxine (SYNTHROID) 50 MCG tablet TAKE 1 TABLET BY MOUTH DAILY BEFORE BREAKFAST 90 tablet 1  . lisinopril (PRINIVIL,ZESTRIL) 40 MG tablet Take 40 mg by mouth daily.    . Multiple Vitamins-Minerals (PRESERVISION AREDS PO) Take 1 tablet by mouth 2 (two) times a day.    . prochlorperazine (COMPAZINE) 5 MG tablet Take 1 tablet (5 mg total) by mouth every 6 (six) hours as needed for nausea or vomiting. 30 tablet 3  . Saw Palmetto, Serenoa repens, (SAW PALMETTO PO) Take 1 capsule by mouth daily.     . simvastatin (ZOCOR) 40 MG tablet Take 20 mg by mouth daily at 6 PM.     . lidocaine-prilocaine (EMLA) cream Apply 1 application topically as needed. (Patient not taking: Reported on 10/15/2019) 30 g 1   Current  Facility-Administered Medications  Medication Dose Route Frequency Provider Last Rate Last Admin  . 0.9 %  sodium chloride infusion   Intravenous Once Alla Feeling, NP 500 mL/hr at 11/26/19 1123 New Bag at 11/26/19 1123   Facility-Administered Medications Ordered in Other Visits  Medication Dose Route Frequency Provider Last Rate Last Admin  . sodium chloride flush (NS) 0.9 % injection 10 mL  10 mL Intravenous PRN Ladell Pier, MD      . sodium chloride flush (NS) 0.9 %  injection 10 mL  10 mL Intravenous PRN Ladell Pier, MD        PHYSICAL EXAMINATION:  Vitals:   11/26/19 1010  BP: 131/72  Pulse: 86  Resp: 18  Temp: 98.6 F (37 C)  SpO2: 94%   Filed Weights   11/26/19 1010  Weight: 174 lb 11.2 oz (79.2 kg)    GENERAL:alert, no distress and comfortable SKIN: no rash to exposed skin  EYES: sclera anicteric  LUNGS: clear with normal breathing effort HEART: regular rate & rhythm, murmur noted. Lower leg/feet erythema with dry skin and firmness equal bilaterally  ABDOMEN: abdomen soft, non-tender and normal bowel sounds. RUQ fullness without discrete mass  Musculoskeletal:no cyanosis of digits and no clubbing  NEURO: alert & oriented x 3 with fluent speech, normal gait PAC without erythema   LABORATORY DATA:  I have reviewed the data as listed CBC Latest Ref Rng & Units 11/26/2019 11/12/2019 10/29/2019  WBC 4.0 - 10.5 K/uL 11.0(H) 11.3(H) 11.1(H)  Hemoglobin 13.0 - 17.0 g/dL 16.0 16.4 15.8  Hematocrit 39.0 - 52.0 % 47.0 48.1 46.1  Platelets 150 - 400 K/uL 265 198 309     CMP Latest Ref Rng & Units 11/26/2019 11/12/2019 10/29/2019  Glucose 70 - 99 mg/dL 119(H) 124(H) 104(H)  BUN 8 - 23 mg/dL '8 8 9  ' Creatinine 0.61 - 1.24 mg/dL 0.73 0.72 0.67  Sodium 135 - 145 mmol/L 135 137 136  Potassium 3.5 - 5.1 mmol/L 3.8 3.6 4.0  Chloride 98 - 111 mmol/L 97(L) 99 101  CO2 22 - 32 mmol/L '27 29 27  ' Calcium 8.9 - 10.3 mg/dL 9.1 9.0 8.9  Total Protein 6.5 - 8.1 g/dL 6.9 7.0 6.9  Total Bilirubin 0.3 - 1.2 mg/dL 0.7 0.5 0.6  Alkaline Phos 38 - 126 U/L 225(H) 117 104  AST 15 - 41 U/L 239(HH) 44(H) 35  ALT 0 - 44 U/L 191(H) 47(H) 34      RADIOGRAPHIC STUDIES: I have personally reviewed the radiological images as listed and agreed with the findings in the report. No results found.   ASSESSMENT & PLAN:   1. Pancreas mass, liver lesions  Abdominal ultrasound 09/03/2018-3 liver lesions. Intra-and extrahepatic biliary ductal dilatation.  Presentation  to the emergency department 09/08/2018 with painless jaundice. Initial bilirubin returned at 18.3.   MRI abdomen 09/08/2018-1.9 x 2.6 cm hypoenhancing lesion arising exophytically along the posterior aspect of the pancreatic head suspicious for pancreatic adenocarcinoma. There was suspected involvement of the distal common duct with secondary intrahepatic/extrahepatic ductal dilatation. Lesion noted to abut the IVC. Multiple liver metastases noted. Small upper abdominal/retroperitoneal lymph nodes noted.   ERCP 09/09/2018-high-grade stricture and obstruction of the distal common bile duct. A metallic biliary stent was placed. Brushings in the mid and lower third of the main bile duct were obtained with no malignant cells identified.   Bilirubin improved at 4.7 on 09/10/2018.   Biopsy of a liver lesion on 09/11/2018-metastatic carcinoma, cytokeratin 7 and TTF-1 positive,  negative for cytokeratin 20, CDX 2, and PSA.  Foundation 1-microsatellite stable, tumor mutation burden 16, ERBB2 amplification  CT chest 09/22/2018-right hilar/mediastinal/retrocrural lymphadenopathy, ill-defined right lower lobe nodularity, emphysema  Cycle 1 carboplatin/Alimta/pembrolizumab 10/09/2018  Cycle 2 carboplatin/Alimta/pembrolizumab 10/30/2018  Cycle 3 carboplatin/Alimta/pembrolizumab 11/20/2018  Cycle 4 carboplatin/Alimta/pembrolizumab 12/11/2018  CT 12/26/2018-marked response to therapy of the radical abdominal adenopathy, decreased size of hepatic metastases, no new or progressive disease  Cycle 5 Alimta/pembrolizumab 01/01/2019  Cycle 6 Alimta/pembrolizumab 01/23/2019  Cycle 7 Alimta/pembrolizumab 02/12/2019  Cycle 8 Alimta/pembrolizumab 03/05/2019  Cycle 9 Alimta/pembrolizumab 03/26/2019  CTs 04/14/2019-slight interval decrease in size and conspicuity of multiple, irregular low-attenuation liver lesions. No new liver lesions. No recurrent mediastinal or hilar lymphadenopathy. No change in mildly  prominent subcentimeter periaortic and peripheral lymph nodes without evidence of recurrent abdominal or pelvic lymphadenopathy.  Cycle 10 Alimta/pembrolizumab 04/16/2019  Cycle 11 pembrolizumab 05/07/2019 (Alimta held secondary to increased tearing and leg edema)  Cycle 12 Alimta/pembrolizumab 05/28/2019  Cycle 13 Alimta/pembrolizumab 06/18/2019  Restaging CTs 07/08/2019-worsening of low-density hepatic lesions. Scattered small lymph nodes with increasing conspicuity, largest along the left periaortic chain approximately 9 mm previously approximately 5 mm.  Cycle 14 Alimta/pembrolizumab 07/09/2019  Cycle 15 Alimta/pembrolizumab 07/31/2019  Cycle 16 Alimta/pembrolizumab 08/20/2019  Rising CEA beginning December 2020  CTs 09/08/2019-enlargement of liver metastases, mild enlargement of abdominal/retroperitoneal and lower thoracic nodes, nonspecific pulmonary nodules-stable  Cycle 1 gemcitabine 09/17/2019  Cycle 2 gemcitabine 10/01/2019  Cycle 3 gemcitabine 10/15/2019  Cycle 4 gemcitabine 10/29/2019  Cycle 5 gemcitabine 11/12/2019  Rising CEA 10/2019  CTs 11/19/19 mixed response in liver, stable pulmonary nodules, thoracic and RP nodes, overall stable  Plan to intensify chemo carboplatin AUC 4 plus gemcitabine given on day 1 q21 days starting ~12/03/19   2. Biliary obstruction status post stent placement 3. COPD 4. Hypertension 5. Trace edema right leg 12/11/2018. Doppler negative for DVT 12/12/2018. 6. Watery eyes, runny nose reported 12/11/2018. Question related to Alimta. Persistent watery eyes 04/16/2019. 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 Admission 12/24/2018 with a fever-no source for infection identified, completed outpatient course of ciprofloxacin 8. Port-A-Cath placement 09/14/2019, interventional radiology 9. Colonoscopy 06/16/2015-diverticulosis in the sigmoid colon, examination otherwise normal 10. Transaminitis -  11/26/2019  Disposition:  Ricky Conway appears stable. He completed 5 cycles of single agent gemcitabine which he tolerates well with stable fatigue and chronic constipation. His CEA increased recently, he subsequently underwent restaging CT CAP which was reviewed by Dr. Benay Spice with the patient over the phone on 11/25/19. Scan shows mixed response in the liver, stable lung nodules and thoracic and RP adenopathy, overall stable. Dr. Benay Spice favored to intensify his chemo by adding carboplatin plus gemcitabine given on day 1 q21 days. I discussed potential side effects and symptom management. I explained the goal of treatment is palliative.   Labs reviewed, CBC is stable. LFTs have markedly increased, AST 239, ALT 191, ALK PHOS 225. Tbili remains normal. Per CT, no obvious ductal dilatation or obstruction, stent in place and patent. He is on a statin which I recommend to hold for now. Denies significant alcohol or tylenol use. He will avoid hepatotoxic agents.   Unclear if the transaminitis is related to chemo toxicity from gemcitabine vs hepatic metastasis vs stent. I recommend to hold chemo today, he will get IV hydration. He was encouraged to drink more at home. He will return for repeat CMP and office visit next week before proceeding with chemo. We will f/u on the CEA from today.  Orders Placed This Encounter  Procedures  . CMP ([place] only)    Standing Status:   Future    Standing Expiration Date:   11/25/2020   All questions were answered. The patient knows to call the clinic with any problems, questions or concerns. No barriers to learning was detected. Total encounter time was 30 minutes.      Alla Feeling, NP 11/26/19

## 2019-11-26 NOTE — Progress Notes (Signed)
CRITICAL VALUE STICKER  CRITICAL VALUE: AST 239   RECEIVER (on-site recipient of call): Beth, RN to [place], [place] NOTIFIED: 11/26/19 at 1042  MESSENGER (representative from lab):  MD NOTIFIED: Marijean Niemann, NP  TIME OF NOTIFICATION: 1043  RESPONSE:

## 2019-11-26 NOTE — Patient Instructions (Signed)

## 2019-11-27 ENCOUNTER — Telehealth: Payer: Self-pay | Admitting: *Deleted

## 2019-11-27 ENCOUNTER — Telehealth: Payer: Self-pay | Admitting: Oncology

## 2019-11-27 ENCOUNTER — Telehealth: Payer: Self-pay | Admitting: Nurse Practitioner

## 2019-11-27 NOTE — Telephone Encounter (Signed)
error 

## 2019-11-27 NOTE — Telephone Encounter (Signed)
Scheduled appt per 6/4 sch message =-pt wife is aware

## 2019-11-27 NOTE — Telephone Encounter (Signed)
Scheduled appt per 6/3 los.  Spoke with pt and they are aware of the appt date and time.

## 2019-11-27 NOTE — Telephone Encounter (Signed)
Called wife and informed her that Dr. Benay Spice wants to repeat his labs on Monday instead ow waiting until 6/10. She is agreeable. Informed her a scheduler will be calling time. Scheduling message sent.

## 2019-11-30 ENCOUNTER — Inpatient Hospital Stay (HOSPITAL_COMMUNITY)
Admission: AD | Admit: 2019-11-30 | Discharge: 2019-12-03 | DRG: 920 | Disposition: A | Discharge status: Home / Self Care | Payer: Medicare Other | Source: Ambulatory Visit | Attending: Internal Medicine | Admitting: Internal Medicine

## 2019-11-30 ENCOUNTER — Inpatient Hospital Stay: Payer: Medicare Other

## 2019-11-30 ENCOUNTER — Encounter (HOSPITAL_COMMUNITY): Payer: Self-pay | Admitting: Internal Medicine

## 2019-11-30 ENCOUNTER — Inpatient Hospital Stay (HOSPITAL_COMMUNITY): Payer: Medicare Other

## 2019-11-30 ENCOUNTER — Other Ambulatory Visit: Payer: Self-pay | Admitting: Medical

## 2019-11-30 ENCOUNTER — Other Ambulatory Visit: Payer: Self-pay

## 2019-11-30 ENCOUNTER — Inpatient Hospital Stay (HOSPITAL_BASED_OUTPATIENT_CLINIC_OR_DEPARTMENT_OTHER): Payer: Medicare Other | Admitting: Medical

## 2019-11-30 ENCOUNTER — Ambulatory Visit (HOSPITAL_COMMUNITY)
Admission: RE | Admit: 2019-11-30 | Discharge: 2019-11-30 | Disposition: A | Discharge status: Home / Self Care | Payer: Medicare Other | Source: Ambulatory Visit | Attending: Medical | Admitting: Medical

## 2019-11-30 VITALS — BP 168/97 | HR 84 | Temp 98.4°F | Resp 18 | Wt 170.2 lb

## 2019-11-30 DIAGNOSIS — Z79899 Other long term (current) drug therapy: Secondary | ICD-10-CM

## 2019-11-30 DIAGNOSIS — K5900 Constipation, unspecified: Secondary | ICD-10-CM | POA: Diagnosis present

## 2019-11-30 DIAGNOSIS — E785 Hyperlipidemia, unspecified: Secondary | ICD-10-CM | POA: Diagnosis present

## 2019-11-30 DIAGNOSIS — Z20822 Contact with and (suspected) exposure to covid-19: Secondary | ICD-10-CM | POA: Diagnosis present

## 2019-11-30 DIAGNOSIS — C787 Secondary malignant neoplasm of liver and intrahepatic bile duct: Secondary | ICD-10-CM | POA: Diagnosis present

## 2019-11-30 DIAGNOSIS — E440 Moderate protein-calorie malnutrition: Secondary | ICD-10-CM | POA: Diagnosis present

## 2019-11-30 DIAGNOSIS — R17 Unspecified jaundice: Secondary | ICD-10-CM | POA: Diagnosis present

## 2019-11-30 DIAGNOSIS — K08109 Complete loss of teeth, unspecified cause, unspecified class: Secondary | ICD-10-CM | POA: Diagnosis present

## 2019-11-30 DIAGNOSIS — T85590A Other mechanical complication of bile duct prosthesis, initial encounter: Secondary | ICD-10-CM | POA: Diagnosis present

## 2019-11-30 DIAGNOSIS — C3490 Malignant neoplasm of unspecified part of unspecified bronchus or lung: Secondary | ICD-10-CM

## 2019-11-30 DIAGNOSIS — K8051 Calculus of bile duct without cholangitis or cholecystitis with obstruction: Secondary | ICD-10-CM | POA: Diagnosis present

## 2019-11-30 DIAGNOSIS — E871 Hypo-osmolality and hyponatremia: Secondary | ICD-10-CM | POA: Diagnosis present

## 2019-11-30 DIAGNOSIS — Z79890 Hormone replacement therapy (postmenopausal): Secondary | ICD-10-CM | POA: Diagnosis not present

## 2019-11-30 DIAGNOSIS — K831 Obstruction of bile duct: Secondary | ICD-10-CM | POA: Diagnosis present

## 2019-11-30 DIAGNOSIS — Z7982 Long term (current) use of aspirin: Secondary | ICD-10-CM | POA: Diagnosis not present

## 2019-11-30 DIAGNOSIS — E039 Hypothyroidism, unspecified: Secondary | ICD-10-CM | POA: Diagnosis present

## 2019-11-30 DIAGNOSIS — R160 Hepatomegaly, not elsewhere classified: Secondary | ICD-10-CM

## 2019-11-30 DIAGNOSIS — Z66 Do not resuscitate: Secondary | ICD-10-CM | POA: Diagnosis present

## 2019-11-30 DIAGNOSIS — Z6823 Body mass index (BMI) 23.0-23.9, adult: Secondary | ICD-10-CM | POA: Diagnosis not present

## 2019-11-30 DIAGNOSIS — R1084 Generalized abdominal pain: Secondary | ICD-10-CM

## 2019-11-30 DIAGNOSIS — K5909 Other constipation: Secondary | ICD-10-CM | POA: Insufficient documentation

## 2019-11-30 DIAGNOSIS — R7401 Elevation of levels of liver transaminase levels: Secondary | ICD-10-CM

## 2019-11-30 DIAGNOSIS — D72829 Elevated white blood cell count, unspecified: Secondary | ICD-10-CM

## 2019-11-30 DIAGNOSIS — Z95828 Presence of other vascular implants and grafts: Secondary | ICD-10-CM

## 2019-11-30 DIAGNOSIS — J449 Chronic obstructive pulmonary disease, unspecified: Secondary | ICD-10-CM | POA: Diagnosis present

## 2019-11-30 DIAGNOSIS — E860 Dehydration: Secondary | ICD-10-CM | POA: Diagnosis present

## 2019-11-30 DIAGNOSIS — I10 Essential (primary) hypertension: Secondary | ICD-10-CM | POA: Diagnosis present

## 2019-11-30 LAB — CMP (CANCER CENTER ONLY)
ALT: 384 U/L (ref 0–44)
AST: 185 U/L (ref 15–41)
Albumin: 3 g/dL — ABNORMAL LOW (ref 3.5–5.0)
Alkaline Phosphatase: 539 U/L — ABNORMAL HIGH (ref 38–126)
Anion gap: 14 (ref 5–15)
BUN: 9 mg/dL (ref 8–23)
CO2: 28 mmol/L (ref 22–32)
Calcium: 10.1 mg/dL (ref 8.9–10.3)
Chloride: 88 mmol/L — ABNORMAL LOW (ref 98–111)
Creatinine: 0.67 mg/dL (ref 0.61–1.24)
GFR, Est AFR Am: >60 mL/min (ref >60)
GFR, Estimated: >60 mL/min (ref >60)
Glucose, Bld: 117 mg/dL — ABNORMAL HIGH (ref 70–99)
Potassium: 3.6 mmol/L (ref 3.5–5.1)
Sodium: 130 mmol/L — ABNORMAL LOW (ref 135–145)
Total Bilirubin: 8.5 mg/dL (ref 0.3–1.2)
Total Protein: 7.3 g/dL (ref 6.5–8.1)

## 2019-11-30 LAB — CBC WITH DIFFERENTIAL (CANCER CENTER ONLY)
Abs Immature Granulocytes: 0.11 10*3/uL — ABNORMAL HIGH (ref 0.00–0.07)
Basophils Absolute: 0 10*3/uL (ref 0.0–0.1)
Basophils Relative: 0 %
Eosinophils Absolute: 0 10*3/uL (ref 0.0–0.5)
Eosinophils Relative: 0 %
HCT: 47.4 % (ref 39.0–52.0)
Hemoglobin: 16.8 g/dL (ref 13.0–17.0)
Immature Granulocytes: 1 %
Lymphocytes Relative: 3 %
Lymphs Abs: 0.5 10*3/uL — ABNORMAL LOW (ref 0.7–4.0)
MCH: 34.7 pg — ABNORMAL HIGH (ref 26.0–34.0)
MCHC: 35.4 g/dL (ref 30.0–36.0)
MCV: 97.9 fL (ref 80.0–100.0)
Monocytes Absolute: 2 10*3/uL — ABNORMAL HIGH (ref 0.1–1.0)
Monocytes Relative: 12 %
Neutro Abs: 14.4 10*3/uL — ABNORMAL HIGH (ref 1.7–7.7)
Neutrophils Relative %: 84 %
Platelet Count: 347 10*3/uL (ref 150–400)
RBC: 4.84 MIL/uL (ref 4.22–5.81)
RDW: 15.1 % (ref 11.5–15.5)
WBC Count: 17.1 10*3/uL — ABNORMAL HIGH (ref 4.0–10.5)
nRBC: 0 % (ref 0.0–0.2)

## 2019-11-30 MED ORDER — MORPHINE SULFATE (PF) 2 MG/ML IV SOLN
2.0000 mg | Freq: Once | INTRAVENOUS | Status: AC
  Administered 2019-11-30: 2 mg via INTRAVENOUS

## 2019-11-30 MED ORDER — POLYETHYLENE GLYCOL 3350 17 G PO PACK
17.0000 g | PACK | Freq: Every day | ORAL | Status: DC
  Administered 2019-11-30 – 2019-12-01 (×2): 17 g via ORAL
  Filled 2019-11-30 (×3): qty 1

## 2019-11-30 MED ORDER — POLYETHYLENE GLYCOL 3350 17 G PO PACK: 17.0000 g | PACK | Freq: Every day | ORAL | Status: DC | PRN

## 2019-11-30 MED ORDER — LEVALBUTEROL HCL 0.63 MG/3ML IN NEBU: 0.6300 mg | INHALATION_SOLUTION | Freq: Four times a day (QID) | RESPIRATORY_TRACT | Status: DC | PRN

## 2019-11-30 MED ORDER — DOCUSATE SODIUM 100 MG PO CAPS
200.0000 mg | ORAL_CAPSULE | Freq: Every day | ORAL | Status: DC
  Administered 2019-11-30 – 2019-12-03 (×4): 200 mg via ORAL
  Filled 2019-11-30 (×4): qty 2

## 2019-11-30 MED ORDER — BISACODYL 5 MG PO TBEC
10.0000 mg | DELAYED_RELEASE_TABLET | Freq: Every day | ORAL | Status: DC
  Administered 2019-11-30 – 2019-12-03 (×4): 10 mg via ORAL
  Filled 2019-11-30 (×4): qty 2

## 2019-11-30 MED ORDER — HEPARIN SOD (PORK) LOCK FLUSH 100 UNIT/ML IV SOLN
500.0000 [IU] | Freq: Once | INTRAVENOUS | Status: AC | PRN
  Administered 2019-11-30: 500 [IU]
  Filled 2019-11-30: qty 5

## 2019-11-30 MED ORDER — SODIUM CHLORIDE 0.9 % IV SOLN
Freq: Once | INTRAVENOUS | Status: AC
  Filled 2019-11-30: qty 250

## 2019-11-30 MED ORDER — MORPHINE SULFATE (PF) 2 MG/ML IV SOLN
INTRAVENOUS | Status: AC
  Filled 2019-11-30: qty 1

## 2019-11-30 MED ORDER — SODIUM CHLORIDE 0.9 % IV SOLN: INTRAVENOUS | Status: DC

## 2019-11-30 MED ORDER — LEVOTHYROXINE SODIUM 50 MCG PO TABS
50.0000 ug | ORAL_TABLET | Freq: Every day | ORAL | Status: DC
  Administered 2019-12-01 – 2019-12-03 (×3): 50 ug via ORAL
  Filled 2019-11-30 (×3): qty 1

## 2019-11-30 MED ORDER — MORPHINE SULFATE (PF) 2 MG/ML IV SOLN
1.0000 mg | Freq: Once | INTRAVENOUS | Status: AC
  Administered 2019-11-30: 1 mg via INTRAVENOUS

## 2019-11-30 MED ORDER — METOPROLOL TARTRATE 25 MG PO TABS
12.5000 mg | ORAL_TABLET | Freq: Two times a day (BID) | ORAL | Status: DC
  Administered 2019-11-30 – 2019-12-03 (×6): 12.5 mg via ORAL
  Filled 2019-11-30 (×6): qty 1

## 2019-11-30 MED ORDER — LISINOPRIL 20 MG PO TABS
40.0000 mg | ORAL_TABLET | Freq: Every day | ORAL | Status: DC
  Administered 2019-11-30 – 2019-12-03 (×4): 40 mg via ORAL
  Filled 2019-11-30 (×4): qty 2

## 2019-11-30 MED ORDER — SODIUM CHLORIDE 0.9% FLUSH
10.0000 mL | INTRAVENOUS | Status: DC | PRN
  Administered 2019-11-30: 10 mL
  Filled 2019-11-30: qty 10

## 2019-11-30 NOTE — Progress Notes (Signed)
[facility] Progress Note   Ricky Conway [id number] July 04, 1945 74 y.o.  Ricky Conway is managed by Dr. Dominica Severin B. Sherrill  Actively treated with chemotherapy/immunotherapy/hormonal therapy: yes  Current therapy: Gemzar   Last treated: 11/12/2019 (cycle 3, day 1)  Next scheduled appointment with provider: 12/04/2019  Assessment: Plan:    Generalized abdominal pain - Plan: morphine 2 MG/ML injection 1 mg, 0.9 %  sodium chloride infusion  Common biliary duct obstruction  Malignant neoplasm of lung, unspecified laterality, unspecified part of lung (HCC)  Liver masses  Hyperbilirubinemia   Abdominal pain: Ricky Conway was given morphine sulfate 1 mg IV x 1  Common bile duct obstruction with elevated LFT's: Ricky Conway  Is status post placement of an endoscopic stent of the biliary duct on 09/09/2018 under the care of Dr. Ronnette Juniper. His labs today show elevations of liver enzymes with an AST at 185, ALT at 384, alkaline phosphatase at 539 and bilirubin at 8.5.   Metastatic lung cancer with liver metastasis: Ricky Conway is managed by Dr. Dominica Severin B. Sherrill and is status post cycle 3, day 1 of Gemzar which was dosed on 11/12/2019. He is scheduled to be seen next on 12/04/2019.  Please see After Visit Summary for patient specific instructions.  Future Appointments  Date Time Provider [place]  12/04/2019  2:00 PM [facility] LAB 6 [facility] None  12/04/2019  2:15 PM [facility] [place] None  12/04/2019  2:45 PM Owens Shark, NP [facility] None  12/04/2019  3:45 PM [facility] INFUSION [facility] None  12/24/2019 11:30 AM [facility]FLUSH [facility] None  12/24/2019 11:45 AM [facility] [place] FLUSH [facility] None  12/24/2019 12:15 PM Owens Shark, NP [facility] None  12/24/2019  1:00 PM [facility] INFUSION [facility] None    No orders of the defined types were placed in this encounter.      Subjective:   Patient ID:  Ricky Conway is a 74  y.o. (DOB June 17, 1946) male.  Chief Complaint:  Chief Complaint  Patient presents with  . Abdominal Distention/Pain  . Jaundice  . Fatigue    HPI Ricky Conway  is a 74 y.o. male with a diagnosis of a metastatic lung cancer with liver metastasis and a history of a common bile duct obstruction. He is managed by Dr. Dominica Severin B. Sherrill and is status post cycle 3, day 1 of Gemzar which was dosed on 11/12/2019.He presents today with new onset generalized abdominal pain and distention since last Friday. He has a 5 day history of constipation despite 4 Dulcolax and an enema. An abdominal x-ray from today showed constipation but no obstruction. He has a history of a common bile duct obstruction and is  status post placement of an endoscopic stent of the biliary duct on 09/09/2018 under the care of Dr. Ronnette Juniper. He reports jaundice since yesterday. His labs today show elevations of liver enzymes with an AST at 185, ALT at 384, alkaline phosphatase at 539 and bilirubin at 8.5. He reports shortness of breath.  Medications: I have reviewed the patient's current medications.  Allergies: No Known Allergies  Past Medical History:  Diagnosis Date  . 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 COPD (chronic obstructive pulmonary disease) ([place])   . Dyspnea   . Hyperlipidemia   . Hypertension   . lung ca dx'd 08/2018    Past Surgical History:  Procedure Laterality Date  . BILIARY BRUSHING  09/09/2018   Procedure: BILIARY BRUSHING;  Surgeon: Therisa Doyne,  Megan Salon, MD;  Location: [facility] ENDOSCOPY;  Service: Gastroenterology;;  . BILIARY STENT PLACEMENT  09/09/2018   Procedure: BILIARY STENT PLACEMENT;  Surgeon: Ronnette Juniper, MD;  Location: [place];  Service: Gastroenterology;;  . ERCP N/A 09/09/2018   Procedure: ENDOSCOPIC RETROGRADE CHOLANGIOPANCREATOGRAPHY (ERCP);  Surgeon: Ronnette Juniper, MD;  Location: [place];  Service: Gastroenterology;  Laterality: N/A;  . FOOT SURGERY    . HERNIA REPAIR     WITH MESH    . IR IMAGING GUIDED PORT INSERTION  09/14/2019  . REMOVAL OF STONES  09/09/2018   Procedure: REMOVAL OF STONES;  Surgeon: Ronnette Juniper, MD;  Location: [place];  Service: Gastroenterology;;  . Joan Mayans  09/09/2018   Procedure: Joan Mayans;  Surgeon: Ronnette Juniper, MD;  Location: [facility] ENDOSCOPY;  Service: Gastroenterology;;    Family History  Problem Relation Age of Onset  . Diabetes Neg Hx   . CAD Neg Hx   . Cancer Neg Hx     Social History   Socioeconomic History  . Marital status: Married    Spouse name: Not on file  . Number of children: Not on file  . Years of education: Not on file  . Highest education level: Not on file  Occupational History  . Not on file  Tobacco Use  . Smoking status: Heavy Tobacco Smoker    Packs/day: 1.00    Years: 60.00    Pack years: 60.00    Types: Cigarettes  . Smokeless tobacco: Never Used  . Tobacco comment: Not currently ready to quit  Substance and Sexual Activity  . Alcohol use: Yes    Alcohol/week: 0.0 standard drinks    Comment: OCCASIONAL  . Drug use: Never  . Sexual activity: Not on file  Other Topics Concern  . Not on file  Social History Narrative  . Not on file   Social Determinants of Health   Financial Resource Strain:   . Difficulty of Paying Living Expenses:   Food Insecurity:   . Worried About Charity fundraiser in the Last Year:   . Arboriculturist in the Last Year:   Transportation Needs:   . Film/video editor (Medical):   Marland Kitchen Lack of Transportation (Non-Medical):   Physical Activity:   . Days of Exercise per Week:   . Minutes of Exercise per Session:   Stress:   . Feeling of Stress :   Social Connections:   . Frequency of Communication with Friends and Family:   . Frequency of Social Gatherings with Friends and Family:   . Attends Religious Services:   . Active Member of Clubs or Organizations:   . Attends Archivist Meetings:   Marland Kitchen Marital Status:   Intimate Partner Violence:   .  Fear of Current or Ex-Partner:   . Emotionally Abused:   Marland Kitchen Physically Abused:   . Sexually Abused:     Past Medical History, Surgical history, Social history, and Family history were reviewed and updated as appropriate.   Please see review of systems for further details on the patient's review from today.   Review of Systems:  Review of Systems  Constitutional: Positive for appetite change. Negative for activity change, chills, diaphoresis and fever.  HENT: Negative for trouble swallowing.   Respiratory: Negative for cough, chest tightness and shortness of breath.   Cardiovascular: Negative for chest pain, palpitations and leg swelling.  Gastrointestinal: Positive for abdominal distention, abdominal pain and constipation. Negative for diarrhea, nausea and vomiting.  Skin: Positive for  color change.    Objective:   Physical Exam:  BP (!) 168/97   Pulse 84   Temp 98.4 F (36.9 C) (Oral)   Resp 18   Wt 77.2 kg (170 lb 4 oz)   SpO2 96% Comment: 2L [place]  BMI 24.43 kg/m  ECOG: 1  Physical Exam Constitutional:      General: He is not in acute distress.    Appearance: He is not diaphoretic.  HENT:     Head: Normocephalic and atraumatic.     Mouth/Throat:     Pharynx: No oropharyngeal exudate.  Eyes:     General: Scleral icterus present.        Right eye: No discharge.        Left eye: No discharge.  Cardiovascular:     Rate and Rhythm: Normal rate and regular rhythm.     Heart sounds: Normal heart sounds. No murmur. No friction rub. No gallop.   Pulmonary:     Effort: Pulmonary effort is normal. No respiratory distress.     Breath sounds: Normal breath sounds. No wheezing or rales.  Abdominal:     General: Bowel sounds are normal. There is distension.     Palpations: Abdomen is soft. There is no mass.     Tenderness: There is abdominal tenderness. There is no guarding or rebound.  Musculoskeletal:     Cervical back: Normal range of motion and neck supple.    Lymphadenopathy:     Cervical: No cervical adenopathy.  Skin:    General: Skin is warm and dry.     Coloration: Skin is jaundiced.     Findings: No erythema or rash.  Neurological:     Mental Status: He is alert.     Coordination: Coordination normal.     Lab Review:     Component Value Date/Time   NA 130 (L) 11/30/2019 1037   K 3.6 11/30/2019 1037   CL 88 (L) 11/30/2019 1037   CO2 28 11/30/2019 1037   GLUCOSE 117 (H) 11/30/2019 1037   BUN 9 11/30/2019 1037   CREATININE 0.67 11/30/2019 1037   CALCIUM 10.1 11/30/2019 1037   PROT 7.3 11/30/2019 1037   ALBUMIN 3.0 (L) 11/30/2019 1037   AST 185 (HH) 11/30/2019 1037   ALT 384 (HH) 11/30/2019 1037   ALKPHOS 539 (H) 11/30/2019 1037   BILITOT 8.5 (HH) 11/30/2019 1037   GFRNONAA >60 11/30/2019 1037   GFRAA >60 11/30/2019 1037       Component Value Date/Time   WBC 17.1 (H) 11/30/2019 1037   WBC 10.8 (H) 09/14/2019 1015   RBC 4.84 11/30/2019 1037   HGB 16.8 11/30/2019 1037   HCT 47.4 11/30/2019 1037   PLT 347 11/30/2019 1037   MCV 97.9 11/30/2019 1037   MCH 34.7 (H) 11/30/2019 1037   MCHC 35.4 11/30/2019 1037   RDW 15.1 11/30/2019 1037   LYMPHSABS 0.5 (L) 11/30/2019 1037   MONOABS 2.0 (H) 11/30/2019 1037   EOSABS 0.0 11/30/2019 1037   BASOSABS 0.0 11/30/2019 1037   -------------------------------  Imaging from last 24 hours (if applicable):  Radiology interpretation: CT CHEST W CONTRAST  Result Date: 11/19/2019 CLINICAL DATA:  Non-small cell lung cancer, restaging. EXAM: CT CHEST, ABDOMEN, AND PELVIS WITH CONTRAST TECHNIQUE: Multidetector CT imaging of the chest, abdomen and pelvis was performed following the standard protocol during bolus administration of intravenous contrast. CONTRAST:  170mL OMNIPAQUE IOHEXOL 300 MG/ML  SOLN COMPARISON:  09/08/2019 FINDINGS: CT CHEST FINDINGS Cardiovascular: Moderate cardiac enlargement. Aortic  atherosclerosis. Extensive 3 vessel coronary artery atherosclerotic calcifications. No  pericardial effusion. Mediastinum/Nodes: Normal appearance of the thyroid gland. The trachea appears patent and is midline. Normal appearance of the esophagus. Index low thoracic periaortic lymph node measures 0.6 cm, image 47/2. Previously 7 mm. No mediastinal or hilar adenopathy identified. Lungs/Pleura: No pleural effusion. Advanced changes of centrilobular and paraseptal emphysema. No airspace consolidation, atelectasis, or pneumothorax. Subpleural nodule within the lingula is unchanged measuring 4 mm. Right middle lobe perifissural nodule measures 6 mm, image 101/4. Previously this was measured at 7 mm. Index right lower lobe lung nodule measures 3 mm, image 68/4. Stable lateral left upper lobe lung nodule measures 3 mm, image 82/4. Stable. -Other scattered milli metric lung nodules remain unchanged. Musculoskeletal: Remote left rib deformities are again noted. No acute or suspicious osseous abnormalities. CT ABDOMEN PELVIS FINDINGS Hepatobiliary: Multifocal liver metastases are again identified. Index lesion in segment 4 a measures 3.4 x 3.0 cm, image 51/2. Previously 2.7 by 2.3 cm. Index lesion within segment 5 measures 1.8 x 1.4 cm, image 64/2. Previously 1.7 x 1.5 cm. Index lesion within segment 2/3 measures the 2.1 x 2.9 cm, image 55/2. Previously 3.1 x 3.3 cm. Large confluent mass within posterior aspect of segment 7 measures 7.2 x 6.9 cm, unchanged from previous study. Posterior dome of liver lesion measures 1.3 x 1.2 cm, image 48/2. Previously 1.0 x 1.0 cm Similar is parents of mild bladder wall thickening with common bile duct stent in place. Pneumobilia is noted confirming biliary patency. Pancreas: Unremarkable. No pancreatic ductal dilatation or surrounding inflammatory changes. Spleen: Normal in size without focal abnormality. Adrenals/Urinary Tract: Normal right adrenal gland. Left adrenal gland appears hypertrophic, similar to previous study. No kidney mass or hydronephrosis.  Urinary bladder is  unremarkable. Stomach/Bowel: Stomach is within normal limits. Appendix is not confidently identified on today's study. No evidence of bowel wall thickening, distention, or inflammatory changes. Vascular/Lymphatic: Aortic atherosclerosis. Mild aneurysmal dilatation of the infrarenal abdominal aorta measuring 3 cm. Unchanged. Previous, index left retroperitoneal lymph node measures 0.7 cm, image 75/2. Previously 1 cm. Reproductive: Prostate gland enlargement. Other: No free fluid or fluid collections identified. Musculoskeletal: Degenerative disc disease identified. No suspicious bone lesions identified. IMPRESSION: 1. Multifocal liver metastases are again identified. When compared with the previous exam some lesions measures smaller while others are slightly increased in size in the interval. The overall tumor burden within the liver however appears relatively stable in the interval. 2. Scattered small pulmonary nodules are stable from previous exam. 3. No significant change in the appearance of right posterior mediastinal periaortic lymph node and left retroperitoneal lymph node. 4. Aortic atherosclerosis 3 vessel coronary artery atherosclerotic calcifications noted. 5. Stable 3 cm infrarenal abdominal aortic aneurysm. Aortic aneurysm NOS (ICD10-I71.9). Aortic Atherosclerosis (ICD10-I70.0) and Emphysema (ICD10-J43.9). Electronically Signed   By: Kerby Moors M.D.   On: 11/19/2019 15:49   CT ABDOMEN PELVIS W CONTRAST  Result Date: 11/19/2019 CLINICAL DATA:  Non-small cell lung cancer, restaging. EXAM: CT CHEST, ABDOMEN, AND PELVIS WITH CONTRAST TECHNIQUE: Multidetector CT imaging of the chest, abdomen and pelvis was performed following the standard protocol during bolus administration of intravenous contrast. CONTRAST:  137mL OMNIPAQUE IOHEXOL 300 MG/ML  SOLN COMPARISON:  09/08/2019 FINDINGS: CT CHEST FINDINGS Cardiovascular: Moderate cardiac enlargement. Aortic atherosclerosis. Extensive 3 vessel coronary  artery atherosclerotic calcifications. No pericardial effusion. Mediastinum/Nodes: Normal appearance of the thyroid gland. The trachea appears patent and is midline. Normal appearance of the esophagus. Index low thoracic periaortic lymph node measures  0.6 cm, image 47/2. Previously 7 mm. No mediastinal or hilar adenopathy identified. Lungs/Pleura: No pleural effusion. Advanced changes of centrilobular and paraseptal emphysema. No airspace consolidation, atelectasis, or pneumothorax. Subpleural nodule within the lingula is unchanged measuring 4 mm. Right middle lobe perifissural nodule measures 6 mm, image 101/4. Previously this was measured at 7 mm. Index right lower lobe lung nodule measures 3 mm, image 68/4. Stable lateral left upper lobe lung nodule measures 3 mm, image 82/4. Stable. -Other scattered milli metric lung nodules remain unchanged. Musculoskeletal: Remote left rib deformities are again noted. No acute or suspicious osseous abnormalities. CT ABDOMEN PELVIS FINDINGS Hepatobiliary: Multifocal liver metastases are again identified. Index lesion in segment 4 a measures 3.4 x 3.0 cm, image 51/2. Previously 2.7 by 2.3 cm. Index lesion within segment 5 measures 1.8 x 1.4 cm, image 64/2. Previously 1.7 x 1.5 cm. Index lesion within segment 2/3 measures the 2.1 x 2.9 cm, image 55/2. Previously 3.1 x 3.3 cm. Large confluent mass within posterior aspect of segment 7 measures 7.2 x 6.9 cm, unchanged from previous study. Posterior dome of liver lesion measures 1.3 x 1.2 cm, image 48/2. Previously 1.0 x 1.0 cm Similar is parents of mild bladder wall thickening with common bile duct stent in place. Pneumobilia is noted confirming biliary patency. Pancreas: Unremarkable. No pancreatic ductal dilatation or surrounding inflammatory changes. Spleen: Normal in size without focal abnormality. Adrenals/Urinary Tract: Normal right adrenal gland. Left adrenal gland appears hypertrophic, similar to previous study. No kidney  mass or hydronephrosis.  Urinary bladder is unremarkable. Stomach/Bowel: Stomach is within normal limits. Appendix is not confidently identified on today's study. No evidence of bowel wall thickening, distention, or inflammatory changes. Vascular/Lymphatic: Aortic atherosclerosis. Mild aneurysmal dilatation of the infrarenal abdominal aorta measuring 3 cm. Unchanged. Previous, index left retroperitoneal lymph node measures 0.7 cm, image 75/2. Previously 1 cm. Reproductive: Prostate gland enlargement. Other: No free fluid or fluid collections identified. Musculoskeletal: Degenerative disc disease identified. No suspicious bone lesions identified. IMPRESSION: 1. Multifocal liver metastases are again identified. When compared with the previous exam some lesions measures smaller while others are slightly increased in size in the interval. The overall tumor burden within the liver however appears relatively stable in the interval. 2. Scattered small pulmonary nodules are stable from previous exam. 3. No significant change in the appearance of right posterior mediastinal periaortic lymph node and left retroperitoneal lymph node. 4. Aortic atherosclerosis 3 vessel coronary artery atherosclerotic calcifications noted. 5. Stable 3 cm infrarenal abdominal aortic aneurysm. Aortic aneurysm NOS (ICD10-I71.9). Aortic Atherosclerosis (ICD10-I70.0) and Emphysema (ICD10-J43.9). Electronically Signed   By: Kerby Moors M.D.   On: 11/19/2019 15:49   DG Abd 2 Views  Result Date: 11/30/2019 CLINICAL DATA:  Metastatic lung cancer, constipation, mid abdominal pain/distension, evaluate for obstruction EXAM: ABDOMEN - 2 VIEW COMPARISON:  CT abdomen/pelvis dated 11/19/2019 FINDINGS: Nonobstructive bowel gas pattern. No evidence of free air under the diaphragm on the upright view. Moderate colonic stool burden, compatible with the clinical history of constipation. Common duct stent. Mild degenerative changes of the lumbar spine.  IMPRESSION: No evidence of small bowel obstruction or free air. Moderate colonic stool burden, compatible with the clinical history of constipation. Electronically Signed   By: Julian Hy M.D.   On: 11/30/2019 11:14        This patient was seen with Dr. Benay Spice with my treatment plan reviewed with him. He expressed agreement with my medical management of this patient.  This was a shared visit with  Sandi Mealy.  Ricky Conway was interviewed and examined.  He presents today with symptomatic biliary obstruction.  He was scheduled to begin gemcitabine/carboplatin chemotherapy on 11/26/2019.  Chemotherapy was held secondary to elevated liver enzymes.  A restaging CT  on 11/19/2019 did not reveal evidence of biliary obstruction.  He will be admitted for further evaluation.  I suspect biliary stent occlusion or obstruction by tumor.  I discussed the case with Dr. Michail Sermon and Dr. Therisa Doyne.  He will be evaluated by gastroenterology.  We will plan to resume systemic therapy depending on his recovery from the biliary obstruction.  Julieanne Manson, MD

## 2019-11-30 NOTE — Consult Note (Signed)
Referring Provider: Dr. Benay Spice Primary Care Physician:  Mayra Neer, MD Primary Gastroenterologist:  Dr. Therisa Doyne  Reason for Consultation: Suspected occlusion of CBD stent  HPI: Ricky Conway is a 74 y.o. male with history of metastatic lung cancer with liver metastasis s/p ERCP 09/09/2019 with placement of covered metal CBD stent presenting with hyperbilirubinemia and suspected occlusion of CBD stent.  Patient states she has been experiencing abdominal discomfort since Friday 6/4.  She states that the pain is in her lower abdomen, mostly left-sided.  He has also been experiencing nausea after eating for approximately 1 week and months has not been consuming adequate calories.  He reports an 8 pound weight loss over the last week.  Additionally, patient has been reporting constipation.  He has not had a bowel movement for 6 or 7 days.  On Saturday, 6/5, he took 2 Dulcolax, followed by 1 fleets enema, and followed by 2 more Dulcolax but still did not have a bowel movement.  He denies fever, chills, chest pain, shortness of breath, dysphagia, heartburn, vomiting, hematemesis, diarrhea, melena, hematochezia.  He takes 81mg  aspirin daily but denies any NSAID or blood thinner use.  Records reviewed: CT 11/19/19 showed Multifocal liver metastases are again identified. When compared with the previous exam some lesions measures smaller while others are slightly increased in size in the interval. The overall tumor burden within the liver however appears relatively stable in the interval.  Similar is parents of mild bladder wall thickening with common bile duct stent in place. Pneumobilia is noted confirming biliary patency.  Past Medical History:  Diagnosis Date  . 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 COPD (chronic obstructive pulmonary disease) ([place])   . Dyspnea   . Hyperlipidemia   . Hypertension   . lung ca dx'd 08/2018    Past Surgical History:  Procedure Laterality Date  .  BILIARY BRUSHING  09/09/2018   Procedure: BILIARY BRUSHING;  Surgeon: Ronnette Juniper, MD;  Location: [facility] ENDOSCOPY;  Service: Gastroenterology;;  . BILIARY STENT PLACEMENT  09/09/2018   Procedure: BILIARY STENT PLACEMENT;  Surgeon: Ronnette Juniper, MD;  Location: [place];  Service: Gastroenterology;;  . ERCP N/A 09/09/2018   Procedure: ENDOSCOPIC RETROGRADE CHOLANGIOPANCREATOGRAPHY (ERCP);  Surgeon: Ronnette Juniper, MD;  Location: [place];  Service: Gastroenterology;  Laterality: N/A;  . FOOT SURGERY    . HERNIA REPAIR     WITH MESH  . IR IMAGING GUIDED PORT INSERTION  09/14/2019  . REMOVAL OF STONES  09/09/2018   Procedure: REMOVAL OF STONES;  Surgeon: Ronnette Juniper, MD;  Location: [place];  Service: Gastroenterology;;  . Joan Mayans  09/09/2018   Procedure: Joan Mayans;  Surgeon: Ronnette Juniper, MD;  Location: [place];  Service: Gastroenterology;;    Prior to Admission medications   Medication Sig Start Date End Date Taking? Authorizing Provider  aspirin EC 81 MG tablet Take 81 mg by mouth daily.    [provider]  bisacodyl (DULCOLAX) 5 MG EC tablet Take 5 mg by mouth daily as needed for moderate constipation.    [provider]  furosemide (LASIX) 20 MG tablet Take 20 mg by mouth daily. 06/29/18   [provider]  levothyroxine (SYNTHROID) 50 MCG tablet TAKE 1 TABLET BY MOUTH DAILY BEFORE BREAKFAST 09/16/19   Ladell Pier, MD  lidocaine-prilocaine (EMLA) cream Apply 1 application topically as needed. Patient not taking: Reported on 10/15/2019 09/16/19   Ladell Pier, MD  lisinopril (PRINIVIL,ZESTRIL) 40 MG tablet Take 40 mg by mouth daily. 06/24/18  [provider]  Multiple Vitamins-Minerals (PRESERVISION AREDS PO) Take 1 tablet by mouth 2 (two) times a day.    [provider]  prochlorperazine (COMPAZINE) 5 MG tablet Take 1 tablet (5 mg total) by mouth every 6 (six) hours as needed for nausea or vomiting. 10/03/18   Owens Shark,  NP  Saw Palmetto, Serenoa repens, (SAW PALMETTO PO) Take 1 capsule by mouth daily.     [provider]  simvastatin (ZOCOR) 40 MG tablet Take 20 mg by mouth daily at 6 PM.  06/08/18   [provider]     Allergies as of 11/30/2019  . (No Known Allergies)    Family History  Problem Relation Age of Onset  . Diabetes Neg Hx   . CAD Neg Hx   . Cancer Neg Hx     Social History   Socioeconomic History  . Marital status: Married    Spouse name: Not on file  . Number of children: Not on file  . Years of education: Not on file  . Highest education level: Not on file  Occupational History  . Not on file  Tobacco Use  . Smoking status: Heavy Tobacco Smoker    Packs/day: 1.00    Years: 60.00    Pack years: 60.00    Types: Cigarettes  . Smokeless tobacco: Never Used  . Tobacco comment: Not currently ready to quit  Substance and Sexual Activity  . Alcohol use: Yes    Alcohol/week: 0.0 standard drinks    Comment: OCCASIONAL  . Drug use: Never  . Sexual activity: Not on file  Other Topics Concern  . Not on file  Social History Narrative  . Not on file   Social Determinants of Health   Financial Resource Strain:   . Difficulty of Paying Living Expenses:   Food Insecurity:   . Worried About Charity fundraiser in the Last Year:   . Arboriculturist in the Last Year:   Transportation Needs:   . Film/video editor (Medical):   Marland Kitchen Lack of Transportation (Non-Medical):   Physical Activity:   . Days of Exercise per Week:   . Minutes of Exercise per Session:   Stress:   . Feeling of Stress :   Social Connections:   . Frequency of Communication with Friends and Family:   . Frequency of Social Gatherings with Friends and Family:   . Attends Religious Services:   . Active Member of Clubs or Organizations:   . Attends Archivist Meetings:   Marland Kitchen Marital Status:   Intimate Partner Violence:   . Fear of Current or Ex-Partner:   . Emotionally Abused:    Marland Kitchen Physically Abused:   . Sexually Abused:     Review of Systems: Review of Systems  Constitutional: Positive for weight loss. Negative for chills and fever.  HENT: Negative for hearing loss and tinnitus.   Eyes: Negative for pain and redness.  Respiratory: Negative for cough and shortness of breath.   Cardiovascular: Negative for chest pain and palpitations.  Gastrointestinal: Positive for abdominal pain (left lower abdomen), constipation and nausea. Negative for blood in stool, diarrhea, heartburn, melena and vomiting.  Genitourinary: Negative for flank pain and hematuria.  Musculoskeletal: Negative for back pain and falls.  Skin: Negative for itching and rash.       jaundice  Neurological: Negative for seizures and loss of consciousness.  Endo/Heme/Allergies: Negative for polydipsia. Does not bruise/bleed easily.  Psychiatric/Behavioral: Negative for  memory loss and substance abuse.    Physical Exam: Vital signs: Vitals:   11/30/19 1652  BP: (!) 167/82  Pulse: (!) 108  Temp: 98.8 F (37.1 C)  SpO2: 90%     Physical Exam  Constitutional: He is oriented to person, place, and time. He appears well-developed and well-nourished. No distress.  HENT:  Head: Normocephalic and atraumatic.  [place] in place  Eyes: EOM are normal. Scleral icterus is present.  Cardiovascular: Regular rhythm and normal heart sounds. Tachycardia present.  Pulmonary/Chest: Effort normal and breath sounds normal. No respiratory distress.  mildly tachypneic  Abdominal: Soft. He exhibits distension (mild). He exhibits no mass. Bowel sounds are decreased. There is abdominal tenderness (moderate epigastric and RUQ tenderness, mild LLQ tenderness). There is no rebound and no guarding.  Musculoskeletal:        General: No deformity or edema.     Cervical back: Normal range of motion and neck supple.  Neurological: He is alert and oriented to person, place, and time.  Skin: Skin is warm and dry.  Psychiatric:  He has a normal mood and affect. His behavior is normal.     GI:  Lab Results: Recent Labs    11/30/19 1037  WBC 17.1*  HGB 16.8  HCT 47.4  PLT 347   BMET Recent Labs    11/30/19 1037  NA 130*  K 3.6  CL 88*  CO2 28  GLUCOSE 117*  BUN 9  CREATININE 0.67  CALCIUM 10.1   LFT Recent Labs    11/30/19 1037  PROT 7.3  ALBUMIN 3.0*  AST 185*  ALT 384*  ALKPHOS 539*  BILITOT 8.5*   PT/INR No results for input(s): LABPROT, INR in the last 72 hours.   Studies/Results: DG Abd 2 Views  Result Date: 11/30/2019 CLINICAL DATA:  Metastatic lung cancer, constipation, mid abdominal pain/distension, evaluate for obstruction EXAM: ABDOMEN - 2 VIEW COMPARISON:  CT abdomen/pelvis dated 11/19/2019 FINDINGS: Nonobstructive bowel gas pattern. No evidence of free air under the diaphragm on the upright view. Moderate colonic stool burden, compatible with the clinical history of constipation. Common duct stent. Mild degenerative changes of the lumbar spine. IMPRESSION: No evidence of small bowel obstruction or free air. Moderate colonic stool burden, compatible with the clinical history of constipation. Electronically Signed   By: Julian Hy M.D.   On: 11/30/2019 11:14    Impression: Hyperbilirubinemia, suspected CBD stent occlusion -T bili 8.5/AST 185/ALT 384/ALP 539 -WBCs elevated to 17.1  Constipation: LLQ pain, lack of bowel movement for 6-7 days despite dulcolax and fleet enema.  Plan: -Patient will require ERCP to assess stent/CBD patency.  The procedure, nature, benefits and risks (including but not limited to pancreatitis, bleeding, infection, perforation, anesthesia/cardiac or pulmonary complications) were thoroughly discussed with the patient and he verbalized understanding.  He gave verbal consent to proceed with ERCP with Dr. Watt Climes.  Date/time to be determined. -Continue to trend LFTs -Continue bowel regimen as ordered by primary team (Miralax, dulcolax, colace daily).   I have added another dose of Miralax qd PRN in case patient continues to have constipation despite the aforementioned regimen. -Clear liquid diet for now.  [facility] will follow.   LOS: 0 days   Salley Slaughter  PA-C 11/30/2019, 5:18 PM/  Contact #  [phone]

## 2019-11-30 NOTE — Patient Instructions (Signed)

## 2019-11-30 NOTE — Progress Notes (Signed)
Pharmacist Chemotherapy Monitoring - Follow Up Assessment    I verify that I have reviewed each item in the below checklist:  . Regimen for the patient is scheduled for the appropriate day and plan matches scheduled date. Marland Kitchen Appropriate non-routine labs are ordered dependent on drug ordered. . If applicable, additional medications reviewed and ordered per protocol based on lifetime cumulative doses and/or treatment regimen.   Plan for follow-up and/or issues identified: No . I-vent associated with next due treatment: No . MD and/or nursing notified: No  Philomena Course 11/30/2019 3:47 PM

## 2019-11-30 NOTE — H&P (Addendum)
History and Physical    Ricky Conway SWF:[id number] DOB: 1946-05-22 DOA: 11/30/2019  PCP: Mayra Neer, MD Patient coming from: Cancer center  Chief Complaint: Constipation and generalized weakness and abdominal pain  HPI: Ricky Conway is a 74 y.o. male with medical history significant of metastatic lung cancer with liver mets and CBD obstruction admitted with generalized weakness decreased appetite decreased p.o. intake constipation and abdominal pain. Patient denied any fever chills or cough. He has chronic shortness of breath. He denied any nausea vomiting or diarrhea.  He denied any urinary complaints. Patient was a direct admit from cancer center due to concern for CBD obstruction with elevated LFTs.  Patient had biliary duct stent placed in March 2020 by Dr. Therisa Doyne. Patient is on oxygen at home 24/7 He is followed by Dr. Benay Spice and is on chemo last dose of chemo was 11/12/2019 the next 1 is scheduled for 12/04/2019.  He is on Gemzar. Dr. Benay Spice has contacted GI to see the patient while in hospital. He has been constipated for 5 days in spite of taking Dulcolax and and an enema at home. KUB obtained at the cancer center showed constipation with no evidence of obstruction. Patient complains of generalized abdominal pain and distention. His bilirubin today is 8.5 which was normal on 11/26/2019. ED Course: Direct admit from cancer center.  Review of Systems: As per HPI otherwise all other systems reviewed and are negative  Ambulatory Status: Ambulatory at baseline   Past Medical History:  Diagnosis Date  . 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 COPD (chronic obstructive pulmonary disease) ([place])   . Dyspnea   . Hyperlipidemia   . Hypertension   . lung ca dx'd 08/2018    Past Surgical History:  Procedure Laterality Date  . BILIARY BRUSHING  09/09/2018   Procedure: BILIARY BRUSHING;  Surgeon: Ronnette Juniper, MD;  Location: [facility] ENDOSCOPY;  Service: Gastroenterology;;  .  BILIARY STENT PLACEMENT  09/09/2018   Procedure: BILIARY STENT PLACEMENT;  Surgeon: Ronnette Juniper, MD;  Location: [place];  Service: Gastroenterology;;  . ERCP N/A 09/09/2018   Procedure: ENDOSCOPIC RETROGRADE CHOLANGIOPANCREATOGRAPHY (ERCP);  Surgeon: Ronnette Juniper, MD;  Location: [place];  Service: Gastroenterology;  Laterality: N/A;  . FOOT SURGERY    . HERNIA REPAIR     WITH MESH  . IR IMAGING GUIDED PORT INSERTION  09/14/2019  . REMOVAL OF STONES  09/09/2018   Procedure: REMOVAL OF STONES;  Surgeon: Ronnette Juniper, MD;  Location: [place];  Service: Gastroenterology;;  . Joan Mayans  09/09/2018   Procedure: Joan Mayans;  Surgeon: Ronnette Juniper, MD;  Location: [facility] ENDOSCOPY;  Service: Gastroenterology;;    Social History   Socioeconomic History  . Marital status: Married    Spouse name: Not on file  . Number of children: Not on file  . Years of education: Not on file  . Highest education level: Not on file  Occupational History  . Not on file  Tobacco Use  . Smoking status: Heavy Tobacco Smoker    Packs/day: 1.00    Years: 60.00    Pack years: 60.00    Types: Cigarettes  . Smokeless tobacco: Never Used  . Tobacco comment: Not currently ready to quit  Substance and Sexual Activity  . Alcohol use: Yes    Alcohol/week: 0.0 standard drinks    Comment: OCCASIONAL  . Drug use: Never  . Sexual activity: Not on file  Other Topics Concern  . Not on file  Social History Narrative  . Not  on file   Social Determinants of Health   Financial Resource Strain:   . Difficulty of Paying Living Expenses:   Food Insecurity:   . Worried About Charity fundraiser in the Last Year:   . Arboriculturist in the Last Year:   Transportation Needs:   . Film/video editor (Medical):   Marland Kitchen Lack of Transportation (Non-Medical):   Physical Activity:   . Days of Exercise per Week:   . Minutes of Exercise per Session:   Stress:   . Feeling of Stress :   Social Connections:   .  Frequency of Communication with Friends and Family:   . Frequency of Social Gatherings with Friends and Family:   . Attends Religious Services:   . Active Member of Clubs or Organizations:   . Attends Archivist Meetings:   Marland Kitchen Marital Status:   Intimate Partner Violence:   . Fear of Current or Ex-Partner:   . Emotionally Abused:   Marland Kitchen Physically Abused:   . Sexually Abused:     No Known Allergies  Family History  Problem Relation Age of Onset  . Diabetes Neg Hx   . CAD Neg Hx   . Cancer Neg Hx       Prior to Admission medications   Medication Sig Start Date End Date Taking? Authorizing Provider  aspirin EC 81 MG tablet Take 81 mg by mouth daily.    [provider]  bisacodyl (DULCOLAX) 5 MG EC tablet Take 5 mg by mouth daily as needed for moderate constipation.    [provider]  furosemide (LASIX) 20 MG tablet Take 20 mg by mouth daily. 06/29/18   [provider]  levothyroxine (SYNTHROID) 50 MCG tablet TAKE 1 TABLET BY MOUTH DAILY BEFORE BREAKFAST 09/16/19   Ladell Pier, MD  lidocaine-prilocaine (EMLA) cream Apply 1 application topically as needed. Patient not taking: Reported on 10/15/2019 09/16/19   Ladell Pier, MD  lisinopril (PRINIVIL,ZESTRIL) 40 MG tablet Take 40 mg by mouth daily. 06/24/18   [provider]  Multiple Vitamins-Minerals (PRESERVISION AREDS PO) Take 1 tablet by mouth 2 (two) times a day.    [provider]  prochlorperazine (COMPAZINE) 5 MG tablet Take 1 tablet (5 mg total) by mouth every 6 (six) hours as needed for nausea or vomiting. 10/03/18   Owens Shark, NP  Saw Palmetto, Serenoa repens, (SAW PALMETTO PO) Take 1 capsule by mouth daily.     [provider]  simvastatin (ZOCOR) 40 MG tablet Take 20 mg by mouth daily at 6 PM.  06/08/18   [provider]    Physical Exam: Vitals:   11/30/19 1652  BP: (!) 167/82  Pulse: (!) 108  Temp: 98.8 F (37.1 C)  TempSrc: Oral  SpO2:  90%     . General: Appears in mild distress jaundiced . Eyes:  PERRL, EOMI, jaundiced . ENT:  grossly normal hearing, lips & tongue, mmm . Neck: no LAD, masses or thyromegaly . Cardiovascular:  RRR, no m/r/g. No LE edema.  Marland Kitchen Respirator diminished breath sounds at the bases bilaterally, no w/r/r. Normal respiratory effort. . Abdomen:  soft, distended mild generalized tenderness NABS . Skin: Jaundiced . Musculoskeletal:  grossly normal tone BUE/BLE, good ROM, no bony abnormality . Psychiatric:  grossly normal mood and affect, speech fluent and appropriate, AOx3 . Neurologic:  CN 2-12 grossly intact, moves all extremities in coordinated fashion, sensation intact  Labs on Admission: I have personally reviewed  following labs and imaging studies  CBC: Recent Labs  Lab 11/26/19 0945 11/30/19 1037  WBC 11.0* 17.1*  NEUTROABS 8.5* 14.4*  HGB 16.0 16.8  HCT 47.0 47.4  MCV 101.1* 97.9  PLT 265 161   Basic Metabolic Panel: Recent Labs  Lab 11/26/19 0945 11/30/19 1037  NA 135 130*  K 3.8 3.6  CL 97* 88*  CO2 27 28  GLUCOSE 119* 117*  BUN 8 9  CREATININE 0.73 0.67  CALCIUM 9.1 10.1   GFR: Estimated Creatinine Clearance: 82.4 mL/min (by C-G formula based on SCr of 0.67 mg/dL). Liver Function Tests: Recent Labs  Lab 11/26/19 0945 11/30/19 1037  AST 239* 185*  ALT 191* 384*  ALKPHOS 225* 539*  BILITOT 0.7 8.5*  PROT 6.9 7.3  ALBUMIN 3.2* 3.0*   No results for input(s): LIPASE, AMYLASE in the last 168 hours. No results for input(s): AMMONIA in the last 168 hours. Coagulation Profile: No results for input(s): INR, PROTIME in the last 168 hours. Cardiac Enzymes: No results for input(s): CKTOTAL, CKMB, CKMBINDEX, TROPONINI in the last 168 hours. BNP (last 3 results) No results for input(s): PROBNP in the last 8760 hours. HbA1C: No results for input(s): HGBA1C in the last 72 hours. CBG: No results for input(s): GLUCAP in the last 168 hours. Lipid Profile: No results  for input(s): CHOL, HDL, LDLCALC, TRIG, CHOLHDL, LDLDIRECT in the last 72 hours. Thyroid Function Tests: No results for input(s): TSH, T4TOTAL, FREET4, T3FREE, THYROIDAB in the last 72 hours. Anemia Panel: No results for input(s): VITAMINB12, FOLATE, FERRITIN, TIBC, IRON, RETICCTPCT in the last 72 hours. Urine analysis:    Component Value Date/Time   COLORURINE YELLOW 12/24/2018 1655   APPEARANCEUR HAZY (A) 12/24/2018 1655   LABSPEC 1.021 12/24/2018 1655   PHURINE 5.0 12/24/2018 1655   GLUCOSEU NEGATIVE 12/24/2018 1655   HGBUR NEGATIVE 12/24/2018 1655   BILIRUBINUR NEGATIVE 12/24/2018 1655   KETONESUR 5 (A) 12/24/2018 1655   PROTEINUR NEGATIVE 12/24/2018 1655   NITRITE NEGATIVE 12/24/2018 1655   LEUKOCYTESUR NEGATIVE 12/24/2018 1655    Creatinine Clearance: Estimated Creatinine Clearance: 82.4 mL/min (by C-G formula based on SCr of 0.67 mg/dL).  Sepsis Labs: @LABRCNTIP (procalcitonin:4,lacticidven:4) )No results found for this or any previous visit (from the past 240 hour(s)).   Radiological Exams on Admission: DG Abd 2 Views  Result Date: 11/30/2019 CLINICAL DATA:  Metastatic lung cancer, constipation, mid abdominal pain/distension, evaluate for obstruction EXAM: ABDOMEN - 2 VIEW COMPARISON:  CT abdomen/pelvis dated 11/19/2019 FINDINGS: Nonobstructive bowel gas pattern. No evidence of free air under the diaphragm on the upright view. Moderate colonic stool burden, compatible with the clinical history of constipation. Common duct stent. Mild degenerative changes of the lumbar spine. IMPRESSION: No evidence of small bowel obstruction or free air. Moderate colonic stool burden, compatible with the clinical history of constipation. Electronically Signed   By: Julian Hy M.D.   On: 11/30/2019 11:14     Assessment/Plan Active Problems:   * No active hospital problems. *  #1 jaundice likely secondary to CBD obstruction in the setting of metastatic lung malignancy with mets to  the liver.  Patient with CBD stent placed in March 2020 by Dr. Therisa Doyne.  GI consulted by Dr. Benay Spice.   Bilirubin 8.5 up from 0.7 on 11/26/2019 AST 185 ALT 384 alkaline phosphatase is 539  #2 constipation-we'll start MiraLAX Dulcolax and Colace  #3 leukocytosis check chest x-ray and UA  #4 hyponatremia likely multifactorial patient does appear dehydrated hold Lasix and start normal  saline and recheck labs in a.m.  #5 metastatic lung malignancy with mets to the liver followed by Dr. Benay Spice  #6 hypertension restart home medications  #7 hypothyroidism continue Synthroid  #8 goals of care discussed with patient and wife in the room patient wishes to be DO NOT RESUSCITATE.  Severity of Illness: The appropriate patient status for this patient is INPATIENT. Inpatient status is judged to be reasonable and necessary in order to provide the required intensity of service to ensure the patient's safety. The patient's presenting symptoms, physical exam findings, and initial radiographic and laboratory data in the context of their chronic comorbidities is felt to place them at high risk for further clinical deterioration. Furthermore, it is not anticipated that the patient will be medically stable for discharge from the hospital within 2 midnights of admission. The following factors support the patient status of inpatient.   " The patient's presenting symptoms include constipation abdominal distention decreased appetite and jaundice. " The worrisome physical exam findings include jaundice dehydration " The initial radiographic and laboratory data are worrisome because of constipation jaundice dehydration hyponatremia elevated bilirubin " The chronic co-morbidities include metastatic lung cancer   * I certify that at the point of admission it is my clinical judgment that the patient will require inpatient hospital care spanning beyond 2 midnights from the point of admission due to high intensity of  service, high risk for further deterioration and high frequency of surveillance required.*  Estimated body mass index is 24.43 kg/m as calculated from the following:   Height as of 11/26/19: 5\' 10"  (1.778 m).   Weight as of an earlier encounter on 11/30/19: 77.2 kg.   DVT prophylaxis: SCD Code Status: DO NOT RESUSCITATE Family Communication: Discussed with wife at bedside Disposition Plan: Patient admitted with CBD duct obstruction with history of stent and metastatic lung cancer with mets to the liver GI consulted pending improvement   consults called: GI Admission status: Inpatient   Georgette Shell MD [facility]  If 7PM-7AM, please contact night-coverage [email or web address] Password [facility]  11/30/2019, 5:21 PM

## 2019-12-01 LAB — COMPREHENSIVE METABOLIC PANEL
ALT: 237 U/L — ABNORMAL HIGH (ref 0–44)
AST: 116 U/L — ABNORMAL HIGH (ref 15–41)
Albumin: 2.7 g/dL — ABNORMAL LOW (ref 3.5–5.0)
Alkaline Phosphatase: 366 U/L — ABNORMAL HIGH (ref 38–126)
Anion gap: 11 (ref 5–15)
BUN: 13 mg/dL (ref 8–23)
CO2: 27 mmol/L (ref 22–32)
Calcium: 8.7 mg/dL — ABNORMAL LOW (ref 8.9–10.3)
Chloride: 92 mmol/L — ABNORMAL LOW (ref 98–111)
Creatinine, Ser: 0.49 mg/dL — ABNORMAL LOW (ref 0.61–1.24)
GFR calc Af Amer: >60 mL/min (ref >60)
GFR calc non Af Amer: >60 mL/min (ref >60)
Glucose, Bld: 114 mg/dL — ABNORMAL HIGH (ref 70–99)
Potassium: 3.6 mmol/L (ref 3.5–5.1)
Sodium: 130 mmol/L — ABNORMAL LOW (ref 135–145)
Total Bilirubin: 7.4 mg/dL — ABNORMAL HIGH (ref 0.3–1.2)
Total Protein: 6.1 g/dL — ABNORMAL LOW (ref 6.5–8.1)

## 2019-12-01 LAB — CBC
HCT: 42.4 % (ref 39.0–52.0)
Hemoglobin: 14.5 g/dL (ref 13.0–17.0)
MCH: 34.2 pg — ABNORMAL HIGH (ref 26.0–34.0)
MCHC: 34.2 g/dL (ref 30.0–36.0)
MCV: 100 fL (ref 80.0–100.0)
Platelets: 320 10*3/uL (ref 150–400)
RBC: 4.24 MIL/uL (ref 4.22–5.81)
RDW: 15.4 % (ref 11.5–15.5)
WBC: 17.4 10*3/uL — ABNORMAL HIGH (ref 4.0–10.5)
nRBC: 0 % (ref 0.0–0.2)

## 2019-12-01 LAB — SARS CORONAVIRUS 2 BY RT PCR (HOSPITAL ORDER, PERFORMED IN ~~LOC~~ HOSPITAL LAB): SARS Coronavirus 2: NEGATIVE

## 2019-12-01 MED ORDER — SODIUM CHLORIDE 0.9 % IV SOLN
1.0000 g | INTRAVENOUS | Status: DC
  Filled 2019-12-01: qty 10

## 2019-12-01 MED ORDER — CHLORHEXIDINE GLUCONATE CLOTH 2 % EX PADS
6.0000 | MEDICATED_PAD | Freq: Every day | CUTANEOUS | Status: DC
  Administered 2019-12-03: 6 via TOPICAL

## 2019-12-01 MED ORDER — PIPERACILLIN-TAZOBACTAM 3.375 G IVPB
3.3750 g | Freq: Three times a day (TID) | INTRAVENOUS | Status: DC
  Administered 2019-12-01 – 2019-12-03 (×6): 3.375 g via INTRAVENOUS
  Filled 2019-12-01 (×7): qty 50

## 2019-12-01 MED ORDER — NICOTINE POLACRILEX 2 MG MT GUM
2.0000 mg | CHEWING_GUM | OROMUCOSAL | Status: DC | PRN
  Administered 2019-12-01: 2 mg via ORAL
  Filled 2019-12-01 (×2): qty 1

## 2019-12-01 MED ORDER — ALUM & MAG HYDROXIDE-SIMETH 200-200-20 MG/5ML PO SUSP
15.0000 mL | ORAL | Status: DC | PRN
  Administered 2019-12-01 – 2019-12-03 (×4): 15 mL via ORAL
  Filled 2019-12-01 (×4): qty 30

## 2019-12-01 NOTE — Progress Notes (Signed)
PROGRESS NOTE    Ricky Conway  BDZ:[id number] DOB: [birth year] DOA: 11/30/2019 PCP: Mayra Neer, MD    Brief Narrative:Ricky Conway is a 74 y.o. male with medical history significant of metastatic lung cancer with liver mets and CBD obstruction admitted with generalized weakness decreased appetite decreased p.o. intake constipation and abdominal pain. Patient denied any fever chills or cough. He has chronic shortness of breath. He denied any nausea vomiting or diarrhea.  He denied any urinary complaints. Patient was a direct admit from cancer center due to concern for CBD obstruction with elevated LFTs.  Patient had biliary duct stent placed in March 2020 by Dr. Therisa Doyne. Patient is on oxygen at home 24/7 He is followed by Dr. Benay Spice and is on chemo last dose of chemo was 11/12/2019 the next 1 is scheduled for 12/04/2019.  He is on Gemzar. Dr. Benay Spice has contacted GI to see the patient while in hospital. He has been constipated for 5 days in spite of taking Dulcolax and and an enema at home. KUB obtained at the cancer center showed constipation with no evidence of obstruction. Patient complains of generalized abdominal pain and distention. His bilirubin today is 8.5 which was normal on 11/26/2019. ED Course: Direct admit from cancer center.  Assessment & Plan:   Active Problems:   Liver masses   Common biliary duct obstruction   Hypertension   Hyperlipidemia   COPD, mild (HCC)   Malnutrition of moderate degree   Lung cancer (HCC)   Jaundice   Common bile duct (CBD) obstruction   Liver mass   Leukocytosis   #1 jaundice likely secondary to CBD obstruction in the setting of metastatic lung malignancy with mets to the liver.  Patient with CBD stent placed in March 2020 by Dr. Therisa Doyne.  GI consulted appreciate their input.  Patient to go to the OR today  Bilirubin 7.4 from 8.5 up from 0.7 on 11/26/2019 AST 116 from 185 ALT 237 from 384 alkaline phosphatase is 539 We will start  Rocephin empirically as patient continues to have leukocytosis in spite of IV hydration and n likely cover GI issues  #2 constipation-resolved on MiraLAX Dulcolax and Colace.   #3 leukocytosis-chest x-ray negative, UA ordered not done.    #4 hyponatremia likely multifactorial patient does appear dehydrated hold Lasix and start normal saline and recheck labs in a.m.  #5 metastatic lung malignancy with mets to the liver followed by Dr. Benay Spice  #6 hypertension continue current medications.   #7 hypothyroidism continue Synthroid  #8 goals of care discussed with patient and wife in the room patient wishes to be DO NOT RESUSCITATE. Estimated body mass index is 23.21 kg/m as calculated from the following:   Height as of this encounter: 5\' 11"  (1.803 m).   Weight as of this encounter: 75.5 kg.  DVT prophylaxis: SCD Code Status: DNR  family Communication: Discussed with patient and patient's wife at bedside yesterday Disposition Plan:  Status is: Inpatient  Dispo: The patient is from: Home              Anticipated d/c is to: Unknown              Anticipated d/c date is: Unknown              Patient currently is not medically stable to d/c.    Consultants: Oncology, GI  Procedures: None Antimicrobials: None Subjective: He is resting in bed asking for a cup of coffee no other complaints had a  large bowel movement last night  Objective: Vitals:   11/30/19 2209 12/01/19 0138 12/01/19 0604 12/01/19 1332  BP: 138/75 (!) 142/74 (!) 112/47 132/68  Pulse: (!) 105 91 82 72  Resp: 16 16 16 18   Temp: 98.6 F (37 C) 98.2 F (36.8 C) 99.1 F (37.3 C) 98.1 F (36.7 C)  TempSrc: Oral Oral Oral Oral  SpO2: (!) 89% 91% 92% 93%  Weight:      Height:       No intake or output data in the 24 hours ending 12/01/19 1438 Filed Weights   11/30/19 1732  Weight: 75.5 kg    Examination:  General exam: Appears calm and comfortable, jaundiced, scleral icterus noted Respiratory system:  Clear to auscultation. Respiratory effort normal. Cardiovascular system: S1 & S2 heard, RRR. No JVD, murmurs, rubs, gallops or clicks. No pedal edema. Gastrointestinal system: Abdomen is nondistended, soft and nontender. No organomegaly or masses felt. Normal bowel sounds heard. Central nervous system: Alert and oriented. No focal neurological deficits. Extremities: Symmetric 5 x 5 power. Skin: No rashes, lesions or ulcers Psychiatry: Judgement and insight appear normal. Mood & affect appropriate.     Data Reviewed: I have personally reviewed following labs and imaging studies  CBC: Recent Labs  Lab 11/26/19 0945 11/30/19 1037 12/01/19 0617  WBC 11.0* 17.1* 17.4*  NEUTROABS 8.5* 14.4*  --   HGB 16.0 16.8 14.5  HCT 47.0 47.4 42.4  MCV 101.1* 97.9 100.0  PLT 265 347 703   Basic Metabolic Panel: Recent Labs  Lab 11/26/19 0945 11/30/19 1037 12/01/19 0617  NA 135 130* 130*  K 3.8 3.6 3.6  CL 97* 88* 92*  CO2 27 28 27   GLUCOSE 119* 117* 114*  BUN 8 9 13   CREATININE 0.73 0.67 0.49*  CALCIUM 9.1 10.1 8.7*   GFR: Estimated Creatinine Clearance: 85 mL/min (A) (by C-G formula based on SCr of 0.49 mg/dL (L)). Liver Function Tests: Recent Labs  Lab 11/26/19 0945 11/30/19 1037 12/01/19 0617  AST 239* 185* 116*  ALT 191* 384* 237*  ALKPHOS 225* 539* 366*  BILITOT 0.7 8.5* 7.4*  PROT 6.9 7.3 6.1*  ALBUMIN 3.2* 3.0* 2.7*   No results for input(s): LIPASE, AMYLASE in the last 168 hours. No results for input(s): AMMONIA in the last 168 hours. Coagulation Profile: No results for input(s): INR, PROTIME in the last 168 hours. Cardiac Enzymes: No results for input(s): CKTOTAL, CKMB, CKMBINDEX, TROPONINI in the last 168 hours. BNP (last 3 results) No results for input(s): PROBNP in the last 8760 hours. HbA1C: No results for input(s): HGBA1C in the last 72 hours. CBG: No results for input(s): GLUCAP in the last 168 hours. Lipid Profile: No results for input(s): CHOL, HDL,  LDLCALC, TRIG, CHOLHDL, LDLDIRECT in the last 72 hours. Thyroid Function Tests: No results for input(s): TSH, T4TOTAL, FREET4, T3FREE, THYROIDAB in the last 72 hours. Anemia Panel: No results for input(s): VITAMINB12, FOLATE, FERRITIN, TIBC, IRON, RETICCTPCT in the last 72 hours. Sepsis Labs: No results for input(s): PROCALCITON, LATICACIDVEN in the last 168 hours.  No results found for this or any previous visit (from the past 240 hour(s)).       Radiology Studies: DG Chest 1 View  Result Date: 11/30/2019 CLINICAL DATA:  History of lung carcinoma.  Hypertension. EXAM: CHEST  1 VIEW COMPARISON:  Chest CT Nov 19, 2019 FINDINGS: Underlying emphysematous change noted. Scarring is present in the lung bases. There is no edema or airspace opacity. Heart size is within normal  limits. Pulmonary vascularity reflects a underlying emphysematous change with decreased vascularity right upper lobe. No adenopathy is appreciable by radiography. Port-A-Cath tip is in the superior vena cava. No bone lesions. There is aortic atherosclerosis. IMPRESSION: Underlying emphysematous change with areas of scarring. No edema or airspace opacity. Heart size within normal limits. Port-A-Cath tip in superior vena cava. Aortic Atherosclerosis (ICD10-I70.0). Electronically Signed   By: Lowella Grip III M.D.   On: 11/30/2019 19:56   DG Abd 2 Views  Result Date: 11/30/2019 CLINICAL DATA:  Metastatic lung cancer, constipation, mid abdominal pain/distension, evaluate for obstruction EXAM: ABDOMEN - 2 VIEW COMPARISON:  CT abdomen/pelvis dated 11/19/2019 FINDINGS: Nonobstructive bowel gas pattern. No evidence of free air under the diaphragm on the upright view. Moderate colonic stool burden, compatible with the clinical history of constipation. Common duct stent. Mild degenerative changes of the lumbar spine. IMPRESSION: No evidence of small bowel obstruction or free air. Moderate colonic stool burden, compatible with the  clinical history of constipation. Electronically Signed   By: Julian Hy M.D.   On: 11/30/2019 11:14        Scheduled Meds: . bisacodyl  10 mg Oral Daily  . docusate sodium  200 mg Oral Daily  . levothyroxine  50 mcg Oral Q0600  . lisinopril  40 mg Oral Daily  . metoprolol tartrate  12.5 mg Oral BID  . polyethylene glycol  17 g Oral Daily   Continuous Infusions: . sodium chloride 75 mL/hr at 12/01/19 0835     LOS: 1 day     Georgette Shell, MD  12/01/2019, 2:38 PM

## 2019-12-01 NOTE — Evaluation (Signed)
Physical Therapy Evaluation Patient Details Name: Ricky Conway MRN: [id number] DOB: [birth year] Today's Date: 12/01/2019   History of Present Illness  74 y.o. male with medical history significant of metastatic lung cancer with liver mets and CBD obstruction admitted with generalized weakness decreased appetite decreased p.o. intake constipation and abdominal pain.  Clinical Impression  Pt admitted with above diagnosis.  Pt  Motivated to mobilize, reports independence at his baseline.  Unsteady with gait however tolerated hallway ambulation, SpO2=88% at  Rest on 4L, 82% during amb, returned to 89-90% with rest and concentrated inhalation/exhalation. Will continue to follow in acute setting. Likely will not need f/u PT. Wife present and is very supportive.   Pt currently with functional limitations due to the deficits listed below (see PT Problem List). Pt will benefit from skilled PT to increase their independence and safety with mobility to allow discharge to the venue listed below.       Follow Up Recommendations No PT follow up;Supervision - Intermittent    Equipment Recommendations  None recommended by PT    Recommendations for Other Services       Precautions / Restrictions Precautions Precautions: Fall Restrictions Weight Bearing Restrictions: No      Mobility  Bed Mobility   Bed Mobility: Supine to Sit     Supine to sit: Supervision     General bed mobility comments: for lines and safety, tangled in O2 tubing on initial sit  Transfers Overall transfer level: Needs assistance Equipment used: Rolling walker (2 wheeled) Transfers: Sit to/from Stand Sit to Stand: Min guard         General transfer comment: for safety  Ambulation/Gait Ambulation/Gait assistance: Min guard;Min assist Gait Distance (Feet): 160 Feet Assistive device: IV Pole Gait Pattern/deviations: Step-through pattern;Decreased stride length;Wide base of support;Drifts right/left     General  Gait Details: min to min/guard for safety and balance.  unsteady, some assist needed to maneuver around obstacles in hallway  Stairs            Wheelchair Mobility    Modified Rankin (Stroke Patients Only)       Balance Overall balance assessment: Needs assistance Sitting-balance support: No upper extremity supported;Feet supported Sitting balance-Leahy Scale: Good       Standing balance-Leahy Scale: Fair                               Pertinent Vitals/Pain Pain Assessment: Faces Faces Pain Scale: Hurts a little bit Pain Location: c/o pain in chest d/t reflux Pain Descriptors / Indicators: Burning Pain Intervention(s): Limited activity within patient's tolerance;Monitored during session;Repositioned    Home Living Family/patient expects to be discharged to:: Private residence Living Arrangements: Spouse/significant other Available Help at Discharge: Family Type of Home: House Home Access: Stairs to enter   [employer] of Steps: "  a few" Home Layout: Two level;Able to live on main level with bedroom/bathroom Home Equipment: Gilford Rile - 2 wheels;Cane - single point;Wheelchair - power      Prior Function Level of Independence: Independent               Hand Dominance        Extremity/Trunk Assessment   Upper Extremity Assessment Upper Extremity Assessment: Overall WFL for tasks assessed    Lower Extremity Assessment Lower Extremity Assessment: Overall WFL for tasks assessed;RLE deficits/detail RLE Deficits / Details: grossly at least 3+/5 bil LEs       Communication  Communication: No difficulties  Cognition Arousal/Alertness: Awake/alert Behavior During Therapy: WFL for tasks assessed/performed Overall Cognitive Status: Within Functional Limits for tasks assessed                                        General Comments      Exercises     Assessment/Plan    PT Assessment Patient needs continued PT  services  PT Problem List Decreased mobility;Decreased activity tolerance;Decreased knowledge of use of DME;Decreased balance;Cardiopulmonary status limiting activity       PT Treatment Interventions DME instruction;Therapeutic exercise;Gait training;Functional mobility training;Therapeutic activities;Patient/family education;Balance training    PT Goals (Current goals can be found in the Care Plan section)  Acute Rehab PT Goals Patient Stated Goal: go home PT Goal Formulation: With patient Time For Goal Achievement: 12/15/19 Potential to Achieve Goals: Good    Frequency Min 3X/week   Barriers to discharge        Co-evaluation               AM-PAC PT "6 Clicks" Mobility  Outcome Measure Help needed turning from your back to your side while in a flat bed without using bedrails?: None Help needed moving from lying on your back to sitting on the side of a flat bed without using bedrails?: None Help needed moving to and from a bed to a chair (including a wheelchair)?: A Little Help needed standing up from a chair using your arms (e.g., wheelchair or bedside chair)?: A Little Help needed to walk in hospital room?: A Little Help needed climbing 3-5 steps with a railing? : A Little 6 Click Score: 20    End of Session Equipment Utilized During Treatment: Gait belt Activity Tolerance: Patient tolerated treatment well Patient left: with call bell/phone within reach;in chair;with chair alarm set;with family/visitor present   PT Visit Diagnosis: Unsteadiness on feet (R26.81)    Time: 5027-7412 PT Time Calculation (min) (ACUTE ONLY): 24 min   Charges:   PT Evaluation $PT Eval Low Complexity: 1 Low PT Treatments $Gait Training: 8-22 mins        Baxter Flattery, PT  Acute Rehab Dept ([place]) [phone] Pager [phone]  12/01/2019   [facility] 12/01/2019, 4:25 PM

## 2019-12-01 NOTE — Progress Notes (Signed)
Ricky Conway 11:23 AM  Subjective: Patient doing well this morning after a bowel movement and we discussed MiraLAX and we also rediscussed ERCP and answered all of his and his wife's questions  Objective: Vital signs stable afebrile no acute distress abdomen is soft nontender white count still 17 LFTs slight decrease  Assessment: Possible CBD obstruction from stent occlusion  Plan: The risk benefits methods and success rate of repeat ERCP was discussed with the patient and his wife and will proceed tomorrow morning and will allow to eat today but n.p.o. after midnight except meds with sips tomorrow  [facility]  office [phone] After 5PM or if no answer call [phone]

## 2019-12-01 NOTE — Progress Notes (Signed)
Pharmacy Antibiotic Note  Ricky Conway is a 74 y.o. male admitted on 11/30/2019 with intra-abdominal infection.  Pharmacy has been consulted for Zosyn dosing.  Plan: Zosyn 3.375g IV q8h (4 hour infusion).  Monitor clinical course, renal function, cultures as available   Height: 5\' 11"  (180.3 cm) Weight: 75.5 kg (166 lb 7.2 oz) IBW/kg (Calculated) : 75.3  Temp (24hrs), Avg:98.6 F (37 C), Min:98.1 F (36.7 C), Max:99.1 F (37.3 C)  Recent Labs  Lab 11/26/19 0945 11/30/19 1037 12/01/19 0617  WBC 11.0* 17.1* 17.4*  CREATININE 0.73 0.67 0.49*    Estimated Creatinine Clearance: 85 mL/min (A) (by C-G formula based on SCr of 0.49 mg/dL (L)).    No Known Allergies    Thank you for allowing pharmacy to be a part of this patient's care.  Royetta Asal, PharmD, BCPS 12/01/2019 4:07 PM

## 2019-12-02 ENCOUNTER — Inpatient Hospital Stay (HOSPITAL_COMMUNITY): Payer: Medicare Other | Admitting: Anesthesiology

## 2019-12-02 ENCOUNTER — Inpatient Hospital Stay (HOSPITAL_COMMUNITY): Payer: Medicare Other

## 2019-12-02 ENCOUNTER — Encounter (HOSPITAL_COMMUNITY): Payer: Self-pay | Admitting: Internal Medicine

## 2019-12-02 ENCOUNTER — Encounter (HOSPITAL_COMMUNITY)
Admission: AD | Disposition: A | Discharge status: Home / Self Care | Payer: Self-pay | Source: Ambulatory Visit | Attending: Internal Medicine

## 2019-12-02 DIAGNOSIS — C3490 Malignant neoplasm of unspecified part of unspecified bronchus or lung: Secondary | ICD-10-CM

## 2019-12-02 DIAGNOSIS — R160 Hepatomegaly, not elsewhere classified: Secondary | ICD-10-CM

## 2019-12-02 DIAGNOSIS — R17 Unspecified jaundice: Secondary | ICD-10-CM

## 2019-12-02 HISTORY — PX: BILIARY STENT PLACEMENT: SHX5538

## 2019-12-02 HISTORY — PX: ENDOSCOPIC RETROGRADE CHOLANGIOPANCREATOGRAPHY (ERCP) WITH PROPOFOL: SHX5810

## 2019-12-02 HISTORY — PX: REMOVAL OF STONES: SHX5545

## 2019-12-02 LAB — CBC
HCT: 41.4 % (ref 39.0–52.0)
Hemoglobin: 13.9 g/dL (ref 13.0–17.0)
MCH: 34 pg (ref 26.0–34.0)
MCHC: 33.6 g/dL (ref 30.0–36.0)
MCV: 101.2 fL — ABNORMAL HIGH (ref 80.0–100.0)
Platelets: 314 10*3/uL (ref 150–400)
RBC: 4.09 MIL/uL — ABNORMAL LOW (ref 4.22–5.81)
RDW: 15.5 % (ref 11.5–15.5)
WBC: 12 10*3/uL — ABNORMAL HIGH (ref 4.0–10.5)
nRBC: 0 % (ref 0.0–0.2)

## 2019-12-02 LAB — COMPREHENSIVE METABOLIC PANEL
ALT: 172 U/L — ABNORMAL HIGH (ref 0–44)
AST: 75 U/L — ABNORMAL HIGH (ref 15–41)
Albumin: 2.6 g/dL — ABNORMAL LOW (ref 3.5–5.0)
Alkaline Phosphatase: 309 U/L — ABNORMAL HIGH (ref 38–126)
Anion gap: 11 (ref 5–15)
BUN: 13 mg/dL (ref 8–23)
CO2: 28 mmol/L (ref 22–32)
Calcium: 8.6 mg/dL — ABNORMAL LOW (ref 8.9–10.3)
Chloride: 93 mmol/L — ABNORMAL LOW (ref 98–111)
Creatinine, Ser: 0.48 mg/dL — ABNORMAL LOW (ref 0.61–1.24)
GFR calc Af Amer: >60 mL/min (ref >60)
GFR calc non Af Amer: >60 mL/min (ref >60)
Glucose, Bld: 86 mg/dL (ref 70–99)
Potassium: 3.5 mmol/L (ref 3.5–5.1)
Sodium: 132 mmol/L — ABNORMAL LOW (ref 135–145)
Total Bilirubin: 5.1 mg/dL — ABNORMAL HIGH (ref 0.3–1.2)
Total Protein: 5.9 g/dL — ABNORMAL LOW (ref 6.5–8.1)

## 2019-12-02 SURGERY — ENDOSCOPIC RETROGRADE CHOLANGIOPANCREATOGRAPHY (ERCP) WITH PROPOFOL
Anesthesia: General

## 2019-12-02 MED ORDER — SODIUM CHLORIDE 0.9 % IV SOLN
INTRAVENOUS | Status: DC | PRN
  Administered 2019-12-02: 33 mL

## 2019-12-02 MED ORDER — GLUCAGON HCL RDNA (DIAGNOSTIC) 1 MG IJ SOLR
INTRAMUSCULAR | Status: AC
  Filled 2019-12-02: qty 1

## 2019-12-02 MED ORDER — PHENYLEPHRINE HCL-NACL 10-0.9 MG/250ML-% IV SOLN
INTRAVENOUS | Status: DC | PRN
Start: 2019-12-02 — End: 2019-12-02
  Administered 2019-12-02: 40 ug/min via INTRAVENOUS

## 2019-12-02 MED ORDER — NICOTINE 14 MG/24HR TD PT24
14.0000 mg | MEDICATED_PATCH | Freq: Every day | TRANSDERMAL | Status: DC
  Administered 2019-12-02 – 2019-12-03 (×2): 14 mg via TRANSDERMAL
  Filled 2019-12-02 (×2): qty 1

## 2019-12-02 MED ORDER — FENTANYL CITRATE (PF) 100 MCG/2ML IJ SOLN
INTRAMUSCULAR | Status: AC
  Filled 2019-12-02: qty 2

## 2019-12-02 MED ORDER — DEXAMETHASONE SODIUM PHOSPHATE 10 MG/ML IJ SOLN
INTRAMUSCULAR | Status: DC | PRN
  Administered 2019-12-02: 8 mg via INTRAVENOUS

## 2019-12-02 MED ORDER — SODIUM CHLORIDE 0.9 % IV SOLN: INTRAVENOUS | Status: DC

## 2019-12-02 MED ORDER — INDOMETHACIN 50 MG RE SUPP
RECTAL | Status: AC
  Filled 2019-12-02: qty 2

## 2019-12-02 MED ORDER — ROCURONIUM BROMIDE 10 MG/ML (PF) SYRINGE
PREFILLED_SYRINGE | INTRAVENOUS | Status: DC | PRN
  Administered 2019-12-02: 50 mg via INTRAVENOUS

## 2019-12-02 MED ORDER — PROPOFOL 10 MG/ML IV BOLUS
INTRAVENOUS | Status: AC
  Filled 2019-12-02: qty 20

## 2019-12-02 MED ORDER — SUGAMMADEX SODIUM 200 MG/2ML IV SOLN
INTRAVENOUS | Status: DC | PRN
  Administered 2019-12-02: 151 mg via INTRAVENOUS

## 2019-12-02 MED ORDER — PROPOFOL 10 MG/ML IV BOLUS
INTRAVENOUS | Status: DC | PRN
  Administered 2019-12-02: 180 mg via INTRAVENOUS

## 2019-12-02 MED ORDER — LACTATED RINGERS IV SOLN: INTRAVENOUS | Status: DC

## 2019-12-02 MED ORDER — PHENYLEPHRINE 40 MCG/ML (10ML) SYRINGE FOR IV PUSH (FOR BLOOD PRESSURE SUPPORT)
PREFILLED_SYRINGE | INTRAVENOUS | Status: DC | PRN
  Administered 2019-12-02: 80 ug via INTRAVENOUS

## 2019-12-02 MED ORDER — ONDANSETRON HCL 4 MG/2ML IJ SOLN
INTRAMUSCULAR | Status: DC | PRN
  Administered 2019-12-02: 4 mg via INTRAVENOUS

## 2019-12-02 NOTE — Anesthesia Procedure Notes (Signed)
Procedure Name: Intubation Date/Time: 12/02/2019 8:49 AM Performed by: Anne Fu, CRNA Pre-anesthesia Checklist: Patient identified, Emergency Drugs available, Suction available, Patient being monitored and Timeout performed Patient Re-evaluated:Patient Re-evaluated prior to induction Oxygen Delivery Method: Circle system utilized Preoxygenation: Pre-oxygenation with 100% oxygen Induction Type: IV induction Ventilation: Mask ventilation without difficulty Laryngoscope Size: Mac and 4 Grade View: Grade I Tube type: Oral Tube size: 7.5 mm Number of attempts: 1 Airway Equipment and Method: Stylet Placement Confirmation: ETT inserted through vocal cords under direct vision,  positive ETCO2 and breath sounds checked- equal and bilateral Secured at: 22 cm Tube secured with: Tape Dental Injury: Teeth and Oropharynx as per pre-operative assessment

## 2019-12-02 NOTE — Progress Notes (Signed)
PROGRESS NOTE    Ricky Conway  GNF:[id number] DOB: 1945/09/27 DOA: 11/30/2019 PCP: Mayra Neer, MD   Brief Narrative:   Ricky Saxon Horneris a 74 y.o.malewith medical history significant ofmetastatic lung cancer with liver mets and CBD obstruction admitted with generalized weakness decreased appetite decreased p.o. intake constipation and abdominal pain. Patient denied any fever chills or cough. He has chronic shortness of breath. He denied any nausea vomiting or diarrhea. He denied any urinary complaints. Patient was a direct admit from cancer center due to concern for CBD obstruction with elevated LFTs. Patient had biliary duct stent placed in March 2020 by Dr. Therisa Doyne. Patient is on oxygen at home 24/7 He is followed by Dr. Benay Spice and is on chemo last dose of chemo was 11/12/2019 the next 1 is scheduled for 12/04/2019. He is on Gemzar. Dr. Benay Spice has contacted GI to see the patient while in hospital. He has been constipated for 5 days in spite of taking Dulcolax and and an enema at home. KUB obtained at the cancer center showed constipation with no evidence of obstruction. Patient complains of generalized abdominal pain and distention. His bilirubin today is 8.5 which was normal on 11/26/2019.  6/9: He is now s/p ERCP. Partial obstruction of existing stent was cleared by balloon sweep. Additional stent placed for stricture noted. Started on CLD and advance this evening if tolerating. Follow AM labs.     Assessment & Plan:   Active Problems:   Liver masses   Common biliary duct obstruction   Hypertension   Hyperlipidemia   COPD, mild (HCC)   Malnutrition of moderate degree   Lung cancer (HCC)   Jaundice   Common bile duct (CBD) obstruction   Liver mass   Leukocytosis  jaundice likely secondary to CBD obstruction in the setting of metastatic lung malignancy with mets to the liver.     - Patient with CBD stent placed in March 2020 by Dr. Therisa Doyne. GI consulted appreciate  their input.  Patient to go to the OR today     - Bilirubin 7.4 from 8.5 up from 0.7 on 11/26/2019     - 6/9: s/p ERCP w/ existing stent partial obstruction cleared by balloon sweep and additional stent placed in new stricture; started CLD and can advance this evening. Follow up AM labs.  constipation     - resolved on MiraLAX Dulcolax and Colace.    leukocytosis     - chest x-ray negative, UA ordered not done.     - 6/9: WBC down to 12; no fevers, vital stable, monitor    hyponatremia likely multifactorial      - patient does appear dehydrated      - hold Lasix and start normal saline     - 6/9: Na+ up 132  Metastatic lung malignancy with mets to the liver     - followed by Dr. Benay Spice     - continue outpt follow up  hypertension      - continue metoprolol, lisinopril  hypothyroidism      - continue Synthroid  goals of care      - discussed with patient and wife in the room patient wishes to be DO NOT RESUSCITATE  DVT prophylaxis: SCDs Code Status: DNR Family Communication: Spoke with wife at bedside   Status is: Inpatient  Remains inpatient appropriate because:ongiong need for monitoring.    Dispo: The patient is from: Home  Anticipated d/c is to: Home              Anticipated d/c date is: 1 day              Patient currently is not medically stable to d/c.  Consultants:   GI  Procedures:   ERCP  Antimicrobials:  . zosyn   ROS:  Reports abdominal pain, drowsiness . Remainder 10-pt ROS is negative for all not previously mentioned.  Subjective: "I'm still a little groggy."  Objective: Vitals:   12/02/19 1000 12/02/19 1010 12/02/19 1029 12/02/19 1306  BP: (!) 157/86 (!) 154/81 (!) 150/73 126/62  Pulse: 73 70 68 65  Resp: 15 18 15 15   Temp:   97.8 F (36.6 C) 98.5 F (36.9 C)  TempSrc:   Oral Oral  SpO2: 95% 95% 93% 95%  Weight:      Height:        Intake/Output Summary (Last 24 hours) at 12/02/2019 1806 Last data filed at  12/02/2019 1500 Gross per 24 hour  Intake 400 ml  Output 355 ml  Net 45 ml   Filed Weights   11/30/19 1732  Weight: 75.5 kg    Examination:  General: 74 y.o. male resting in bed in NAD Cardiovascular: RRR, +S1, S2, no m/g/r, equal pulses throughout Respiratory: CTABL, no w/r/r, normal WOB GI: BS+, ND, mild TTP RUQ no masses noted, no organomegaly noted MSK: No e/c/c Neuro: A&O x 3, no focal deficits   Data Reviewed: I have personally reviewed following labs and imaging studies.  CBC: Recent Labs  Lab 11/26/19 0945 11/30/19 1037 12/01/19 0617 12/02/19 0509  WBC 11.0* 17.1* 17.4* 12.0*  NEUTROABS 8.5* 14.4*  --   --   HGB 16.0 16.8 14.5 13.9  HCT 47.0 47.4 42.4 41.4  MCV 101.1* 97.9 100.0 101.2*  PLT 265 347 320 572   Basic Metabolic Panel: Recent Labs  Lab 11/26/19 0945 11/30/19 1037 12/01/19 0617 12/02/19 0509  NA 135 130* 130* 132*  K 3.8 3.6 3.6 3.5  CL 97* 88* 92* 93*  CO2 27 28 27 28   GLUCOSE 119* 117* 114* 86  BUN 8 9 13 13   CREATININE 0.73 0.67 0.49* 0.48*  CALCIUM 9.1 10.1 8.7* 8.6*   GFR: Estimated Creatinine Clearance: 85 mL/min (A) (by C-G formula based on SCr of 0.48 mg/dL (L)). Liver Function Tests: Recent Labs  Lab 11/26/19 0945 11/30/19 1037 12/01/19 0617 12/02/19 0509  AST 239* 185* 116* 75*  ALT 191* 384* 237* 172*  ALKPHOS 225* 539* 366* 309*  BILITOT 0.7 8.5* 7.4* 5.1*  PROT 6.9 7.3 6.1* 5.9*  ALBUMIN 3.2* 3.0* 2.7* 2.6*   No results for input(s): LIPASE, AMYLASE in the last 168 hours. No results for input(s): AMMONIA in the last 168 hours. Coagulation Profile: No results for input(s): INR, PROTIME in the last 168 hours. Cardiac Enzymes: No results for input(s): CKTOTAL, CKMB, CKMBINDEX, TROPONINI in the last 168 hours. BNP (last 3 results) No results for input(s): PROBNP in the last 8760 hours. HbA1C: No results for input(s): HGBA1C in the last 72 hours. CBG: No results for input(s): GLUCAP in the last 168 hours. Lipid  Profile: No results for input(s): CHOL, HDL, LDLCALC, TRIG, CHOLHDL, LDLDIRECT in the last 72 hours. Thyroid Function Tests: No results for input(s): TSH, T4TOTAL, FREET4, T3FREE, THYROIDAB in the last 72 hours. Anemia Panel: No results for input(s): VITAMINB12, FOLATE, FERRITIN, TIBC, IRON, RETICCTPCT in the last 72 hours. Sepsis Labs: No results for input(s):  PROCALCITON, LATICACIDVEN in the last 168 hours.  Recent Results (from the past 240 hour(s))  SARS Coronavirus 2 by RT PCR (hospital order, performed in [facility] hospital lab) Nasopharyngeal Nasopharyngeal Swab     Status: None   Collection Time: 12/01/19  2:18 PM   Specimen: Nasopharyngeal Swab  Result Value Ref Range Status   SARS Coronavirus 2 NEGATIVE NEGATIVE Final    Comment: (NOTE) SARS-CoV-2 target nucleic acids are NOT DETECTED. The SARS-CoV-2 RNA is generally detectable in upper and lower respiratory specimens during the acute phase of infection. The lowest concentration of SARS-CoV-2 viral copies this assay can detect is 250 copies / mL. A negative result does not preclude SARS-CoV-2 infection and should not be used as the sole basis for treatment or other patient management decisions.  A negative result may occur with improper specimen collection / handling, submission of specimen other than nasopharyngeal swab, presence of viral mutation(s) within the areas targeted by this assay, and inadequate number of viral copies (<250 copies / mL). A negative result must be combined with clinical observations, patient history, and epidemiological information. Fact Sheet for Patients:   StrictlyIdeas.no Fact Sheet for Healthcare Providers: BankingDealers.co.za This test is not yet approved or cleared  by the [place] FDA and has been authorized for detection and/or diagnosis of SARS-CoV-2 by FDA under an Emergency Use Authorization (EUA).  This EUA will remain in effect  (meaning this test can be used) for the duration of the COVID-19 declaration under Section 564(b)(1) of the Act, 21 U.S.C. section 360bbb-3(b)(1), unless the authorization is terminated or revoked sooner. Performed at [facility], [place] [address] [place], [place] [address]       Radiology Studies: DG Chest 1 View  Result Date: 11/30/2019 CLINICAL DATA:  History of lung carcinoma.  Hypertension. EXAM: CHEST  1 VIEW COMPARISON:  Chest CT Nov 19, 2019 FINDINGS: Underlying emphysematous change noted. Scarring is present in the lung bases. There is no edema or airspace opacity. Heart size is within normal limits. Pulmonary vascularity reflects a underlying emphysematous change with decreased vascularity right upper lobe. No adenopathy is appreciable by radiography. Port-A-Cath tip is in the superior vena cava. No bone lesions. There is aortic atherosclerosis. IMPRESSION: Underlying emphysematous change with areas of scarring. No edema or airspace opacity. Heart size within normal limits. Port-A-Cath tip in superior vena cava. Aortic Atherosclerosis (ICD10-I70.0). Electronically Signed   By: Lowella Grip III M.D.   On: 11/30/2019 19:56   DG ERCP  Result Date: 12/02/2019 CLINICAL DATA:  Obstructive jaundice. EXAM: ERCP TECHNIQUE: Multiple spot images obtained with the fluoroscopic device and submitted for interpretation post-procedure. COMPARISON:  CT abdomen pelvis-11/19/2018; ERCP-09/09/2018 FLUOROSCOPY TIME:  5 minutes, 30 seconds FINDINGS: 17 spot intraoperative fluoroscopic images of the right upper abdominal quadrant during ERCP are provided for review. Initial image demonstrates an ERCP probe overlying the right upper abdominal quadrant. A internal metallic biliary stent overlies expected location of the CBD. Subsequent images demonstrate selective cannulation and opacification of the biliary stent with nonocclusive filling defects seen within its mid and distal aspects.  Subsequent images demonstrate a moderate to long segment narrowing/subtotal occlusion at the level of the biliary hilum, superior to the biliary stent. Subsequent images demonstrate placement of an additional overlapping internal biliary stent which extends to the central aspect of the left intrahepatic biliary tree. There is minimal opacification of the intrahepatic biliary tree which appears mildly dilated. There is no opacification of either the cystic or pancreatic ducts.  IMPRESSION: ERCP with placement of an additional overlapping internal biliary stent extending to the level of the biliary hilum and central aspect of the left biliary tree. These images were submitted for radiologic interpretation only. Please see the procedural report for the amount of contrast and the fluoroscopy time utilized. Electronically Signed   By: Sandi Mariscal M.D.   On: 12/02/2019 11:25     Scheduled Meds: . bisacodyl  10 mg Oral Daily  . Chlorhexidine Gluconate Cloth  6 each Topical Daily  . docusate sodium  200 mg Oral Daily  . levothyroxine  50 mcg Oral Q0600  . lisinopril  40 mg Oral Daily  . metoprolol tartrate  12.5 mg Oral BID  . nicotine  14 mg Transdermal Daily  . polyethylene glycol  17 g Oral Daily   Continuous Infusions: . sodium chloride 75 mL/hr at 12/02/19 0510  . piperacillin-tazobactam (ZOSYN)  IV 3.375 g (12/02/19 1748)     LOS: 2 days    Time spent: 25 minutes spent in the coordination of care today.    Jonnie Finner, DO [facility]  If 7PM-7AM, please contact night-coverage [email or web address] 12/02/2019, 6:06 PM

## 2019-12-02 NOTE — Anesthesia Postprocedure Evaluation (Signed)
Anesthesia Post Note  Patient: Ricky Conway  Procedure(s) Performed: ENDOSCOPIC RETROGRADE CHOLANGIOPANCREATOGRAPHY (ERCP) WITH PROPOFOL (N/A ) BILIARY STENT PLACEMENT (N/A )     Anesthesia Type: General    Last Vitals:  Vitals:   12/02/19 1010 12/02/19 1029  BP: (!) 154/81 (!) 150/73  Pulse: 70 68  Resp: 18 15  Temp:  36.6 C  SpO2: 95% 93%    Last Pain:  Vitals:   12/02/19 1029  TempSrc: Oral  PainSc:                  Maribel Luis

## 2019-12-02 NOTE — Progress Notes (Signed)
Patient's port was already accessed prior to arrival in endoscopy

## 2019-12-02 NOTE — Progress Notes (Signed)
Ricky Conway 8:37 AM  Subjective: Patient without any new complaints is moving his bowels pain is better and we rediscussed the procedure  Objective: Signs stable afebrile no acute distress exam please see preassessment evaluation LFTs slight decreased white count decreased  Assessment: Metastatic cancer with possible stent obstruction  Plan: Okay to proceed with ERCP with anesthesia assistance  [facility]  office [phone] After 5PM or if no answer call [phone]

## 2019-12-02 NOTE — Op Note (Signed)
[facility] Patient Name: Ricky Conway Procedure Date: 12/02/2019 MRN: [id number] Attending MD: Clarene Essex , MD Date of Birth: Jun 12, 1946 CSN: [id number] Age: 74 Admit Type: Inpatient Procedure:                ERCP Indications:              Abnormal abdominal CT, Jaundice concerns regarding                            stent occlusion Providers:                Clarene Essex, MD, Cleda Daub, RN, Theodora Blow,                            Technician Referring MD:              Medicines:                General Anesthesia Complications:            No immediate complications. Estimated Blood Loss:     Estimated blood loss: none. Procedure:                Pre-Anesthesia Assessment:                           - Prior to the procedure, a History and Physical                            was performed, and patient medications and                            allergies were reviewed. The patient's tolerance of                            previous anesthesia was also reviewed. The risks                            and benefits of the procedure and the sedation                            options and risks were discussed with the patient.                            All questions were answered, and informed consent                            was obtained. Prior Anticoagulants: The patient has                            taken no previous anticoagulant or antiplatelet                            agents. ASA Grade Assessment: III - A patient with                            severe systemic disease. After reviewing  the risks                            and benefits, the patient was deemed in                            satisfactory condition to undergo the procedure.                           After obtaining informed consent, the scope was                            passed under direct vision. Throughout the                            procedure, the patient's blood pressure, pulse, and                             oxygen saturations were monitored continuously. The                            [id number]-Q180V ([id number] Olympus Doudenoscope was                            introduced through the mouth, and used to inject                            contrast into and used to cannulate the bile duct.                            The ERCP was accomplished without difficulty. The                            patient tolerated the procedure well. Scope In: Scope Out: Findings:      One covered metal stent originating in the biliary tree was barely       emerging from the major papilla with only 2 portions of the cages being       seen. The stent was partially occluded. Deep selective cannulation was       readily obtained using the sphincterotome loaded with the JAG wire and       the wire was advanced into the intrahepatics and on initial       cholangiogram there was a moderate amount of debris in the stent and       there was an obvious 3 to 4 cm stricture above the stent the biliary       tree was swept with a 12 mm balloon starting at the upper third of the       main bile duct. Sludge and stones was swept from the duct. Multiple       balloon pull-through's was done all stones were removed. Nothing was       found on occlusion cholangiogram which was done at the top of the stent       which confirmed the dominant stricture above it as well as complete       lower stent patency. And  we elected to place one 10 Fr by 8 cm uncovered       metal stent with no external flaps and no internal flaps was placed 7.5       cm into the common bile duct. Bile flowed through the stent. The stent       was in good position it transversed not only the proximal stricture but       the entire stent as well. And there was excellent biliary drainage at       the end of the procedure and no pancreatic duct injection or wire       advancement was done throughout the procedure Impression:               - One partially  occluded stent from the biliary                            tree was seen in the major papilla. It was                            essentially completely inside the ampulla-                            Choledocholithiasis and sludge was found. Complete                            removal was accomplished by balloon extraction.                           - The biliary tree was swept and nothing was found                            in the stent at the end of the procedure. But there                            was an obvious dominant stricture above the stent                           - One uncovered metal stent was placed into the                            common bile duct above the stricture and                            transversing the previous place stent which based                            on length of time it has been in place and its                            embedded nature we elected not to remove Moderate Sedation:      Not Applicable - Patient had care per Anesthesia. Recommendation:           - Clear liquid diet for 6 hours. Then may have soft  solids and consider discharge later tonight after                            dinner or tomorrow if doing well                           - Continue present medications.                           - Return to GI clinic PRN.                           - Telephone GI clinic if symptomatic PRN.                           - Check liver enzymes (AST, ALT, alkaline                            phosphatase, bilirubin) tomorrow or if he goes home                            early next week on oncology follow-up. Procedure Code(s):        --- Professional ---                           [phone], Endoscopic retrograde                            cholangiopancreatography (ERCP); with removal and                            exchange of stent(s), biliary or pancreatic duct,                            including pre- and post-dilation and  guide wire                            passage, when performed, including sphincterotomy,                            when performed, each stent exchanged                           [id number], Endoscopic retrograde                            cholangiopancreatography (ERCP); with removal of                            calculi/debris from biliary/pancreatic duct(s) Diagnosis Code(s):        --- Professional ---                           J68.115B, Other mechanical complication of bile  duct prosthesis, initial encounter                           K80.50, Calculus of bile duct without cholangitis                            or cholecystitis without obstruction                           R17, Unspecified jaundice                           R93.5, Abnormal findings on diagnostic imaging of                            other abdominal regions, including retroperitoneum CPT copyright 2019 American Medical Association. All rights reserved. The codes documented in this report are preliminary and upon coder review may  be revised to meet current compliance requirements. Clarene Essex, MD 12/02/2019 9:47:50 AM This report has been signed electronically. Number of Addenda: 0

## 2019-12-02 NOTE — Anesthesia Preprocedure Evaluation (Addendum)
Anesthesia Evaluation  Patient identified by MRN, date of birth, ID band Patient awake    Reviewed: Allergy & Precautions, H&P , NPO status , Patient's Chart, lab work & pertinent test results, reviewed documented beta blocker date and time   History of Anesthesia Complications Negative for: history of anesthetic complications  Airway Mallampati: II  TM Distance: >3 FB Neck ROM: Full    Dental no notable dental hx. (+) Edentulous Upper, Edentulous Lower   Pulmonary shortness of breath and with exertion, COPD,  COPD inhaler, Current Smoker,    Pulmonary exam normal breath sounds clear to auscultation       Cardiovascular hypertension, Pt. on medications negative cardio ROS Normal cardiovascular exam Rhythm:Regular Rate:Normal     Neuro/Psych negative neurological ROS  negative psych ROS   GI/Hepatic negative GI ROS, Neg liver ROS, CBD OBSTRUCTION, OBSTRUCTIVE JAUNDICE   Endo/Other  Hypothyroidism   Renal/GU negative Renal ROS  negative genitourinary   Musculoskeletal negative musculoskeletal ROS (+)   Abdominal   Peds  Hematology negative hematology ROS (+)   Anesthesia Other Findings history of metastatic lung cancer with liver metastasis s/p ERCP 09/09/2019 with placement of covered metal CBD stent presenting with hyperbilirubinemia and suspected occlusion of CBD stent.  Reproductive/Obstetrics                           Anesthesia Physical  Anesthesia Plan  ASA: IV  Anesthesia Plan: General   Post-op Pain Management:    Induction: Intravenous  PONV Risk Score and Plan: 2 and Ondansetron and Dexamethasone  Airway Management Planned: Oral ETT  Additional Equipment: None  Intra-op Plan:   Post-operative Plan: Extubation in OR  Informed Consent: I have reviewed the patients History and Physical, chart, labs and discussed the procedure including the risks, benefits and  alternatives for the proposed anesthesia with the patient or authorized representative who has indicated his/her understanding and acceptance.     Dental advisory given  Plan Discussed with: Surgeon, CRNA and Anesthesiologist  Anesthesia Plan Comments:        Anesthesia Quick Evaluation

## 2019-12-02 NOTE — Transfer of Care (Signed)
Immediate Anesthesia Transfer of Care Note  Patient: Ricky Conway  Procedure(s) Performed: Procedure(s): ENDOSCOPIC RETROGRADE CHOLANGIOPANCREATOGRAPHY (ERCP) WITH PROPOFOL (N/A) BILIARY STENT PLACEMENT (N/A)  Patient Location: PACU  Anesthesia Type:General  Level of Consciousness:  sedated, patient cooperative and responds to stimulation  Airway & Oxygen Therapy:Patient Spontanous Breathing and Patient connected to face mask oxgen  Post-op Assessment:  Report given to PACU RN and Post -op Vital signs reviewed and stable  Post vital signs:  Reviewed and stable  Last Vitals:  Vitals:   12/02/19 0802 12/02/19 0946  BP: 140/78 (!) 146/73  Pulse: 69 85  Resp: 13 11  Temp: 37.1 C 36.9 C  SpO2: 90% 12%    Complications: No apparent anesthesia complications

## 2019-12-03 ENCOUNTER — Encounter (HOSPITAL_COMMUNITY): Payer: Self-pay | Admitting: Gastroenterology

## 2019-12-03 DIAGNOSIS — I10 Essential (primary) hypertension: Secondary | ICD-10-CM

## 2019-12-03 DIAGNOSIS — D72829 Elevated white blood cell count, unspecified: Secondary | ICD-10-CM

## 2019-12-03 DIAGNOSIS — E440 Moderate protein-calorie malnutrition: Secondary | ICD-10-CM

## 2019-12-03 LAB — CBC WITH DIFFERENTIAL/PLATELET
Abs Immature Granulocytes: 0.14 10*3/uL — ABNORMAL HIGH (ref 0.00–0.07)
Basophils Absolute: 0.1 10*3/uL (ref 0.0–0.1)
Basophils Relative: 0 %
Eosinophils Absolute: 0.1 10*3/uL (ref 0.0–0.5)
Eosinophils Relative: 1 %
HCT: 40.5 % (ref 39.0–52.0)
Hemoglobin: 13.8 g/dL (ref 13.0–17.0)
Immature Granulocytes: 1 %
Lymphocytes Relative: 6 %
Lymphs Abs: 0.8 10*3/uL (ref 0.7–4.0)
MCH: 34.8 pg — ABNORMAL HIGH (ref 26.0–34.0)
MCHC: 34.1 g/dL (ref 30.0–36.0)
MCV: 102.3 fL — ABNORMAL HIGH (ref 80.0–100.0)
Monocytes Absolute: 2.2 10*3/uL — ABNORMAL HIGH (ref 0.1–1.0)
Monocytes Relative: 17 %
Neutro Abs: 9.8 10*3/uL — ABNORMAL HIGH (ref 1.7–7.7)
Neutrophils Relative %: 75 %
Platelets: 346 10*3/uL (ref 150–400)
RBC: 3.96 MIL/uL — ABNORMAL LOW (ref 4.22–5.81)
RDW: 15.4 % (ref 11.5–15.5)
WBC: 13.1 10*3/uL — ABNORMAL HIGH (ref 4.0–10.5)
nRBC: 0 % (ref 0.0–0.2)

## 2019-12-03 LAB — COMPREHENSIVE METABOLIC PANEL
ALT: 131 U/L — ABNORMAL HIGH (ref 0–44)
AST: 51 U/L — ABNORMAL HIGH (ref 15–41)
Albumin: 2.7 g/dL — ABNORMAL LOW (ref 3.5–5.0)
Alkaline Phosphatase: 282 U/L — ABNORMAL HIGH (ref 38–126)
Anion gap: 8 (ref 5–15)
BUN: 12 mg/dL (ref 8–23)
CO2: 32 mmol/L (ref 22–32)
Calcium: 8.6 mg/dL — ABNORMAL LOW (ref 8.9–10.3)
Chloride: 96 mmol/L — ABNORMAL LOW (ref 98–111)
Creatinine, Ser: 0.48 mg/dL — ABNORMAL LOW (ref 0.61–1.24)
GFR calc Af Amer: >60 mL/min (ref >60)
GFR calc non Af Amer: >60 mL/min (ref >60)
Glucose, Bld: 94 mg/dL (ref 70–99)
Potassium: 3.8 mmol/L (ref 3.5–5.1)
Sodium: 136 mmol/L (ref 135–145)
Total Bilirubin: 2.8 mg/dL — ABNORMAL HIGH (ref 0.3–1.2)
Total Protein: 6.1 g/dL — ABNORMAL LOW (ref 6.5–8.1)

## 2019-12-03 MED ORDER — METOPROLOL TARTRATE 25 MG PO TABS: 12.5000 mg | ORAL_TABLET | Freq: Two times a day (BID) | ORAL | 0 refills | Status: DC

## 2019-12-03 MED ORDER — AMOXICILLIN-POT CLAVULANATE 875-125 MG PO TABS
1.0000 | ORAL_TABLET | Freq: Two times a day (BID) | ORAL | 0 refills | Status: AC
Start: 2019-12-03 — End: 2019-12-06

## 2019-12-03 MED ORDER — HEPARIN SOD (PORK) LOCK FLUSH 100 UNIT/ML IV SOLN
500.0000 [IU] | Freq: Once | INTRAVENOUS | Status: AC
  Administered 2019-12-03: 500 [IU] via INTRAVENOUS
  Filled 2019-12-03: qty 5

## 2019-12-03 NOTE — Progress Notes (Signed)
Ricky Conway 9:49 AM  Subjective: Patient doing well without any obvious post ERCP problems and hopefully he will be able to go home today and his case discussed with his wife as well and his oncologist saw him this morning and will see him back next week to repeat lab work and if better proceed with chemotherapy  Objective: No signs stable afebrile no acute distress abdomen is soft nontender white count 13 LFTs improved Assessment: Stent occlusion and proximal stricture status post repeat stenting  Plan: Okay with me to go home and agree with plan for follow-up labs next week with oncology and happy to see back as needed and probably 3 more days of oral antibiotics ought to be enough  [facility]  office [phone] After 5PM or if no answer call [phone]

## 2019-12-03 NOTE — Discharge Summary (Signed)
Physician Discharge Summary  Ricky Conway NUU:[id number] DOB: 02-20-1946 DOA: 11/30/2019  PCP: Ricky Neer, MD  Admit date: 11/30/2019 Discharge date: 12/03/2019  Admitted From: Home Disposition:  Discharged to home.  Recommendations for Outpatient Follow-up:  1. Follow up with PCP in 1-2 weeks 2. Follow up with Oncology as scheduled.   Discharge Condition: Stable  CODE STATUS: DNR   Brief/Interim Summary: Ricky Conway a 74 y.o.malewith medical history significant ofmetastatic lung cancer with liver mets and CBD obstruction admitted with generalized weakness decreased appetite decreased p.o. intake constipation and abdominal pain. Patient denied any fever chills or cough. He has chronic shortness of breath. He denied any nausea vomiting or diarrhea. He denied any urinary complaints. Patient was a direct admit from cancer center due to concern for CBD obstruction with elevated LFTs. Patient had biliary duct stent placed in March 2020 by Dr. Therisa Conway. Patient is on oxygen at home 24/7 He is followed by Dr. Benay Conway and is on chemo last dose of chemo was 11/12/2019 the next 1 is scheduled for 12/04/2019. He is on Gemzar. Dr. Benay Conway has contacted GI to see the patient while in hospital. He has been constipated for 5 days in spite of taking Dulcolax and and an enema at home. KUB obtained at the cancer center showed constipation with no evidence of obstruction. Patient complains of generalized abdominal pain and distention. His bilirubin today is 8.5 which was normal on 11/26/2019.  6/9: He is now s/p ERCP. Partial obstruction of existing stent was cleared by balloon sweep. Additional stent placed for stricture noted. Started on CLD and advance this evening if tolerating. Follow AM labs.  6/10: He is tolerating diet today. Feels better. Follow up with PCP in 1 - 2 weeks. Follow up with oncology in 1 week. [place]ollow up with GI  As needed. Will continue augmentin through 6/13.    Discharge Diagnoses:  Active Problems:   Liver masses   Common biliary duct obstruction   Hypertension   Hyperlipidemia   COPD, mild (HCC)   Malnutrition of moderate degree   Lung cancer (HCC)   Jaundice   Common bile duct (CBD) obstruction   Liver mass   Leukocytosis  jaundice likely secondary to CBD obstruction in the setting of metastatic lung malignancy with mets to the liver.     - Patient with CBD stent placed in March 2020 by Dr. Therisa Conway. GI consulted appreciate their input.  Patient to go to the OR today     - Bilirubin 7.4 from 8.5 up from 0.7 on 11/26/2019     - 6/9: s/p ERCP w/ existing stent partial obstruction cleared by balloon sweep and additional stent placed in new stricture; started CLD and can advance this evening. Follow up AM labs.     - 6/10: LFTs improved. Tolerating diet. Follow up with oncology as scheduled. Follow up with GI as needed. Stable for discharge to home. Will complete augmentin on 6/13.  constipation     - resolved on MiraLAX Dulcolax and Colace.    leukocytosis     - chest x-ray negative, UA ordered not done.     - 6/10: No fevers. WBC up to 13 today. Follow labs outpt.   hyponatremia likely multifactorial      - patient does appear dehydrated      - hold Lasix and start normal saline     - 6/9: Na+ up 132     - 6/10: Resolved.   Metastatic lung malignancy with mets  to the liver     - followed by Dr. Benay Conway     - continue outpt follow up  hypertension      - continue metoprolol, lisinopril  hypothyroidism      - continue Synthroid  goals of care      - discussed with patient and wife in the room patient wishes to be DO NOT RESUSCITATE  Discharge Instructions   Allergies as of 12/03/2019   No Known Allergies     Medication List    STOP taking these medications   lidocaine-prilocaine cream Commonly known as: EMLA     TAKE these medications   amoxicillin-clavulanate 875-125 MG tablet Commonly known as:  Augmentin Take 1 tablet by mouth every 12 (twelve) hours for 3 days.   aspirin EC 81 MG tablet Take 81 mg by mouth daily.   bisacodyl 5 MG EC tablet Commonly known as: DULCOLAX Take 5 mg by mouth daily as needed for moderate constipation.   furosemide 20 MG tablet Commonly known as: LASIX Take 20 mg by mouth daily.   levothyroxine 50 MCG tablet Commonly known as: SYNTHROID TAKE 1 TABLET BY MOUTH DAILY BEFORE BREAKFAST What changed: See the new instructions.   lisinopril 40 MG tablet Commonly known as: ZESTRIL Take 40 mg by mouth daily.   metoprolol tartrate 25 MG tablet Commonly known as: LOPRESSOR Take 0.5 tablets (12.5 mg total) by mouth 2 (two) times daily.   multivitamin with minerals Tabs tablet Take 1 tablet by mouth daily.   PRESERVISION AREDS PO Take 1 tablet by mouth 2 (two) times a day.   prochlorperazine 5 MG tablet Commonly known as: COMPAZINE Take 1 tablet (5 mg total) by mouth every 6 (six) hours as needed for nausea or vomiting.   simvastatin 40 MG tablet Commonly known as: ZOCOR Take 20 mg by mouth daily at 6 PM.       No Known Allergies  Consultations:  Oncology  GI  Procedures/Studies: DG Chest 1 View  Result Date: 11/30/2019 CLINICAL DATA:  History of lung carcinoma.  Hypertension. EXAM: CHEST  1 VIEW COMPARISON:  Chest CT Nov 19, 2019 FINDINGS: Underlying emphysematous change noted. Scarring is present in the lung bases. There is no edema or airspace opacity. Heart size is within normal limits. Pulmonary vascularity reflects a underlying emphysematous change with decreased vascularity right upper lobe. No adenopathy is appreciable by radiography. Port-A-Cath tip is in the superior vena cava. No bone lesions. There is aortic atherosclerosis. IMPRESSION: Underlying emphysematous change with areas of scarring. No edema or airspace opacity. Heart size within normal limits. Port-A-Cath tip in superior vena cava. Aortic Atherosclerosis  (ICD10-I70.0). Electronically Signed   By: Ricky Conway M.D.   On: 11/30/2019 19:56   CT CHEST W CONTRAST  Result Date: 11/19/2019 CLINICAL DATA:  Non-small cell lung cancer, restaging. EXAM: CT CHEST, ABDOMEN, AND PELVIS WITH CONTRAST TECHNIQUE: Multidetector CT imaging of the chest, abdomen and pelvis was performed following the standard protocol during bolus administration of intravenous contrast. CONTRAST:  168mL OMNIPAQUE IOHEXOL 300 MG/ML  SOLN COMPARISON:  09/08/2019 FINDINGS: CT CHEST FINDINGS Cardiovascular: Moderate cardiac enlargement. Aortic atherosclerosis. Extensive 3 vessel coronary artery atherosclerotic calcifications. No pericardial effusion. Mediastinum/Nodes: Normal appearance of the thyroid gland. The trachea appears patent and is midline. Normal appearance of the esophagus. Index low thoracic periaortic lymph node measures 0.6 cm, image 47/2. Previously 7 mm. No mediastinal or hilar adenopathy identified. Lungs/Pleura: No pleural effusion. Advanced changes of centrilobular and paraseptal emphysema. No  airspace consolidation, atelectasis, or pneumothorax. Subpleural nodule within the lingula is unchanged measuring 4 mm. Right middle lobe perifissural nodule measures 6 mm, image 101/4. Previously this was measured at 7 mm. Index right lower lobe lung nodule measures 3 mm, image 68/4. Stable lateral left upper lobe lung nodule measures 3 mm, image 82/4. Stable. -Other scattered milli metric lung nodules remain unchanged. Musculoskeletal: Remote left rib deformities are again noted. No acute or suspicious osseous abnormalities. CT ABDOMEN PELVIS FINDINGS Hepatobiliary: Multifocal liver metastases are again identified. Index lesion in segment 4 a measures 3.4 x 3.0 cm, image 51/2. Previously 2.7 by 2.3 cm. Index lesion within segment 5 measures 1.8 x 1.4 cm, image 64/2. Previously 1.7 x 1.5 cm. Index lesion within segment 2/3 measures the 2.1 x 2.9 cm, image 55/2. Previously 3.1 x 3.3  cm. Large confluent mass within posterior aspect of segment 7 measures 7.2 x 6.9 cm, unchanged from previous study. Posterior dome of liver lesion measures 1.3 x 1.2 cm, image 48/2. Previously 1.0 x 1.0 cm Similar is parents of mild bladder wall thickening with common bile duct stent in place. Pneumobilia is noted confirming biliary patency. Pancreas: Unremarkable. No pancreatic ductal dilatation or surrounding inflammatory changes. Spleen: Normal in size without focal abnormality. Adrenals/Urinary Tract: Normal right adrenal gland. Left adrenal gland appears hypertrophic, similar to previous study. No kidney mass or hydronephrosis.  Urinary bladder is unremarkable. Stomach/Bowel: Stomach is within normal limits. Appendix is not confidently identified on today's study. No evidence of bowel wall thickening, distention, or inflammatory changes. Vascular/Lymphatic: Aortic atherosclerosis. Mild aneurysmal dilatation of the infrarenal abdominal aorta measuring 3 cm. Unchanged. Previous, index left retroperitoneal lymph node measures 0.7 cm, image 75/2. Previously 1 cm. Reproductive: Prostate gland enlargement. Other: No free fluid or fluid collections identified. Musculoskeletal: Degenerative disc disease identified. No suspicious bone lesions identified. IMPRESSION: 1. Multifocal liver metastases are again identified. When compared with the previous exam some lesions measures smaller while others are slightly increased in size in the interval. The overall tumor burden within the liver however appears relatively stable in the interval. 2. Scattered small pulmonary nodules are stable from previous exam. 3. No significant change in the appearance of right posterior mediastinal periaortic lymph node and left retroperitoneal lymph node. 4. Aortic atherosclerosis 3 vessel coronary artery atherosclerotic calcifications noted. 5. Stable 3 cm infrarenal abdominal aortic aneurysm. Aortic aneurysm NOS (ICD10-I71.9). Aortic  Atherosclerosis (ICD10-I70.0) and Emphysema (ICD10-J43.9). Electronically Signed   By: Kerby Moors M.D.   On: 11/19/2019 15:49   CT ABDOMEN PELVIS W CONTRAST  Result Date: 11/19/2019 CLINICAL DATA:  Non-small cell lung cancer, restaging. EXAM: CT CHEST, ABDOMEN, AND PELVIS WITH CONTRAST TECHNIQUE: Multidetector CT imaging of the chest, abdomen and pelvis was performed following the standard protocol during bolus administration of intravenous contrast. CONTRAST:  167mL OMNIPAQUE IOHEXOL 300 MG/ML  SOLN COMPARISON:  09/08/2019 FINDINGS: CT CHEST FINDINGS Cardiovascular: Moderate cardiac enlargement. Aortic atherosclerosis. Extensive 3 vessel coronary artery atherosclerotic calcifications. No pericardial effusion. Mediastinum/Nodes: Normal appearance of the thyroid gland. The trachea appears patent and is midline. Normal appearance of the esophagus. Index low thoracic periaortic lymph node measures 0.6 cm, image 47/2. Previously 7 mm. No mediastinal or hilar adenopathy identified. Lungs/Pleura: No pleural effusion. Advanced changes of centrilobular and paraseptal emphysema. No airspace consolidation, atelectasis, or pneumothorax. Subpleural nodule within the lingula is unchanged measuring 4 mm. Right middle lobe perifissural nodule measures 6 mm, image 101/4. Previously this was measured at 7 mm. Index right lower lobe lung nodule  measures 3 mm, image 68/4. Stable lateral left upper lobe lung nodule measures 3 mm, image 82/4. Stable. -Other scattered milli metric lung nodules remain unchanged. Musculoskeletal: Remote left rib deformities are again noted. No acute or suspicious osseous abnormalities. CT ABDOMEN PELVIS FINDINGS Hepatobiliary: Multifocal liver metastases are again identified. Index lesion in segment 4 a measures 3.4 x 3.0 cm, image 51/2. Previously 2.7 by 2.3 cm. Index lesion within segment 5 measures 1.8 x 1.4 cm, image 64/2. Previously 1.7 x 1.5 cm. Index lesion within segment 2/3 measures the  2.1 x 2.9 cm, image 55/2. Previously 3.1 x 3.3 cm. Large confluent mass within posterior aspect of segment 7 measures 7.2 x 6.9 cm, unchanged from previous study. Posterior dome of liver lesion measures 1.3 x 1.2 cm, image 48/2. Previously 1.0 x 1.0 cm Similar is parents of mild bladder wall thickening with common bile duct stent in place. Pneumobilia is noted confirming biliary patency. Pancreas: Unremarkable. No pancreatic ductal dilatation or surrounding inflammatory changes. Spleen: Normal in size without focal abnormality. Adrenals/Urinary Tract: Normal right adrenal gland. Left adrenal gland appears hypertrophic, similar to previous study. No kidney mass or hydronephrosis.  Urinary bladder is unremarkable. Stomach/Bowel: Stomach is within normal limits. Appendix is not confidently identified on today's study. No evidence of bowel wall thickening, distention, or inflammatory changes. Vascular/Lymphatic: Aortic atherosclerosis. Mild aneurysmal dilatation of the infrarenal abdominal aorta measuring 3 cm. Unchanged. Previous, index left retroperitoneal lymph node measures 0.7 cm, image 75/2. Previously 1 cm. Reproductive: Prostate gland enlargement. Other: No free fluid or fluid collections identified. Musculoskeletal: Degenerative disc disease identified. No suspicious bone lesions identified. IMPRESSION: 1. Multifocal liver metastases are again identified. When compared with the previous exam some lesions measures smaller while others are slightly increased in size in the interval. The overall tumor burden within the liver however appears relatively stable in the interval. 2. Scattered small pulmonary nodules are stable from previous exam. 3. No significant change in the appearance of right posterior mediastinal periaortic lymph node and left retroperitoneal lymph node. 4. Aortic atherosclerosis 3 vessel coronary artery atherosclerotic calcifications noted. 5. Stable 3 cm infrarenal abdominal aortic aneurysm.  Aortic aneurysm NOS (ICD10-I71.9). Aortic Atherosclerosis (ICD10-I70.0) and Emphysema (ICD10-J43.9). Electronically Signed   By: Kerby Moors M.D.   On: 11/19/2019 15:49   DG ERCP  Result Date: 12/02/2019 CLINICAL DATA:  Obstructive jaundice. EXAM: ERCP TECHNIQUE: Multiple spot images obtained with the fluoroscopic device and submitted for interpretation post-procedure. COMPARISON:  CT abdomen pelvis-11/19/2018; ERCP-09/09/2018 FLUOROSCOPY TIME:  5 minutes, 30 seconds FINDINGS: 17 spot intraoperative fluoroscopic images of the right upper abdominal quadrant during ERCP are provided for review. Initial image demonstrates an ERCP probe overlying the right upper abdominal quadrant. A internal metallic biliary stent overlies expected location of the CBD. Subsequent images demonstrate selective cannulation and opacification of the biliary stent with nonocclusive filling defects seen within its mid and distal aspects. Subsequent images demonstrate a moderate to long segment narrowing/subtotal occlusion at the level of the biliary hilum, superior to the biliary stent. Subsequent images demonstrate placement of an additional overlapping internal biliary stent which extends to the central aspect of the left intrahepatic biliary tree. There is minimal opacification of the intrahepatic biliary tree which appears mildly dilated. There is no opacification of either the cystic or pancreatic ducts. IMPRESSION: ERCP with placement of an additional overlapping internal biliary stent extending to the level of the biliary hilum and central aspect of the left biliary tree. These images were submitted for radiologic interpretation  only. Please see the procedural report for the amount of contrast and the fluoroscopy time utilized. Electronically Signed   By: Sandi Mariscal M.D.   On: 12/02/2019 11:25   DG Abd 2 Views  Result Date: 11/30/2019 CLINICAL DATA:  Metastatic lung cancer, constipation, mid abdominal pain/distension,  evaluate for obstruction EXAM: ABDOMEN - 2 VIEW COMPARISON:  CT abdomen/pelvis dated 11/19/2019 FINDINGS: Nonobstructive bowel gas pattern. No evidence of free air under the diaphragm on the upright view. Moderate colonic stool burden, compatible with the clinical history of constipation. Common duct stent. Mild degenerative changes of the lumbar spine. IMPRESSION: No evidence of small bowel obstruction or free air. Moderate colonic stool burden, compatible with the clinical history of constipation. Electronically Signed   By: Julian Hy M.D.   On: 11/30/2019 11:14     Subjective: "I think it's a good day to go today."  Discharge Exam: Vitals:   12/02/19 2150 12/03/19 0620  BP: (!) 125/51 (!) 121/58  Pulse: 70 62  Resp: 19 18  Temp: (!) 97.5 F (36.4 C) 97.9 F (36.6 C)  SpO2: 91% 90%   Vitals:   12/02/19 1029 12/02/19 1306 12/02/19 2150 12/03/19 0620  BP: (!) 150/73 126/62 (!) 125/51 (!) 121/58  Pulse: 68 65 70 62  Resp: 15 15 19 18   Temp: 97.8 F (36.6 C) 98.5 F (36.9 C) (!) 97.5 F (36.4 C) 97.9 F (36.6 C)  TempSrc: Oral Oral Oral Oral  SpO2: 93% 95% 91% 90%  Weight:      Height:        General: 74 y.o. male resting in bed in NAD Cardiovascular: RRR, +S1, S2, no m/g/r, equal pulses throughout Respiratory: CTABL, no w/r/r, normal WOB GI: BS+, NDNT, no masses noted, no organomegaly noted MSK: No e/c/c Neuro: A&O x 3, no focal deficits Psyc: Appropriate interaction and affect, calm/cooperative  The results of significant diagnostics from this hospitalization (including imaging, microbiology, ancillary and laboratory) are listed below for reference.     Microbiology: Recent Results (from the past 240 hour(s))  SARS Coronavirus 2 by RT PCR (hospital order, performed in [facility] hospital lab) Nasopharyngeal Nasopharyngeal Swab     Status: None   Collection Time: 12/01/19  2:18 PM   Specimen: Nasopharyngeal Swab  Result Value Ref Range Status   SARS  Coronavirus 2 NEGATIVE NEGATIVE Final    Comment: (NOTE) SARS-CoV-2 target nucleic acids are NOT DETECTED. The SARS-CoV-2 RNA is generally detectable in upper and lower respiratory specimens during the acute phase of infection. The lowest concentration of SARS-CoV-2 viral copies this assay can detect is 250 copies / mL. A negative result does not preclude SARS-CoV-2 infection and should not be used as the sole basis for treatment or other patient management decisions.  A negative result may occur with improper specimen collection / handling, submission of specimen other than nasopharyngeal swab, presence of viral mutation(s) within the areas targeted by this assay, and inadequate number of viral copies (<250 copies / mL). A negative result must be combined with clinical observations, patient history, and epidemiological information. Fact Sheet for Patients:   StrictlyIdeas.no Fact Sheet for Healthcare Providers: BankingDealers.co.za This test is not yet approved or cleared  by the [place] FDA and has been authorized for detection and/or diagnosis of SARS-CoV-2 by FDA under an Emergency Use Authorization (EUA).  This EUA will remain in effect (meaning this test can be used) for the duration of the COVID-19 declaration under Section 564(b)(1) of the Act, 21  U.S.C. section 360bbb-3(b)(1), unless the authorization is terminated or revoked sooner. Performed at [facility], [place] [address] [place], [place] [address]      Labs: BNP (last 3 results) No results for input(s): BNP in the last 8760 hours. Basic Metabolic Panel: Recent Labs  Lab 11/30/19 1037 12/01/19 0617 12/02/19 0509 12/03/19 0500  NA 130* 130* 132* 136  K 3.6 3.6 3.5 3.8  CL 88* 92* 93* 96*  CO2 28 27 28  32  GLUCOSE 117* 114* 86 94  BUN 9 13 13 12   CREATININE 0.67 0.49* 0.48* 0.48*  CALCIUM 10.1 8.7* 8.6* 8.6*   Liver Function  Tests: Recent Labs  Lab 11/30/19 1037 12/01/19 0617 12/02/19 0509 12/03/19 0500  AST 185* 116* 75* 51*  ALT 384* 237* 172* 131*  ALKPHOS 539* 366* 309* 282*  BILITOT 8.5* 7.4* 5.1* 2.8*  PROT 7.3 6.1* 5.9* 6.1*  ALBUMIN 3.0* 2.7* 2.6* 2.7*   No results for input(s): LIPASE, AMYLASE in the last 168 hours. No results for input(s): AMMONIA in the last 168 hours. CBC: Recent Labs  Lab 11/30/19 1037 12/01/19 0617 12/02/19 0509 12/03/19 0500  WBC 17.1* 17.4* 12.0* 13.1*  NEUTROABS 14.4*  --   --  9.8*  HGB 16.8 14.5 13.9 13.8  HCT 47.4 42.4 41.4 40.5  MCV 97.9 100.0 101.2* 102.3*  PLT 347 320 314 346   Cardiac Enzymes: No results for input(s): CKTOTAL, CKMB, CKMBINDEX, TROPONINI in the last 168 hours. BNP: Invalid input(s): POCBNP CBG: No results for input(s): GLUCAP in the last 168 hours. D-Dimer No results for input(s): DDIMER in the last 72 hours. Hgb A1c No results for input(s): HGBA1C in the last 72 hours. Lipid Profile No results for input(s): CHOL, HDL, LDLCALC, TRIG, CHOLHDL, LDLDIRECT in the last 72 hours. Thyroid function studies No results for input(s): TSH, T4TOTAL, T3FREE, THYROIDAB in the last 72 hours.  Invalid input(s): FREET3 Anemia work up No results for input(s): VITAMINB12, FOLATE, FERRITIN, TIBC, IRON, RETICCTPCT in the last 72 hours. Urinalysis    Component Value Date/Time   COLORURINE YELLOW 12/24/2018 1655   APPEARANCEUR HAZY (A) 12/24/2018 1655   LABSPEC 1.021 12/24/2018 1655   PHURINE 5.0 12/24/2018 1655   GLUCOSEU NEGATIVE 12/24/2018 1655   HGBUR NEGATIVE 12/24/2018 1655   BILIRUBINUR NEGATIVE 12/24/2018 1655   KETONESUR 5 (A) 12/24/2018 1655   PROTEINUR NEGATIVE 12/24/2018 1655   NITRITE NEGATIVE 12/24/2018 1655   LEUKOCYTESUR NEGATIVE 12/24/2018 1655   Sepsis Labs Invalid input(s): PROCALCITONIN,  WBC,  LACTICIDVEN Microbiology Recent Results (from the past 240 hour(s))  SARS Coronavirus 2 by RT PCR (hospital order, performed  in [place] hospital lab) Nasopharyngeal Nasopharyngeal Swab     Status: None   Collection Time: 12/01/19  2:18 PM   Specimen: Nasopharyngeal Swab  Result Value Ref Range Status   SARS Coronavirus 2 NEGATIVE NEGATIVE Final    Comment: (NOTE) SARS-CoV-2 target nucleic acids are NOT DETECTED. The SARS-CoV-2 RNA is generally detectable in upper and lower respiratory specimens during the acute phase of infection. The lowest concentration of SARS-CoV-2 viral copies this assay can detect is 250 copies / mL. A negative result does not preclude SARS-CoV-2 infection and should not be used as the sole basis for treatment or other patient management decisions.  A negative result may occur with improper specimen collection / handling, submission of specimen other than nasopharyngeal swab, presence of viral mutation(s) within the areas targeted by this assay, and inadequate number of viral copies (<250  copies / mL). A negative result must be combined with clinical observations, patient history, and epidemiological information. Fact Sheet for Patients:   StrictlyIdeas.no Fact Sheet for Healthcare Providers: BankingDealers.co.za This test is not yet approved or cleared  by the [place] FDA and has been authorized for detection and/or diagnosis of SARS-CoV-2 by FDA under an Emergency Use Authorization (EUA).  This EUA will remain in effect (meaning this test can be used) for the duration of the COVID-19 declaration under Section 564(b)(1) of the Act, 21 U.S.C. section 360bbb-3(b)(1), unless the authorization is terminated or revoked sooner. Performed at [facility], [place] [address] [place], [place] [address]      Time coordinating discharge: 35 minutes  SIGNED:   Jonnie Finner, DO  [facility] 12/03/2019, 1:32 PM   If 7PM-7AM, please contact night-coverage [email or web address]

## 2019-12-03 NOTE — Progress Notes (Signed)
PT Cancellation Note  Patient Details Name: Ricky Conway MRN: [id number] DOB: 1945/07/05   Cancelled Treatment:    Reason Eval/Treat Not Completed: Other (comment) Pt declined PT due to expected to discharge shortly and wants to rest.  Pt has been ambulating with supervision today per RN. [name], PT Acute Rehab Services Pager [phone] [facility] [phone]     [name] 12/03/2019, 2:12 PM

## 2019-12-03 NOTE — Progress Notes (Signed)
Patient discharged to home with family, discharge instructions reviewed with patient who verbalized understanding. 

## 2019-12-03 NOTE — Care Management Important Message (Signed)
Important Message  Patient Details IM Letter given to Marney Doctor RN Case Manager to present to the Patient Name: Ricky Conway MRN: [id number] Date of Birth: 06-19-1946   Medicare Important Message Given:  Yes     [name] 12/03/2019, 11:01 AM

## 2019-12-04 ENCOUNTER — Ambulatory Visit: Payer: BC Managed Care – PPO

## 2019-12-04 ENCOUNTER — Other Ambulatory Visit: Payer: BC Managed Care – PPO

## 2019-12-04 ENCOUNTER — Ambulatory Visit: Payer: BC Managed Care – PPO | Admitting: Nurse Practitioner

## 2019-12-09 ENCOUNTER — Telehealth: Payer: Self-pay | Admitting: *Deleted

## 2019-12-09 ENCOUNTER — Telehealth: Payer: Self-pay | Admitting: Oncology

## 2019-12-09 DIAGNOSIS — C3490 Malignant neoplasm of unspecified part of unspecified bronchus or lung: Secondary | ICD-10-CM

## 2019-12-09 NOTE — Telephone Encounter (Signed)
Scheduled appt per 6/16 sch message - pt is aware of appt date and time

## 2019-12-09 NOTE — Telephone Encounter (Addendum)
Wife called to inquire when his next appointment is scheduled? Per Dr. Benay Spice: Would like to begin treatment tomorrow w/lab and MD visit. High priority scheduling message sent.

## 2019-12-11 ENCOUNTER — Inpatient Hospital Stay: Payer: Medicare Other

## 2019-12-11 ENCOUNTER — Other Ambulatory Visit: Payer: Self-pay

## 2019-12-11 ENCOUNTER — Inpatient Hospital Stay (HOSPITAL_BASED_OUTPATIENT_CLINIC_OR_DEPARTMENT_OTHER): Payer: Medicare Other | Admitting: Oncology

## 2019-12-11 VITALS — BP 130/64 | HR 72 | Temp 97.9°F | Resp 16 | Ht 71.0 in | Wt 165.0 lb

## 2019-12-11 DIAGNOSIS — C3490 Malignant neoplasm of unspecified part of unspecified bronchus or lung: Secondary | ICD-10-CM

## 2019-12-11 DIAGNOSIS — K831 Obstruction of bile duct: Secondary | ICD-10-CM | POA: Diagnosis not present

## 2019-12-11 DIAGNOSIS — R970 Elevated carcinoembryonic antigen [CEA]: Secondary | ICD-10-CM | POA: Diagnosis not present

## 2019-12-11 DIAGNOSIS — J449 Chronic obstructive pulmonary disease, unspecified: Secondary | ICD-10-CM | POA: Diagnosis not present

## 2019-12-11 DIAGNOSIS — C3431 Malignant neoplasm of lower lobe, right bronchus or lung: Secondary | ICD-10-CM | POA: Diagnosis present

## 2019-12-11 DIAGNOSIS — R5383 Other fatigue: Secondary | ICD-10-CM | POA: Diagnosis present

## 2019-12-11 DIAGNOSIS — R630 Anorexia: Secondary | ICD-10-CM | POA: Diagnosis not present

## 2019-12-11 DIAGNOSIS — H3530 Unspecified macular degeneration: Secondary | ICD-10-CM | POA: Diagnosis not present

## 2019-12-11 DIAGNOSIS — Z95828 Presence of other vascular implants and grafts: Secondary | ICD-10-CM

## 2019-12-11 DIAGNOSIS — R531 Weakness: Secondary | ICD-10-CM | POA: Diagnosis not present

## 2019-12-11 DIAGNOSIS — I10 Essential (primary) hypertension: Secondary | ICD-10-CM | POA: Diagnosis not present

## 2019-12-11 DIAGNOSIS — C787 Secondary malignant neoplasm of liver and intrahepatic bile duct: Secondary | ICD-10-CM | POA: Diagnosis present

## 2019-12-11 LAB — CMP (CANCER CENTER ONLY)
ALT: 63 U/L — ABNORMAL HIGH (ref 0–44)
AST: 33 U/L (ref 15–41)
Albumin: 3 g/dL — ABNORMAL LOW (ref 3.5–5.0)
Alkaline Phosphatase: 231 U/L — ABNORMAL HIGH (ref 38–126)
Anion gap: 8 (ref 5–15)
BUN: 11 mg/dL (ref 8–23)
CO2: 29 mmol/L (ref 22–32)
Calcium: 9.1 mg/dL (ref 8.9–10.3)
Chloride: 96 mmol/L — ABNORMAL LOW (ref 98–111)
Creatinine: 0.65 mg/dL (ref 0.61–1.24)
GFR, Est AFR Am: >60 mL/min (ref >60)
GFR, Estimated: >60 mL/min (ref >60)
Glucose, Bld: 104 mg/dL — ABNORMAL HIGH (ref 70–99)
Potassium: 4.2 mmol/L (ref 3.5–5.1)
Sodium: 133 mmol/L — ABNORMAL LOW (ref 135–145)
Total Bilirubin: 1.4 mg/dL — ABNORMAL HIGH (ref 0.3–1.2)
Total Protein: 7 g/dL (ref 6.5–8.1)

## 2019-12-11 LAB — CBC WITH DIFFERENTIAL (CANCER CENTER ONLY)
Abs Immature Granulocytes: 0.6 10*3/uL — ABNORMAL HIGH (ref 0.00–0.07)
Basophils Absolute: 0.2 10*3/uL — ABNORMAL HIGH (ref 0.0–0.1)
Basophils Relative: 1 %
Eosinophils Absolute: 0.2 10*3/uL (ref 0.0–0.5)
Eosinophils Relative: 1 %
HCT: 44.4 % (ref 39.0–52.0)
Hemoglobin: 15 g/dL (ref 13.0–17.0)
Immature Granulocytes: 4 %
Lymphocytes Relative: 8 %
Lymphs Abs: 1.1 10*3/uL (ref 0.7–4.0)
MCH: 33.2 pg (ref 26.0–34.0)
MCHC: 33.8 g/dL (ref 30.0–36.0)
MCV: 98.2 fL (ref 80.0–100.0)
Monocytes Absolute: 1.4 10*3/uL — ABNORMAL HIGH (ref 0.1–1.0)
Monocytes Relative: 10 %
Neutro Abs: 10.5 10*3/uL — ABNORMAL HIGH (ref 1.7–7.7)
Neutrophils Relative %: 76 %
Platelet Count: 401 10*3/uL — ABNORMAL HIGH (ref 150–400)
RBC: 4.52 MIL/uL (ref 4.22–5.81)
RDW: 15.2 % (ref 11.5–15.5)
WBC Count: 13.9 10*3/uL — ABNORMAL HIGH (ref 4.0–10.5)
nRBC: 0 % (ref 0.0–0.2)

## 2019-12-11 LAB — TSH: TSH: 3.995 u[IU]/mL (ref 0.320–4.118)

## 2019-12-11 MED ORDER — SODIUM CHLORIDE 0.9% FLUSH
10.0000 mL | INTRAVENOUS | Status: DC | PRN
  Administered 2019-12-11: 10 mL
  Filled 2019-12-11: qty 10

## 2019-12-11 MED ORDER — HEPARIN SOD (PORK) LOCK FLUSH 100 UNIT/ML IV SOLN
500.0000 [IU] | Freq: Once | INTRAVENOUS | Status: AC | PRN
  Administered 2019-12-11: 500 [IU]
  Filled 2019-12-11: qty 5

## 2019-12-11 NOTE — Patient Instructions (Signed)

## 2019-12-11 NOTE — Progress Notes (Signed)
[facility] OFFICE PROGRESS NOTE   Diagnosis: Non-small cell carcinoma  INTERVAL HISTORY:   Ricky Conway returns for a scheduled visit.  He was discharged from the hospital on 12/03/2019 after admission with jaundice.  He underwent an ERCP on 12/02/2019.  The biliary stent was obstructed.  There was a stricture above the stent.  Stones and sludge were removed from the stent.  An uncovered metal stent was placed into the common bile duct traversing the previous stent.  The liver enzymes and bilirubin were improved on 12/03/2019.  Ricky Conway reports feeling weak.  He has exertional dyspnea and anorexia.  No pain.  He is staying in the house most of the time.  He is here today with his wife. Objective:  Vital signs in last 24 hours:  Blood pressure 130/64, pulse 72, temperature 97.9 F (36.6 C), temperature source Temporal, resp. rate 16, height _0  (1.803 m), weight 165 lb (74.8 kg), SpO2 96 %.    HEENT: Sclera anicteric, no thrush or ulcers Resp: Bilateral expiratory rhonchi, no respiratory distress, distant breath sounds Cardio: Regular rate and rhythm GI: Fullness in the right upper abdomen, nontender Vascular: No leg edema Neuro: Alert and oriented   Portacath/PICC-without erythema  Lab Results:  Lab Results  Component Value Date   WBC 13.9 (H) 12/11/2019   HGB 15.0 12/11/2019   HCT 44.4 12/11/2019   MCV 98.2 12/11/2019   PLT 401 (H) 12/11/2019   NEUTROABS 10.5 (H) 12/11/2019    CMP  Lab Results  Component Value Date   NA 133 (L) 12/11/2019   K 4.2 12/11/2019   CL 96 (L) 12/11/2019   CO2 29 12/11/2019   GLUCOSE 104 (H) 12/11/2019   BUN 11 12/11/2019   CREATININE 0.65 12/11/2019   CALCIUM 9.1 12/11/2019   PROT 7.0 12/11/2019   ALBUMIN 3.0 (L) 12/11/2019   AST 33 12/11/2019   ALT 63 (H) 12/11/2019   ALKPHOS 231 (H) 12/11/2019   BILITOT 1.4 (H) 12/11/2019   GFRNONAA >60 12/11/2019   GFRAA >60 12/11/2019    Lab Results  Component Value Date    CEA1 2,342.73 (H) 11/26/2019     Medications: I have reviewed the patient's current medications.   Assessment/Plan:  1. Pancreas mass, liver lesions  Abdominal ultrasound 09/03/2018-3 liver lesions. Intra-and extrahepatic biliary ductal dilatation.  Presentation to the emergency department 09/08/2018 with painless jaundice. Initial bilirubin returned at 18.3.   MRI abdomen 09/08/2018-1.9 x 2.6 cm hypoenhancing lesion arising exophytically along the posterior aspect of the pancreatic head suspicious for pancreatic adenocarcinoma. There was suspected involvement of the distal common duct with secondary intrahepatic/extrahepatic ductal dilatation. Lesion noted to abut the IVC. Multiple liver metastases noted. Small upper abdominal/retroperitoneal lymph nodes noted.   ERCP 09/09/2018-high-grade stricture and obstruction of the distal common bile duct. A metallic biliary stent was placed. Brushings in the mid and lower third of the main bile duct were obtained with no malignant cells identified.   Bilirubin improved at 4.7 on 09/10/2018.   Biopsy of a liver lesion on 09/11/2018-metastatic carcinoma, cytokeratin 7 and TTF-1 positive, negative for cytokeratin 20, CDX 2, and PSA.  Foundation 1-microsatellite stable, tumor mutation burden 16, ERBB2 amplification  CT chest 09/22/2018-right hilar/mediastinal/retrocrural lymphadenopathy, ill-defined right lower lobe nodularity, emphysema  Cycle 1 carboplatin/Alimta/pembrolizumab 10/09/2018  Cycle 2 carboplatin/Alimta/pembrolizumab 10/30/2018  Cycle 3 carboplatin/Alimta/pembrolizumab 11/20/2018  Cycle 4 carboplatin/Alimta/pembrolizumab 12/11/2018  CT 12/26/2018-marked response to therapy of the radical abdominal adenopathy, decreased size of hepatic metastases, no new or  progressive disease  Cycle 5 Alimta/pembrolizumab 01/01/2019  Cycle 6 Alimta/pembrolizumab 01/23/2019  Cycle 7 Alimta/pembrolizumab 02/12/2019  Cycle 8 Alimta/pembrolizumab  03/05/2019  Cycle 9 Alimta/pembrolizumab 03/26/2019  CTs 04/14/2019-slight interval decrease in size and conspicuity of multiple, irregular low-attenuation liver lesions. No new liver lesions. No recurrent mediastinal or hilar lymphadenopathy. No change in mildly prominent subcentimeter periaortic and peripheral lymph nodes without evidence of recurrent abdominal or pelvic lymphadenopathy.  Cycle 10 Alimta/pembrolizumab 04/16/2019  Cycle 11 pembrolizumab 05/07/2019 (Alimta held secondary to increased tearing and leg edema)  Cycle 12 Alimta/pembrolizumab 05/28/2019  Cycle 13 Alimta/pembrolizumab 06/18/2019  Restaging CTs 07/08/2019-worsening of low-density hepatic lesions. Scattered small lymph nodes with increasing conspicuity, largest along the left periaortic chain approximately 9 mm previously approximately 5 mm.  Cycle 14 Alimta/pembrolizumab 07/09/2019  Cycle 15 Alimta/pembrolizumab 07/31/2019  Cycle 16 Alimta/pembrolizumab 08/20/2019  Rising CEA beginning December 2020  CTs 09/08/2019-enlargement of liver metastases, mild enlargement of abdominal/retroperitoneal and lower thoracic nodes, nonspecific pulmonary nodules-stable  Cycle 1 gemcitabine 09/17/2019  Cycle 2 gemcitabine 10/01/2019  Cycle 3 gemcitabine 10/15/2019  Cycle 4 gemcitabine 10/29/2019  Cycle 5 gemcitabine 11/12/2019  Rising CEA 10/2019  CTs 11/19/19 mixed response in liver, stable pulmonary nodules, thoracic and RP nodes, overall stable  Plan to intensify chemo carboplatin AUC 4 plus gemcitabine given on day 1 q21 days starting ~12/03/19    Admission 11/30/2019 with recurrent biliary obstruction-ERCP 12/02/2019-obstructed biliary stent, new stent placed 2. COPD 3. Hypertension 4. Trace edema right leg 12/11/2018. Doppler negative for DVT 12/12/2018. 5. Watery eyes, runny nose reported 12/11/2018. Question related to Alimta. Persistent watery eyes 04/16/2019. Ophthalmology evaluation 05/11/2019-blepharitis,ectropion  left lower lid, macular degeneration 6. Admission 12/24/2018 with a fever-no source for infection identified, completed outpatient course of ciprofloxacin 7. Port-A-Cath placement 09/14/2019, interventional radiology 8. Colonoscopy 06/16/2015-diverticulosis in the sigmoid colon, examination otherwise normal    Disposition: Ricky Conway was discharged in the hospital on 12/03/2019 after admission with biliary obstruction.  He underwent placement of a new bile duct stent in the elevated liver enzymes/hyperbilirubinemia have improved.  His performance status has declined over the past month.  A CT on 11/19/2019 revealed overall stable disease, but the CEA has been higher for the past several months.  The elevated CEA could be in part related to the biliary obstruction.  I discussed treatment options with Ricky Conway and his wife.  We discussed comfort care versus a trial of salvage systemic therapy.  I recommend gemcitabine/carboplatin.  He responded to carboplatin based therapy last year.  Ricky Conway is considering these options.  He does not wish to begin treatment today.  He will return for an office visit with the plan to begin gemcitabine/carboplatin in 2 weeks.  He will contact us in the interim as needed.  [name], MD  12/11/2019  3:04 PM

## 2019-12-14 ENCOUNTER — Telehealth: Payer: Self-pay | Admitting: Oncology

## 2019-12-14 NOTE — Telephone Encounter (Signed)
Made not changes to pt's schedule per 6/18 los

## 2019-12-24 ENCOUNTER — Inpatient Hospital Stay: Payer: Medicare Other

## 2019-12-24 ENCOUNTER — Other Ambulatory Visit: Payer: Self-pay

## 2019-12-24 ENCOUNTER — Inpatient Hospital Stay: Payer: Medicare Other | Attending: Oncology

## 2019-12-24 ENCOUNTER — Encounter: Payer: Self-pay | Admitting: Nurse Practitioner

## 2019-12-24 ENCOUNTER — Inpatient Hospital Stay (HOSPITAL_BASED_OUTPATIENT_CLINIC_OR_DEPARTMENT_OTHER): Payer: Medicare Other | Admitting: Nurse Practitioner

## 2019-12-24 VITALS — BP 147/78 | HR 66 | Temp 97.9°F | Resp 20 | Ht 71.0 in | Wt 165.7 lb

## 2019-12-24 DIAGNOSIS — C3490 Malignant neoplasm of unspecified part of unspecified bronchus or lung: Secondary | ICD-10-CM

## 2019-12-24 DIAGNOSIS — C787 Secondary malignant neoplasm of liver and intrahepatic bile duct: Secondary | ICD-10-CM | POA: Insufficient documentation

## 2019-12-24 DIAGNOSIS — Z5111 Encounter for antineoplastic chemotherapy: Secondary | ICD-10-CM | POA: Insufficient documentation

## 2019-12-24 DIAGNOSIS — C3431 Malignant neoplasm of lower lobe, right bronchus or lung: Secondary | ICD-10-CM | POA: Diagnosis present

## 2019-12-24 DIAGNOSIS — Z95828 Presence of other vascular implants and grafts: Secondary | ICD-10-CM

## 2019-12-24 LAB — CBC WITH DIFFERENTIAL (CANCER CENTER ONLY)
Abs Immature Granulocytes: 0.05 10*3/uL (ref 0.00–0.07)
Basophils Absolute: 0 10*3/uL (ref 0.0–0.1)
Basophils Relative: 0 %
Eosinophils Absolute: 0.1 10*3/uL (ref 0.0–0.5)
Eosinophils Relative: 1 %
HCT: 44.2 % (ref 39.0–52.0)
Hemoglobin: 15.3 g/dL (ref 13.0–17.0)
Immature Granulocytes: 0 %
Lymphocytes Relative: 13 %
Lymphs Abs: 1.6 10*3/uL (ref 0.7–4.0)
MCH: 33.9 pg (ref 26.0–34.0)
MCHC: 34.6 g/dL (ref 30.0–36.0)
MCV: 98 fL (ref 80.0–100.0)
Monocytes Absolute: 1.2 10*3/uL — ABNORMAL HIGH (ref 0.1–1.0)
Monocytes Relative: 10 %
Neutro Abs: 9.6 10*3/uL — ABNORMAL HIGH (ref 1.7–7.7)
Neutrophils Relative %: 76 %
Platelet Count: 309 10*3/uL (ref 150–400)
RBC: 4.51 MIL/uL (ref 4.22–5.81)
RDW: 14.6 % (ref 11.5–15.5)
WBC Count: 12.6 10*3/uL — ABNORMAL HIGH (ref 4.0–10.5)
nRBC: 0 % (ref 0.0–0.2)

## 2019-12-24 LAB — CMP (CANCER CENTER ONLY)
ALT: 32 U/L (ref 0–44)
AST: 30 U/L (ref 15–41)
Albumin: 3.3 g/dL — ABNORMAL LOW (ref 3.5–5.0)
Alkaline Phosphatase: 158 U/L — ABNORMAL HIGH (ref 38–126)
Anion gap: 11 (ref 5–15)
BUN: 8 mg/dL (ref 8–23)
CO2: 28 mmol/L (ref 22–32)
Calcium: 8.9 mg/dL (ref 8.9–10.3)
Chloride: 93 mmol/L — ABNORMAL LOW (ref 98–111)
Creatinine: 0.66 mg/dL (ref 0.61–1.24)
GFR, Est AFR Am: >60 mL/min (ref >60)
GFR, Estimated: >60 mL/min (ref >60)
Glucose, Bld: 99 mg/dL (ref 70–99)
Potassium: 4 mmol/L (ref 3.5–5.1)
Sodium: 132 mmol/L — ABNORMAL LOW (ref 135–145)
Total Bilirubin: 1.1 mg/dL (ref 0.3–1.2)
Total Protein: 7 g/dL (ref 6.5–8.1)

## 2019-12-24 LAB — CEA (IN HOUSE-CHCC): CEA (CHCC-In House): 7377.03 ng/mL — ABNORMAL HIGH (ref 0.00–5.00)

## 2019-12-24 MED ORDER — SODIUM CHLORIDE 0.9 % IV SOLN
150.0000 mg | Freq: Once | INTRAVENOUS | Status: AC
  Administered 2019-12-24: 150 mg via INTRAVENOUS
  Filled 2019-12-24: qty 5
  Filled 2019-12-24: qty 150

## 2019-12-24 MED ORDER — SODIUM CHLORIDE 0.9 % IV SOLN
2000.0000 mg | Freq: Once | INTRAVENOUS | Status: AC
  Administered 2019-12-24: 2000 mg via INTRAVENOUS
  Filled 2019-12-24: qty 52.6

## 2019-12-24 MED ORDER — FAMOTIDINE IN NACL 20-0.9 MG/50ML-% IV SOLN
20.0000 mg | Freq: Once | INTRAVENOUS | Status: AC
  Administered 2019-12-24: 20 mg via INTRAVENOUS

## 2019-12-24 MED ORDER — SODIUM CHLORIDE 0.9% FLUSH
10.0000 mL | INTRAVENOUS | Status: DC | PRN
  Administered 2019-12-24: 10 mL
  Filled 2019-12-24: qty 10

## 2019-12-24 MED ORDER — SODIUM CHLORIDE 0.9 % IV SOLN
Freq: Once | INTRAVENOUS | Status: AC
  Filled 2019-12-24: qty 250

## 2019-12-24 MED ORDER — SODIUM CHLORIDE 0.9 % IV SOLN
392.4000 mg | Freq: Once | INTRAVENOUS | Status: AC
  Administered 2019-12-24: 390 mg via INTRAVENOUS
  Filled 2019-12-24: qty 39

## 2019-12-24 MED ORDER — PALONOSETRON HCL INJECTION 0.25 MG/5ML
INTRAVENOUS | Status: AC
  Filled 2019-12-24: qty 5

## 2019-12-24 MED ORDER — SODIUM CHLORIDE 0.9 % IV SOLN
10.0000 mg | Freq: Once | INTRAVENOUS | Status: AC
  Administered 2019-12-24: 10 mg via INTRAVENOUS
  Filled 2019-12-24: qty 10
  Filled 2019-12-24: qty 1

## 2019-12-24 MED ORDER — DIPHENHYDRAMINE HCL 50 MG/ML IJ SOLN
25.0000 mg | Freq: Once | INTRAMUSCULAR | Status: AC
  Administered 2019-12-24: 25 mg via INTRAVENOUS

## 2019-12-24 MED ORDER — DIPHENHYDRAMINE HCL 50 MG/ML IJ SOLN
INTRAMUSCULAR | Status: AC
  Filled 2019-12-24: qty 1

## 2019-12-24 MED ORDER — PALONOSETRON HCL INJECTION 0.25 MG/5ML
0.2500 mg | Freq: Once | INTRAVENOUS | Status: AC
  Administered 2019-12-24: 0.25 mg via INTRAVENOUS

## 2019-12-24 MED ORDER — FAMOTIDINE IN NACL 20-0.9 MG/50ML-% IV SOLN
INTRAVENOUS | Status: AC
  Filled 2019-12-24: qty 50

## 2019-12-24 MED ORDER — HEPARIN SOD (PORK) LOCK FLUSH 100 UNIT/ML IV SOLN
500.0000 [IU] | Freq: Once | INTRAVENOUS | Status: AC | PRN
  Administered 2019-12-24: 500 [IU]
  Filled 2019-12-24: qty 5

## 2019-12-24 NOTE — Progress Notes (Addendum)
[facility] OFFICE PROGRESS NOTE   Diagnosis: Non-small cell carcinoma  INTERVAL HISTORY:   Ricky Conway returns as scheduled.  He recently noticed a "knot" below the right ear, along the jawline.  He overall feels weak.  Appetite is good.  No fever.  He denies nausea/vomiting.  Objective:  Vital signs in last 24 hours:  Blood pressure (!) 147/78, pulse 66, temperature 97.9 F (36.6 C), temperature source Temporal, resp. rate 20, height '5\' 11"'  (1.803 m), weight 165 lb 11.2 oz (75.2 kg), SpO2 97 %.    HEENT: 1 cm mobile, rounded lesion at the angle of the jaw on the right. Resp: Distant breath sounds. Cardio: Regular rate and rhythm. GI: Abdomen soft and nontender.  Fullness right upper abdomen. Vascular: Trace edema at the ankles. Neuro: Alert and oriented. Port-A-Cath without erythema.  Lab Results:  Lab Results  Component Value Date   WBC 13.9 (H) 12/11/2019   HGB 15.0 12/11/2019   HCT 44.4 12/11/2019   MCV 98.2 12/11/2019   PLT 401 (H) 12/11/2019   NEUTROABS 10.5 (H) 12/11/2019    Imaging:  No results found.  Medications: I have reviewed the patient's current medications.  Assessment/Plan: 1. Pancreas mass, liver lesions  Abdominal ultrasound 09/03/2018-3 liver lesions. Intra-and extrahepatic biliary ductal dilatation.  Presentation to the emergency department 09/08/2018 with painless jaundice. Initial bilirubin returned at 18.3.   MRI abdomen 09/08/2018-1.9 x 2.6 cm hypoenhancing lesion arising exophytically along the posterior aspect of the pancreatic head suspicious for pancreatic adenocarcinoma. There was suspected involvement of the distal common duct with secondary intrahepatic/extrahepatic ductal dilatation. Lesion noted to abut the IVC. Multiple liver metastases noted. Small upper abdominal/retroperitoneal lymph nodes noted.   ERCP 09/09/2018-high-grade stricture and obstruction of the distal common bile duct. A metallic biliary stent  was placed. Brushings in the mid and lower third of the main bile duct were obtained with no malignant cells identified.   Bilirubin improved at 4.7 on 09/10/2018.   Biopsy of a liver lesion on 09/11/2018-metastatic carcinoma, cytokeratin 7 and TTF-1 positive, negative for cytokeratin 20, CDX 2, and PSA.  Foundation 1-microsatellite stable, tumor mutation burden 16, ERBB2 amplification  CT chest 09/22/2018-right hilar/mediastinal/retrocrural lymphadenopathy, ill-defined right lower lobe nodularity, emphysema  Cycle 1 carboplatin/Alimta/pembrolizumab 10/09/2018  Cycle 2 carboplatin/Alimta/pembrolizumab 10/30/2018  Cycle 3 carboplatin/Alimta/pembrolizumab 11/20/2018  Cycle 4 carboplatin/Alimta/pembrolizumab 12/11/2018  CT 12/26/2018-marked response to therapy of the radical abdominal adenopathy, decreased size of hepatic metastases, no new or progressive disease  Cycle 5 Alimta/pembrolizumab 01/01/2019  Cycle 6 Alimta/pembrolizumab 01/23/2019  Cycle 7 Alimta/pembrolizumab 02/12/2019  Cycle 8 Alimta/pembrolizumab 03/05/2019  Cycle 9 Alimta/pembrolizumab 03/26/2019  CTs 04/14/2019-slight interval decrease in size and conspicuity of multiple, irregular low-attenuation liver lesions. No new liver lesions. No recurrent mediastinal or hilar lymphadenopathy. No change in mildly prominent subcentimeter periaortic and peripheral lymph nodes without evidence of recurrent abdominal or pelvic lymphadenopathy.  Cycle 10 Alimta/pembrolizumab 04/16/2019  Cycle 11 pembrolizumab 05/07/2019 (Alimta held secondary to increased tearing and leg edema)  Cycle 12 Alimta/pembrolizumab 05/28/2019  Cycle 13 Alimta/pembrolizumab 06/18/2019  Restaging CTs 07/08/2019-worsening of low-density hepatic lesions. Scattered small lymph nodes with increasing conspicuity, largest along the left periaortic chain approximately 9 mm previously approximately 5 mm.  Cycle 14 Alimta/pembrolizumab 07/09/2019  Cycle 15  Alimta/pembrolizumab 07/31/2019  Cycle 16 Alimta/pembrolizumab 08/20/2019  Rising CEA beginning December 2020  CTs 09/08/2019-enlargement of liver metastases, mild enlargement of abdominal/retroperitoneal and lower thoracic nodes, nonspecific pulmonary nodules-stable  Cycle 1 gemcitabine 09/17/2019  Cycle 2 gemcitabine 10/01/2019  Cycle 3 gemcitabine 10/15/2019  Cycle 4 gemcitabine 10/29/2019  Cycle 5 gemcitabine 11/12/2019  Rising CEA 10/2019  CTs 11/19/19 mixed response in liver, stable pulmonary nodules, thoracic and RP nodes, overall stable  Plan to intensify chemo carboplatin AUC 4 plus gemcitabine given on day 1 q21 days starting ~12/03/19  Admission 11/30/2019 with recurrent biliary obstruction-ERCP 12/02/2019-obstructed biliary stent, new stent placed  Cycle 1 gemcitabine/carboplatin 12/24/2019 2. COPD 3. Hypertension 4. Trace edema right leg 12/11/2018. Doppler negative for DVT 12/12/2018. 5. Watery eyes, runny nose reported 12/11/2018. Question related to Alimta. Persistent watery eyes 04/16/2019. Ophthalmology evaluation 05/11/2019-blepharitis,ectropion left lower lid, macular degeneration 6. Admission 12/24/2018 with a fever-no source for infection identified, completed outpatient course of ciprofloxacin 7. Port-A-Cath placement 09/14/2019, interventional radiology 8. Colonoscopy 06/16/2015-diverticulosis in the sigmoid colon, examination otherwise normal    Disposition: Ricky Conway appears stable.  He is scheduled to begin treatment with gemcitabine/carboplatin today.  We again reviewed potential toxicities.  He agrees to proceed.  We reviewed the chemistry panel from today.  Labs are adequate to proceed.  Bilirubin has normalized.    He will return for lab, follow-up, cycle 2 gemcitabine/carboplatin in 3 weeks.  He will contact the office in the interim with any problems.  Patient seen with Dr. Benay Spice.   Ned Card ANP/GNP-BC   12/24/2019  12:38 PM  This was a shared  visit with Ned Card.  Mr. Trivedi has decided to proceed with chemotherapy.  He will complete cycle 1 gemcitabine/carboplatin today.  Julieanne Manson, MD

## 2019-12-24 NOTE — Patient Instructions (Signed)
[facility] Discharge Instructions for Patients Receiving Chemotherapy  Today you received the following chemotherapy agents: gemzar, carboplatin  To help prevent nausea and vomiting after your treatment, we encourage you to take your nausea medication as needed   If you develop nausea and vomiting that is not controlled by your nausea medication, call the clinic.   BELOW ARE SYMPTOMS THAT SHOULD BE REPORTED IMMEDIATELY:  *FEVER GREATER THAN 100.5 F  *CHILLS WITH OR WITHOUT FEVER  NAUSEA AND VOMITING THAT IS NOT CONTROLLED WITH YOUR NAUSEA MEDICATION  *UNUSUAL SHORTNESS OF BREATH  *UNUSUAL BRUISING OR BLEEDING  TENDERNESS IN MOUTH AND THROAT WITH OR WITHOUT PRESENCE OF ULCERS  *URINARY PROBLEMS  *BOWEL PROBLEMS  UNUSUAL RASH Items with * indicate a potential emergency and should be followed up as soon as possible.  Feel free to call the clinic should you have any questions or concerns. The clinic phone number is [phone].  Please show the [facility] at check-in to the Emergency Department and triage nurse.

## 2019-12-25 ENCOUNTER — Telehealth: Payer: Self-pay | Admitting: Oncology

## 2019-12-25 NOTE — Telephone Encounter (Signed)
Scheduled per 7/1 los. Called and spoke with Onalee Hua (wife), confirmed 7/22 and 8/12 appts

## 2020-01-10 ENCOUNTER — Other Ambulatory Visit: Payer: Self-pay | Admitting: Oncology

## 2020-01-14 ENCOUNTER — Other Ambulatory Visit: Payer: Self-pay

## 2020-01-14 ENCOUNTER — Inpatient Hospital Stay (HOSPITAL_BASED_OUTPATIENT_CLINIC_OR_DEPARTMENT_OTHER): Payer: Medicare Other | Admitting: Nurse Practitioner

## 2020-01-14 ENCOUNTER — Inpatient Hospital Stay: Payer: Medicare Other

## 2020-01-14 ENCOUNTER — Encounter: Payer: Self-pay | Admitting: Nurse Practitioner

## 2020-01-14 VITALS — BP 123/71 | HR 83 | Temp 97.6°F | Resp 18 | Ht 71.0 in | Wt 164.8 lb

## 2020-01-14 DIAGNOSIS — Z95828 Presence of other vascular implants and grafts: Secondary | ICD-10-CM

## 2020-01-14 DIAGNOSIS — C3490 Malignant neoplasm of unspecified part of unspecified bronchus or lung: Secondary | ICD-10-CM

## 2020-01-14 DIAGNOSIS — Z5111 Encounter for antineoplastic chemotherapy: Secondary | ICD-10-CM | POA: Diagnosis not present

## 2020-01-14 LAB — CMP (CANCER CENTER ONLY)
ALT: 93 U/L — ABNORMAL HIGH (ref 0–44)
AST: 43 U/L — ABNORMAL HIGH (ref 15–41)
Albumin: 3.1 g/dL — ABNORMAL LOW (ref 3.5–5.0)
Alkaline Phosphatase: 227 U/L — ABNORMAL HIGH (ref 38–126)
Anion gap: 12 (ref 5–15)
BUN: 9 mg/dL (ref 8–23)
CO2: 29 mmol/L (ref 22–32)
Calcium: 9.8 mg/dL (ref 8.9–10.3)
Chloride: 94 mmol/L — ABNORMAL LOW (ref 98–111)
Creatinine: 0.64 mg/dL (ref 0.61–1.24)
GFR, Est AFR Am: >60 mL/min (ref >60)
GFR, Estimated: >60 mL/min (ref >60)
Glucose, Bld: 104 mg/dL — ABNORMAL HIGH (ref 70–99)
Potassium: 4.1 mmol/L (ref 3.5–5.1)
Sodium: 135 mmol/L (ref 135–145)
Total Bilirubin: 0.5 mg/dL (ref 0.3–1.2)
Total Protein: 7.1 g/dL (ref 6.5–8.1)

## 2020-01-14 LAB — CBC WITH DIFFERENTIAL (CANCER CENTER ONLY)
Abs Immature Granulocytes: 0.09 10*3/uL — ABNORMAL HIGH (ref 0.00–0.07)
Basophils Absolute: 0.1 10*3/uL (ref 0.0–0.1)
Basophils Relative: 1 %
Eosinophils Absolute: 0.1 10*3/uL (ref 0.0–0.5)
Eosinophils Relative: 1 %
HCT: 42.6 % (ref 39.0–52.0)
Hemoglobin: 14.4 g/dL (ref 13.0–17.0)
Immature Granulocytes: 1 %
Lymphocytes Relative: 18 %
Lymphs Abs: 1.3 10*3/uL (ref 0.7–4.0)
MCH: 32.6 pg (ref 26.0–34.0)
MCHC: 33.8 g/dL (ref 30.0–36.0)
MCV: 96.4 fL (ref 80.0–100.0)
Monocytes Absolute: 1.2 10*3/uL — ABNORMAL HIGH (ref 0.1–1.0)
Monocytes Relative: 17 %
Neutro Abs: 4.3 10*3/uL (ref 1.7–7.7)
Neutrophils Relative %: 62 %
Platelet Count: 503 10*3/uL — ABNORMAL HIGH (ref 150–400)
RBC: 4.42 MIL/uL (ref 4.22–5.81)
RDW: 14.7 % (ref 11.5–15.5)
WBC Count: 7 10*3/uL (ref 4.0–10.5)
nRBC: 0 % (ref 0.0–0.2)

## 2020-01-14 LAB — CEA (IN HOUSE-CHCC): CEA (CHCC-In House): 4759.89 ng/mL — ABNORMAL HIGH (ref 0.00–5.00)

## 2020-01-14 MED ORDER — SODIUM CHLORIDE 0.9 % IV SOLN
2000.0000 mg | Freq: Once | INTRAVENOUS | Status: AC
  Administered 2020-01-14: 2000 mg via INTRAVENOUS
  Filled 2020-01-14: qty 52.6

## 2020-01-14 MED ORDER — DIPHENHYDRAMINE HCL 50 MG/ML IJ SOLN
INTRAMUSCULAR | Status: AC
  Filled 2020-01-14: qty 1

## 2020-01-14 MED ORDER — FAMOTIDINE IN NACL 20-0.9 MG/50ML-% IV SOLN
INTRAVENOUS | Status: AC
  Filled 2020-01-14: qty 50

## 2020-01-14 MED ORDER — SODIUM CHLORIDE 0.9 % IV SOLN
150.0000 mg | Freq: Once | INTRAVENOUS | Status: AC
  Administered 2020-01-14: 150 mg via INTRAVENOUS
  Filled 2020-01-14: qty 150

## 2020-01-14 MED ORDER — SODIUM CHLORIDE 0.9 % IV SOLN
Freq: Once | INTRAVENOUS | Status: AC
  Filled 2020-01-14: qty 250

## 2020-01-14 MED ORDER — DIPHENHYDRAMINE HCL 50 MG/ML IJ SOLN
25.0000 mg | Freq: Once | INTRAMUSCULAR | Status: AC
  Administered 2020-01-14: 25 mg via INTRAVENOUS

## 2020-01-14 MED ORDER — SODIUM CHLORIDE 0.9 % IV SOLN
392.4000 mg | Freq: Once | INTRAVENOUS | Status: AC
  Administered 2020-01-14: 390 mg via INTRAVENOUS
  Filled 2020-01-14: qty 39

## 2020-01-14 MED ORDER — SODIUM CHLORIDE 0.9% FLUSH
10.0000 mL | INTRAVENOUS | Status: DC | PRN
  Administered 2020-01-14: 10 mL
  Filled 2020-01-14: qty 10

## 2020-01-14 MED ORDER — PALONOSETRON HCL INJECTION 0.25 MG/5ML
0.2500 mg | Freq: Once | INTRAVENOUS | Status: AC
  Administered 2020-01-14: 0.25 mg via INTRAVENOUS

## 2020-01-14 MED ORDER — HEPARIN SOD (PORK) LOCK FLUSH 100 UNIT/ML IV SOLN
500.0000 [IU] | Freq: Once | INTRAVENOUS | Status: AC | PRN
  Administered 2020-01-14: 500 [IU]
  Filled 2020-01-14: qty 5

## 2020-01-14 MED ORDER — FAMOTIDINE IN NACL 20-0.9 MG/50ML-% IV SOLN
20.0000 mg | Freq: Once | INTRAVENOUS | Status: AC
  Administered 2020-01-14: 20 mg via INTRAVENOUS

## 2020-01-14 MED ORDER — PALONOSETRON HCL INJECTION 0.25 MG/5ML
INTRAVENOUS | Status: AC
  Filled 2020-01-14: qty 5

## 2020-01-14 MED ORDER — SODIUM CHLORIDE 0.9 % IV SOLN
10.0000 mg | Freq: Once | INTRAVENOUS | Status: AC
  Administered 2020-01-14: 10 mg via INTRAVENOUS
  Filled 2020-01-14: qty 10

## 2020-01-14 NOTE — Progress Notes (Signed)
Ned Card NP advised ok to treat with elevated LFT's.

## 2020-01-14 NOTE — Patient Instructions (Signed)
[facility] Discharge Instructions for Patients Receiving Chemotherapy  Today you received the following chemotherapy agents: gemcitabine and carboplatin.  To help prevent nausea and vomiting after your treatment, we encourage you to take your nausea medication as directed.   If you develop nausea and vomiting that is not controlled by your nausea medication, call the clinic.   BELOW ARE SYMPTOMS THAT SHOULD BE REPORTED IMMEDIATELY:  *FEVER GREATER THAN 100.5 F  *CHILLS WITH OR WITHOUT FEVER  NAUSEA AND VOMITING THAT IS NOT CONTROLLED WITH YOUR NAUSEA MEDICATION  *UNUSUAL SHORTNESS OF BREATH  *UNUSUAL BRUISING OR BLEEDING  TENDERNESS IN MOUTH AND THROAT WITH OR WITHOUT PRESENCE OF ULCERS  *URINARY PROBLEMS  *BOWEL PROBLEMS  UNUSUAL RASH Items with * indicate a potential emergency and should be followed up as soon as possible.  Feel free to call the clinic should you have any questions or concerns. The clinic phone number is [phone].  Please show the [facility] at check-in to the Emergency Department and triage nurse.

## 2020-01-14 NOTE — Progress Notes (Signed)
[facility] OFFICE PROGRESS NOTE   Diagnosis: Non-small cell carcinoma  INTERVAL HISTORY:   Mr. Dambach returns as scheduled.  He completed cycle 1 gemcitabine/carboplatin 7 06/2019.  He feels he tolerated the chemotherapy well.  No nausea or vomiting.  No mouth sores.  No diarrhea.  No rash or fever following treatment.  Stable dyspnea and cough.  Objective:  Vital signs in last 24 hours:  Blood pressure 123/71, pulse 83, temperature 97.6 F (36.4 C), temperature source Temporal, resp. rate 18, height _0  (1.803 m), weight 164 lb 12.8 oz (74.8 kg), SpO2 95 %.    HEENT: No thrush or ulcers.  Stable 1 cm mobile rounded lesion angle of the jaw on the right. Resp: Distant breath sounds. Cardio: Regular rate and rhythm. GI: Abdomen soft and nontender.  No hepatomegaly. Vascular: No leg edema. Port-A-Cath without erythema.  Lab Results:  Lab Results  Component Value Date   WBC 12.6 (H) 12/24/2019   HGB 15.3 12/24/2019   HCT 44.2 12/24/2019   MCV 98.0 12/24/2019   PLT 309 12/24/2019   NEUTROABS 9.6 (H) 12/24/2019    Imaging:  No results found.  Medications: I have reviewed the patient's current medications.  Assessment/Plan: 1. Pancreas mass, liver lesions  Abdominal ultrasound 09/03/2018-3 liver lesions. Intra-and extrahepatic biliary ductal dilatation.  Presentation to the emergency department 09/08/2018 with painless jaundice. Initial bilirubin returned at 18.3.   MRI abdomen 09/08/2018-1.9 x 2.6 cm hypoenhancing lesion arising exophytically along the posterior aspect of the pancreatic head suspicious for pancreatic adenocarcinoma. There was suspected involvement of the distal common duct with secondary intrahepatic/extrahepatic ductal dilatation. Lesion noted to abut the IVC. Multiple liver metastases noted. Small upper abdominal/retroperitoneal lymph nodes noted.   ERCP 09/09/2018-high-grade stricture and obstruction of the distal common bile  duct. A metallic biliary stent was placed. Brushings in the mid and lower third of the main bile duct were obtained with no malignant cells identified.   Bilirubin improved at 4.7 on 09/10/2018.   Biopsy of a liver lesion on 09/11/2018-metastatic carcinoma, cytokeratin 7 and TTF-1 positive, negative for cytokeratin 20, CDX 2, and PSA. Foundation 1-microsatellite stable, tumor mutation burden 16, ERBB2 amplification  CT chest 09/22/2018-right hilar/mediastinal/retrocrural lymphadenopathy, ill-defined right lower lobe nodularity, emphysema  Cycle 1 carboplatin/Alimta/pembrolizumab 10/09/2018  Cycle 2 carboplatin/Alimta/pembrolizumab 10/30/2018  Cycle 3 carboplatin/Alimta/pembrolizumab 11/20/2018  Cycle 4 carboplatin/Alimta/pembrolizumab 12/11/2018  CT 12/26/2018-marked response to therapy of the radical abdominal adenopathy, decreased size of hepatic metastases, no new or progressive disease  Cycle 5 Alimta/pembrolizumab 01/01/2019  Cycle 6 Alimta/pembrolizumab 01/23/2019  Cycle 7 Alimta/pembrolizumab 02/12/2019  Cycle 8 Alimta/pembrolizumab 03/05/2019  Cycle 9 Alimta/pembrolizumab 03/26/2019  CTs 04/14/2019-slight interval decrease in size and conspicuity of multiple, irregular low-attenuation liver lesions. No new liver lesions. No recurrent mediastinal or hilar lymphadenopathy. No change in mildly prominent subcentimeter periaortic and peripheral lymph nodes without evidence of recurrent abdominal or pelvic lymphadenopathy.  Cycle 10 Alimta/pembrolizumab 04/16/2019  Cycle 11 pembrolizumab 05/07/2019 (Alimta held secondary to increased tearing and leg edema)  Cycle 12 Alimta/pembrolizumab 05/28/2019  Cycle 13 Alimta/pembrolizumab 06/18/2019  Restaging CTs 07/08/2019-worsening of low-density hepatic lesions. Scattered small lymph nodes with increasing conspicuity, largest along the left periaortic chain approximately 9 mm previously approximately 5 mm.  Cycle 14  Alimta/pembrolizumab 07/09/2019  Cycle 15 Alimta/pembrolizumab 07/31/2019  Cycle 16 Alimta/pembrolizumab 08/20/2019  Rising CEA beginning December 2020  CTs 09/08/2019-enlargement of liver metastases, mild enlargement of abdominal/retroperitoneal and lower thoracic nodes, nonspecific pulmonary nodules-stable  Cycle 1 gemcitabine 09/17/2019  Cycle  2 gemcitabine 10/01/2019  Cycle 3 gemcitabine 10/15/2019  Cycle 4 gemcitabine 10/29/2019  Cycle 5 gemcitabine 11/12/2019  Rising CEA 10/2019  CTs 11/19/19 mixed response in liver, stable pulmonary nodules, thoracic and RP nodes, overall stable  Plan to intensify chemo carboplatin AUC 4 plus gemcitabine given on day 1 q21 days starting ~12/03/19  Admission 11/30/2019 with recurrent biliary obstruction-ERCP 12/02/2019-obstructed biliary stent, new stent placed  Cycle 1 gemcitabine/carboplatin 12/24/2019  Cycle 2 gemcitabine/carboplatin 01/14/2020 2. COPD 3. Hypertension 4. Trace edema right leg 12/11/2018. Doppler negative for DVT 12/12/2018. 5. Watery eyes, runny nose reported 12/11/2018. Question related to Alimta. Persistent watery eyes 04/16/2019. Ophthalmology evaluation 05/11/2019-blepharitis,ectropion left lower lid, macular degeneration 6. Admission 12/24/2018 with a fever-no source for infection identified, completed outpatient course of ciprofloxacin 7. Port-A-Cath placement 09/14/2019, interventional radiology 8. Colonoscopy 06/16/2015-diverticulosis in the sigmoid colon, examination otherwise normal   Disposition: Mr. Steelman appears stable.  He has completed 1 cycle of gemcitabine/carboplatin.  He tolerated well. Plan to proceed with cycle 2 today as scheduled.  He will be referred for restaging CTs after cycle 3.  We reviewed the CBC and chemistry panel from today.  Labs adequate to proceed with treatment.  Transaminases mildly elevated.  Bilirubin is within normal range.  We will continue to monitor.  He will return for lab, follow-up,  cycle 3 gemcitabine/carboplatin in 3 weeks.    Ned Card ANP/GNP-BC   01/14/2020  12:17 PM

## 2020-01-15 ENCOUNTER — Telehealth: Payer: Self-pay | Admitting: Nurse Practitioner

## 2020-01-15 NOTE — Telephone Encounter (Signed)
Scheduled per 7/22 los. Pt to get updated appt calendar at next visit per appt notes

## 2020-01-29 ENCOUNTER — Other Ambulatory Visit: Payer: Self-pay | Admitting: Oncology

## 2020-02-04 ENCOUNTER — Inpatient Hospital Stay: Payer: Medicare Other | Attending: Oncology

## 2020-02-04 ENCOUNTER — Telehealth: Payer: Self-pay | Admitting: Oncology

## 2020-02-04 ENCOUNTER — Other Ambulatory Visit: Payer: Self-pay

## 2020-02-04 ENCOUNTER — Inpatient Hospital Stay (HOSPITAL_BASED_OUTPATIENT_CLINIC_OR_DEPARTMENT_OTHER): Payer: Medicare Other | Admitting: Oncology

## 2020-02-04 ENCOUNTER — Inpatient Hospital Stay: Payer: Medicare Other

## 2020-02-04 VITALS — BP 131/75 | HR 78 | Temp 99.4°F | Resp 17 | Ht 71.0 in | Wt 167.2 lb

## 2020-02-04 DIAGNOSIS — Z5111 Encounter for antineoplastic chemotherapy: Secondary | ICD-10-CM | POA: Diagnosis present

## 2020-02-04 DIAGNOSIS — C787 Secondary malignant neoplasm of liver and intrahepatic bile duct: Secondary | ICD-10-CM | POA: Diagnosis present

## 2020-02-04 DIAGNOSIS — C3490 Malignant neoplasm of unspecified part of unspecified bronchus or lung: Secondary | ICD-10-CM

## 2020-02-04 DIAGNOSIS — C3431 Malignant neoplasm of lower lobe, right bronchus or lung: Secondary | ICD-10-CM | POA: Diagnosis present

## 2020-02-04 DIAGNOSIS — Z95828 Presence of other vascular implants and grafts: Secondary | ICD-10-CM

## 2020-02-04 LAB — CBC WITH DIFFERENTIAL (CANCER CENTER ONLY)
Abs Immature Granulocytes: 0.05 10*3/uL (ref 0.00–0.07)
Basophils Absolute: 0.1 10*3/uL (ref 0.0–0.1)
Basophils Relative: 1 %
Eosinophils Absolute: 0.1 10*3/uL (ref 0.0–0.5)
Eosinophils Relative: 1 %
HCT: 42.4 % (ref 39.0–52.0)
Hemoglobin: 14.5 g/dL (ref 13.0–17.0)
Immature Granulocytes: 1 %
Lymphocytes Relative: 17 %
Lymphs Abs: 1.1 10*3/uL (ref 0.7–4.0)
MCH: 33.6 pg (ref 26.0–34.0)
MCHC: 34.2 g/dL (ref 30.0–36.0)
MCV: 98.1 fL (ref 80.0–100.0)
Monocytes Absolute: 1.5 10*3/uL — ABNORMAL HIGH (ref 0.1–1.0)
Monocytes Relative: 23 %
Neutro Abs: 3.8 10*3/uL (ref 1.7–7.7)
Neutrophils Relative %: 57 %
Platelet Count: 337 10*3/uL (ref 150–400)
RBC: 4.32 MIL/uL (ref 4.22–5.81)
RDW: 15.8 % — ABNORMAL HIGH (ref 11.5–15.5)
WBC Count: 6.6 10*3/uL (ref 4.0–10.5)
nRBC: 0 % (ref 0.0–0.2)

## 2020-02-04 LAB — CMP (CANCER CENTER ONLY)
ALT: 23 U/L (ref 0–44)
AST: 24 U/L (ref 15–41)
Albumin: 3.3 g/dL — ABNORMAL LOW (ref 3.5–5.0)
Alkaline Phosphatase: 123 U/L (ref 38–126)
Anion gap: 11 (ref 5–15)
BUN: 10 mg/dL (ref 8–23)
CO2: 27 mmol/L (ref 22–32)
Calcium: 9.5 mg/dL (ref 8.9–10.3)
Chloride: 97 mmol/L — ABNORMAL LOW (ref 98–111)
Creatinine: 0.65 mg/dL (ref 0.61–1.24)
GFR, Est AFR Am: >60 mL/min (ref >60)
GFR, Estimated: >60 mL/min (ref >60)
Glucose, Bld: 95 mg/dL (ref 70–99)
Potassium: 4 mmol/L (ref 3.5–5.1)
Sodium: 135 mmol/L (ref 135–145)
Total Bilirubin: 0.6 mg/dL (ref 0.3–1.2)
Total Protein: 7 g/dL (ref 6.5–8.1)

## 2020-02-04 LAB — CEA (IN HOUSE-CHCC): CEA (CHCC-In House): 3913.88 ng/mL — ABNORMAL HIGH (ref 0.00–5.00)

## 2020-02-04 MED ORDER — PALONOSETRON HCL INJECTION 0.25 MG/5ML
INTRAVENOUS | Status: AC
  Filled 2020-02-04: qty 5

## 2020-02-04 MED ORDER — FAMOTIDINE IN NACL 20-0.9 MG/50ML-% IV SOLN
INTRAVENOUS | Status: AC
  Filled 2020-02-04: qty 50

## 2020-02-04 MED ORDER — FAMOTIDINE IN NACL 20-0.9 MG/50ML-% IV SOLN
20.0000 mg | Freq: Once | INTRAVENOUS | Status: AC
  Administered 2020-02-04: 20 mg via INTRAVENOUS

## 2020-02-04 MED ORDER — HEPARIN SOD (PORK) LOCK FLUSH 100 UNIT/ML IV SOLN
500.0000 [IU] | Freq: Once | INTRAVENOUS | Status: AC | PRN
  Administered 2020-02-04: 500 [IU]
  Filled 2020-02-04: qty 5

## 2020-02-04 MED ORDER — SODIUM CHLORIDE 0.9 % IV SOLN
2000.0000 mg | Freq: Once | INTRAVENOUS | Status: AC
  Administered 2020-02-04: 2000 mg via INTRAVENOUS
  Filled 2020-02-04: qty 52.6

## 2020-02-04 MED ORDER — SODIUM CHLORIDE 0.9 % IV SOLN
10.0000 mg | Freq: Once | INTRAVENOUS | Status: AC
  Administered 2020-02-04: 10 mg via INTRAVENOUS
  Filled 2020-02-04: qty 10

## 2020-02-04 MED ORDER — DIPHENHYDRAMINE HCL 50 MG/ML IJ SOLN
INTRAMUSCULAR | Status: AC
  Filled 2020-02-04: qty 1

## 2020-02-04 MED ORDER — SODIUM CHLORIDE 0.9 % IV SOLN
392.4000 mg | Freq: Once | INTRAVENOUS | Status: AC
  Administered 2020-02-04: 390 mg via INTRAVENOUS
  Filled 2020-02-04: qty 39

## 2020-02-04 MED ORDER — SODIUM CHLORIDE 0.9% FLUSH
10.0000 mL | INTRAVENOUS | Status: DC | PRN
  Administered 2020-02-04: 10 mL
  Filled 2020-02-04: qty 10

## 2020-02-04 MED ORDER — PALONOSETRON HCL INJECTION 0.25 MG/5ML
0.2500 mg | Freq: Once | INTRAVENOUS | Status: AC
  Administered 2020-02-04: 0.25 mg via INTRAVENOUS

## 2020-02-04 MED ORDER — DIPHENHYDRAMINE HCL 50 MG/ML IJ SOLN
25.0000 mg | Freq: Once | INTRAMUSCULAR | Status: AC
  Administered 2020-02-04: 25 mg via INTRAVENOUS

## 2020-02-04 MED ORDER — SODIUM CHLORIDE 0.9 % IV SOLN
Freq: Once | INTRAVENOUS | Status: AC
  Filled 2020-02-04: qty 250

## 2020-02-04 MED ORDER — SODIUM CHLORIDE 0.9 % IV SOLN
150.0000 mg | Freq: Once | INTRAVENOUS | Status: AC
  Administered 2020-02-04: 150 mg via INTRAVENOUS
  Filled 2020-02-04: qty 150

## 2020-02-04 NOTE — Patient Instructions (Signed)
[facility] Discharge Instructions for Patients Receiving Chemotherapy  Today you received the following chemotherapy agents: Gemzar and Carboplatin  To help prevent nausea and vomiting after your treatment, we encourage you to take your nausea medication as prescribed.    If you develop nausea and vomiting that is not controlled by your nausea medication, call the clinic.   BELOW ARE SYMPTOMS THAT SHOULD BE REPORTED IMMEDIATELY:  *FEVER GREATER THAN 100.5 F  *CHILLS WITH OR WITHOUT FEVER  NAUSEA AND VOMITING THAT IS NOT CONTROLLED WITH YOUR NAUSEA MEDICATION  *UNUSUAL SHORTNESS OF BREATH  *UNUSUAL BRUISING OR BLEEDING  TENDERNESS IN MOUTH AND THROAT WITH OR WITHOUT PRESENCE OF ULCERS  *URINARY PROBLEMS  *BOWEL PROBLEMS  UNUSUAL RASH Items with * indicate a potential emergency and should be followed up as soon as possible.  Feel free to call the clinic should you have any questions or concerns. The clinic phone number is [phone].  Please show the [facility] at check-in to the Emergency Department and triage nurse.

## 2020-02-04 NOTE — Progress Notes (Signed)
[place] OFFICE PROGRESS NOTE   Diagnosis: Non-small cell lung cancer  INTERVAL HISTORY:   Ricky Conway complete another cycle of gemcitabine/carboplatin on 01/14/2020.  He reports feeling well.  Stable dyspnea.  No new complaint.  Good appetite.  Objective:  Vital signs in last 24 hours:  Blood pressure 131/75, pulse 78, temperature 99.4 F (37.4 C), temperature source Tympanic, resp. rate 17, height '5\' 11"'  (1.803 m), weight 167 lb 3.2 oz (75.8 kg), SpO2 96 %.     Resp: Distant breath sounds, no respiratory distress Cardio: Regular rate and rhythm GI: No hepatomegaly, nontender Vascular: The right lower leg is slightly larger than the left side, no edema  Skin: Dryness of the lower legs with mild erythema bilaterally  Portacath/PICC-without erythema  Lab Results:  Lab Results  Component Value Date   WBC 6.6 02/04/2020   HGB 14.5 02/04/2020   HCT 42.4 02/04/2020   MCV 98.1 02/04/2020   PLT 337 02/04/2020   NEUTROABS 3.8 02/04/2020    CMP  Lab Results  Component Value Date   NA 135 02/04/2020   K 4.0 02/04/2020   CL 97 (L) 02/04/2020   CO2 27 02/04/2020   GLUCOSE 95 02/04/2020   BUN 10 02/04/2020   CREATININE 0.65 02/04/2020   CALCIUM 9.5 02/04/2020   PROT 7.0 02/04/2020   ALBUMIN 3.3 (L) 02/04/2020   AST 24 02/04/2020   ALT 23 02/04/2020   ALKPHOS 123 02/04/2020   BILITOT 0.6 02/04/2020   GFRNONAA >60 02/04/2020   GFRAA >60 02/04/2020    Lab Results  Component Value Date   CEA1 4,759.89 (H) 01/14/2020     Medications: I have reviewed the patient's current medications.   Assessment/Plan:  1. Pancreas mass, liver lesions  Abdominal ultrasound 09/03/2018-3 liver lesions. Intra-and extrahepatic biliary ductal dilatation.  Presentation to the emergency department 09/08/2018 with painless jaundice. Initial bilirubin returned at 18.3.   MRI abdomen 09/08/2018-1.9 x 2.6 cm hypoenhancing lesion arising exophytically along the  posterior aspect of the pancreatic head suspicious for pancreatic adenocarcinoma. There was suspected involvement of the distal common duct with secondary intrahepatic/extrahepatic ductal dilatation. Lesion noted to abut the IVC. Multiple liver metastases noted. Small upper abdominal/retroperitoneal lymph nodes noted.   ERCP 09/09/2018-high-grade stricture and obstruction of the distal common bile duct. A metallic biliary stent was placed. Brushings in the mid and lower third of the main bile duct were obtained with no malignant cells identified.   Bilirubin improved at 4.7 on 09/10/2018.   Biopsy of a liver lesion on 09/11/2018-metastatic carcinoma, cytokeratin 7 and TTF-1 positive, negative for cytokeratin 20, CDX 2, and PSA. Foundation 1-microsatellite stable, tumor mutation burden 16, ERBB2 amplification  CT chest 09/22/2018-right hilar/mediastinal/retrocrural lymphadenopathy, ill-defined right lower lobe nodularity, emphysema  Cycle 1 carboplatin/Alimta/pembrolizumab 10/09/2018  Cycle 2 carboplatin/Alimta/pembrolizumab 10/30/2018  Cycle 3 carboplatin/Alimta/pembrolizumab 11/20/2018  Cycle 4 carboplatin/Alimta/pembrolizumab 12/11/2018  CT 12/26/2018-marked response to therapy of the radical abdominal adenopathy, decreased size of hepatic metastases, no new or progressive disease  Cycle 5 Alimta/pembrolizumab 01/01/2019  Cycle 6 Alimta/pembrolizumab 01/23/2019  Cycle 7 Alimta/pembrolizumab 02/12/2019  Cycle 8 Alimta/pembrolizumab 03/05/2019  Cycle 9 Alimta/pembrolizumab 03/26/2019  CTs 04/14/2019-slight interval decrease in size and conspicuity of multiple, irregular low-attenuation liver lesions. No new liver lesions. No recurrent mediastinal or hilar lymphadenopathy. No change in mildly prominent subcentimeter periaortic and peripheral lymph nodes without evidence of recurrent abdominal or pelvic lymphadenopathy.  Cycle 10 Alimta/pembrolizumab 04/16/2019  Cycle 11 pembrolizumab  05/07/2019 (Alimta held secondary to increased tearing and  leg edema)  Cycle 12 Alimta/pembrolizumab 05/28/2019  Cycle 13 Alimta/pembrolizumab 06/18/2019  Restaging CTs 07/08/2019-worsening of low-density hepatic lesions. Scattered small lymph nodes with increasing conspicuity, largest along the left periaortic chain approximately 9 mm previously approximately 5 mm.  Cycle 14 Alimta/pembrolizumab 07/09/2019  Cycle 15 Alimta/pembrolizumab 07/31/2019  Cycle 16 Alimta/pembrolizumab 08/20/2019  Rising CEA beginning December 2020  CTs 09/08/2019-enlargement of liver metastases, mild enlargement of abdominal/retroperitoneal and lower thoracic nodes, nonspecific pulmonary nodules-stable  Cycle 1 gemcitabine 09/17/2019  Cycle 2 gemcitabine 10/01/2019  Cycle 3 gemcitabine 10/15/2019  Cycle 4 gemcitabine 10/29/2019  Cycle 5 gemcitabine 11/12/2019  Rising CEA 10/2019  CTs 11/19/19 mixed response in liver, stable pulmonary nodules, thoracic and RP nodes, overall stable  Plan to intensify chemo carboplatin AUC 4 plus gemcitabine given on day 1 q21 days starting ~12/03/19  Admission 11/30/2019 with recurrent biliary obstruction-ERCP 12/02/2019-obstructed biliary stent, new stent placed  Cycle 1 gemcitabine/carboplatin 12/24/2019  Cycle 2 gemcitabine/carboplatin 01/14/2020  Cycle 3 gemcitabine/carboplatin 02/04/2020 2. COPD 3. Hypertension 4. Trace edema right leg 12/11/2018. Doppler negative for DVT 12/12/2018. 5. Watery eyes, runny nose reported 12/11/2018. Question related to Alimta. Persistent watery eyes 04/16/2019. Ophthalmology evaluation 05/11/2019-blepharitis,ectropion left lower lid, macular degeneration 6. Admission 12/24/2018 with a fever-no source for infection identified, completed outpatient course of ciprofloxacin 7. Port-A-Cath placement 09/14/2019, interventional radiology 8. Colonoscopy 06/16/2015-diverticulosis in the sigmoid colon, examination otherwise normal   Disposition: Mr.  Conway appears stable.  He is tolerating the gemcitabine/carboplatin well.  He will complete cycle 3 today.  He will be referred for restaging CTs after cycle 4.  He will return for an office visit and chemotherapy in 3 weeks.  [name], MD  02/04/2020  10:53 AM

## 2020-02-04 NOTE — Telephone Encounter (Signed)
Scheduled appointments per 8/12 los. Patient is aware of appointments dates and times.

## 2020-02-21 ENCOUNTER — Other Ambulatory Visit: Payer: Self-pay | Admitting: Oncology

## 2020-02-25 ENCOUNTER — Inpatient Hospital Stay: Payer: Medicare Other

## 2020-02-25 ENCOUNTER — Inpatient Hospital Stay (HOSPITAL_BASED_OUTPATIENT_CLINIC_OR_DEPARTMENT_OTHER): Payer: Medicare Other | Admitting: Oncology

## 2020-02-25 ENCOUNTER — Telehealth: Payer: Self-pay

## 2020-02-25 ENCOUNTER — Inpatient Hospital Stay: Payer: Medicare Other | Attending: Oncology

## 2020-02-25 ENCOUNTER — Other Ambulatory Visit: Payer: Self-pay

## 2020-02-25 VITALS — BP 156/77 | HR 79 | Temp 98.5°F | Resp 17 | Ht 71.0 in | Wt 170.4 lb

## 2020-02-25 DIAGNOSIS — C3431 Malignant neoplasm of lower lobe, right bronchus or lung: Secondary | ICD-10-CM | POA: Insufficient documentation

## 2020-02-25 DIAGNOSIS — C3490 Malignant neoplasm of unspecified part of unspecified bronchus or lung: Secondary | ICD-10-CM

## 2020-02-25 DIAGNOSIS — Z5111 Encounter for antineoplastic chemotherapy: Secondary | ICD-10-CM | POA: Diagnosis present

## 2020-02-25 DIAGNOSIS — C787 Secondary malignant neoplasm of liver and intrahepatic bile duct: Secondary | ICD-10-CM | POA: Diagnosis present

## 2020-02-25 DIAGNOSIS — Z95828 Presence of other vascular implants and grafts: Secondary | ICD-10-CM

## 2020-02-25 LAB — CBC WITH DIFFERENTIAL (CANCER CENTER ONLY)
Abs Immature Granulocytes: 0.04 10*3/uL (ref 0.00–0.07)
Basophils Absolute: 0.1 10*3/uL (ref 0.0–0.1)
Basophils Relative: 1 %
Eosinophils Absolute: 0.1 10*3/uL (ref 0.0–0.5)
Eosinophils Relative: 2 %
HCT: 42.6 % (ref 39.0–52.0)
Hemoglobin: 14.6 g/dL (ref 13.0–17.0)
Immature Granulocytes: 1 %
Lymphocytes Relative: 17 %
Lymphs Abs: 0.9 10*3/uL (ref 0.7–4.0)
MCH: 33 pg (ref 26.0–34.0)
MCHC: 34.3 g/dL (ref 30.0–36.0)
MCV: 96.2 fL (ref 80.0–100.0)
Monocytes Absolute: 1.3 10*3/uL — ABNORMAL HIGH (ref 0.1–1.0)
Monocytes Relative: 24 %
Neutro Abs: 3 10*3/uL (ref 1.7–7.7)
Neutrophils Relative %: 55 %
Platelet Count: 315 10*3/uL (ref 150–400)
RBC: 4.43 MIL/uL (ref 4.22–5.81)
RDW: 16.4 % — ABNORMAL HIGH (ref 11.5–15.5)
WBC Count: 5.4 10*3/uL (ref 4.0–10.5)
nRBC: 0 % (ref 0.0–0.2)

## 2020-02-25 LAB — CMP (CANCER CENTER ONLY)
ALT: 29 U/L (ref 0–44)
AST: 29 U/L (ref 15–41)
Albumin: 3.4 g/dL — ABNORMAL LOW (ref 3.5–5.0)
Alkaline Phosphatase: 112 U/L (ref 38–126)
Anion gap: 10 (ref 5–15)
BUN: 8 mg/dL (ref 8–23)
CO2: 31 mmol/L (ref 22–32)
Calcium: 9.7 mg/dL (ref 8.9–10.3)
Chloride: 96 mmol/L — ABNORMAL LOW (ref 98–111)
Creatinine: 0.61 mg/dL (ref 0.61–1.24)
GFR, Est AFR Am: >60 mL/min (ref >60)
GFR, Estimated: >60 mL/min (ref >60)
Glucose, Bld: 106 mg/dL — ABNORMAL HIGH (ref 70–99)
Potassium: 3.7 mmol/L (ref 3.5–5.1)
Sodium: 137 mmol/L (ref 135–145)
Total Bilirubin: 0.5 mg/dL (ref 0.3–1.2)
Total Protein: 6.9 g/dL (ref 6.5–8.1)

## 2020-02-25 LAB — CEA (IN HOUSE-CHCC): CEA (CHCC-In House): 3663.02 ng/mL — ABNORMAL HIGH (ref 0.00–5.00)

## 2020-02-25 MED ORDER — SODIUM CHLORIDE 0.9 % IV SOLN
10.0000 mg | Freq: Once | INTRAVENOUS | Status: AC
  Administered 2020-02-25: 10 mg via INTRAVENOUS
  Filled 2020-02-25: qty 10

## 2020-02-25 MED ORDER — PALONOSETRON HCL INJECTION 0.25 MG/5ML
0.2500 mg | Freq: Once | INTRAVENOUS | Status: AC
  Administered 2020-02-25: 0.25 mg via INTRAVENOUS

## 2020-02-25 MED ORDER — SODIUM CHLORIDE 0.9 % IV SOLN
2000.0000 mg | Freq: Once | INTRAVENOUS | Status: AC
  Administered 2020-02-25: 2000 mg via INTRAVENOUS
  Filled 2020-02-25: qty 52.6

## 2020-02-25 MED ORDER — SODIUM CHLORIDE 0.9 % IV SOLN
Freq: Once | INTRAVENOUS | Status: AC
  Filled 2020-02-25: qty 250

## 2020-02-25 MED ORDER — SODIUM CHLORIDE 0.9 % IV SOLN
392.4000 mg | Freq: Once | INTRAVENOUS | Status: AC
  Administered 2020-02-25: 390 mg via INTRAVENOUS
  Filled 2020-02-25: qty 39

## 2020-02-25 MED ORDER — DIPHENHYDRAMINE HCL 50 MG/ML IJ SOLN
25.0000 mg | Freq: Once | INTRAMUSCULAR | Status: AC
  Administered 2020-02-25: 25 mg via INTRAVENOUS

## 2020-02-25 MED ORDER — FAMOTIDINE IN NACL 20-0.9 MG/50ML-% IV SOLN
INTRAVENOUS | Status: AC
  Filled 2020-02-25: qty 50

## 2020-02-25 MED ORDER — FAMOTIDINE IN NACL 20-0.9 MG/50ML-% IV SOLN
20.0000 mg | Freq: Once | INTRAVENOUS | Status: AC
  Administered 2020-02-25: 20 mg via INTRAVENOUS

## 2020-02-25 MED ORDER — SODIUM CHLORIDE 0.9% FLUSH
10.0000 mL | INTRAVENOUS | Status: DC | PRN
  Administered 2020-02-25: 10 mL
  Filled 2020-02-25: qty 10

## 2020-02-25 MED ORDER — SODIUM CHLORIDE 0.9 % IV SOLN
150.0000 mg | Freq: Once | INTRAVENOUS | Status: AC
  Administered 2020-02-25: 150 mg via INTRAVENOUS
  Filled 2020-02-25: qty 150

## 2020-02-25 MED ORDER — PALONOSETRON HCL INJECTION 0.25 MG/5ML
INTRAVENOUS | Status: AC
  Filled 2020-02-25: qty 5

## 2020-02-25 MED ORDER — DIPHENHYDRAMINE HCL 50 MG/ML IJ SOLN
INTRAMUSCULAR | Status: AC
  Filled 2020-02-25: qty 1

## 2020-02-25 MED ORDER — HEPARIN SOD (PORK) LOCK FLUSH 100 UNIT/ML IV SOLN
500.0000 [IU] | Freq: Once | INTRAVENOUS | Status: AC | PRN
  Administered 2020-02-25: 500 [IU]
  Filled 2020-02-25: qty 5

## 2020-02-25 NOTE — Progress Notes (Signed)
[place] OFFICE PROGRESS NOTE   Diagnosis: Non-small cell lung cancer  INTERVAL HISTORY:   Ricky Conway completed another cycle of gemcitabine and carboplatin on 02/04/2020.  No nausea, rash, or fever.  Mild constipation.  He reports mild discomfort at the toes.  Objective:  Vital signs in last 24 hours:  Blood pressure (!) 156/77, pulse 79, temperature 98.5 F (36.9 C), temperature source Tympanic, resp. rate 17, height '5\' 11"'  (1.803 m), weight 170 lb 6.4 oz (77.3 kg), SpO2 90 %.    HEENT: No thrush or ulcers Resp: Distant breath sounds, no respiratory distress Cardio: Regular rate and rhythm GI: No hepatomegaly, nontender Vascular: No leg edema  Skin: Dryness at the soles of the feet and toes.  No skin breakdown.  Portacath/PICC-without erythema  Lab Results:  Lab Results  Component Value Date   WBC 5.4 02/25/2020   HGB 14.6 02/25/2020   HCT 42.6 02/25/2020   MCV 96.2 02/25/2020   PLT 315 02/25/2020   NEUTROABS 3.0 02/25/2020    CMP  Lab Results  Component Value Date   NA 137 02/25/2020   K 3.7 02/25/2020   CL 96 (L) 02/25/2020   CO2 31 02/25/2020   GLUCOSE 106 (H) 02/25/2020   BUN 8 02/25/2020   CREATININE 0.61 02/25/2020   CALCIUM 9.7 02/25/2020   PROT 6.9 02/25/2020   ALBUMIN 3.4 (L) 02/25/2020   AST 29 02/25/2020   ALT 29 02/25/2020   ALKPHOS 112 02/25/2020   BILITOT 0.5 02/25/2020   GFRNONAA >60 02/25/2020   GFRAA >60 02/25/2020    Lab Results  Component Value Date   CEA1 3,913.88 (H) 02/04/2020     Medications: I have reviewed the patient's current medications.   Assessment/Plan:  1. Pancreas mass, liver lesions  Abdominal ultrasound 09/03/2018-3 liver lesions. Intra-and extrahepatic biliary ductal dilatation.  Presentation to the emergency department 09/08/2018 with painless jaundice. Initial bilirubin returned at 18.3.   MRI abdomen 09/08/2018-1.9 x 2.6 cm hypoenhancing lesion arising exophytically along the  posterior aspect of the pancreatic head suspicious for pancreatic adenocarcinoma. There was suspected involvement of the distal common duct with secondary intrahepatic/extrahepatic ductal dilatation. Lesion noted to abut the IVC. Multiple liver metastases noted. Small upper abdominal/retroperitoneal lymph nodes noted.   ERCP 09/09/2018-high-grade stricture and obstruction of the distal common bile duct. A metallic biliary stent was placed. Brushings in the mid and lower third of the main bile duct were obtained with no malignant cells identified.   Bilirubin improved at 4.7 on 09/10/2018.   Biopsy of a liver lesion on 09/11/2018-metastatic carcinoma, cytokeratin 7 and TTF-1 positive, negative for cytokeratin 20, CDX 2, and PSA. Foundation 1-microsatellite stable, tumor mutation burden 16, ERBB2 amplification  CT chest 09/22/2018-right hilar/mediastinal/retrocrural lymphadenopathy, ill-defined right lower lobe nodularity, emphysema  Cycle 1 carboplatin/Alimta/pembrolizumab 10/09/2018  Cycle 2 carboplatin/Alimta/pembrolizumab 10/30/2018  Cycle 3 carboplatin/Alimta/pembrolizumab 11/20/2018  Cycle 4 carboplatin/Alimta/pembrolizumab 12/11/2018  CT 12/26/2018-marked response to therapy of the radical abdominal adenopathy, decreased size of hepatic metastases, no new or progressive disease  Cycle 5 Alimta/pembrolizumab 01/01/2019  Cycle 6 Alimta/pembrolizumab 01/23/2019  Cycle 7 Alimta/pembrolizumab 02/12/2019  Cycle 8 Alimta/pembrolizumab 03/05/2019  Cycle 9 Alimta/pembrolizumab 03/26/2019  CTs 04/14/2019-slight interval decrease in size and conspicuity of multiple, irregular low-attenuation liver lesions. No new liver lesions. No recurrent mediastinal or hilar lymphadenopathy. No change in mildly prominent subcentimeter periaortic and peripheral lymph nodes without evidence of recurrent abdominal or pelvic lymphadenopathy.  Cycle 10 Alimta/pembrolizumab 04/16/2019  Cycle 11 pembrolizumab  05/07/2019 (Alimta held secondary  to increased tearing and leg edema)  Cycle 12 Alimta/pembrolizumab 05/28/2019  Cycle 13 Alimta/pembrolizumab 06/18/2019  Restaging CTs 07/08/2019-worsening of low-density hepatic lesions. Scattered small lymph nodes with increasing conspicuity, largest along the left periaortic chain approximately 9 mm previously approximately 5 mm.  Cycle 14 Alimta/pembrolizumab 07/09/2019  Cycle 15 Alimta/pembrolizumab 07/31/2019  Cycle 16 Alimta/pembrolizumab 08/20/2019  Rising CEA beginning December 2020  CTs 09/08/2019-enlargement of liver metastases, mild enlargement of abdominal/retroperitoneal and lower thoracic nodes, nonspecific pulmonary nodules-stable  Cycle 1 gemcitabine 09/17/2019  Cycle 2 gemcitabine 10/01/2019  Cycle 3 gemcitabine 10/15/2019  Cycle 4 gemcitabine 10/29/2019  Cycle 5 gemcitabine 11/12/2019  Rising CEA 10/2019  CTs 11/19/19 mixed response in liver, stable pulmonary nodules, thoracic and RP nodes, overall stable  Plan to intensify chemo carboplatin AUC 4 plus gemcitabine given on day 1 q21 days starting ~12/03/19  Admission 11/30/2019 with recurrent biliary obstruction-ERCP 12/02/2019-obstructed biliary stent, new stent placed  Cycle 1 gemcitabine/carboplatin 12/24/2019  Cycle 2 gemcitabine/carboplatin 01/14/2020  Cycle 3 gemcitabine/carboplatin 02/04/2020  Cycle 4 gemcitabine/carboplatin 02/25/2020 2. COPD 3. Hypertension 4. Trace edema right leg 12/11/2018. Doppler negative for DVT 12/12/2018. 5. Watery eyes, runny nose reported 12/11/2018. Question related to Alimta. Persistent watery eyes 04/16/2019. Ophthalmology evaluation 05/11/2019-blepharitis,ectropion left lower lid, macular degeneration 6. Admission 12/24/2018 with a fever-no source for infection identified, completed outpatient course of ciprofloxacin 7. Port-A-Cath placement 09/14/2019, interventional radiology 8. Colonoscopy 06/16/2015-diverticulosis in the sigmoid colon, examination  otherwise normal   Disposition: Ricky Conway appears unchanged.  He will complete another cycle of gemcitabine/carboplatin today.  The CEA has been lower over the past 2 months.  We will follow up on the CEA from today.  He will undergo restaging CTs prior to an office visit in 3 weeks.  Ricky Conway has received the Covid booster vaccine.  [name], MD  02/25/2020  11:14 AM

## 2020-02-25 NOTE — Patient Instructions (Signed)
[facility] Discharge Instructions for Patients Receiving Chemotherapy  Today you received the following chemotherapy agents: gemcitabine and carboplatin.  To help prevent nausea and vomiting after your treatment, we encourage you to take your nausea medication as directed.   If you develop nausea and vomiting that is not controlled by your nausea medication, call the clinic.   BELOW ARE SYMPTOMS THAT SHOULD BE REPORTED IMMEDIATELY:  *FEVER GREATER THAN 100.5 F  *CHILLS WITH OR WITHOUT FEVER  NAUSEA AND VOMITING THAT IS NOT CONTROLLED WITH YOUR NAUSEA MEDICATION  *UNUSUAL SHORTNESS OF BREATH  *UNUSUAL BRUISING OR BLEEDING  TENDERNESS IN MOUTH AND THROAT WITH OR WITHOUT PRESENCE OF ULCERS  *URINARY PROBLEMS  *BOWEL PROBLEMS  UNUSUAL RASH Items with * indicate a potential emergency and should be followed up as soon as possible.  Feel free to call the clinic should you have any questions or concerns. The clinic phone number is [phone].  Please show the [place] at check-in to the Emergency Department and triage nurse.

## 2020-02-25 NOTE — Telephone Encounter (Signed)
-----   Message from Ladell Pier, MD sent at 02/25/2020  1:39 PM EDT ----- Please call patient, CEA is again lower, follow-up as scheduled

## 2020-02-25 NOTE — Telephone Encounter (Signed)
Spoke with pt made him aware of results pt very appreciative understands with no questions voiced pt encouraged for any questions concerns or changes

## 2020-02-26 ENCOUNTER — Telehealth: Payer: Self-pay | Admitting: Oncology

## 2020-02-26 NOTE — Telephone Encounter (Signed)
Scheduled appointments per 9/2 los. Patient is aware of appointment changes.

## 2020-03-12 ENCOUNTER — Other Ambulatory Visit: Payer: Self-pay | Admitting: Oncology

## 2020-03-15 ENCOUNTER — Other Ambulatory Visit: Payer: Medicare Other

## 2020-03-15 ENCOUNTER — Ambulatory Visit (HOSPITAL_COMMUNITY)
Admission: RE | Admit: 2020-03-15 | Discharge: 2020-03-15 | Disposition: A | Discharge status: Home / Self Care | Payer: Medicare Other | Source: Ambulatory Visit | Attending: Oncology | Admitting: Oncology

## 2020-03-15 ENCOUNTER — Inpatient Hospital Stay: Payer: Medicare Other

## 2020-03-15 ENCOUNTER — Other Ambulatory Visit: Payer: Self-pay

## 2020-03-15 ENCOUNTER — Encounter (HOSPITAL_COMMUNITY): Payer: Self-pay

## 2020-03-15 DIAGNOSIS — Z5111 Encounter for antineoplastic chemotherapy: Secondary | ICD-10-CM | POA: Diagnosis not present

## 2020-03-15 DIAGNOSIS — C3490 Malignant neoplasm of unspecified part of unspecified bronchus or lung: Secondary | ICD-10-CM

## 2020-03-15 LAB — CMP (CANCER CENTER ONLY)
ALT: 34 U/L (ref 0–44)
AST: 34 U/L (ref 15–41)
Albumin: 3.6 g/dL (ref 3.5–5.0)
Alkaline Phosphatase: 107 U/L (ref 38–126)
Anion gap: 8 (ref 5–15)
BUN: 12 mg/dL (ref 8–23)
CO2: 30 mmol/L (ref 22–32)
Calcium: 9.1 mg/dL (ref 8.9–10.3)
Chloride: 97 mmol/L — ABNORMAL LOW (ref 98–111)
Creatinine: 0.66 mg/dL (ref 0.61–1.24)
GFR, Est AFR Am: >60 mL/min (ref >60)
GFR, Estimated: >60 mL/min (ref >60)
Glucose, Bld: 97 mg/dL (ref 70–99)
Potassium: 3.8 mmol/L (ref 3.5–5.1)
Sodium: 135 mmol/L (ref 135–145)
Total Bilirubin: 0.5 mg/dL (ref 0.3–1.2)
Total Protein: 7.1 g/dL (ref 6.5–8.1)

## 2020-03-15 LAB — CBC WITH DIFFERENTIAL (CANCER CENTER ONLY)
Abs Immature Granulocytes: 0.04 10*3/uL (ref 0.00–0.07)
Basophils Absolute: 0 10*3/uL (ref 0.0–0.1)
Basophils Relative: 1 %
Eosinophils Absolute: 0.1 10*3/uL (ref 0.0–0.5)
Eosinophils Relative: 3 %
HCT: 41.9 % (ref 39.0–52.0)
Hemoglobin: 14.4 g/dL (ref 13.0–17.0)
Immature Granulocytes: 1 %
Lymphocytes Relative: 22 %
Lymphs Abs: 1.1 10*3/uL (ref 0.7–4.0)
MCH: 33.7 pg (ref 26.0–34.0)
MCHC: 34.4 g/dL (ref 30.0–36.0)
MCV: 98.1 fL (ref 80.0–100.0)
Monocytes Absolute: 1 10*3/uL (ref 0.1–1.0)
Monocytes Relative: 20 %
Neutro Abs: 2.8 10*3/uL (ref 1.7–7.7)
Neutrophils Relative %: 53 %
Platelet Count: 298 10*3/uL (ref 150–400)
RBC: 4.27 MIL/uL (ref 4.22–5.81)
RDW: 16.4 % — ABNORMAL HIGH (ref 11.5–15.5)
WBC Count: 5.1 10*3/uL (ref 4.0–10.5)
nRBC: 0 % (ref 0.0–0.2)

## 2020-03-15 LAB — CEA (IN HOUSE-CHCC): CEA (CHCC-In House): 3363.24 ng/mL — ABNORMAL HIGH (ref 0.00–5.00)

## 2020-03-15 MED ORDER — HEPARIN SOD (PORK) LOCK FLUSH 100 UNIT/ML IV SOLN
INTRAVENOUS | Status: AC
  Filled 2020-03-15: qty 5

## 2020-03-15 MED ORDER — HEPARIN SOD (PORK) LOCK FLUSH 100 UNIT/ML IV SOLN
500.0000 [IU] | Freq: Once | INTRAVENOUS | Status: AC
  Administered 2020-03-15: 500 [IU] via INTRAVENOUS

## 2020-03-15 MED ORDER — IOHEXOL 300 MG/ML  SOLN
100.0000 mL | Freq: Once | INTRAMUSCULAR | Status: AC | PRN
  Administered 2020-03-15: 100 mL via INTRAVENOUS

## 2020-03-17 ENCOUNTER — Other Ambulatory Visit: Payer: Medicare Other

## 2020-03-17 ENCOUNTER — Inpatient Hospital Stay: Payer: Medicare Other

## 2020-03-17 ENCOUNTER — Encounter: Payer: Self-pay | Admitting: Nurse Practitioner

## 2020-03-17 ENCOUNTER — Other Ambulatory Visit: Payer: Self-pay

## 2020-03-17 ENCOUNTER — Inpatient Hospital Stay (HOSPITAL_BASED_OUTPATIENT_CLINIC_OR_DEPARTMENT_OTHER): Payer: Medicare Other | Admitting: Nurse Practitioner

## 2020-03-17 VITALS — BP 152/81 | HR 77 | Temp 98.5°F | Resp 17 | Ht 71.0 in | Wt 173.8 lb

## 2020-03-17 DIAGNOSIS — Z95828 Presence of other vascular implants and grafts: Secondary | ICD-10-CM

## 2020-03-17 DIAGNOSIS — C3490 Malignant neoplasm of unspecified part of unspecified bronchus or lung: Secondary | ICD-10-CM | POA: Diagnosis not present

## 2020-03-17 DIAGNOSIS — Z5111 Encounter for antineoplastic chemotherapy: Secondary | ICD-10-CM | POA: Diagnosis not present

## 2020-03-17 MED ORDER — FAMOTIDINE IN NACL 20-0.9 MG/50ML-% IV SOLN
20.0000 mg | Freq: Once | INTRAVENOUS | Status: AC
  Administered 2020-03-17: 20 mg via INTRAVENOUS

## 2020-03-17 MED ORDER — SODIUM CHLORIDE 0.9% FLUSH
10.0000 mL | INTRAVENOUS | Status: DC | PRN
  Administered 2020-03-17: 10 mL
  Filled 2020-03-17: qty 10

## 2020-03-17 MED ORDER — DIPHENHYDRAMINE HCL 50 MG/ML IJ SOLN
25.0000 mg | Freq: Once | INTRAMUSCULAR | Status: AC
  Administered 2020-03-17: 25 mg via INTRAVENOUS

## 2020-03-17 MED ORDER — PALONOSETRON HCL INJECTION 0.25 MG/5ML
0.2500 mg | Freq: Once | INTRAVENOUS | Status: AC
  Administered 2020-03-17: 0.25 mg via INTRAVENOUS

## 2020-03-17 MED ORDER — SODIUM CHLORIDE 0.9 % IV SOLN
Freq: Once | INTRAVENOUS | Status: AC
  Filled 2020-03-17: qty 250

## 2020-03-17 MED ORDER — DIPHENHYDRAMINE HCL 50 MG/ML IJ SOLN
INTRAMUSCULAR | Status: AC
  Filled 2020-03-17: qty 1

## 2020-03-17 MED ORDER — SODIUM CHLORIDE 0.9 % IV SOLN
10.0000 mg | Freq: Once | INTRAVENOUS | Status: AC
  Administered 2020-03-17: 10 mg via INTRAVENOUS
  Filled 2020-03-17: qty 10

## 2020-03-17 MED ORDER — PALONOSETRON HCL INJECTION 0.25 MG/5ML
INTRAVENOUS | Status: AC
  Filled 2020-03-17: qty 5

## 2020-03-17 MED ORDER — HEPARIN SOD (PORK) LOCK FLUSH 100 UNIT/ML IV SOLN
500.0000 [IU] | Freq: Once | INTRAVENOUS | Status: AC | PRN
  Administered 2020-03-17: 500 [IU]
  Filled 2020-03-17: qty 5

## 2020-03-17 MED ORDER — SODIUM CHLORIDE 0.9 % IV SOLN
2000.0000 mg | Freq: Once | INTRAVENOUS | Status: AC
  Administered 2020-03-17: 2000 mg via INTRAVENOUS
  Filled 2020-03-17: qty 52.6

## 2020-03-17 MED ORDER — SODIUM CHLORIDE 0.9 % IV SOLN
150.0000 mg | Freq: Once | INTRAVENOUS | Status: AC
  Administered 2020-03-17: 150 mg via INTRAVENOUS
  Filled 2020-03-17: qty 150

## 2020-03-17 MED ORDER — SODIUM CHLORIDE 0.9 % IV SOLN
392.4000 mg | Freq: Once | INTRAVENOUS | Status: AC
  Administered 2020-03-17: 390 mg via INTRAVENOUS
  Filled 2020-03-17: qty 39

## 2020-03-17 MED ORDER — FAMOTIDINE IN NACL 20-0.9 MG/50ML-% IV SOLN
INTRAVENOUS | Status: AC
  Filled 2020-03-17: qty 50

## 2020-03-17 NOTE — Patient Instructions (Signed)
[facility] Discharge Instructions for Patients Receiving Chemotherapy  Today you received the following chemotherapy agents: gemcitabine and carboplatin.  To help prevent nausea and vomiting after your treatment, we encourage you to take your nausea medication as directed.   If you develop nausea and vomiting that is not controlled by your nausea medication, call the clinic.   BELOW ARE SYMPTOMS THAT SHOULD BE REPORTED IMMEDIATELY:  *FEVER GREATER THAN 100.5 F  *CHILLS WITH OR WITHOUT FEVER  NAUSEA AND VOMITING THAT IS NOT CONTROLLED WITH YOUR NAUSEA MEDICATION  *UNUSUAL SHORTNESS OF BREATH  *UNUSUAL BRUISING OR BLEEDING  TENDERNESS IN MOUTH AND THROAT WITH OR WITHOUT PRESENCE OF ULCERS  *URINARY PROBLEMS  *BOWEL PROBLEMS  UNUSUAL RASH Items with * indicate a potential emergency and should be followed up as soon as possible.  Feel free to call the clinic should you have any questions or concerns. The clinic phone number is [phone].  Please show the [place] at check-in to the Emergency Department and triage nurse.

## 2020-03-17 NOTE — Progress Notes (Addendum)
[place] OFFICE PROGRESS NOTE   Diagnosis: Non-small cell lung cancer  INTERVAL HISTORY:   Ricky Conway returns as scheduled. He completed cycle 4 gemcitabine/carboplatin on 02/25/2020.  Denies nausea/vomiting.  No mouth sores.  No diarrhea.  Intermittent constipation.  No fever or rash after treatment.  No change in baseline dyspnea, cough.  He denies pain, specifically no back pain.  Objective:  Vital signs in last 24 hours:  Blood pressure (!) 152/81, pulse 77, temperature 98.5 F (36.9 C), temperature source Tympanic, resp. rate 17, height '5\' 11"'  (1.803 m), weight 173 lb 12.8 oz (78.8 kg), SpO2 96 %.    HEENT: No thrush or ulcers. Resp: Lungs clear bilaterally. Cardio: Regular rate and rhythm. GI: Abdomen soft and nontender.  No hepatomegaly. Vascular: No leg edema. Neuro: Alert and oriented. Port-A-Cath without erythema.  Lab Results:  Lab Results  Component Value Date   WBC 5.1 03/15/2020   HGB 14.4 03/15/2020   HCT 41.9 03/15/2020   MCV 98.1 03/15/2020   PLT 298 03/15/2020   NEUTROABS 2.8 03/15/2020    Imaging:  No results found.  Medications: I have reviewed the patient's current medications.  Assessment/Plan: 1. Pancreas mass, liver lesions  Abdominal ultrasound 09/03/2018-3 liver lesions. Intra-and extrahepatic biliary ductal dilatation.  Presentation to the emergency department 09/08/2018 with painless jaundice. Initial bilirubin returned at 18.3.   MRI abdomen 09/08/2018-1.9 x 2.6 cm hypoenhancing lesion arising exophytically along the posterior aspect of the pancreatic head suspicious for pancreatic adenocarcinoma. There was suspected involvement of the distal common duct with secondary intrahepatic/extrahepatic ductal dilatation. Lesion noted to abut the IVC. Multiple liver metastases noted. Small upper abdominal/retroperitoneal lymph nodes noted.   ERCP 09/09/2018-high-grade stricture and obstruction of the distal common bile  duct. A metallic biliary stent was placed. Brushings in the mid and lower third of the main bile duct were obtained with no malignant cells identified.   Bilirubin improved at 4.7 on 09/10/2018.   Biopsy of a liver lesion on 09/11/2018-metastatic carcinoma, cytokeratin 7 and TTF-1 positive, negative for cytokeratin 20, CDX 2, and PSA. Foundation 1-microsatellite stable, tumor mutation burden 16, ERBB2 amplification  CT chest 09/22/2018-right hilar/mediastinal/retrocrural lymphadenopathy, ill-defined right lower lobe nodularity, emphysema  Cycle 1 carboplatin/Alimta/pembrolizumab 10/09/2018  Cycle 2 carboplatin/Alimta/pembrolizumab 10/30/2018  Cycle 3 carboplatin/Alimta/pembrolizumab 11/20/2018  Cycle 4 carboplatin/Alimta/pembrolizumab 12/11/2018  CT 12/26/2018-marked response to therapy of the radical abdominal adenopathy, decreased size of hepatic metastases, no new or progressive disease  Cycle 5 Alimta/pembrolizumab 01/01/2019  Cycle 6 Alimta/pembrolizumab 01/23/2019  Cycle 7 Alimta/pembrolizumab 02/12/2019  Cycle 8 Alimta/pembrolizumab 03/05/2019  Cycle 9 Alimta/pembrolizumab 03/26/2019  CTs 04/14/2019-slight interval decrease in size and conspicuity of multiple, irregular low-attenuation liver lesions. No new liver lesions. No recurrent mediastinal or hilar lymphadenopathy. No change in mildly prominent subcentimeter periaortic and peripheral lymph nodes without evidence of recurrent abdominal or pelvic lymphadenopathy.  Cycle 10 Alimta/pembrolizumab 04/16/2019  Cycle 11 pembrolizumab 05/07/2019 (Alimta held secondary to increased tearing and leg edema)  Cycle 12 Alimta/pembrolizumab 05/28/2019  Cycle 13 Alimta/pembrolizumab 06/18/2019  Restaging CTs 07/08/2019-worsening of low-density hepatic lesions. Scattered small lymph nodes with increasing conspicuity, largest along the left periaortic chain approximately 9 mm previously approximately 5 mm.  Cycle 14  Alimta/pembrolizumab 07/09/2019  Cycle 15 Alimta/pembrolizumab 07/31/2019  Cycle 16 Alimta/pembrolizumab 08/20/2019  Rising CEA beginning December 2020  CTs 09/08/2019-enlargement of liver metastases, mild enlargement of abdominal/retroperitoneal and lower thoracic nodes, nonspecific pulmonary nodules-stable  Cycle 1 gemcitabine 09/17/2019  Cycle 2 gemcitabine 10/01/2019  Cycle 3 gemcitabine 10/15/2019  Cycle 4 gemcitabine 10/29/2019  Cycle 5 gemcitabine 11/12/2019  Rising CEA 10/2019  CTs 11/19/19 mixed response in liver, stable pulmonary nodules, thoracic and RP nodes, overall stable  Plan to intensify chemo carboplatin AUC 4 plus gemcitabine given on day 1 q21 days starting ~12/03/19  Admission 11/30/2019 with recurrent biliary obstruction-ERCP 12/02/2019-obstructed biliary stent, new stent placed  Cycle 1 gemcitabine/carboplatin 12/24/2019  Cycle 2 gemcitabine/carboplatin 01/14/2020  Cycle 3 gemcitabine/carboplatin 02/04/2020  Cycle 4 gemcitabine/carboplatin 02/25/2020  CTs 03/15/2020-decrease in size of hepatic metastatic lesions. No new liver lesions. New sclerotic lesions in the thoracic and lumbar spine. Stable small pulmonary nodules. Asymmetric wall thickening of the medial wall of the gallbladder new since previous exam without signs of inflammation.  Cycle 5 gemcitabine/carboplatin 03/17/2020 2. COPD 3. Hypertension 4. Trace edema right leg 12/11/2018. Doppler negative for DVT 12/12/2018. 5. Watery eyes, runny nose reported 12/11/2018. Question related to Alimta. Persistent watery eyes 04/16/2019. Ophthalmology evaluation 05/11/2019-blepharitis,ectropion left lower lid, macular degeneration 6. Admission 12/24/2018 with a fever-no source for infection identified, completed outpatient course of ciprofloxacin 7. Port-A-Cath placement 09/14/2019, interventional radiology 8. Colonoscopy 06/16/2015-diverticulosis in the sigmoid colon, examination otherwise normal  Disposition: Ricky Conway  appears stable.  He has completed 4 cycles of gemcitabine/carboplatin.  Recent restaging CTs show improvement in liver metastases and no new liver lesions.  New sclerotic lesions were seen in the thoracic and lumbar spine.  We reviewed the CT results/images with Ricky Conway at today's visit.  The "new" bone lesions may represent healing of pre-existing lesions.  CEA tumor marker continues to be improved.  He has a good performance status.  Dr. Benay Spice recommends continuation of gemcitabine/carboplatin on a 3-week schedule.  Ricky Conway agrees.  Plan to proceed with cycle 5 gemcitabine/carboplatin today as scheduled.  We reviewed the CBC and chemistry panel from 03/15/2020.  Labs adequate to proceed with treatment.  He will return for lab, follow-up, chemotherapy in 3 weeks.  Patient seen with Dr. Benay Spice.    Ned Card ANP/GNP-BC   03/17/2020  10:10 AM This was a shared visit with Ned Card.  We reviewed the restaging CT results and images with Ricky Conway.  There is clinical and radiologic evidence of improvement.  I suspect the "new "bone lesions are related to treated metastases.  The plan is to continue gemcitabine/carboplatin.  Julieanne Manson, MD

## 2020-03-18 ENCOUNTER — Telehealth: Payer: Self-pay | Admitting: Nurse Practitioner

## 2020-03-18 NOTE — Telephone Encounter (Signed)
Scheduled appointments per 9/23 los. Spoke to patient who is aware of appointments.

## 2020-03-22 ENCOUNTER — Other Ambulatory Visit: Payer: Self-pay | Admitting: Oncology

## 2020-04-02 ENCOUNTER — Other Ambulatory Visit: Payer: Self-pay | Admitting: Oncology

## 2020-04-07 ENCOUNTER — Inpatient Hospital Stay: Payer: Medicare Other

## 2020-04-07 ENCOUNTER — Other Ambulatory Visit: Payer: Medicare Other

## 2020-04-07 ENCOUNTER — Inpatient Hospital Stay (HOSPITAL_BASED_OUTPATIENT_CLINIC_OR_DEPARTMENT_OTHER): Payer: Medicare Other | Admitting: Nurse Practitioner

## 2020-04-07 ENCOUNTER — Ambulatory Visit: Payer: Medicare Other | Admitting: Oncology

## 2020-04-07 ENCOUNTER — Other Ambulatory Visit: Payer: Self-pay

## 2020-04-07 ENCOUNTER — Inpatient Hospital Stay: Payer: Medicare Other | Attending: Oncology

## 2020-04-07 ENCOUNTER — Ambulatory Visit: Payer: Medicare Other

## 2020-04-07 ENCOUNTER — Encounter: Payer: Self-pay | Admitting: Nurse Practitioner

## 2020-04-07 ENCOUNTER — Telehealth: Payer: Self-pay | Admitting: Oncology

## 2020-04-07 VITALS — BP 140/72 | HR 82 | Temp 98.6°F | Resp 17 | Ht 71.0 in | Wt 174.3 lb

## 2020-04-07 DIAGNOSIS — R7401 Elevation of levels of liver transaminase levels: Secondary | ICD-10-CM | POA: Insufficient documentation

## 2020-04-07 DIAGNOSIS — Z95828 Presence of other vascular implants and grafts: Secondary | ICD-10-CM

## 2020-04-07 DIAGNOSIS — C3431 Malignant neoplasm of lower lobe, right bronchus or lung: Secondary | ICD-10-CM | POA: Insufficient documentation

## 2020-04-07 DIAGNOSIS — C3490 Malignant neoplasm of unspecified part of unspecified bronchus or lung: Secondary | ICD-10-CM | POA: Diagnosis not present

## 2020-04-07 DIAGNOSIS — R748 Abnormal levels of other serum enzymes: Secondary | ICD-10-CM | POA: Insufficient documentation

## 2020-04-07 DIAGNOSIS — I10 Essential (primary) hypertension: Secondary | ICD-10-CM | POA: Insufficient documentation

## 2020-04-07 DIAGNOSIS — J449 Chronic obstructive pulmonary disease, unspecified: Secondary | ICD-10-CM | POA: Insufficient documentation

## 2020-04-07 DIAGNOSIS — Z5111 Encounter for antineoplastic chemotherapy: Secondary | ICD-10-CM | POA: Insufficient documentation

## 2020-04-07 DIAGNOSIS — C787 Secondary malignant neoplasm of liver and intrahepatic bile duct: Secondary | ICD-10-CM | POA: Insufficient documentation

## 2020-04-07 LAB — CBC WITH DIFFERENTIAL (CANCER CENTER ONLY)
Abs Immature Granulocytes: 0.06 10*3/uL (ref 0.00–0.07)
Basophils Absolute: 0 10*3/uL (ref 0.0–0.1)
Basophils Relative: 1 %
Eosinophils Absolute: 0.1 10*3/uL (ref 0.0–0.5)
Eosinophils Relative: 1 %
HCT: 40.1 % (ref 39.0–52.0)
Hemoglobin: 13.8 g/dL (ref 13.0–17.0)
Immature Granulocytes: 1 %
Lymphocytes Relative: 14 %
Lymphs Abs: 1 10*3/uL (ref 0.7–4.0)
MCH: 33.7 pg (ref 26.0–34.0)
MCHC: 34.4 g/dL (ref 30.0–36.0)
MCV: 97.8 fL (ref 80.0–100.0)
Monocytes Absolute: 1.8 10*3/uL — ABNORMAL HIGH (ref 0.1–1.0)
Monocytes Relative: 24 %
Neutro Abs: 4.6 10*3/uL (ref 1.7–7.7)
Neutrophils Relative %: 59 %
Platelet Count: 317 10*3/uL (ref 150–400)
RBC: 4.1 MIL/uL — ABNORMAL LOW (ref 4.22–5.81)
RDW: 16.2 % — ABNORMAL HIGH (ref 11.5–15.5)
WBC Count: 7.6 10*3/uL (ref 4.0–10.5)
nRBC: 0 % (ref 0.0–0.2)

## 2020-04-07 LAB — CMP (CANCER CENTER ONLY)
ALT: 131 U/L — ABNORMAL HIGH (ref 0–44)
AST: 169 U/L — ABNORMAL HIGH (ref 15–41)
Albumin: 3.1 g/dL — ABNORMAL LOW (ref 3.5–5.0)
Alkaline Phosphatase: 150 U/L — ABNORMAL HIGH (ref 38–126)
Anion gap: 4 — ABNORMAL LOW (ref 5–15)
BUN: 12 mg/dL (ref 8–23)
CO2: 33 mmol/L — ABNORMAL HIGH (ref 22–32)
Calcium: 9.5 mg/dL (ref 8.9–10.3)
Chloride: 96 mmol/L — ABNORMAL LOW (ref 98–111)
Creatinine: 0.67 mg/dL (ref 0.61–1.24)
GFR, Estimated: >60 mL/min (ref >60)
Glucose, Bld: 94 mg/dL (ref 70–99)
Potassium: 4 mmol/L (ref 3.5–5.1)
Sodium: 133 mmol/L — ABNORMAL LOW (ref 135–145)
Total Bilirubin: 0.5 mg/dL (ref 0.3–1.2)
Total Protein: 7 g/dL (ref 6.5–8.1)

## 2020-04-07 LAB — CEA (IN HOUSE-CHCC): CEA (CHCC-In House): 3051.63 ng/mL — ABNORMAL HIGH (ref 0.00–5.00)

## 2020-04-07 MED ORDER — SODIUM CHLORIDE 0.9% FLUSH
10.0000 mL | INTRAVENOUS | Status: DC | PRN
  Administered 2020-04-07: 10 mL
  Filled 2020-04-07: qty 10

## 2020-04-07 MED ORDER — HEPARIN SOD (PORK) LOCK FLUSH 100 UNIT/ML IV SOLN
500.0000 [IU] | Freq: Once | INTRAVENOUS | Status: AC | PRN
  Administered 2020-04-07: 500 [IU]
  Filled 2020-04-07: qty 5

## 2020-04-07 NOTE — Progress Notes (Signed)
[facility] OFFICE PROGRESS NOTE   Diagnosis: Non-small cell lung cancer  INTERVAL HISTORY:   Ricky Conway returns as scheduled.  He completed cycle 5 gemcitabine/carboplatin 03/17/2020.  He denies nausea/vomiting.  No mouth sores.  No diarrhea.  He does tend to get constipated after treatment.  He takes various laxatives with good results.  No rash or fever after treatment.  He thinks dyspnea may be slightly worse over the past 3 to 6 weeks.  No change in baseline cough.  No fever or chills.  Objective:  Vital signs in last 24 hours:  Blood pressure 140/72, pulse 82, temperature 98.6 F (37 C), temperature source Tympanic, resp. rate 17, height _0  (1.803 m), weight 174 lb 4.8 oz (79.1 kg), SpO2 92 %.    HEENT: No thrush or ulcers. Resp: Distant breath sounds. Cardio: Regular rate and rhythm. GI: Abdomen soft and nontender.  No hepatomegaly. Vascular: No leg edema. Neuro: Alert and oriented. Port-A-Cath without erythema.  Lab Results:  Lab Results  Component Value Date   WBC 7.6 04/07/2020   HGB 13.8 04/07/2020   HCT 40.1 04/07/2020   MCV 97.8 04/07/2020   PLT 317 04/07/2020   NEUTROABS 4.6 04/07/2020    Imaging:  No results found.  Medications: I have reviewed the patient's current medications.  Assessment/Plan: 1. Pancreas mass, liver lesions  Abdominal ultrasound 09/03/2018-3 liver lesions. Intra-and extrahepatic biliary ductal dilatation.  Presentation to the emergency department 09/08/2018 with painless jaundice. Initial bilirubin returned at 18.3.   MRI abdomen 09/08/2018-1.9 x 2.6 cm hypoenhancing lesion arising exophytically along the posterior aspect of the pancreatic head suspicious for pancreatic adenocarcinoma. There was suspected involvement of the distal common duct with secondary intrahepatic/extrahepatic ductal dilatation. Lesion noted to abut the IVC. Multiple liver metastases noted. Small upper abdominal/retroperitoneal lymph  nodes noted.   ERCP 09/09/2018-high-grade stricture and obstruction of the distal common bile duct. A metallic biliary stent was placed. Brushings in the mid and lower third of the main bile duct were obtained with no malignant cells identified.   Bilirubin improved at 4.7 on 09/10/2018.   Biopsy of a liver lesion on 09/11/2018-metastatic carcinoma, cytokeratin 7 and TTF-1 positive, negative for cytokeratin 20, CDX 2, and PSA. Foundation 1-microsatellite stable, tumor mutation burden 16, ERBB2 amplification  CT chest 09/22/2018-right hilar/mediastinal/retrocrural lymphadenopathy, ill-defined right lower lobe nodularity, emphysema  Cycle 1 carboplatin/Alimta/pembrolizumab 10/09/2018  Cycle 2 carboplatin/Alimta/pembrolizumab 10/30/2018  Cycle 3 carboplatin/Alimta/pembrolizumab 11/20/2018  Cycle 4 carboplatin/Alimta/pembrolizumab 12/11/2018  CT 12/26/2018-marked response to therapy of the radical abdominal adenopathy, decreased size of hepatic metastases, no new or progressive disease  Cycle 5 Alimta/pembrolizumab 01/01/2019  Cycle 6 Alimta/pembrolizumab 01/23/2019  Cycle 7 Alimta/pembrolizumab 02/12/2019  Cycle 8 Alimta/pembrolizumab 03/05/2019  Cycle 9 Alimta/pembrolizumab 03/26/2019  CTs 04/14/2019-slight interval decrease in size and conspicuity of multiple, irregular low-attenuation liver lesions. No new liver lesions. No recurrent mediastinal or hilar lymphadenopathy. No change in mildly prominent subcentimeter periaortic and peripheral lymph nodes without evidence of recurrent abdominal or pelvic lymphadenopathy.  Cycle 10 Alimta/pembrolizumab 04/16/2019  Cycle 11 pembrolizumab 05/07/2019 (Alimta held secondary to increased tearing and leg edema)  Cycle 12 Alimta/pembrolizumab 05/28/2019  Cycle 13 Alimta/pembrolizumab 06/18/2019  Restaging CTs 07/08/2019-worsening of low-density hepatic lesions. Scattered small lymph nodes with increasing conspicuity, largest along the left  periaortic chain approximately 9 mm previously approximately 5 mm.  Cycle 14 Alimta/pembrolizumab 07/09/2019  Cycle 15 Alimta/pembrolizumab 07/31/2019  Cycle 16 Alimta/pembrolizumab 08/20/2019  Rising CEA beginning December 2020  CTs 09/08/2019-enlargement of liver metastases, mild  enlargement of abdominal/retroperitoneal and lower thoracic nodes, nonspecific pulmonary nodules-stable  Cycle 1 gemcitabine 09/17/2019  Cycle 2 gemcitabine 10/01/2019  Cycle 3 gemcitabine 10/15/2019  Cycle 4 gemcitabine 10/29/2019  Cycle 5 gemcitabine 11/12/2019  Rising CEA 10/2019  CTs 11/19/19 mixed response in liver, stable pulmonary nodules, thoracic and RP nodes, overall stable  Plan to intensify chemo carboplatin AUC 4 plus gemcitabine given on day 1 q21 days starting ~12/03/19  Admission 11/30/2019 with recurrent biliary obstruction-ERCP 12/02/2019-obstructed biliary stent, new stent placed  Cycle 1 gemcitabine/carboplatin 12/24/2019  Cycle 2 gemcitabine/carboplatin 01/14/2020  Cycle 3 gemcitabine/carboplatin 02/04/2020  Cycle 4 gemcitabine/carboplatin 02/25/2020  CTs 03/15/2020-decrease in size of hepatic metastatic lesions. No new liver lesions. New sclerotic lesions in the thoracic and lumbar spine. Stable small pulmonary nodules. Asymmetric wall thickening of the medial wall of the gallbladder new since previous exam without signs of inflammation.  Cycle 5 gemcitabine/carboplatin 03/17/2020 2. COPD 3. Hypertension 4. Trace edema right leg 12/11/2018. Doppler negative for DVT 12/12/2018. 5. Watery eyes, runny nose reported 12/11/2018. Question related to Alimta. Persistent watery eyes 04/16/2019. Ophthalmology evaluation 05/11/2019-blepharitis,ectropion left lower lid, macular degeneration 6. Admission 12/24/2018 with a fever-no source for infection identified, completed outpatient course of ciprofloxacin 7. Port-A-Cath placement 09/14/2019, interventional radiology 8. Colonoscopy 06/16/2015-diverticulosis  in the sigmoid colon, examination otherwise normal   Disposition: Ricky Conway appears stable.  He has completed 5 cycles of gemcitabine/carboplatin.  AST/ALT and alkaline phosphatase unexpectedly elevated.  Total bilirubin is normal.  He has a biliary stent.  We are holding today's treatment.  We will refer him back to Dr. Watt Climes.  Plan repeat chemistry panel 04/11/2020, plan for CTs if further increase in liver enzymes.  He understands to contact the office with fever, chills, abdominal pain, jaundice.  We have tentatively rescheduled chemotherapy for 1 week.  We will see him in follow-up in 4 weeks, sooner if needed.  Patient seen with Dr. Benay Spice.    Ned Card ANP/GNP-BC   04/07/2020  1:10 PM

## 2020-04-07 NOTE — Telephone Encounter (Signed)
Scheduled appointments per 10/14 los. Spoke to patient who is aware of appointment date and time. Will have updated calendar printed out at patient's next appointment.

## 2020-04-08 ENCOUNTER — Telehealth: Payer: Self-pay | Admitting: Oncology

## 2020-04-08 ENCOUNTER — Encounter: Payer: Self-pay | Admitting: *Deleted

## 2020-04-08 NOTE — Telephone Encounter (Signed)
Scheduled per 10/14 los, patient has been called and notified of upcoming appointments.

## 2020-04-08 NOTE — Progress Notes (Signed)
Faxed referral order, demographics and chart info for urgent referral to Dr. Watt Climes at [facility] [phone].

## 2020-04-11 ENCOUNTER — Other Ambulatory Visit: Payer: Self-pay

## 2020-04-11 ENCOUNTER — Inpatient Hospital Stay: Payer: Medicare Other

## 2020-04-11 DIAGNOSIS — Z5111 Encounter for antineoplastic chemotherapy: Secondary | ICD-10-CM | POA: Diagnosis not present

## 2020-04-11 DIAGNOSIS — C3490 Malignant neoplasm of unspecified part of unspecified bronchus or lung: Secondary | ICD-10-CM

## 2020-04-11 DIAGNOSIS — Z95828 Presence of other vascular implants and grafts: Secondary | ICD-10-CM

## 2020-04-11 LAB — CMP (CANCER CENTER ONLY)
ALT: 48 U/L — ABNORMAL HIGH (ref 0–44)
AST: 30 U/L (ref 15–41)
Albumin: 3.2 g/dL — ABNORMAL LOW (ref 3.5–5.0)
Alkaline Phosphatase: 145 U/L — ABNORMAL HIGH (ref 38–126)
Anion gap: 4 — ABNORMAL LOW (ref 5–15)
BUN: 11 mg/dL (ref 8–23)
CO2: 33 mmol/L — ABNORMAL HIGH (ref 22–32)
Calcium: 9.5 mg/dL (ref 8.9–10.3)
Chloride: 98 mmol/L (ref 98–111)
Creatinine: 0.65 mg/dL (ref 0.61–1.24)
GFR, Estimated: >60 mL/min (ref >60)
Glucose, Bld: 93 mg/dL (ref 70–99)
Potassium: 4 mmol/L (ref 3.5–5.1)
Sodium: 135 mmol/L (ref 135–145)
Total Bilirubin: 0.3 mg/dL (ref 0.3–1.2)
Total Protein: 7.2 g/dL (ref 6.5–8.1)

## 2020-04-11 MED ORDER — SODIUM CHLORIDE 0.9% FLUSH
10.0000 mL | INTRAVENOUS | Status: DC | PRN
  Administered 2020-04-11: 10 mL
  Filled 2020-04-11: qty 10

## 2020-04-11 MED ORDER — HEPARIN SOD (PORK) LOCK FLUSH 100 UNIT/ML IV SOLN
500.0000 [IU] | Freq: Once | INTRAVENOUS | Status: AC | PRN
  Administered 2020-04-11: 500 [IU]
  Filled 2020-04-11: qty 5

## 2020-04-12 ENCOUNTER — Telehealth: Payer: Self-pay | Admitting: *Deleted

## 2020-04-12 NOTE — Telephone Encounter (Signed)
Called [facility] to F/U on referral placed 04/08/20. He is being seen tomorrow by Otila Kluver, [place].

## 2020-04-14 ENCOUNTER — Other Ambulatory Visit: Payer: Medicare Other

## 2020-04-14 ENCOUNTER — Ambulatory Visit: Payer: Medicare Other

## 2020-04-14 ENCOUNTER — Telehealth: Payer: Self-pay | Admitting: *Deleted

## 2020-04-14 NOTE — Telephone Encounter (Signed)
Informed wife that he does not need labs tomorrow since they were done on 10/14 and 10/18. OK to treat based on these results tomorrow per Ned Card, NP

## 2020-04-15 ENCOUNTER — Inpatient Hospital Stay: Payer: Medicare Other

## 2020-04-15 ENCOUNTER — Other Ambulatory Visit: Payer: Self-pay

## 2020-04-15 ENCOUNTER — Other Ambulatory Visit: Payer: Medicare Other

## 2020-04-15 VITALS — BP 149/81 | HR 73 | Temp 97.7°F | Resp 24 | Wt 174.5 lb

## 2020-04-15 DIAGNOSIS — Z5111 Encounter for antineoplastic chemotherapy: Secondary | ICD-10-CM | POA: Diagnosis not present

## 2020-04-15 DIAGNOSIS — C3490 Malignant neoplasm of unspecified part of unspecified bronchus or lung: Secondary | ICD-10-CM

## 2020-04-15 MED ORDER — SODIUM CHLORIDE 0.9% FLUSH
10.0000 mL | INTRAVENOUS | Status: DC | PRN
  Administered 2020-04-15: 10 mL
  Filled 2020-04-15: qty 10

## 2020-04-15 MED ORDER — DIPHENHYDRAMINE HCL 50 MG/ML IJ SOLN
25.0000 mg | Freq: Once | INTRAMUSCULAR | Status: AC
  Administered 2020-04-15: 25 mg via INTRAVENOUS

## 2020-04-15 MED ORDER — PALONOSETRON HCL INJECTION 0.25 MG/5ML
0.2500 mg | Freq: Once | INTRAVENOUS | Status: AC
  Administered 2020-04-15: 0.25 mg via INTRAVENOUS

## 2020-04-15 MED ORDER — SODIUM CHLORIDE 0.9 % IV SOLN
150.0000 mg | Freq: Once | INTRAVENOUS | Status: AC
  Administered 2020-04-15: 150 mg via INTRAVENOUS
  Filled 2020-04-15: qty 150
  Filled 2020-04-15: qty 5

## 2020-04-15 MED ORDER — DIPHENHYDRAMINE HCL 50 MG/ML IJ SOLN
INTRAMUSCULAR | Status: AC
  Filled 2020-04-15: qty 1

## 2020-04-15 MED ORDER — SODIUM CHLORIDE 0.9 % IV SOLN
392.4000 mg | Freq: Once | INTRAVENOUS | Status: AC
  Administered 2020-04-15: 390 mg via INTRAVENOUS
  Filled 2020-04-15: qty 39

## 2020-04-15 MED ORDER — FAMOTIDINE IN NACL 20-0.9 MG/50ML-% IV SOLN
20.0000 mg | Freq: Once | INTRAVENOUS | Status: AC
  Administered 2020-04-15: 20 mg via INTRAVENOUS

## 2020-04-15 MED ORDER — PALONOSETRON HCL INJECTION 0.25 MG/5ML
INTRAVENOUS | Status: AC
  Filled 2020-04-15: qty 5

## 2020-04-15 MED ORDER — SODIUM CHLORIDE 0.9 % IV SOLN
2000.0000 mg | Freq: Once | INTRAVENOUS | Status: AC
  Administered 2020-04-15: 2000 mg via INTRAVENOUS
  Filled 2020-04-15: qty 52.6

## 2020-04-15 MED ORDER — SODIUM CHLORIDE 0.9 % IV SOLN
10.0000 mg | Freq: Once | INTRAVENOUS | Status: AC
  Administered 2020-04-15: 10 mg via INTRAVENOUS
  Filled 2020-04-15: qty 10
  Filled 2020-04-15: qty 1

## 2020-04-15 MED ORDER — SODIUM CHLORIDE 0.9 % IV SOLN
Freq: Once | INTRAVENOUS | Status: AC
  Filled 2020-04-15: qty 250

## 2020-04-15 MED ORDER — FAMOTIDINE IN NACL 20-0.9 MG/50ML-% IV SOLN
INTRAVENOUS | Status: AC
  Filled 2020-04-15: qty 50

## 2020-04-15 MED ORDER — HEPARIN SOD (PORK) LOCK FLUSH 100 UNIT/ML IV SOLN
500.0000 [IU] | Freq: Once | INTRAVENOUS | Status: AC | PRN
  Administered 2020-04-15: 500 [IU]
  Filled 2020-04-15: qty 5

## 2020-04-15 NOTE — Patient Instructions (Signed)
[facility] Discharge Instructions for Patients Receiving Chemotherapy  Today you received the following chemotherapy agents: Carboplatin and Gemcitabine (Gemzar)  To help prevent nausea and vomiting after your treatment, we encourage you to take your nausea medication as directed by your MD.   If you develop nausea and vomiting that is not controlled by your nausea medication, call the clinic.   BELOW ARE SYMPTOMS THAT SHOULD BE REPORTED IMMEDIATELY:  *FEVER GREATER THAN 100.5 F  *CHILLS WITH OR WITHOUT FEVER  NAUSEA AND VOMITING THAT IS NOT CONTROLLED WITH YOUR NAUSEA MEDICATION  *UNUSUAL SHORTNESS OF BREATH  *UNUSUAL BRUISING OR BLEEDING  TENDERNESS IN MOUTH AND THROAT WITH OR WITHOUT PRESENCE OF ULCERS  *URINARY PROBLEMS  *BOWEL PROBLEMS  UNUSUAL RASH Items with * indicate a potential emergency and should be followed up as soon as possible.  Feel free to call the clinic should you have any questions or concerns. The clinic phone number is [phone].  Please show the [facility] at check-in to the Emergency Department and triage nurse.

## 2020-05-01 ENCOUNTER — Other Ambulatory Visit: Payer: Self-pay | Admitting: Oncology

## 2020-05-04 ENCOUNTER — Other Ambulatory Visit: Payer: Self-pay | Admitting: *Deleted

## 2020-05-04 DIAGNOSIS — C3490 Malignant neoplasm of unspecified part of unspecified bronchus or lung: Secondary | ICD-10-CM

## 2020-05-05 ENCOUNTER — Telehealth: Payer: Self-pay | Admitting: *Deleted

## 2020-05-05 ENCOUNTER — Inpatient Hospital Stay: Payer: Medicare Other

## 2020-05-05 ENCOUNTER — Inpatient Hospital Stay: Payer: Medicare Other | Admitting: Nurse Practitioner

## 2020-05-05 NOTE — Telephone Encounter (Signed)
Wife called to cancel appointments today--car has broken down and in shop--no transportation. Notified scheduler, Romelle Starcher to reschedule for next week.

## 2020-05-06 ENCOUNTER — Telehealth: Payer: Self-pay | Admitting: Nurse Practitioner

## 2020-05-06 NOTE — Telephone Encounter (Signed)
Appts. Rescheduled per 11/11 secure chat with RN Manuela Schwartz. Spoke to patient who is aware of appointment date and time.

## 2020-05-09 ENCOUNTER — Inpatient Hospital Stay (HOSPITAL_BASED_OUTPATIENT_CLINIC_OR_DEPARTMENT_OTHER): Payer: Medicare Other | Admitting: Nurse Practitioner

## 2020-05-09 ENCOUNTER — Inpatient Hospital Stay: Payer: Medicare Other

## 2020-05-09 ENCOUNTER — Inpatient Hospital Stay: Payer: Medicare Other | Attending: Oncology

## 2020-05-09 ENCOUNTER — Other Ambulatory Visit: Payer: Self-pay

## 2020-05-09 ENCOUNTER — Encounter: Payer: Self-pay | Admitting: Nurse Practitioner

## 2020-05-09 VITALS — BP 137/72 | HR 78 | Temp 98.1°F | Resp 20 | Ht 71.0 in | Wt 177.3 lb

## 2020-05-09 DIAGNOSIS — C787 Secondary malignant neoplasm of liver and intrahepatic bile duct: Secondary | ICD-10-CM | POA: Diagnosis present

## 2020-05-09 DIAGNOSIS — C3490 Malignant neoplasm of unspecified part of unspecified bronchus or lung: Secondary | ICD-10-CM

## 2020-05-09 DIAGNOSIS — Z95828 Presence of other vascular implants and grafts: Secondary | ICD-10-CM

## 2020-05-09 DIAGNOSIS — C3431 Malignant neoplasm of lower lobe, right bronchus or lung: Secondary | ICD-10-CM | POA: Diagnosis present

## 2020-05-09 DIAGNOSIS — Z5111 Encounter for antineoplastic chemotherapy: Secondary | ICD-10-CM | POA: Insufficient documentation

## 2020-05-09 LAB — CBC WITH DIFFERENTIAL (CANCER CENTER ONLY)
Abs Immature Granulocytes: 0.12 10*3/uL — ABNORMAL HIGH (ref 0.00–0.07)
Basophils Absolute: 0.1 10*3/uL (ref 0.0–0.1)
Basophils Relative: 1 %
Eosinophils Absolute: 0.1 10*3/uL (ref 0.0–0.5)
Eosinophils Relative: 1 %
HCT: 41.7 % (ref 39.0–52.0)
Hemoglobin: 14.1 g/dL (ref 13.0–17.0)
Immature Granulocytes: 1 %
Lymphocytes Relative: 13 %
Lymphs Abs: 1.2 10*3/uL (ref 0.7–4.0)
MCH: 33.2 pg (ref 26.0–34.0)
MCHC: 33.8 g/dL (ref 30.0–36.0)
MCV: 98.1 fL (ref 80.0–100.0)
Monocytes Absolute: 1.5 10*3/uL — ABNORMAL HIGH (ref 0.1–1.0)
Monocytes Relative: 16 %
Neutro Abs: 6.4 10*3/uL (ref 1.7–7.7)
Neutrophils Relative %: 68 %
Platelet Count: 312 10*3/uL (ref 150–400)
RBC: 4.25 MIL/uL (ref 4.22–5.81)
RDW: 16.1 % — ABNORMAL HIGH (ref 11.5–15.5)
WBC Count: 9.4 10*3/uL (ref 4.0–10.5)
nRBC: 0 % (ref 0.0–0.2)

## 2020-05-09 LAB — CMP (CANCER CENTER ONLY)
ALT: 92 U/L — ABNORMAL HIGH (ref 0–44)
AST: 58 U/L — ABNORMAL HIGH (ref 15–41)
Albumin: 3.4 g/dL — ABNORMAL LOW (ref 3.5–5.0)
Alkaline Phosphatase: 200 U/L — ABNORMAL HIGH (ref 38–126)
Anion gap: 8 (ref 5–15)
BUN: 13 mg/dL (ref 8–23)
CO2: 32 mmol/L (ref 22–32)
Calcium: 9.4 mg/dL (ref 8.9–10.3)
Chloride: 96 mmol/L — ABNORMAL LOW (ref 98–111)
Creatinine: 0.72 mg/dL (ref 0.61–1.24)
GFR, Estimated: >60 mL/min (ref >60)
Glucose, Bld: 108 mg/dL — ABNORMAL HIGH (ref 70–99)
Potassium: 3.8 mmol/L (ref 3.5–5.1)
Sodium: 136 mmol/L (ref 135–145)
Total Bilirubin: 0.7 mg/dL (ref 0.3–1.2)
Total Protein: 7.4 g/dL (ref 6.5–8.1)

## 2020-05-09 MED ORDER — SODIUM CHLORIDE 0.9 % IV SOLN
10.0000 mg | Freq: Once | INTRAVENOUS | Status: AC
  Administered 2020-05-09: 10 mg via INTRAVENOUS
  Filled 2020-05-09: qty 10
  Filled 2020-05-09: qty 1

## 2020-05-09 MED ORDER — HEPARIN SOD (PORK) LOCK FLUSH 100 UNIT/ML IV SOLN
500.0000 [IU] | Freq: Once | INTRAVENOUS | Status: AC | PRN
  Administered 2020-05-09: 500 [IU]
  Filled 2020-05-09: qty 5

## 2020-05-09 MED ORDER — SODIUM CHLORIDE 0.9% FLUSH
10.0000 mL | INTRAVENOUS | Status: DC | PRN
  Administered 2020-05-09: 10 mL
  Filled 2020-05-09: qty 10

## 2020-05-09 MED ORDER — SODIUM CHLORIDE 0.9 % IV SOLN
150.0000 mg | Freq: Once | INTRAVENOUS | Status: AC
  Administered 2020-05-09: 150 mg via INTRAVENOUS
  Filled 2020-05-09: qty 5
  Filled 2020-05-09: qty 150

## 2020-05-09 MED ORDER — DIPHENHYDRAMINE HCL 50 MG/ML IJ SOLN
25.0000 mg | Freq: Once | INTRAMUSCULAR | Status: AC
  Administered 2020-05-09: 25 mg via INTRAVENOUS

## 2020-05-09 MED ORDER — SODIUM CHLORIDE 0.9 % IV SOLN
392.4000 mg | Freq: Once | INTRAVENOUS | Status: AC
  Administered 2020-05-09: 390 mg via INTRAVENOUS
  Filled 2020-05-09: qty 39

## 2020-05-09 MED ORDER — PALONOSETRON HCL INJECTION 0.25 MG/5ML
0.2500 mg | Freq: Once | INTRAVENOUS | Status: AC
  Administered 2020-05-09: 0.25 mg via INTRAVENOUS

## 2020-05-09 MED ORDER — SODIUM CHLORIDE 0.9 % IV SOLN
1000.0000 mg/m2 | Freq: Once | INTRAVENOUS | Status: AC
  Administered 2020-05-09: 2014 mg via INTRAVENOUS
  Filled 2020-05-09: qty 52.97

## 2020-05-09 MED ORDER — SODIUM CHLORIDE 0.9 % IV SOLN
Freq: Once | INTRAVENOUS | Status: AC
  Filled 2020-05-09: qty 250

## 2020-05-09 MED ORDER — FAMOTIDINE IN NACL 20-0.9 MG/50ML-% IV SOLN
20.0000 mg | Freq: Once | INTRAVENOUS | Status: AC
  Administered 2020-05-09: 20 mg via INTRAVENOUS

## 2020-05-09 NOTE — Patient Instructions (Signed)
[facility] Discharge Instructions for Patients Receiving Chemotherapy  Today you received the following chemotherapy agents: Carboplatin and Gemcitabine (Gemzar)  To help prevent nausea and vomiting after your treatment, we encourage you to take your nausea medication as directed by your MD.   If you develop nausea and vomiting that is not controlled by your nausea medication, call the clinic.   BELOW ARE SYMPTOMS THAT SHOULD BE REPORTED IMMEDIATELY:  *FEVER GREATER THAN 100.5 F  *CHILLS WITH OR WITHOUT FEVER  NAUSEA AND VOMITING THAT IS NOT CONTROLLED WITH YOUR NAUSEA MEDICATION  *UNUSUAL SHORTNESS OF BREATH  *UNUSUAL BRUISING OR BLEEDING  TENDERNESS IN MOUTH AND THROAT WITH OR WITHOUT PRESENCE OF ULCERS  *URINARY PROBLEMS  *BOWEL PROBLEMS  UNUSUAL RASH Items with * indicate a potential emergency and should be followed up as soon as possible.  Feel free to call the clinic should you have any questions or concerns. The clinic phone number is [phone].  Please show the [facility] at check-in to the Emergency Department and triage nurse.

## 2020-05-09 NOTE — Progress Notes (Addendum)
[facility] OFFICE PROGRESS NOTE   Diagnosis: Non-small cell lung cancer  INTERVAL HISTORY:   Ricky Conway returns as scheduled.  He completed cycle 6 gemcitabine/carboplatin 04/15/2020.  He denies nausea/vomiting.  No mouth sores.  No change in bowel habits.  He has occasional constipation.  When shaving yesterday he noticed a rash on his forehead and around his mouth.  No associated pruritus.  He continues to have intermittent right shoulder pain.  This has been occurring for several months.  Objective:  Vital signs in last 24 hours:  Blood pressure 137/72, pulse 78, temperature 98.1 F (36.7 C), temperature source Tympanic, resp. rate 20, height 5' 11" (1.803 m), weight 177 lb 4.8 oz (80.4 kg), SpO2 91 %.    HEENT: No thrush or ulcers. Resp: Distant breath sounds.  No respiratory distress. Cardio: Regular rate and rhythm. GI: Abdomen soft and nontender.  No hepatomegaly. Vascular: No leg edema. Neuro: Alert and oriented. Skin: Erythematous rash at the forehead and perioral region.  No rash at the abdomen or back. Port-A-Cath without erythema.   Lab Results:  Lab Results  Component Value Date   WBC 9.4 05/09/2020   HGB 14.1 05/09/2020   HCT 41.7 05/09/2020   MCV 98.1 05/09/2020   PLT 312 05/09/2020   NEUTROABS 6.4 05/09/2020    Imaging:  No results found.  Medications: I have reviewed the patient's current medications.  Assessment/Plan: 1. Pancreas mass, liver lesions  Abdominal ultrasound 09/03/2018-3 liver lesions. Intra-and extrahepatic biliary ductal dilatation.  Presentation to the emergency department 09/08/2018 with painless jaundice. Initial bilirubin returned at 18.3.   MRI abdomen 09/08/2018-1.9 x 2.6 cm hypoenhancing lesion arising exophytically along the posterior aspect of the pancreatic head suspicious for pancreatic adenocarcinoma. There was suspected involvement of the distal common duct with secondary intrahepatic/extrahepatic  ductal dilatation. Lesion noted to abut the IVC. Multiple liver metastases noted. Small upper abdominal/retroperitoneal lymph nodes noted.   ERCP 09/09/2018-high-grade stricture and obstruction of the distal common bile duct. A metallic biliary stent was placed. Brushings in the mid and lower third of the main bile duct were obtained with no malignant cells identified.   Bilirubin improved at 4.7 on 09/10/2018.   Biopsy of a liver lesion on 09/11/2018-metastatic carcinoma, cytokeratin 7 and TTF-1 positive, negative for cytokeratin 20, CDX 2, and PSA. Foundation 1-microsatellite stable, tumor mutation burden 16, ERBB2 amplification  CT chest 09/22/2018-right hilar/mediastinal/retrocrural lymphadenopathy, ill-defined right lower lobe nodularity, emphysema  Cycle 1 carboplatin/Alimta/pembrolizumab 10/09/2018  Cycle 2 carboplatin/Alimta/pembrolizumab 10/30/2018  Cycle 3 carboplatin/Alimta/pembrolizumab 11/20/2018  Cycle 4 carboplatin/Alimta/pembrolizumab 12/11/2018  CT 12/26/2018-marked response to therapy of the radical abdominal adenopathy, decreased size of hepatic metastases, no new or progressive disease  Cycle 5 Alimta/pembrolizumab 01/01/2019  Cycle 6 Alimta/pembrolizumab 01/23/2019  Cycle 7 Alimta/pembrolizumab 02/12/2019  Cycle 8 Alimta/pembrolizumab 03/05/2019  Cycle 9 Alimta/pembrolizumab 03/26/2019  CTs 04/14/2019-slight interval decrease in size and conspicuity of multiple, irregular low-attenuation liver lesions. No new liver lesions. No recurrent mediastinal or hilar lymphadenopathy. No change in mildly prominent subcentimeter periaortic and peripheral lymph nodes without evidence of recurrent abdominal or pelvic lymphadenopathy.  Cycle 10 Alimta/pembrolizumab 04/16/2019  Cycle 11 pembrolizumab 05/07/2019 (Alimta held secondary to increased tearing and leg edema)  Cycle 12 Alimta/pembrolizumab 05/28/2019  Cycle 13 Alimta/pembrolizumab 06/18/2019  Restaging CTs  07/08/2019-worsening of low-density hepatic lesions. Scattered small lymph nodes with increasing conspicuity, largest along the left periaortic chain approximately 9 mm previously approximately 5 mm.  Cycle 14 Alimta/pembrolizumab 07/09/2019  Cycle 15 Alimta/pembrolizumab 07/31/2019  Cycle  16 Alimta/pembrolizumab 08/20/2019  Rising CEA beginning December 2020  CTs 09/08/2019-enlargement of liver metastases, mild enlargement of abdominal/retroperitoneal and lower thoracic nodes, nonspecific pulmonary nodules-stable  Cycle 1 gemcitabine 09/17/2019  Cycle 2 gemcitabine 10/01/2019  Cycle 3 gemcitabine 10/15/2019  Cycle 4 gemcitabine 10/29/2019  Cycle 5 gemcitabine 11/12/2019  Rising CEA 10/2019  CTs 11/19/19 mixed response in liver, stable pulmonary nodules, thoracic and RP nodes, overall stable  Plan to intensify chemo carboplatin AUC 4 plus gemcitabine given on day 1 q21 days starting ~12/03/19  Admission 11/30/2019 with recurrent biliary obstruction-ERCP 12/02/2019-obstructed biliary stent, new stent placed  Cycle 1 gemcitabine/carboplatin 12/24/2019  Cycle 2 gemcitabine/carboplatin 01/14/2020  Cycle 3 gemcitabine/carboplatin 02/04/2020  Cycle 4 gemcitabine/carboplatin 02/25/2020  CTs 03/15/2020-decrease in size of hepatic metastatic lesions. No new liver lesions. Newsclerotic lesions in the thoracic and lumbar spine. Stable small pulmonary nodules. Asymmetric wall thickening of the medial wall of the gallbladder new since previous exam without signs of inflammation.  Cycle5gemcitabine/carboplatin 03/17/2020  Cycle 6 gemcitabine/carboplatin 04/15/2020 2. COPD 3. Hypertension 4. Trace edema right leg 12/11/2018. Doppler negative for DVT 12/12/2018. 5. Watery eyes, runny nose reported 12/11/2018. Question related to Alimta. Persistent watery eyes 04/16/2019. Ophthalmology evaluation 05/11/2019-blepharitis,ectropion left lower lid, macular degeneration 6. Admission 12/24/2018 with a fever-no  source for infection identified, completed outpatient course of ciprofloxacin 7. Port-A-Cath placement 09/14/2019, interventional radiology 8. Colonoscopy 06/16/2015-diverticulosis in the sigmoid colon, examination otherwise normal   Disposition: Ricky Conway appears stable.  He has completed 6 cycles of gemcitabine/carboplatin.  Plan to proceed with cycle 7 today as scheduled.  He will be referred for restaging CT scans after he has completed 8 cycles.  We reviewed the CBC and chemistry panel from today.  Counts adequate to proceed with treatment.  Transaminases mildly elevated.  We will continue to monitor.  Etiology of facial rash is unclear, question contact dermatitis.  He will contact the office if the rash worsens.  He will return for lab, follow-up, cycle 8 gemcitabine/carboplatin in 3 weeks.  Patient seen with Dr. Benay Spice.    Ned Card ANP/GNP-BC   05/09/2020  9:38 AM This was a shared visit with Ned Card.  Ricky Conway was interviewed and examined.  His overall status appears unchanged.  The etiology of the facial rash is unclear.  I doubt this is related to chemotherapy.  He will call if the rash worsens.  Ricky Manson, MD

## 2020-05-10 ENCOUNTER — Telehealth: Payer: Self-pay | Admitting: Nurse Practitioner

## 2020-05-10 NOTE — Telephone Encounter (Signed)
Scheduled appointments per 11/15 los. Spoke to patient who is aware of appointments dates and times.

## 2020-05-29 ENCOUNTER — Other Ambulatory Visit: Payer: Self-pay | Admitting: Oncology

## 2020-05-29 NOTE — Progress Notes (Deleted)
[place]   [phone] [phone]   Clinic Follow up Note   Patient Care Team: Mayra Neer, MD as PCP - General (Family Medicine) Virgina Evener, Dawn, RN (Inactive) as Registered Nurse Ladell Pier, MD as Consulting Physician (Oncology) 05/29/2020  CHIEF COMPLAINT:   SUMMARY OF ONCOLOGIC HISTORY: Oncology History  Lung cancer ([place])  10/03/2018 Initial Diagnosis   Lung cancer ([place])   10/09/2018 - 08/20/2019 Chemotherapy   The patient had palonosetron (ALOXI) injection 0.25 mg, 0.25 mg, Intravenous,  Once, 4 of 4 cycles Administration: 0.25 mg (10/09/2018), 0.25 mg (11/20/2018), 0.25 mg (12/11/2018), 0.25 mg (10/30/2018) PEMEtrexed (ALIMTA) 1,000 mg in sodium chloride 0.9 % 100 mL chemo infusion, 520 mg/m2 = 950 mg, Intravenous,  Once, 15 of 17 cycles Administration: 1,000 mg (10/09/2018), 1,000 mg (11/20/2018), 1,000 mg (12/11/2018), 1,000 mg (01/01/2019), 1,000 mg (01/23/2019), 1,000 mg (10/30/2018), 1,000 mg (02/12/2019), 1,000 mg (03/05/2019), 1,000 mg (03/26/2019), 1,000 mg (04/16/2019), 1,000 mg (05/28/2019), 1,000 mg (06/18/2019), 1,000 mg (07/09/2019), 1,000 mg (07/31/2019), 1,000 mg (08/20/2019) CARBOplatin (PARAPLATIN) 460 mg in sodium chloride 0.9 % 250 mL chemo infusion, 460 mg (100 % of original dose 464.5 mg), Intravenous,  Once, 4 of 4 cycles Dose modification:   (original dose 464.5 mg, Cycle 1),   (original dose 464.5 mg, Cycle 3) Administration: 460 mg (10/09/2018), 460 mg (11/20/2018), 460 mg (12/11/2018), 460 mg (10/30/2018) pembrolizumab (KEYTRUDA) 200 mg in sodium chloride 0.9 % 50 mL chemo infusion, 200 mg, Intravenous, Once, 16 of 18 cycles Administration: 200 mg (10/09/2018), 200 mg (11/20/2018), 200 mg (12/11/2018), 200 mg (01/01/2019), 200 mg (01/23/2019), 200 mg (10/30/2018), 200 mg (02/12/2019), 200 mg (03/05/2019), 200 mg (03/26/2019), 200 mg (04/16/2019), 200 mg (05/07/2019), 200 mg (05/28/2019), 200 mg (06/18/2019), 200 mg (07/09/2019), 200 mg (07/31/2019), 200 mg  (08/20/2019) fosaprepitant (EMEND) 150 mg, dexamethasone (DECADRON) 12 mg in sodium chloride 0.9 % 145 mL IVPB, , Intravenous,  Once, 4 of 4 cycles Administration:  (10/09/2018),  (11/20/2018),  (12/11/2018),  (10/30/2018)  for chemotherapy treatment.    09/17/2019 -  Chemotherapy   The patient had palonosetron (ALOXI) injection 0.25 mg, 0.25 mg, Intravenous,  Once, 7 of 9 cycles Administration: 0.25 mg (12/24/2019), 0.25 mg (01/14/2020), 0.25 mg (02/04/2020), 0.25 mg (02/25/2020), 0.25 mg (03/17/2020), 0.25 mg (04/15/2020), 0.25 mg (05/09/2020) CARBOplatin (PARAPLATIN) 390 mg in sodium chloride 0.9 % 250 mL chemo infusion, 390 mg (100 % of original dose 392.4 mg), Intravenous,  Once, 7 of 9 cycles Dose modification: 392.4 mg (original dose 392.4 mg, Cycle 4) Administration: 390 mg (12/24/2019), 390 mg (01/14/2020), 390 mg (02/04/2020), 390 mg (02/25/2020), 390 mg (03/17/2020), 390 mg (04/15/2020), 390 mg (05/09/2020) gemcitabine (GEMZAR) 2,000 mg in sodium chloride 0.9 % 250 mL chemo infusion, 2,014 mg, Intravenous,  Once, 10 of 12 cycles Administration: 2,000 mg (09/17/2019), 2,000 mg (10/01/2019), 2,000 mg (10/15/2019), 2,000 mg (10/29/2019), 2,000 mg (11/12/2019), 2,000 mg (12/24/2019), 2,000 mg (01/14/2020), 2,000 mg (02/04/2020), 2,000 mg (02/25/2020), 2,000 mg (03/17/2020), 2,000 mg (04/15/2020), 2,014 mg (05/09/2020) fosaprepitant (EMEND) 150 mg in sodium chloride 0.9 % 145 mL IVPB, 150 mg, Intravenous,  Once, 7 of 9 cycles Administration: 150 mg (12/24/2019), 150 mg (01/14/2020), 150 mg (02/04/2020), 150 mg (02/25/2020), 150 mg (03/17/2020), 150 mg (04/15/2020), 150 mg (05/09/2020)  for chemotherapy treatment.      CURRENT THERAPY:   INTERVAL HISTORY:   REVIEW OF SYSTEMS:   Constitutional: Denies fevers, chills or abnormal weight loss Eyes: Denies blurriness of vision Ears, nose, mouth, throat, and face: Denies  mucositis or sore throat Respiratory: Denies cough, dyspnea or wheezes Cardiovascular: Denies palpitation,  chest discomfort or lower extremity swelling Gastrointestinal:  Denies nausea, heartburn or change in bowel habits Skin: Denies abnormal skin rashes Lymphatics: Denies new lymphadenopathy or easy bruising Neurological:Denies numbness, tingling or new weaknesses Behavioral/Psych: Mood is stable, no new changes  All other systems were reviewed with the patient and are negative.  MEDICAL HISTORY:  Past Medical History:  Diagnosis Date  . 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 COPD (chronic obstructive pulmonary disease) ([place])   . Dyspnea   . Hyperlipidemia   . Hypertension   . lung ca dx'd 08/2018    SURGICAL HISTORY: Past Surgical History:  Procedure Laterality Date  . BILIARY BRUSHING  09/09/2018   Procedure: BILIARY BRUSHING;  Surgeon: Ronnette Juniper, MD;  Location[facility] ENDOSCOPY;  Service: Gastroenterology;;  . BILIARY STENT PLACEMENT  09/09/2018   Procedure: [place];  Surgeon: Ronnette Juniper, MD;  Location: [facility];  Service: Gastroenterology;;  . BILIARY STENT PLACEMENT N/A 12/02/2019   Procedure: BILIARY STENT PLACEMENT;  Surgeon: Clarene Essex, MD;  Location: [facility] ENDOSCOPY;  Service: Endoscopy;  Laterality: N/A;  . ENDOSCOPIC RETROGRADE CHOLANGIOPANCREATOGRAPHY (ERCP) WITH PROPOFOL N/A 12/02/2019   Procedure: ENDOSCOPIC RETROGRADE CHOLANGIOPANCREATOGRAPHY (ERCP) WITH PROPOFOL;  Surgeon: Clarene Essex, MD;  Location: [facility] ENDOSCOPY;  Service: Endoscopy;  Laterality: N/A;  . ERCP N/A 09/09/2018   Procedure: ENDOSCOPIC RETROGRADE CHOLANGIOPANCREATOGRAPHY (ERCP);  Surgeon: Ronnette Juniper, MD;  Location: [place];  Service: Gastroenterology;  Laterality: N/A;  . FOOT SURGERY    . HERNIA REPAIR     WITH MESH  . IR IMAGING GUIDED PORT INSERTION  09/14/2019  . REMOVAL OF STONES  09/09/2018   Procedure: REMOVAL OF STONES;  Surgeon: Ronnette Juniper, MD;  Location: [facility] ENDOSCOPY;  Service: Gastroenterology;;  . REMOVAL OF STONES  12/02/2019   Procedure: REMOVAL OF STONES;  Surgeon:  Clarene Essex, MD;  Location: [facility] ENDOSCOPY;  Service: Endoscopy;;  . Joan Mayans  09/09/2018   Procedure: SPHINCTEROTOMY;  Surgeon: Ronnette Juniper, MD;  Location[facility] ENDOSCOPY;  Service: Gastroenterology;;    I have reviewed the social history and family history with the patient and they are unchanged from previous note.  ALLERGIES:  has No Known Allergies.  MEDICATIONS:  Current Outpatient Medications  Medication Sig Dispense Refill  . aspirin EC 81 MG tablet Take 81 mg by mouth daily.    . bisacodyl (DULCOLAX) 5 MG EC tablet Take 5 mg by mouth daily as needed for moderate constipation.    . furosemide (LASIX) 20 MG tablet Take 20 mg by mouth daily.    Marland Kitchen levothyroxine (SYNTHROID) 50 MCG tablet TAKE 1 TABLET BY MOUTH DAILY BEFORE BREAKFAST 90 tablet 1  . lisinopril (PRINIVIL,ZESTRIL) 40 MG tablet Take 40 mg by mouth daily.    . metoprolol tartrate (LOPRESSOR) 25 MG tablet Take 0.5 tablets (12.5 mg total) by mouth 2 (two) times daily. 30 tablet 0  . Multiple Vitamin (MULTIVITAMIN WITH MINERALS) TABS tablet Take 1 tablet by mouth daily.    . Multiple Vitamins-Minerals (PRESERVISION AREDS PO) Take 1 tablet by mouth 2 (two) times a day.    . Polyethylene Glycol 3350 (MIRALAX PO) Take 17 g by mouth daily as needed.    . prochlorperazine (COMPAZINE) 5 MG tablet Take 1 tablet (5 mg total) by mouth every 6 (six) hours as needed for nausea or vomiting. 30 tablet 3  . simvastatin (ZOCOR) 40 MG tablet Take 20 mg by mouth daily at 6  PM.  (Patient not taking: Reported on 04/07/2020)     No current facility-administered medications for this visit.   Facility-Administered Medications Ordered in Other Visits  Medication Dose Route Frequency Provider Last Rate Last Admin  . sodium chloride flush (NS) 0.9 % injection 10 mL  10 mL Intravenous PRN [name] B, MD      . sodium chloride flush (NS) 0.9 % injection 10 mL  10 mL Intravenous PRN Ladell Pier, MD        PHYSICAL EXAMINATION: ECOG  PERFORMANCE STATUS: {CHL ONC ECOG IR:[id number]}  There were no vitals filed for this visit. There were no vitals filed for this visit.  GENERAL:alert, no distress and comfortable SKIN: skin color, texture, turgor are normal, no rashes or significant lesions EYES: normal, Conjunctiva are pink and non-injected, sclera clear OROPHARYNX:no exudate, no erythema and lips, buccal mucosa, and tongue normal  NECK: supple, thyroid normal size, non-tender, without nodularity LYMPH:  no palpable lymphadenopathy in the cervical, axillary or inguinal LUNGS: clear to auscultation and percussion with normal breathing effort HEART: regular rate & rhythm and no murmurs and no lower extremity edema ABDOMEN:abdomen soft, non-tender and normal bowel sounds Musculoskeletal:no cyanosis of digits and no clubbing  NEURO: alert & oriented x 3 with fluent speech, no focal motor/sensory deficits  LABORATORY DATA:  I have reviewed the data as listed CBC Latest Ref Rng & Units 05/09/2020 04/07/2020 03/15/2020  WBC 4.0 - 10.5 K/uL 9.4 7.6 5.1  Hemoglobin 13.0 - 17.0 g/dL 14.1 13.8 14.4  Hematocrit 39 - 52 % 41.7 40.1 41.9  Platelets 150 - 400 K/uL 312 317 298     CMP Latest Ref Rng & Units 05/09/2020 04/11/2020 04/07/2020  Glucose 70 - 99 mg/dL 108(H) 93 94  BUN 8 - 23 mg/dL 13 11 12   Creatinine 0.61 - 1.24 mg/dL 0.72 0.65 0.67  Sodium 135 - 145 mmol/L 136 135 133(L)  Potassium 3.5 - 5.1 mmol/L 3.8 4.0 4.0  Chloride 98 - 111 mmol/L 96(L) 98 96(L)  CO2 22 - 32 mmol/L 32 33(H) 33(H)  Calcium 8.9 - 10.3 mg/dL 9.4 9.5 9.5  Total Protein 6.5 - 8.1 g/dL 7.4 7.2 7.0  Total Bilirubin 0.3 - 1.2 mg/dL 0.7 0.3 0.5  Alkaline Phos 38 - 126 U/L 200(H) 145(H) 150(H)  AST 15 - 41 U/L 58(H) 30 169(H)  ALT 0 - 44 U/L 92(H) 48(H) 131(H)      RADIOGRAPHIC STUDIES: I have personally reviewed the radiological images as listed and agreed with the findings in the report. No results found.   ASSESSMENT & PLAN:  No  problem-specific Assessment & Plan notes found for this encounter.   No orders of the defined types were placed in this encounter.  All questions were answered. The patient knows to call the clinic with any problems, questions or concerns. No barriers to learning was detected. I spent {CHL ONC TIME VISIT - PZWCH:[id number]} counseling the patient face to face. The total time spent in the appointment was {CHL ONC TIME VISIT - NTIRW:4315400867} and more than 50% was on counseling and review of test results     Alla Feeling, NP 05/29/20

## 2020-05-30 ENCOUNTER — Ambulatory Visit: Payer: Medicare Other | Admitting: Nurse Practitioner

## 2020-05-30 ENCOUNTER — Other Ambulatory Visit: Payer: Medicare Other

## 2020-05-30 ENCOUNTER — Ambulatory Visit: Payer: Medicare Other

## 2020-05-31 ENCOUNTER — Inpatient Hospital Stay: Payer: Medicare Other

## 2020-05-31 ENCOUNTER — Encounter: Payer: Self-pay | Admitting: Nurse Practitioner

## 2020-05-31 ENCOUNTER — Other Ambulatory Visit: Payer: Self-pay

## 2020-05-31 ENCOUNTER — Inpatient Hospital Stay: Payer: Medicare Other | Attending: Oncology

## 2020-05-31 ENCOUNTER — Inpatient Hospital Stay (HOSPITAL_BASED_OUTPATIENT_CLINIC_OR_DEPARTMENT_OTHER): Payer: Medicare Other | Admitting: Nurse Practitioner

## 2020-05-31 VITALS — BP 137/63 | HR 81 | Temp 98.3°F | Resp 18 | Ht 71.0 in | Wt 177.1 lb

## 2020-05-31 DIAGNOSIS — C3431 Malignant neoplasm of lower lobe, right bronchus or lung: Secondary | ICD-10-CM | POA: Insufficient documentation

## 2020-05-31 DIAGNOSIS — J449 Chronic obstructive pulmonary disease, unspecified: Secondary | ICD-10-CM | POA: Diagnosis not present

## 2020-05-31 DIAGNOSIS — Z79899 Other long term (current) drug therapy: Secondary | ICD-10-CM | POA: Insufficient documentation

## 2020-05-31 DIAGNOSIS — I10 Essential (primary) hypertension: Secondary | ICD-10-CM | POA: Diagnosis not present

## 2020-05-31 DIAGNOSIS — M25511 Pain in right shoulder: Secondary | ICD-10-CM | POA: Diagnosis not present

## 2020-05-31 DIAGNOSIS — C3490 Malignant neoplasm of unspecified part of unspecified bronchus or lung: Secondary | ICD-10-CM

## 2020-05-31 DIAGNOSIS — H3530 Unspecified macular degeneration: Secondary | ICD-10-CM | POA: Insufficient documentation

## 2020-05-31 DIAGNOSIS — C787 Secondary malignant neoplasm of liver and intrahepatic bile duct: Secondary | ICD-10-CM | POA: Insufficient documentation

## 2020-05-31 DIAGNOSIS — Z5111 Encounter for antineoplastic chemotherapy: Secondary | ICD-10-CM | POA: Insufficient documentation

## 2020-05-31 DIAGNOSIS — M25572 Pain in left ankle and joints of left foot: Secondary | ICD-10-CM | POA: Diagnosis not present

## 2020-05-31 DIAGNOSIS — Z95828 Presence of other vascular implants and grafts: Secondary | ICD-10-CM

## 2020-05-31 LAB — CMP (CANCER CENTER ONLY)
ALT: 45 U/L — ABNORMAL HIGH (ref 0–44)
AST: 38 U/L (ref 15–41)
Albumin: 3.4 g/dL — ABNORMAL LOW (ref 3.5–5.0)
Alkaline Phosphatase: 170 U/L — ABNORMAL HIGH (ref 38–126)
Anion gap: 9 (ref 5–15)
BUN: 12 mg/dL (ref 8–23)
CO2: 30 mmol/L (ref 22–32)
Calcium: 9.4 mg/dL (ref 8.9–10.3)
Chloride: 97 mmol/L — ABNORMAL LOW (ref 98–111)
Creatinine: 0.72 mg/dL (ref 0.61–1.24)
GFR, Estimated: >60 mL/min (ref >60)
Glucose, Bld: 110 mg/dL — ABNORMAL HIGH (ref 70–99)
Potassium: 3.6 mmol/L (ref 3.5–5.1)
Sodium: 136 mmol/L (ref 135–145)
Total Bilirubin: 0.5 mg/dL (ref 0.3–1.2)
Total Protein: 7.4 g/dL (ref 6.5–8.1)

## 2020-05-31 LAB — CBC WITH DIFFERENTIAL (CANCER CENTER ONLY)
Abs Immature Granulocytes: 0.06 10*3/uL (ref 0.00–0.07)
Basophils Absolute: 0.1 10*3/uL (ref 0.0–0.1)
Basophils Relative: 1 %
Eosinophils Absolute: 0.1 10*3/uL (ref 0.0–0.5)
Eosinophils Relative: 2 %
HCT: 41.1 % (ref 39.0–52.0)
Hemoglobin: 14 g/dL (ref 13.0–17.0)
Immature Granulocytes: 1 %
Lymphocytes Relative: 15 %
Lymphs Abs: 1 10*3/uL (ref 0.7–4.0)
MCH: 32.6 pg (ref 26.0–34.0)
MCHC: 34.1 g/dL (ref 30.0–36.0)
MCV: 95.6 fL (ref 80.0–100.0)
Monocytes Absolute: 1.1 10*3/uL — ABNORMAL HIGH (ref 0.1–1.0)
Monocytes Relative: 17 %
Neutro Abs: 4.3 10*3/uL (ref 1.7–7.7)
Neutrophils Relative %: 64 %
Platelet Count: 383 10*3/uL (ref 150–400)
RBC: 4.3 MIL/uL (ref 4.22–5.81)
RDW: 15.9 % — ABNORMAL HIGH (ref 11.5–15.5)
WBC Count: 6.7 10*3/uL (ref 4.0–10.5)
nRBC: 0 % (ref 0.0–0.2)

## 2020-05-31 LAB — CEA (IN HOUSE-CHCC): CEA (CHCC-In House): 6525.66 ng/mL — ABNORMAL HIGH (ref 0.00–5.00)

## 2020-05-31 MED ORDER — SODIUM CHLORIDE 0.9 % IV SOLN
Freq: Once | INTRAVENOUS | Status: DC
  Filled 2020-05-31: qty 250

## 2020-05-31 MED ORDER — DIPHENHYDRAMINE HCL 50 MG/ML IJ SOLN
INTRAMUSCULAR | Status: AC
  Filled 2020-05-31: qty 1

## 2020-05-31 MED ORDER — HEPARIN SOD (PORK) LOCK FLUSH 100 UNIT/ML IV SOLN
500.0000 [IU] | Freq: Once | INTRAVENOUS | Status: AC | PRN
  Administered 2020-05-31: 500 [IU]
  Filled 2020-05-31: qty 5

## 2020-05-31 MED ORDER — SODIUM CHLORIDE 0.9 % IV SOLN
150.0000 mg | Freq: Once | INTRAVENOUS | Status: AC
  Administered 2020-05-31: 150 mg via INTRAVENOUS
  Filled 2020-05-31: qty 150

## 2020-05-31 MED ORDER — SODIUM CHLORIDE 0.9 % IV SOLN
Freq: Once | INTRAVENOUS | Status: AC
  Filled 2020-05-31: qty 250

## 2020-05-31 MED ORDER — DIPHENHYDRAMINE HCL 50 MG/ML IJ SOLN
25.0000 mg | Freq: Once | INTRAMUSCULAR | Status: AC
  Administered 2020-05-31: 25 mg via INTRAVENOUS

## 2020-05-31 MED ORDER — FAMOTIDINE IN NACL 20-0.9 MG/50ML-% IV SOLN
20.0000 mg | Freq: Once | INTRAVENOUS | Status: AC
  Administered 2020-05-31: 20 mg via INTRAVENOUS

## 2020-05-31 MED ORDER — PALONOSETRON HCL INJECTION 0.25 MG/5ML
INTRAVENOUS | Status: AC
  Filled 2020-05-31: qty 5

## 2020-05-31 MED ORDER — SODIUM CHLORIDE 0.9 % IV SOLN
1000.0000 mg/m2 | Freq: Once | INTRAVENOUS | Status: AC
  Administered 2020-05-31: 2014 mg via INTRAVENOUS
  Filled 2020-05-31: qty 52.97

## 2020-05-31 MED ORDER — PALONOSETRON HCL INJECTION 0.25 MG/5ML
0.2500 mg | Freq: Once | INTRAVENOUS | Status: AC
  Administered 2020-05-31: 0.25 mg via INTRAVENOUS

## 2020-05-31 MED ORDER — SODIUM CHLORIDE 0.9% FLUSH
10.0000 mL | INTRAVENOUS | Status: DC | PRN
  Administered 2020-05-31: 10 mL
  Filled 2020-05-31: qty 10

## 2020-05-31 MED ORDER — SODIUM CHLORIDE 0.9 % IV SOLN
10.0000 mg | Freq: Once | INTRAVENOUS | Status: AC
  Administered 2020-05-31: 10 mg via INTRAVENOUS
  Filled 2020-05-31: qty 10

## 2020-05-31 MED ORDER — FAMOTIDINE IN NACL 20-0.9 MG/50ML-% IV SOLN
INTRAVENOUS | Status: AC
  Filled 2020-05-31: qty 50

## 2020-05-31 MED ORDER — SODIUM CHLORIDE 0.9 % IV SOLN
392.4000 mg | Freq: Once | INTRAVENOUS | Status: AC
  Administered 2020-05-31: 390 mg via INTRAVENOUS
  Filled 2020-05-31: qty 39

## 2020-05-31 NOTE — Patient Instructions (Signed)
[facility] Discharge Instructions for Patients Receiving Chemotherapy  Today you received the following chemotherapy agents: Carboplatin and Gemcitabine (Gemzar)  To help prevent nausea and vomiting after your treatment, we encourage you to take your nausea medication as directed by your MD.   If you develop nausea and vomiting that is not controlled by your nausea medication, call the clinic.   BELOW ARE SYMPTOMS THAT SHOULD BE REPORTED IMMEDIATELY:  *FEVER GREATER THAN 100.5 F  *CHILLS WITH OR WITHOUT FEVER  NAUSEA AND VOMITING THAT IS NOT CONTROLLED WITH YOUR NAUSEA MEDICATION  *UNUSUAL SHORTNESS OF BREATH  *UNUSUAL BRUISING OR BLEEDING  TENDERNESS IN MOUTH AND THROAT WITH OR WITHOUT PRESENCE OF ULCERS  *URINARY PROBLEMS  *BOWEL PROBLEMS  UNUSUAL RASH Items with * indicate a potential emergency and should be followed up as soon as possible.  Feel free to call the clinic should you have any questions or concerns. The clinic phone number is [phone].  Please show the [facility] at check-in to the Emergency Department and triage nurse.

## 2020-05-31 NOTE — Progress Notes (Signed)
[facility] OFFICE PROGRESS NOTE   Diagnosis: Non-small cell lung cancer  INTERVAL HISTORY:   Ricky Conway returns as scheduled.  He completed cycle 7 gemcitabine/carboplatin 04/08/2020.  He denies nausea/vomiting.  No mouth sores.  Periodic mild constipation relieved with laxatives.  No diarrhea.  Stable dyspnea on exertion.  Skin rash is better.  He has a good appetite.  Objective:  Vital signs in last 24 hours:  Blood pressure 137/63, pulse 81, temperature 98.3 F (36.8 C), temperature source Tympanic, resp. rate 18, height '5\' 11"'  (1.803 m), weight 177 lb 1.6 oz (80.3 kg), SpO2 92 %.    HEENT: No thrush or ulcers. Resp: Distant breath sounds.  No respiratory distress. Cardio: Regular rate and rhythm. GI: Fullness right upper abdomen, mild associated tenderness. Vascular: No leg edema.  Skin: No rash. Port-A-Cath without erythema.   Lab Results:  Lab Results  Component Value Date   WBC 6.7 05/31/2020   HGB 14.0 05/31/2020   HCT 41.1 05/31/2020   MCV 95.6 05/31/2020   PLT 383 05/31/2020   NEUTROABS 4.3 05/31/2020    Imaging:  No results found.  Medications: I have reviewed the patient's current medications.  Assessment/Plan: 1. Pancreas mass, liver lesions  Abdominal ultrasound 09/03/2018-3 liver lesions. Intra-and extrahepatic biliary ductal dilatation.  Presentation to the emergency department 09/08/2018 with painless jaundice. Initial bilirubin returned at 18.3.   MRI abdomen 09/08/2018-1.9 x 2.6 cm hypoenhancing lesion arising exophytically along the posterior aspect of the pancreatic head suspicious for pancreatic adenocarcinoma. There was suspected involvement of the distal common duct with secondary intrahepatic/extrahepatic ductal dilatation. Lesion noted to abut the IVC. Multiple liver metastases noted. Small upper abdominal/retroperitoneal lymph nodes noted.   ERCP 09/09/2018-high-grade stricture and obstruction of the distal common  bile duct. A metallic biliary stent was placed. Brushings in the mid and lower third of the main bile duct were obtained with no malignant cells identified.   Bilirubin improved at 4.7 on 09/10/2018.   Biopsy of a liver lesion on 09/11/2018-metastatic carcinoma, cytokeratin 7 and TTF-1 positive, negative for cytokeratin 20, CDX 2, and PSA. Foundation 1-microsatellite stable, tumor mutation burden 16, ERBB2 amplification  CT chest 09/22/2018-right hilar/mediastinal/retrocrural lymphadenopathy, ill-defined right lower lobe nodularity, emphysema  Cycle 1 carboplatin/Alimta/pembrolizumab 10/09/2018  Cycle 2 carboplatin/Alimta/pembrolizumab 10/30/2018  Cycle 3 carboplatin/Alimta/pembrolizumab 11/20/2018  Cycle 4 carboplatin/Alimta/pembrolizumab 12/11/2018  CT 12/26/2018-marked response to therapy of the radical abdominal adenopathy, decreased size of hepatic metastases, no new or progressive disease  Cycle 5 Alimta/pembrolizumab 01/01/2019  Cycle 6 Alimta/pembrolizumab 01/23/2019  Cycle 7 Alimta/pembrolizumab 02/12/2019  Cycle 8 Alimta/pembrolizumab 03/05/2019  Cycle 9 Alimta/pembrolizumab 03/26/2019  CTs 04/14/2019-slight interval decrease in size and conspicuity of multiple, irregular low-attenuation liver lesions. No new liver lesions. No recurrent mediastinal or hilar lymphadenopathy. No change in mildly prominent subcentimeter periaortic and peripheral lymph nodes without evidence of recurrent abdominal or pelvic lymphadenopathy.  Cycle 10 Alimta/pembrolizumab 04/16/2019  Cycle 11 pembrolizumab 05/07/2019 (Alimta held secondary to increased tearing and leg edema)  Cycle 12 Alimta/pembrolizumab 05/28/2019  Cycle 13 Alimta/pembrolizumab 06/18/2019  Restaging CTs 07/08/2019-worsening of low-density hepatic lesions. Scattered small lymph nodes with increasing conspicuity, largest along the left periaortic chain approximately 9 mm previously approximately 5 mm.  Cycle 14  Alimta/pembrolizumab 07/09/2019  Cycle 15 Alimta/pembrolizumab 07/31/2019  Cycle 16 Alimta/pembrolizumab 08/20/2019  Rising CEA beginning December 2020  CTs 09/08/2019-enlargement of liver metastases, mild enlargement of abdominal/retroperitoneal and lower thoracic nodes, nonspecific pulmonary nodules-stable  Cycle 1 gemcitabine 09/17/2019  Cycle 2 gemcitabine 10/01/2019  Cycle  3 gemcitabine 10/15/2019  Cycle 4 gemcitabine 10/29/2019  Cycle 5 gemcitabine 11/12/2019  Rising CEA 10/2019  CTs 11/19/19 mixed response in liver, stable pulmonary nodules, thoracic and RP nodes, overall stable  Plan to intensify chemo carboplatin AUC 4 plus gemcitabine given on day 1 q21 days starting ~12/03/19  Admission 11/30/2019 with recurrent biliary obstruction-ERCP 12/02/2019-obstructed biliary stent, new stent placed  Cycle 1 gemcitabine/carboplatin 12/24/2019  Cycle 2 gemcitabine/carboplatin 01/14/2020  Cycle 3 gemcitabine/carboplatin 02/04/2020  Cycle 4 gemcitabine/carboplatin 02/25/2020  CTs 03/15/2020-decrease in size of hepatic metastatic lesions. No new liver lesions. Newsclerotic lesions in the thoracic and lumbar spine. Stable small pulmonary nodules. Asymmetric wall thickening of the medial wall of the gallbladder new since previous exam without signs of inflammation.  Cycle5gemcitabine/carboplatin 03/17/2020  Cycle 6 gemcitabine/carboplatin 04/15/2020  Cycle 7 gemcitabine/carboplatin 05/09/2020  Cycle 8 gemcitabine/carboplatin 05/31/2020 2. COPD 3. Hypertension 4. Trace edema right leg 12/11/2018. Doppler negative for DVT 12/12/2018. 5. Watery eyes, runny nose reported 12/11/2018. Question related to Alimta. Persistent watery eyes 04/16/2019. Ophthalmology evaluation 05/11/2019-blepharitis,ectropion left lower lid, macular degeneration 6. Admission 12/24/2018 with a fever-no source for infection identified, completed outpatient course of ciprofloxacin 7. Port-A-Cath placement 09/14/2019,  interventional radiology 8. Colonoscopy 06/16/2015-diverticulosis in the sigmoid colon, examination otherwise normal   Disposition: Ricky Conway appears stable.  He has completed 7 cycles of gemcitabine/carboplatin.  He continues to tolerate treatment well.  There is no clinical evidence of disease progression.  Plan to proceed with cycle 8 today as scheduled.  Restaging CTs prior to his next visit.  We reviewed the CBC and chemistry panel from today.  Labs adequate to proceed with treatment.  He will return for follow-up in 3 weeks, restaging CTs a few days prior.  He will contact the office in the interim with any problems.    Ned Card ANP/GNP-BC   05/31/2020  9:15 AM

## 2020-06-01 ENCOUNTER — Telehealth: Payer: Self-pay | Admitting: Nurse Practitioner

## 2020-06-01 NOTE — Telephone Encounter (Signed)
Scheduled appointments per 12/7 los. Spoke to patient's wife who requested an updated calendar to be printed at next appointment. I put in notes to have calendar printed next time patient comes in.

## 2020-06-16 ENCOUNTER — Encounter (HOSPITAL_COMMUNITY): Payer: Self-pay

## 2020-06-16 ENCOUNTER — Ambulatory Visit (HOSPITAL_COMMUNITY)
Admission: RE | Admit: 2020-06-16 | Discharge: 2020-06-16 | Disposition: A | Discharge status: Home / Self Care | Payer: Medicare Other | Source: Ambulatory Visit | Attending: Nurse Practitioner | Admitting: Nurse Practitioner

## 2020-06-16 ENCOUNTER — Other Ambulatory Visit: Payer: Self-pay

## 2020-06-16 DIAGNOSIS — C3490 Malignant neoplasm of unspecified part of unspecified bronchus or lung: Secondary | ICD-10-CM | POA: Diagnosis present

## 2020-06-16 MED ORDER — IOHEXOL 300 MG/ML  SOLN
100.0000 mL | Freq: Once | INTRAMUSCULAR | Status: AC | PRN
  Administered 2020-06-16: 100 mL via INTRAVENOUS

## 2020-06-19 ENCOUNTER — Other Ambulatory Visit: Payer: Self-pay | Admitting: Oncology

## 2020-06-20 ENCOUNTER — Inpatient Hospital Stay: Payer: Medicare Other

## 2020-06-20 ENCOUNTER — Inpatient Hospital Stay (HOSPITAL_BASED_OUTPATIENT_CLINIC_OR_DEPARTMENT_OTHER): Payer: Medicare Other | Admitting: Oncology

## 2020-06-20 ENCOUNTER — Ambulatory Visit: Payer: Medicare Other

## 2020-06-20 ENCOUNTER — Ambulatory Visit: Payer: Medicare Other | Admitting: Oncology

## 2020-06-20 ENCOUNTER — Other Ambulatory Visit: Payer: Self-pay

## 2020-06-20 ENCOUNTER — Other Ambulatory Visit: Payer: Medicare Other

## 2020-06-20 VITALS — BP 151/55 | HR 90 | Temp 98.2°F | Resp 20 | Ht 71.0 in | Wt 182.3 lb

## 2020-06-20 DIAGNOSIS — C3490 Malignant neoplasm of unspecified part of unspecified bronchus or lung: Secondary | ICD-10-CM

## 2020-06-20 DIAGNOSIS — Z5111 Encounter for antineoplastic chemotherapy: Secondary | ICD-10-CM | POA: Diagnosis not present

## 2020-06-20 DIAGNOSIS — Z95828 Presence of other vascular implants and grafts: Secondary | ICD-10-CM

## 2020-06-20 LAB — CMP (CANCER CENTER ONLY)
ALT: 33 U/L (ref 0–44)
AST: 33 U/L (ref 15–41)
Albumin: 3.9 g/dL (ref 3.5–5.0)
Alkaline Phosphatase: 110 U/L (ref 38–126)
Anion gap: 10 (ref 5–15)
BUN: 9 mg/dL (ref 8–23)
CO2: 29 mmol/L (ref 22–32)
Calcium: 9.2 mg/dL (ref 8.9–10.3)
Chloride: 94 mmol/L — ABNORMAL LOW (ref 98–111)
Creatinine: 0.69 mg/dL (ref 0.61–1.24)
GFR, Estimated: >60 mL/min (ref >60)
Glucose, Bld: 148 mg/dL — ABNORMAL HIGH (ref 70–99)
Potassium: 3.7 mmol/L (ref 3.5–5.1)
Sodium: 133 mmol/L — ABNORMAL LOW (ref 135–145)
Total Bilirubin: 0.5 mg/dL (ref 0.3–1.2)
Total Protein: 7.3 g/dL (ref 6.5–8.1)

## 2020-06-20 LAB — CBC WITH DIFFERENTIAL (CANCER CENTER ONLY)
Abs Immature Granulocytes: 0.03 10*3/uL (ref 0.00–0.07)
Basophils Absolute: 0 10*3/uL (ref 0.0–0.1)
Basophils Relative: 1 %
Eosinophils Absolute: 0.1 10*3/uL (ref 0.0–0.5)
Eosinophils Relative: 2 %
HCT: 41.9 % (ref 39.0–52.0)
Hemoglobin: 14.1 g/dL (ref 13.0–17.0)
Immature Granulocytes: 1 %
Lymphocytes Relative: 16 %
Lymphs Abs: 0.8 10*3/uL (ref 0.7–4.0)
MCH: 32.6 pg (ref 26.0–34.0)
MCHC: 33.7 g/dL (ref 30.0–36.0)
MCV: 96.8 fL (ref 80.0–100.0)
Monocytes Absolute: 0.9 10*3/uL (ref 0.1–1.0)
Monocytes Relative: 20 %
Neutro Abs: 3 10*3/uL (ref 1.7–7.7)
Neutrophils Relative %: 60 %
Platelet Count: 281 10*3/uL (ref 150–400)
RBC: 4.33 MIL/uL (ref 4.22–5.81)
RDW: 17.2 % — ABNORMAL HIGH (ref 11.5–15.5)
WBC Count: 4.8 10*3/uL (ref 4.0–10.5)
nRBC: 0 % (ref 0.0–0.2)

## 2020-06-20 LAB — CEA (IN HOUSE-CHCC): CEA (CHCC-In House): 8504.61 ng/mL — ABNORMAL HIGH (ref 0.00–5.00)

## 2020-06-20 MED ORDER — SODIUM CHLORIDE 0.9% FLUSH
10.0000 mL | INTRAVENOUS | Status: DC | PRN
  Administered 2020-06-20: 10 mL via INTRAVENOUS
  Filled 2020-06-20: qty 10

## 2020-06-20 MED ORDER — HEPARIN SOD (PORK) LOCK FLUSH 100 UNIT/ML IV SOLN
500.0000 [IU] | Freq: Once | INTRAVENOUS | Status: AC
  Administered 2020-06-20: 500 [IU] via INTRAVENOUS
  Filled 2020-06-20: qty 5

## 2020-06-20 MED ORDER — SODIUM CHLORIDE 0.9% FLUSH
10.0000 mL | INTRAVENOUS | Status: DC | PRN
  Administered 2020-06-20: 10 mL
  Filled 2020-06-20: qty 10

## 2020-06-20 NOTE — Progress Notes (Signed)
[facility] OFFICE PROGRESS NOTE   Diagnosis: Lung cancer  INTERVAL HISTORY:   Ricky Conway returns for a scheduled visit.  He completed another cycle of gemcitabine/carboplatin on 05/31/2020.  Good appetite.  He reports discomfort at the right shoulder and left ankle, relieved with Tylenol.  No other complaint.  Objective:  Vital signs in last 24 hours:  Blood pressure (!) 151/55, pulse 90, temperature 98.2 F (36.8 C), temperature source Tympanic, resp. rate 20, height 5' 11" (1.803 m), weight 182 lb 4.8 oz (82.7 kg), SpO2 93 %.    HEENT: No thrush or ulcers Resp: Distant breath sounds, no respiratory distress Cardio: Regular rate and rhythm GI: No hepatomegaly, nontender Vascular: No leg edema Musculoskeletal: No pain with motion of the right shoulder.  No tenderness or pain with motion of the left ankle.  Portacath/PICC-without erythema  Lab Results:  Lab Results  Component Value Date   WBC 4.8 06/20/2020   HGB 14.1 06/20/2020   HCT 41.9 06/20/2020   MCV 96.8 06/20/2020   PLT 281 06/20/2020   NEUTROABS 3.0 06/20/2020    CMP  Lab Results  Component Value Date   NA 136 05/31/2020   K 3.6 05/31/2020   CL 97 (L) 05/31/2020   CO2 30 05/31/2020   GLUCOSE 110 (H) 05/31/2020   BUN 12 05/31/2020   CREATININE 0.72 05/31/2020   CALCIUM 9.4 05/31/2020   PROT 7.4 05/31/2020   ALBUMIN 3.4 (L) 05/31/2020   AST 38 05/31/2020   ALT 45 (H) 05/31/2020   ALKPHOS 170 (H) 05/31/2020   BILITOT 0.5 05/31/2020   GFRNONAA >60 05/31/2020   GFRAA >60 03/15/2020    Lab Results  Component Value Date   CEA1 6,525.66 (H) 05/31/2020    Lab Results  Component Value Date   INR 1.0 09/14/2019    Imaging:  CT Chest W Contrast  Result Date: 06/16/2020 CLINICAL DATA:  Restaging metastatic non-small cell lung cancer, ongoing chemotherapy and immunotherapy EXAM: CT CHEST, ABDOMEN, AND PELVIS WITH CONTRAST TECHNIQUE: Multidetector CT imaging of the chest, abdomen and  pelvis was performed following the standard protocol during bolus administration of intravenous contrast. CONTRAST:  166m OMNIPAQUE IOHEXOL 300 MG/ML SOLN, additional oral enteric contrast COMPARISON:  03/15/2020, 11/19/2019 FINDINGS: CT CHEST FINDINGS Cardiovascular: Right chest port catheter. Aortic atherosclerosis. Normal heart size. Three-vessel coronary artery calcifications. No pericardial effusion. Mediastinum/Nodes: No enlarged mediastinal, hilar, or axillary lymph nodes. Thyroid gland, trachea, and esophagus demonstrate no significant findings. Lungs/Pleura: Severe centrilobular emphysema. Stable fissural nodule of the right middle lobe measuring 6 mm (series 6, image 108). No pleural effusion or pneumothorax. Musculoskeletal: No chest wall mass. Unchanged appearance of multiple sclerotic osseous lesions, for example in the T11 vertebral body (series 5, image 82). CT ABDOMEN PELVIS FINDINGS Hepatobiliary: Interval increase in size of numerous hypodense liver lesions, an index lesion in the central right lobe, hepatic segment VIII, measuring 4.4 x 3.7 cm, previously 3.1 x 2.8 cm (series 2, image 55), an additional index lesion in the inferior right lobe of the liver, hepatic segment VI measuring 2.8 x 2.3 cm, previously 1.8 x 1.5 cm (series 2, image 74). Unchanged, somewhat eccentric thickening of the medial gallbladder wall (series 2, image 71). Common bile duct stent remains in position. No biliary ductal dilatation. Post stenting pneumobilia. Pancreas: Unremarkable. No pancreatic ductal dilatation or surrounding inflammatory changes. Spleen: Normal in size without significant abnormality. Adrenals/Urinary Tract: Adrenal glands are unremarkable. Kidneys are normal, without renal calculi, solid lesion, or hydronephrosis. Bladder  is unremarkable. Stomach/Bowel: Stomach is within normal limits. Appendix appears normal. No evidence of bowel wall thickening, distention, or inflammatory changes. Sigmoid  diverticulosis. Vascular/Lymphatic: Aortic atherosclerosis. Redemonstrated ectasias of the infrarenal abdominal aorta, measuring up to 2.9 x 2.9 cm. Unchanged prominent subcentimeter celiac axis and crural lymph nodes (series 2, image 64). Reproductive: Prostatomegaly. Other: No abdominal wall hernia or abnormality. No abdominopelvic ascites. Musculoskeletal: No acute osseous findings. IMPRESSION: 1. Interval increase in size of numerous hypodense liver lesions, findings consistent with worsened hepatic metastatic disease. 2. Common bile duct stent remains in position. No biliary ductal dilatation. 3. Unchanged prominent subcentimeter celiac axis and crural lymph nodes. 4. Unchanged appearance of multiple sclerotic osseous lesions. No new osseous metastatic lesions identified. 5. No evidence of recurrent or metastatic disease in the chest. 6. Severe emphysema. 7. Coronary artery disease. 8. Redemonstrated ectasia of the infrarenal abdominal aorta measuring up to 2.9 cm. Recommend follow-up ultrasound every 5 years if not otherwise followed. This recommendation follows ACR consensus guidelines: White Paper of the ACR Incidental Findings Committee II on Vascular Findings. J Am Coll Radiol 2013; 10:789-794. Aortic Atherosclerosis (ICD10-I70.0) and Emphysema (ICD10-J43.9). Electronically Signed   By: Eddie Candle M.D.   On: 06/16/2020 15:59   CT Abdomen Pelvis W Contrast  Result Date: 06/16/2020 CLINICAL DATA:  Restaging metastatic non-small cell lung cancer, ongoing chemotherapy and immunotherapy EXAM: CT CHEST, ABDOMEN, AND PELVIS WITH CONTRAST TECHNIQUE: Multidetector CT imaging of the chest, abdomen and pelvis was performed following the standard protocol during bolus administration of intravenous contrast. CONTRAST:  169m OMNIPAQUE IOHEXOL 300 MG/ML SOLN, additional oral enteric contrast COMPARISON:  03/15/2020, 11/19/2019 FINDINGS: CT CHEST FINDINGS Cardiovascular: Right chest port catheter. Aortic  atherosclerosis. Normal heart size. Three-vessel coronary artery calcifications. No pericardial effusion. Mediastinum/Nodes: No enlarged mediastinal, hilar, or axillary lymph nodes. Thyroid gland, trachea, and esophagus demonstrate no significant findings. Lungs/Pleura: Severe centrilobular emphysema. Stable fissural nodule of the right middle lobe measuring 6 mm (series 6, image 108). No pleural effusion or pneumothorax. Musculoskeletal: No chest wall mass. Unchanged appearance of multiple sclerotic osseous lesions, for example in the T11 vertebral body (series 5, image 82). CT ABDOMEN PELVIS FINDINGS Hepatobiliary: Interval increase in size of numerous hypodense liver lesions, an index lesion in the central right lobe, hepatic segment VIII, measuring 4.4 x 3.7 cm, previously 3.1 x 2.8 cm (series 2, image 55), an additional index lesion in the inferior right lobe of the liver, hepatic segment VI measuring 2.8 x 2.3 cm, previously 1.8 x 1.5 cm (series 2, image 74). Unchanged, somewhat eccentric thickening of the medial gallbladder wall (series 2, image 71). Common bile duct stent remains in position. No biliary ductal dilatation. Post stenting pneumobilia. Pancreas: Unremarkable. No pancreatic ductal dilatation or surrounding inflammatory changes. Spleen: Normal in size without significant abnormality. Adrenals/Urinary Tract: Adrenal glands are unremarkable. Kidneys are normal, without renal calculi, solid lesion, or hydronephrosis. Bladder is unremarkable. Stomach/Bowel: Stomach is within normal limits. Appendix appears normal. No evidence of bowel wall thickening, distention, or inflammatory changes. Sigmoid diverticulosis. Vascular/Lymphatic: Aortic atherosclerosis. Redemonstrated ectasias of the infrarenal abdominal aorta, measuring up to 2.9 x 2.9 cm. Unchanged prominent subcentimeter celiac axis and crural lymph nodes (series 2, image 64). Reproductive: Prostatomegaly. Other: No abdominal wall hernia or  abnormality. No abdominopelvic ascites. Musculoskeletal: No acute osseous findings. IMPRESSION: 1. Interval increase in size of numerous hypodense liver lesions, findings consistent with worsened hepatic metastatic disease. 2. Common bile duct stent remains in position. No biliary ductal dilatation. 3. Unchanged prominent  subcentimeter celiac axis and crural lymph nodes. 4. Unchanged appearance of multiple sclerotic osseous lesions. No new osseous metastatic lesions identified. 5. No evidence of recurrent or metastatic disease in the chest. 6. Severe emphysema. 7. Coronary artery disease. 8. Redemonstrated ectasia of the infrarenal abdominal aorta measuring up to 2.9 cm. Recommend follow-up ultrasound every 5 years if not otherwise followed. This recommendation follows ACR consensus guidelines: White Paper of the ACR Incidental Findings Committee II on Vascular Findings. J Am Coll Radiol 2013; 10:789-794. Aortic Atherosclerosis (ICD10-I70.0) and Emphysema (ICD10-J43.9). Electronically Signed   By: Eddie Candle M.D.   On: 06/16/2020 15:59    Medications: I have reviewed the patient's current medications.   Assessment/Plan: 1. Pancreas mass, liver lesions  Abdominal ultrasound 09/03/2018-3 liver lesions. Intra-and extrahepatic biliary ductal dilatation.  Presentation to the emergency department 09/08/2018 with painless jaundice. Initial bilirubin returned at 18.3.   MRI abdomen 09/08/2018-1.9 x 2.6 cm hypoenhancing lesion arising exophytically along the posterior aspect of the pancreatic head suspicious for pancreatic adenocarcinoma. There was suspected involvement of the distal common duct with secondary intrahepatic/extrahepatic ductal dilatation. Lesion noted to abut the IVC. Multiple liver metastases noted. Small upper abdominal/retroperitoneal lymph nodes noted.   ERCP 09/09/2018-high-grade stricture and obstruction of the distal common bile duct. A metallic biliary stent was placed.  Brushings in the mid and lower third of the main bile duct were obtained with no malignant cells identified.   Bilirubin improved at 4.7 on 09/10/2018.   Biopsy of a liver lesion on 09/11/2018-metastatic carcinoma, cytokeratin 7 and TTF-1 positive, negative for cytokeratin 20, CDX 2, and PSA. Foundation 1-microsatellite stable, tumor mutation burden 16, ERBB2 amplification  CT chest 09/22/2018-right hilar/mediastinal/retrocrural lymphadenopathy, ill-defined right lower lobe nodularity, emphysema  Cycle 1 carboplatin/Alimta/pembrolizumab 10/09/2018  Cycle 2 carboplatin/Alimta/pembrolizumab 10/30/2018  Cycle 3 carboplatin/Alimta/pembrolizumab 11/20/2018  Cycle 4 carboplatin/Alimta/pembrolizumab 12/11/2018  CT 12/26/2018-marked response to therapy of the radical abdominal adenopathy, decreased size of hepatic metastases, no new or progressive disease  Cycle 5 Alimta/pembrolizumab 01/01/2019  Cycle 6 Alimta/pembrolizumab 01/23/2019  Cycle 7 Alimta/pembrolizumab 02/12/2019  Cycle 8 Alimta/pembrolizumab 03/05/2019  Cycle 9 Alimta/pembrolizumab 03/26/2019  CTs 04/14/2019-slight interval decrease in size and conspicuity of multiple, irregular low-attenuation liver lesions. No new liver lesions. No recurrent mediastinal or hilar lymphadenopathy. No change in mildly prominent subcentimeter periaortic and peripheral lymph nodes without evidence of recurrent abdominal or pelvic lymphadenopathy.  Cycle 10 Alimta/pembrolizumab 04/16/2019  Cycle 11 pembrolizumab 05/07/2019 (Alimta held secondary to increased tearing and leg edema)  Cycle 12 Alimta/pembrolizumab 05/28/2019  Cycle 13 Alimta/pembrolizumab 06/18/2019  Restaging CTs 07/08/2019-worsening of low-density hepatic lesions. Scattered small lymph nodes with increasing conspicuity, largest along the left periaortic chain approximately 9 mm previously approximately 5 mm.  Cycle 14 Alimta/pembrolizumab 07/09/2019  Cycle 15 Alimta/pembrolizumab  07/31/2019  Cycle 16 Alimta/pembrolizumab 08/20/2019  Rising CEA beginning December 2020  CTs 09/08/2019-enlargement of liver metastases, mild enlargement of abdominal/retroperitoneal and lower thoracic nodes, nonspecific pulmonary nodules-stable  Cycle 1 gemcitabine 09/17/2019  Cycle 2 gemcitabine 10/01/2019  Cycle 3 gemcitabine 10/15/2019  Cycle 4 gemcitabine 10/29/2019  Cycle 5 gemcitabine 11/12/2019  Rising CEA 10/2019  CTs 11/19/19 mixed response in liver, stable pulmonary nodules, thoracic and RP nodes, overall stable  Plan to intensify chemo carboplatin AUC 4 plus gemcitabine given on day 1 q21 days starting ~12/03/19  Admission 11/30/2019 with recurrent biliary obstruction-ERCP 12/02/2019-obstructed biliary stent, new stent placed  Cycle 1 gemcitabine/carboplatin 12/24/2019  Cycle 2 gemcitabine/carboplatin 01/14/2020  Cycle 3 gemcitabine/carboplatin 02/04/2020  Cycle 4 gemcitabine/carboplatin 02/25/2020  CTs  03/15/2020-decrease in size of hepatic metastatic lesions. No new liver lesions. Newsclerotic lesions in the thoracic and lumbar spine. Stable small pulmonary nodules. Asymmetric wall thickening of the medial wall of the gallbladder new since previous exam without signs of inflammation.  Cycle5gemcitabine/carboplatin 03/17/2020  Cycle 6 gemcitabine/carboplatin 04/15/2020  Cycle 7 gemcitabine/carboplatin 05/09/2020  Cycle 8 gemcitabine/carboplatin 05/31/2020  CTs 06/16/2020-unchanged sclerotic bone lesions, increase in size and number of liver metastases 2. COPD 3. Hypertension 4. Trace edema right leg 12/11/2018. Doppler negative for DVT 12/12/2018. 5. Watery eyes, runny nose reported 12/11/2018. Question related to Alimta. Persistent watery eyes 04/16/2019. Ophthalmology evaluation 05/11/2019-blepharitis,ectropion left lower lid, macular degeneration 6. Admission 12/24/2018 with a fever-no source for infection identified, completed outpatient course of  ciprofloxacin 7. Port-A-Cath placement 09/14/2019, interventional radiology 8. Colonoscopy 06/16/2015-diverticulosis in the sigmoid colon, examination otherwise normal    Disposition: Ricky Conway appears unchanged.  I reviewed the restaging CT findings with him.  I reviewed the CT images.  There is progression of the liver metastases.  We discussed treatment options including comfort care and salvage systemic therapy.  He understands no therapy will be curative.  He would like to proceed with salvage therapy.  I recommend docetaxel/ramucirumab.  We reviewed potential toxicities associated with this regimen including the chance of hematologic toxicity, and allergic reaction, alopecia, mucositis, neuropathy, edema, thromboembolic disease, hypertension, and bowel perforation.  He agrees to proceed.  I recommend G-CSF support.  We reviewed toxicities associated with G-CSF including the chance of bone pain, rash, and splenic rupture.  Mr. Blais will be scheduled for cycle 1 docetaxel/ramucirumab on 06/28/2019.  It is possible the right shoulder and left ankle pain are related to metastatic disease.  He will continue Tylenol as needed.  We will schedule a bone scan or PET scan if the pain progresses.  [name], MD  06/20/2020  9:33 AM

## 2020-06-20 NOTE — Progress Notes (Signed)
DISCONTINUE OFF PATHWAY REGIMEN - Non-Small Cell Lung   [id number]:Gemcitabine 1,000 mg/m2 D1, 8  q21 Days:   A cycle is every 21 days:     Gemcitabine   **Always confirm dose/schedule in your pharmacy ordering system**  REASON: Disease Progression PRIOR TREATMENT: Off Pathway: Gemcitabine 1,000 mg/m2 D1, 8  q21 Days TREATMENT RESPONSE: Progressive Disease (PD)  START ON PATHWAY REGIMEN - Non-Small Cell Lung     A cycle is every 21 days:     Ramucirumab      Docetaxel   **Always confirm dose/schedule in your pharmacy ordering system**  Patient Characteristics: Stage IV Metastatic, Nonsquamous, Third Line - Chemotherapy/Immunotherapy, PS = 0, 1, Prior PD-1/PD-L1 Inhibitor or No Prior PD-1/PD-L1 Inhibitor and Not a Candidate for Immunotherapy Therapeutic Status: Stage IV Metastatic Histology: Nonsquamous Cell ROS1 Rearrangement Status: Negative Other Mutations/Biomarkers: No Other Actionable Mutations Chemotherapy/Immunotherapy LOT: Third Line Chemotherapy/Immunotherapy Molecular Targeted Therapy: Not Appropriate KRAS G12C Mutation Status: Negative MET Exon 14 Mutation Status: Negative RET Gene Fusion Status: Negative EGFR Mutation Status: Negative/Wild Type NTRK Gene Fusion Status: Negative PD-L1 Expression Status: Awaiting Test Results ALK Rearrangement Status: Negative BRAF [id number] Mutation Status: Negative ECOG Performance Status: 1 Immunotherapy Candidate Status: Not a Candidate for Immunotherapy Prior Immunotherapy Status: Prior PD-1/PD-L1 Inhibitor Intent of Therapy: Non-Curative / Palliative Intent, Discussed with Patient

## 2020-06-21 ENCOUNTER — Telehealth: Payer: Self-pay | Admitting: Oncology

## 2020-06-21 NOTE — Progress Notes (Signed)
.  The following biosimilar Udenyca ([id number]) has been selected for use in this patient per insurance requirements.  Henreitta Leber, PharmD

## 2020-06-21 NOTE — Telephone Encounter (Signed)
Scheduled appointments per 12/27 los. Spoke to patient who is aware of appointments dates and times.

## 2020-06-25 ENCOUNTER — Other Ambulatory Visit: Payer: Self-pay | Admitting: Oncology

## 2020-06-27 ENCOUNTER — Inpatient Hospital Stay: Payer: Medicare Other | Attending: Oncology

## 2020-06-27 ENCOUNTER — Inpatient Hospital Stay: Payer: Medicare Other

## 2020-06-27 ENCOUNTER — Inpatient Hospital Stay (HOSPITAL_BASED_OUTPATIENT_CLINIC_OR_DEPARTMENT_OTHER): Payer: Medicare Other | Admitting: Nurse Practitioner

## 2020-06-27 ENCOUNTER — Other Ambulatory Visit: Payer: Self-pay

## 2020-06-27 ENCOUNTER — Encounter: Payer: Self-pay | Admitting: Nurse Practitioner

## 2020-06-27 VITALS — BP 133/60 | HR 76 | Temp 98.9°F | Resp 18 | Ht 71.0 in | Wt 180.2 lb

## 2020-06-27 VITALS — BP 167/72 | HR 75 | Temp 98.1°F | Resp 20

## 2020-06-27 DIAGNOSIS — C787 Secondary malignant neoplasm of liver and intrahepatic bile duct: Secondary | ICD-10-CM | POA: Insufficient documentation

## 2020-06-27 DIAGNOSIS — Z79899 Other long term (current) drug therapy: Secondary | ICD-10-CM | POA: Diagnosis not present

## 2020-06-27 DIAGNOSIS — Z95828 Presence of other vascular implants and grafts: Secondary | ICD-10-CM

## 2020-06-27 DIAGNOSIS — R5383 Other fatigue: Secondary | ICD-10-CM | POA: Diagnosis not present

## 2020-06-27 DIAGNOSIS — Z5111 Encounter for antineoplastic chemotherapy: Secondary | ICD-10-CM | POA: Diagnosis present

## 2020-06-27 DIAGNOSIS — C3490 Malignant neoplasm of unspecified part of unspecified bronchus or lung: Secondary | ICD-10-CM | POA: Diagnosis not present

## 2020-06-27 DIAGNOSIS — Z5189 Encounter for other specified aftercare: Secondary | ICD-10-CM | POA: Diagnosis not present

## 2020-06-27 DIAGNOSIS — C3431 Malignant neoplasm of lower lobe, right bronchus or lung: Secondary | ICD-10-CM | POA: Diagnosis present

## 2020-06-27 LAB — CMP (CANCER CENTER ONLY)
ALT: 85 U/L — ABNORMAL HIGH (ref 0–44)
AST: 80 U/L — ABNORMAL HIGH (ref 15–41)
Albumin: 3.3 g/dL — ABNORMAL LOW (ref 3.5–5.0)
Alkaline Phosphatase: 165 U/L — ABNORMAL HIGH (ref 38–126)
Anion gap: 8 (ref 5–15)
BUN: 10 mg/dL (ref 8–23)
CO2: 29 mmol/L (ref 22–32)
Calcium: 9.4 mg/dL (ref 8.9–10.3)
Chloride: 96 mmol/L — ABNORMAL LOW (ref 98–111)
Creatinine: 0.67 mg/dL (ref 0.61–1.24)
GFR, Estimated: >60 mL/min (ref >60)
Glucose, Bld: 96 mg/dL (ref 70–99)
Potassium: 4.1 mmol/L (ref 3.5–5.1)
Sodium: 133 mmol/L — ABNORMAL LOW (ref 135–145)
Total Bilirubin: 0.7 mg/dL (ref 0.3–1.2)
Total Protein: 7.4 g/dL (ref 6.5–8.1)

## 2020-06-27 LAB — CBC WITH DIFFERENTIAL (CANCER CENTER ONLY)
Abs Immature Granulocytes: 0.07 10*3/uL (ref 0.00–0.07)
Basophils Absolute: 0.1 10*3/uL (ref 0.0–0.1)
Basophils Relative: 1 %
Eosinophils Absolute: 0.1 10*3/uL (ref 0.0–0.5)
Eosinophils Relative: 1 %
HCT: 40.9 % (ref 39.0–52.0)
Hemoglobin: 14.1 g/dL (ref 13.0–17.0)
Immature Granulocytes: 1 %
Lymphocytes Relative: 9 %
Lymphs Abs: 1.1 10*3/uL (ref 0.7–4.0)
MCH: 32.9 pg (ref 26.0–34.0)
MCHC: 34.5 g/dL (ref 30.0–36.0)
MCV: 95.3 fL (ref 80.0–100.0)
Monocytes Absolute: 1.7 10*3/uL — ABNORMAL HIGH (ref 0.1–1.0)
Monocytes Relative: 14 %
Neutro Abs: 8.8 10*3/uL — ABNORMAL HIGH (ref 1.7–7.7)
Neutrophils Relative %: 74 %
Platelet Count: 282 10*3/uL (ref 150–400)
RBC: 4.29 MIL/uL (ref 4.22–5.81)
RDW: 16.7 % — ABNORMAL HIGH (ref 11.5–15.5)
WBC Count: 11.9 10*3/uL — ABNORMAL HIGH (ref 4.0–10.5)
nRBC: 0 % (ref 0.0–0.2)

## 2020-06-27 LAB — CEA (IN HOUSE-CHCC): CEA (CHCC-In House): 9896.93 ng/mL — ABNORMAL HIGH (ref 0.00–5.00)

## 2020-06-27 MED ORDER — ACETAMINOPHEN 325 MG PO TABS
650.0000 mg | ORAL_TABLET | Freq: Once | ORAL | Status: AC
Start: 2020-06-27 — End: 2020-06-27
  Administered 2020-06-27: 650 mg via ORAL

## 2020-06-27 MED ORDER — DIPHENHYDRAMINE HCL 50 MG/ML IJ SOLN
25.0000 mg | Freq: Once | INTRAMUSCULAR | Status: AC
  Administered 2020-06-27: 25 mg via INTRAVENOUS

## 2020-06-27 MED ORDER — SODIUM CHLORIDE 0.9 % IV SOLN
10.0000 mg | Freq: Once | INTRAVENOUS | Status: AC
  Administered 2020-06-27: 10 mg via INTRAVENOUS
  Filled 2020-06-27: qty 10

## 2020-06-27 MED ORDER — SODIUM CHLORIDE 0.9 % IV SOLN
60.0000 mg/m2 | Freq: Once | INTRAVENOUS | Status: AC
  Administered 2020-06-27: 120 mg via INTRAVENOUS
  Filled 2020-06-27: qty 12

## 2020-06-27 MED ORDER — DIPHENHYDRAMINE HCL 50 MG/ML IJ SOLN
INTRAMUSCULAR | Status: AC
  Filled 2020-06-27: qty 1

## 2020-06-27 MED ORDER — SODIUM CHLORIDE 0.9 % IV SOLN
Freq: Once | INTRAVENOUS | Status: AC
  Filled 2020-06-27: qty 250

## 2020-06-27 MED ORDER — SODIUM CHLORIDE 0.9% FLUSH
10.0000 mL | INTRAVENOUS | Status: DC | PRN
  Filled 2020-06-27: qty 10

## 2020-06-27 MED ORDER — SODIUM CHLORIDE 0.9% FLUSH
10.0000 mL | INTRAVENOUS | Status: DC | PRN
  Administered 2020-06-27: 10 mL
  Filled 2020-06-27: qty 10

## 2020-06-27 MED ORDER — HEPARIN SOD (PORK) LOCK FLUSH 100 UNIT/ML IV SOLN
500.0000 [IU] | Freq: Once | INTRAVENOUS | Status: DC | PRN
  Filled 2020-06-27: qty 5

## 2020-06-27 MED ORDER — SODIUM CHLORIDE 0.9 % IV SOLN
10.0000 mg/kg | Freq: Once | INTRAVENOUS | Status: AC
  Administered 2020-06-27: 800 mg via INTRAVENOUS
  Filled 2020-06-27: qty 50

## 2020-06-27 MED ORDER — ACETAMINOPHEN 325 MG PO TABS
ORAL_TABLET | ORAL | Status: AC
  Filled 2020-06-27: qty 2

## 2020-06-27 NOTE — Patient Instructions (Signed)
[facility] Discharge Instructions for Patients Receiving Chemotherapy  Today you received the following chemotherapy agents Cyramza and Taxotere.  To help prevent nausea and vomiting after your treatment, we encourage you to take your nausea medication as directed.  If you develop nausea and vomiting that is not controlled by your nausea medication, call the clinic.   BELOW ARE SYMPTOMS THAT SHOULD BE REPORTED IMMEDIATELY:  *FEVER GREATER THAN 100.5 F  *CHILLS WITH OR WITHOUT FEVER  NAUSEA AND VOMITING THAT IS NOT CONTROLLED WITH YOUR NAUSEA MEDICATION  *UNUSUAL SHORTNESS OF BREATH  *UNUSUAL BRUISING OR BLEEDING  TENDERNESS IN MOUTH AND THROAT WITH OR WITHOUT PRESENCE OF ULCERS  *URINARY PROBLEMS  *BOWEL PROBLEMS  UNUSUAL RASH Items with * indicate a potential emergency and should be followed up as soon as possible.  Feel free to call the clinic should you have any questions or concerns. The clinic phone number is [phone].  Please show the [facility] at check-in to the Emergency Department and triage nurse. Ramucirumab injection What is this medicine? RAMUCIRUMAB (ra mue SIR ue mab) is a monoclonal antibody. It is used to treat stomach cancer, colorectal cancer, liver cancer, and lung cancer. This medicine may be used for other purposes; ask your health care provider or pharmacist if you have questions. COMMON BRAND NAME(S): Cyramza What should I tell my health care provider before I take this medicine? They need to know if you have any of these conditions:  bleeding disorders  blood clots  heart disease, including heart failure, heart attack, or chest pain (angina)  high blood pressure  infection (especially a virus infection such as chickenpox, cold sores, or herpes)  protein in your urine  recent or planning to have surgery  stroke  an unusual or allergic reaction to ramucirumab, other medicines, foods, dyes, or  preservatives  pregnant or trying to get pregnant  breast-feeding How should I use this medicine? This medicine is for infusion into a vein. It is given by a health care professional in a hospital or clinic setting. Talk to your pediatrician regarding the use of this medicine in children. Special care may be needed. Overdosage: If you think you have taken too much of this medicine contact a poison control center or emergency room at once. NOTE: This medicine is only for you. Do not share this medicine with others. What if I miss a dose? It is important not to miss your dose. Call your doctor or health care professional if you are unable to keep an appointment. What may interact with this medicine? Interactions have not been studied. This list may not describe all possible interactions. Give your health care provider a list of all the medicines, herbs, non-prescription drugs, or dietary supplements you use. Also tell them if you smoke, drink alcohol, or use illegal drugs. Some items may interact with your medicine. What should I watch for while using this medicine? Your condition will be monitored carefully while you are receiving this medicine. You will need to to check your blood pressure and have your blood and urine tested while you are taking this medicine. Your condition will be monitored carefully while you are receiving this medicine. This medicine may increase your risk to bruise or bleed. Call your doctor or health care professional if you notice any unusual bleeding. Before having surgery, talk to your health care provider to make sure it is ok. This drug can increase the risk of poor healing of your surgical site  or wound. You will need to stop this drug for 28 days before surgery. After surgery, wait at least 2 weeks before restarting this drug. Make sure the surgical site or wound is healed enough before restarting this drug. Talk to your health care provider if questions. Do not  become pregnant while taking this medicine or for 3 months after stopping it. Women should inform their doctor if they wish to become pregnant or think they might be pregnant. There is a potential for serious side effects to an unborn child. Talk to your health care professional or pharmacist for more information. Do not breast-feed an infant while taking this medicine or for 2 months after stopping it. This medicine may interfere with the ability to have a child. Talk with your doctor or health care professional if you are concerned about your fertility. What side effects may I notice from receiving this medicine? Side effects that you should report to your doctor or health care professional as soon as possible:  allergic reactions like skin rash, itching or hives, breathing problems, swelling of the face, lips, or tongue  signs of infection - fever or chills, cough, sore throat  chest pain or chest tightness  confusion  dizziness  feeling faint or lightheaded, falls  severe abdominal pain  severe nausea, vomiting  signs and symptoms of bleeding such as bloody or black, tarry stools; red or dark-brown urine; spitting up blood or brown material that looks like coffee grounds; red spots on the skin; unusual bruising or bleeding from the eye, gums, or nose  signs and symptoms of a blood clot such as breathing problems; changes in vision; chest pain; severe, sudden headache; pain, swelling, warmth in the leg; trouble speaking; sudden numbness or weakness of the face, arm or leg  symptoms of a stroke: change in mental awareness, inability to talk or move one side of the body  trouble walking, dizziness, loss of balance or coordination Side effects that usually do not require medical attention (report to your doctor or health care professional if they continue or are bothersome):  cold, clammy skin  constipation  diarrhea  headache  nausea, vomiting  stomach pain  unusually slow  heartbeat  unusually weak or tired This list may not describe all possible side effects. Call your doctor for medical advice about side effects. You may report side effects to FDA at 1-800-FDA-1088. Where should I keep my medicine? This drug is given in a hospital or clinic and will not be stored at home. NOTE: This sheet is a summary. It may not cover all possible information. If you have questions about this medicine, talk to your doctor, pharmacist, or health care provider.  2020 Elsevier/Gold Standard (2019-04-08 11:17:50)  Docetaxel injection What is this medicine? DOCETAXEL (doe se TAX el) is a chemotherapy drug. It targets fast dividing cells, like cancer cells, and causes these cells to die. This medicine is used to treat many types of cancers like breast cancer, certain stomach cancers, head and neck cancer, lung cancer, and prostate cancer. This medicine may be used for other purposes; ask your health care provider or pharmacist if you have questions. COMMON BRAND NAME(S): Docefrez, Taxotere What should I tell my health care provider before I take this medicine? They need to know if you have any of these conditions:  infection (especially a virus infection such as chickenpox, cold sores, or herpes)  liver disease  low blood counts, like low white cell, platelet, or red cell counts  an unusual or allergic reaction to docetaxel, polysorbate 80, other chemotherapy agents, other medicines, foods, dyes, or preservatives  pregnant or trying to get pregnant  breast-feeding How should I use this medicine? This drug is given as an infusion into a vein. It is administered in a hospital or clinic by a specially trained health care professional. Talk to your pediatrician regarding the use of this medicine in children. Special care may be needed. Overdosage: If you think you have taken too much of this medicine contact a poison control center or emergency room at once. NOTE: This  medicine is only for you. Do not share this medicine with others. What if I miss a dose? It is important not to miss your dose. Call your doctor or health care professional if you are unable to keep an appointment. What may interact with this medicine?  aprepitant  certain antibiotics like erythromycin or clarithromycin  certain antivirals for HIV or hepatitis  certain medicines for fungal infections like fluconazole, itraconazole, ketoconazole, posaconazole, or voriconazole  cimetidine  ciprofloxacin  conivaptan  cyclosporine  dronedarone  fluvoxamine  grapefruit juice  imatinib  verapamil This list may not describe all possible interactions. Give your health care provider a list of all the medicines, herbs, non-prescription drugs, or dietary supplements you use. Also tell them if you smoke, drink alcohol, or use illegal drugs. Some items may interact with your medicine. What should I watch for while using this medicine? Your condition will be monitored carefully while you are receiving this medicine. You will need important blood work done while you are taking this medicine. Call your doctor or health care professional for advice if you get a fever, chills or sore throat, or other symptoms of a cold or flu. Do not treat yourself. This drug decreases your body's ability to fight infections. Try to avoid being around people who are sick. Some products may contain alcohol. Ask your health care professional if this medicine contains alcohol. Be sure to tell all health care professionals you are taking this medicine. Certain medicines, like metronidazole and disulfiram, can cause an unpleasant reaction when taken with alcohol. The reaction includes flushing, headache, nausea, vomiting, sweating, and increased thirst. The reaction can last from 30 minutes to several hours. You may get drowsy or dizzy. Do not drive, use machinery, or do anything that needs mental alertness until you  know how this medicine affects you. Do not stand or sit up quickly, especially if you are an older patient. This reduces the risk of dizzy or fainting spells. Alcohol may interfere with the effect of this medicine. Talk to your health care professional about your risk of cancer. You may be more at risk for certain types of cancer if you take this medicine. Do not become pregnant while taking this medicine or for 6 months after stopping it. Women should inform their doctor if they wish to become pregnant or think they might be pregnant. There is a potential for serious side effects to an unborn child. Talk to your health care professional or pharmacist for more information. Do not breast-feed an infant while taking this medicine or for 1 week after stopping it. Males who get this medicine must use a condom during sex with females who can get pregnant. If you get a woman pregnant, the baby could have birth defects. The baby could die before they are born. You will need to continue wearing a condom for 3 months after stopping the medicine. Tell your health care  provider right away if your partner becomes pregnant while you are taking this medicine. This may interfere with the ability to father a child. You should talk to your doctor or health care professional if you are concerned about your fertility. What side effects may I notice from receiving this medicine? Side effects that you should report to your doctor or health care professional as soon as possible:  allergic reactions like skin rash, itching or hives, swelling of the face, lips, or tongue  blurred vision  breathing problems  changes in vision  low blood counts - This drug may decrease the number of white blood cells, red blood cells and platelets. You may be at increased risk for infections and bleeding.  nausea and vomiting  pain, redness or irritation at site where injected  pain, tingling, numbness in the hands or feet  redness,  blistering, peeling, or loosening of the skin, including inside the mouth  signs of decreased platelets or bleeding - bruising, pinpoint red spots on the skin, black, tarry stools, nosebleeds  signs of decreased red blood cells - unusually weak or tired, fainting spells, lightheadedness  signs of infection - fever or chills, cough, sore throat, pain or difficulty passing urine  swelling of the ankle, feet, hands Side effects that usually do not require medical attention (report to your doctor or health care professional if they continue or are bothersome):  constipation  diarrhea  fingernail or toenail changes  hair loss  loss of appetite  mouth sores  muscle pain This list may not describe all possible side effects. Call your doctor for medical advice about side effects. You may report side effects to FDA at 1-800-FDA-1088. Where should I keep my medicine? This drug is given in a hospital or clinic and will not be stored at home. NOTE: This sheet is a summary. It may not cover all possible information. If you have questions about this medicine, talk to your doctor, pharmacist, or health care provider.  2020 Elsevier/Gold Standard (2019-02-05 10:19:06)

## 2020-06-27 NOTE — Progress Notes (Signed)
Per Ned Card, NP it is ok to treat with today's lab results.

## 2020-06-27 NOTE — Progress Notes (Addendum)
Ricky Conway   Diagnosis: Lung cancer  INTERVAL HISTORY:   Ricky Conway returns as scheduled.  No change in baseline dyspnea.  He continues supplemental oxygen.  He continues to have right shoulder pain, unchanged.  He has "a little bit" of pain at the left ankle.  He reports a good appetite.  Objective:  Vital signs in last 24 hours:  Blood pressure 133/60, pulse 76, temperature 98.9 F (37.2 C), temperature source Tympanic, resp. rate 18, height _0  (1.803 m), weight 180 lb 3.2 oz (81.7 kg), SpO2 93 %.    Resp: Distant breath sounds.  No respiratory distress. Cardio: Regular rate and rhythm. GI: No hepatomegaly. Vascular: No leg edema.  Port-A-Cath without erythema.  Lab Results:  Lab Results  Component Value Date   WBC 11.9 (H) 06/27/2020   HGB 14.1 06/27/2020   HCT 40.9 06/27/2020   MCV 95.3 06/27/2020   PLT 282 06/27/2020   NEUTROABS 8.8 (H) 06/27/2020    Imaging:  No results found.  Medications: I have reviewed the patient's current medications.  Assessment/Plan: 1. Pancreas mass, liver lesions  Abdominal ultrasound 09/03/2018-3 liver lesions. Intra-and extrahepatic biliary ductal dilatation.  Presentation to the emergency department 09/08/2018 with painless jaundice. Initial bilirubin returned at 18.3.   MRI abdomen 09/08/2018-1.9 x 2.6 cm hypoenhancing lesion arising exophytically along the posterior aspect of the pancreatic head suspicious for pancreatic adenocarcinoma. There was suspected involvement of the distal common duct with secondary intrahepatic/extrahepatic ductal dilatation. Lesion noted to abut the IVC. Multiple liver metastases noted. Small upper abdominal/retroperitoneal lymph nodes noted.   ERCP 09/09/2018-high-grade stricture and obstruction of the distal common bile duct. A metallic biliary stent was placed. Brushings in the mid and lower third of the main bile duct were obtained with no malignant  cells identified.   Bilirubin improved at 4.7 on 09/10/2018.   Biopsy of a liver lesion on 09/11/2018-metastatic carcinoma, cytokeratin 7 and TTF-1 positive, negative for cytokeratin 20, CDX 2, and PSA. Foundation 1-microsatellite stable, tumor mutation burden 16, ERBB2 amplification  CT chest 09/22/2018-right hilar/mediastinal/retrocrural lymphadenopathy, ill-defined right lower lobe nodularity, emphysema  Cycle 1 carboplatin/Alimta/pembrolizumab 10/09/2018  Cycle 2 carboplatin/Alimta/pembrolizumab 10/30/2018  Cycle 3 carboplatin/Alimta/pembrolizumab 11/20/2018  Cycle 4 carboplatin/Alimta/pembrolizumab 12/11/2018  CT 12/26/2018-marked response to therapy of the radical abdominal adenopathy, decreased size of hepatic metastases, no new or progressive disease  Cycle 5 Alimta/pembrolizumab 01/01/2019  Cycle 6 Alimta/pembrolizumab 01/23/2019  Cycle 7 Alimta/pembrolizumab 02/12/2019  Cycle 8 Alimta/pembrolizumab 03/05/2019  Cycle 9 Alimta/pembrolizumab 03/26/2019  CTs 04/14/2019-slight interval decrease in size and conspicuity of multiple, irregular low-attenuation liver lesions. No new liver lesions. No recurrent mediastinal or hilar lymphadenopathy. No change in mildly prominent subcentimeter periaortic and peripheral lymph nodes without evidence of recurrent abdominal or pelvic lymphadenopathy.  Cycle 10 Alimta/pembrolizumab 04/16/2019  Cycle 11 pembrolizumab 05/07/2019 (Alimta held secondary to increased tearing and leg edema)  Cycle 12 Alimta/pembrolizumab 05/28/2019  Cycle 13 Alimta/pembrolizumab 06/18/2019  Restaging CTs 07/08/2019-worsening of low-density hepatic lesions. Scattered small lymph nodes with increasing conspicuity, largest along the left periaortic chain approximately 9 mm previously approximately 5 mm.  Cycle 14 Alimta/pembrolizumab 07/09/2019  Cycle 15 Alimta/pembrolizumab 07/31/2019  Cycle 16 Alimta/pembrolizumab 08/20/2019  Rising CEA beginning December  2020  CTs 09/08/2019-enlargement of liver metastases, mild enlargement of abdominal/retroperitoneal and lower thoracic nodes, nonspecific pulmonary nodules-stable  Cycle 1 gemcitabine 09/17/2019  Cycle 2 gemcitabine 10/01/2019  Cycle 3 gemcitabine 10/15/2019  Cycle 4 gemcitabine 10/29/2019  Cycle 5 gemcitabine 11/12/2019  Rising CEA 10/2019  CTs 11/19/19 mixed response in liver, stable pulmonary nodules, thoracic and RP nodes, overall stable  Plan to intensify chemo carboplatin AUC 4 plus gemcitabine given on day 1 q21 days starting ~12/03/19  Admission 11/30/2019 with recurrent biliary obstruction-ERCP 12/02/2019-obstructed biliary stent, new stent placed  Cycle 1 gemcitabine/carboplatin 12/24/2019  Cycle 2 gemcitabine/carboplatin 01/14/2020  Cycle 3 gemcitabine/carboplatin 02/04/2020  Cycle 4 gemcitabine/carboplatin 02/25/2020  CTs 03/15/2020-decrease in size of hepatic metastatic lesions. No new liver lesions. Newsclerotic lesions in the thoracic and lumbar spine. Stable small pulmonary nodules. Asymmetric wall thickening of the medial wall of the gallbladder new since previous exam without signs of inflammation.  Cycle5gemcitabine/carboplatin 03/17/2020  Cycle 6 gemcitabine/carboplatin 04/15/2020  Cycle 7 gemcitabine/carboplatin 05/09/2020  Cycle 8 gemcitabine/carboplatin 05/31/2020  CTs 06/16/2020-unchanged sclerotic bone lesions, increase in size and number of liver metastases  Cycle 1 docetaxel/ramucirumab 06/27/2020 2. COPD 3. Hypertension 4. Trace edema right leg 12/11/2018. Doppler negative for DVT 12/12/2018. 5. Watery eyes, runny nose reported 12/11/2018. Question related to Alimta. Persistent watery eyes 04/16/2019. Ophthalmology evaluation 05/11/2019-blepharitis,ectropion left lower lid, macular degeneration 6. Admission 12/24/2018 with a fever-no source for infection identified, completed outpatient course of ciprofloxacin 7. Port-A-Cath placement 09/14/2019, interventional  radiology 8. Colonoscopy 06/16/2015-diverticulosis in the sigmoid colon, examination otherwise normal    Disposition: Ricky Conway appears unchanged.  He is scheduled for cycle 1 docetaxel/ramucirumab today.  We again reviewed potential toxicities.  He understands that no therapy will be curative.  He would like to proceed as planned. He will receive white cell growth factor support 06/28/2020.  We reviewed the CBC and chemistry panel from today. Labs adequate to proceed as above. We will continue to monitor transaminases which are mildly elevated today.  He will return for lab, follow-up, cycle 2 docetaxel/ramucirumab in 3 weeks. He will contact the office in the interim with any problems.  Patient seen with Dr. Benay Spice.    Ned Card ANP/GNP-BC   06/27/2020  11:59 AM This was a shared visit with Ned Card.  Mr. Grover will complete cycle 1 docetaxel/ramucirumab today.  We discussed the prognosis and treatment options again today.  We discussed comfort care versus a trial of salvage systemic therapy.  He would like to proceed with docetaxel/ramucirumab.  Julieanne Manson, MD

## 2020-06-27 NOTE — Progress Notes (Signed)
Pt discharged in no apparent distress. Pt left ambulatory without assistance. Pt aware of discharge instructions and verbalized understanding and had no further questions.  

## 2020-06-28 ENCOUNTER — Ambulatory Visit: Payer: Medicare Other

## 2020-06-28 ENCOUNTER — Telehealth: Payer: Self-pay | Admitting: Nurse Practitioner

## 2020-06-28 NOTE — Telephone Encounter (Signed)
Scheduled appointments per 1/3 los. Spoke to patient who is aware of appointments dates and times. Will have updated calendar printed for patient at next visit.

## 2020-06-29 ENCOUNTER — Other Ambulatory Visit: Payer: Self-pay

## 2020-06-29 ENCOUNTER — Inpatient Hospital Stay: Payer: Medicare Other

## 2020-06-29 VITALS — BP 132/73 | HR 80 | Resp 20

## 2020-06-29 DIAGNOSIS — Z5111 Encounter for antineoplastic chemotherapy: Secondary | ICD-10-CM | POA: Diagnosis not present

## 2020-06-29 DIAGNOSIS — C3490 Malignant neoplasm of unspecified part of unspecified bronchus or lung: Secondary | ICD-10-CM

## 2020-06-29 MED ORDER — PEGFILGRASTIM-CBQV 6 MG/0.6ML ~~LOC~~ SOSY
6.0000 mg | PREFILLED_SYRINGE | Freq: Once | SUBCUTANEOUS | Status: AC
  Administered 2020-06-29: 6 mg via SUBCUTANEOUS

## 2020-06-29 MED ORDER — PEGFILGRASTIM-CBQV 6 MG/0.6ML ~~LOC~~ SOSY
PREFILLED_SYRINGE | SUBCUTANEOUS | Status: AC
  Filled 2020-06-29: qty 0.6

## 2020-06-29 NOTE — Patient Instructions (Signed)

## 2020-07-12 ENCOUNTER — Ambulatory Visit: Payer: Medicare Other | Admitting: Oncology

## 2020-07-12 ENCOUNTER — Ambulatory Visit: Payer: Medicare Other

## 2020-07-12 ENCOUNTER — Other Ambulatory Visit: Payer: Medicare Other

## 2020-07-17 ENCOUNTER — Other Ambulatory Visit: Payer: Self-pay | Admitting: Oncology

## 2020-07-18 ENCOUNTER — Other Ambulatory Visit: Payer: Medicare Other

## 2020-07-18 ENCOUNTER — Inpatient Hospital Stay: Payer: Medicare Other

## 2020-07-18 ENCOUNTER — Other Ambulatory Visit: Payer: Self-pay

## 2020-07-18 ENCOUNTER — Inpatient Hospital Stay (HOSPITAL_BASED_OUTPATIENT_CLINIC_OR_DEPARTMENT_OTHER): Payer: Medicare Other | Admitting: Oncology

## 2020-07-18 ENCOUNTER — Ambulatory Visit: Payer: Medicare Other | Admitting: Oncology

## 2020-07-18 ENCOUNTER — Ambulatory Visit: Payer: Medicare Other

## 2020-07-18 VITALS — BP 146/88 | HR 87 | Temp 98.9°F | Resp 17 | Ht 71.0 in | Wt 182.3 lb

## 2020-07-18 DIAGNOSIS — Z5111 Encounter for antineoplastic chemotherapy: Secondary | ICD-10-CM | POA: Diagnosis not present

## 2020-07-18 DIAGNOSIS — C3490 Malignant neoplasm of unspecified part of unspecified bronchus or lung: Secondary | ICD-10-CM

## 2020-07-18 DIAGNOSIS — R5383 Other fatigue: Secondary | ICD-10-CM

## 2020-07-18 DIAGNOSIS — Z95828 Presence of other vascular implants and grafts: Secondary | ICD-10-CM

## 2020-07-18 LAB — CBC WITH DIFFERENTIAL (CANCER CENTER ONLY)
Abs Immature Granulocytes: 0.06 10*3/uL (ref 0.00–0.07)
Basophils Absolute: 0.1 10*3/uL (ref 0.0–0.1)
Basophils Relative: 1 %
Eosinophils Absolute: 0 10*3/uL (ref 0.0–0.5)
Eosinophils Relative: 0 %
HCT: 43.7 % (ref 39.0–52.0)
Hemoglobin: 14.7 g/dL (ref 13.0–17.0)
Immature Granulocytes: 1 %
Lymphocytes Relative: 9 %
Lymphs Abs: 1.1 10*3/uL (ref 0.7–4.0)
MCH: 32.3 pg (ref 26.0–34.0)
MCHC: 33.6 g/dL (ref 30.0–36.0)
MCV: 96 fL (ref 80.0–100.0)
Monocytes Absolute: 1.2 10*3/uL — ABNORMAL HIGH (ref 0.1–1.0)
Monocytes Relative: 10 %
Neutro Abs: 9.6 10*3/uL — ABNORMAL HIGH (ref 1.7–7.7)
Neutrophils Relative %: 79 %
Platelet Count: 302 10*3/uL (ref 150–400)
RBC: 4.55 MIL/uL (ref 4.22–5.81)
RDW: 16.8 % — ABNORMAL HIGH (ref 11.5–15.5)
WBC Count: 12.1 10*3/uL — ABNORMAL HIGH (ref 4.0–10.5)
nRBC: 0 % (ref 0.0–0.2)

## 2020-07-18 LAB — CMP (CANCER CENTER ONLY)
ALT: 20 U/L (ref 0–44)
AST: 25 U/L (ref 15–41)
Albumin: 3.1 g/dL — ABNORMAL LOW (ref 3.5–5.0)
Alkaline Phosphatase: 105 U/L (ref 38–126)
Anion gap: 8 (ref 5–15)
BUN: 13 mg/dL (ref 8–23)
CO2: 29 mmol/L (ref 22–32)
Calcium: 9 mg/dL (ref 8.9–10.3)
Chloride: 99 mmol/L (ref 98–111)
Creatinine: 0.64 mg/dL (ref 0.61–1.24)
GFR, Estimated: >60 mL/min (ref >60)
Glucose, Bld: 107 mg/dL — ABNORMAL HIGH (ref 70–99)
Potassium: 3.8 mmol/L (ref 3.5–5.1)
Sodium: 136 mmol/L (ref 135–145)
Total Bilirubin: 0.5 mg/dL (ref 0.3–1.2)
Total Protein: 6.9 g/dL (ref 6.5–8.1)

## 2020-07-18 LAB — TOTAL PROTEIN, URINE DIPSTICK: Protein, ur: <30 mg/dL — AB

## 2020-07-18 LAB — TSH: TSH: 3.34 u[IU]/mL (ref 0.320–4.118)

## 2020-07-18 MED ORDER — SODIUM CHLORIDE 0.9% FLUSH
10.0000 mL | INTRAVENOUS | Status: DC | PRN
  Administered 2020-07-18: 10 mL
  Filled 2020-07-18: qty 10

## 2020-07-18 MED ORDER — ACETAMINOPHEN 325 MG PO TABS
650.0000 mg | ORAL_TABLET | Freq: Once | ORAL | Status: AC
  Administered 2020-07-18: 650 mg via ORAL

## 2020-07-18 MED ORDER — SODIUM CHLORIDE 0.9 % IV SOLN
10.0000 mg | Freq: Once | INTRAVENOUS | Status: AC
  Administered 2020-07-18: 10 mg via INTRAVENOUS
  Filled 2020-07-18: qty 10

## 2020-07-18 MED ORDER — RAMUCIRUMAB CHEMO INJECTION 100 MG/10ML
10.0000 mg/kg | Freq: Once | INTRAVENOUS | Status: AC
  Administered 2020-07-18: 800 mg via INTRAVENOUS
  Filled 2020-07-18: qty 50

## 2020-07-18 MED ORDER — ACETAMINOPHEN 325 MG PO TABS
ORAL_TABLET | ORAL | Status: AC
  Filled 2020-07-18: qty 2

## 2020-07-18 MED ORDER — HEPARIN SOD (PORK) LOCK FLUSH 100 UNIT/ML IV SOLN
500.0000 [IU] | Freq: Once | INTRAVENOUS | Status: AC | PRN
Start: 2020-07-18 — End: 2020-07-18
  Administered 2020-07-18: 500 [IU]
  Filled 2020-07-18: qty 5

## 2020-07-18 MED ORDER — DIPHENHYDRAMINE HCL 50 MG/ML IJ SOLN
INTRAMUSCULAR | Status: AC
  Filled 2020-07-18: qty 1

## 2020-07-18 MED ORDER — DIPHENHYDRAMINE HCL 50 MG/ML IJ SOLN
25.0000 mg | Freq: Once | INTRAMUSCULAR | Status: AC
  Administered 2020-07-18: 25 mg via INTRAVENOUS

## 2020-07-18 MED ORDER — SODIUM CHLORIDE 0.9 % IV SOLN
60.0000 mg/m2 | Freq: Once | INTRAVENOUS | Status: AC
  Administered 2020-07-18: 120 mg via INTRAVENOUS
  Filled 2020-07-18: qty 12

## 2020-07-18 MED ORDER — SODIUM CHLORIDE 0.9 % IV SOLN
Freq: Once | INTRAVENOUS | Status: AC
  Filled 2020-07-18: qty 250

## 2020-07-18 NOTE — Patient Instructions (Signed)

## 2020-07-18 NOTE — Progress Notes (Signed)
[place] OFFICE PROGRESS NOTE   Diagnosis: Non-small cell lung cancer  INTERVAL HISTORY:   Ricky Conway completed a cycle of Taxotere and ramucirumab on 06/27/2020.  Udenyca on 06/29/2020.  No nausea/vomiting, neuropathy symptoms, mouth sores, or bone pain following chemotherapy.  No bleeding or symptom of thrombosis.  Good appetite.  Objective:  Vital signs in last 24 hours:  Blood pressure (!) 146/88, pulse 87, temperature 98.9 F (37.2 C), temperature source Temporal, resp. rate 17, height '5\' 11"'  (1.803 m), weight 182 lb 4.8 oz (82.7 kg), SpO2 92 %.    HEENT: No thrush or ulcers Resp: Distant breath sounds, no respiratory distress Cardio: Regular rate and rhythm GI: No hepatomegaly, nontender Vascular: Trace pitting edema at the right greater than left lower leg   Skin: Palms without erythema  Portacath/PICC-without erythema  Lab Results:  Lab Results  Component Value Date   WBC 12.1 (H) 07/18/2020   HGB 14.7 07/18/2020   HCT 43.7 07/18/2020   MCV 96.0 07/18/2020   PLT 302 07/18/2020   NEUTROABS 9.6 (H) 07/18/2020    CMP  Lab Results  Component Value Date   NA 133 (L) 06/27/2020   K 4.1 06/27/2020   CL 96 (L) 06/27/2020   CO2 29 06/27/2020   GLUCOSE 96 06/27/2020   BUN 10 06/27/2020   CREATININE 0.67 06/27/2020   CALCIUM 9.4 06/27/2020   PROT 7.4 06/27/2020   ALBUMIN 3.3 (L) 06/27/2020   AST 80 (H) 06/27/2020   ALT 85 (H) 06/27/2020   ALKPHOS 165 (H) 06/27/2020   BILITOT 0.7 06/27/2020   GFRNONAA >60 06/27/2020   GFRAA >60 03/15/2020    Lab Results  Component Value Date   CEA1 8,882.80 (H) 06/27/2020     Medications: I have reviewed the patient's current medications.   Assessment/Plan: 1. Pancreas mass, liver lesions  Abdominal ultrasound 09/03/2018-3 liver lesions. Intra-and extrahepatic biliary ductal dilatation.  Presentation to the emergency department 09/08/2018 with painless jaundice. Initial bilirubin returned at 18.3.    MRI abdomen 09/08/2018-1.9 x 2.6 cm hypoenhancing lesion arising exophytically along the posterior aspect of the pancreatic head suspicious for pancreatic adenocarcinoma. There was suspected involvement of the distal common duct with secondary intrahepatic/extrahepatic ductal dilatation. Lesion noted to abut the IVC. Multiple liver metastases noted. Small upper abdominal/retroperitoneal lymph nodes noted.   ERCP 09/09/2018-high-grade stricture and obstruction of the distal common bile duct. A metallic biliary stent was placed. Brushings in the mid and lower third of the main bile duct were obtained with no malignant cells identified.   Bilirubin improved at 4.7 on 09/10/2018.   Biopsy of a liver lesion on 09/11/2018-metastatic carcinoma, cytokeratin 7 and TTF-1 positive, negative for cytokeratin 20, CDX 2, and PSA. Foundation 1-microsatellite stable, tumor mutation burden 16, ERBB2 amplification  CT chest 09/22/2018-right hilar/mediastinal/retrocrural lymphadenopathy, ill-defined right lower lobe nodularity, emphysema  Cycle 1 carboplatin/Alimta/pembrolizumab 10/09/2018  Cycle 2 carboplatin/Alimta/pembrolizumab 10/30/2018  Cycle 3 carboplatin/Alimta/pembrolizumab 11/20/2018  Cycle 4 carboplatin/Alimta/pembrolizumab 12/11/2018  CT 12/26/2018-marked response to therapy of the radical abdominal adenopathy, decreased size of hepatic metastases, no new or progressive disease  Cycle 5 Alimta/pembrolizumab 01/01/2019  Cycle 6 Alimta/pembrolizumab 01/23/2019  Cycle 7 Alimta/pembrolizumab 02/12/2019  Cycle 8 Alimta/pembrolizumab 03/05/2019  Cycle 9 Alimta/pembrolizumab 03/26/2019  CTs 04/14/2019-slight interval decrease in size and conspicuity of multiple, irregular low-attenuation liver lesions. No new liver lesions. No recurrent mediastinal or hilar lymphadenopathy. No change in mildly prominent subcentimeter periaortic and peripheral lymph nodes without evidence of recurrent abdominal or  pelvic lymphadenopathy.  Cycle 10 Alimta/pembrolizumab 04/16/2019  Cycle 11 pembrolizumab 05/07/2019 (Alimta held secondary to increased tearing and leg edema)  Cycle 12 Alimta/pembrolizumab 05/28/2019  Cycle 13 Alimta/pembrolizumab 06/18/2019  Restaging CTs 07/08/2019-worsening of low-density hepatic lesions. Scattered small lymph nodes with increasing conspicuity, largest along the left periaortic chain approximately 9 mm previously approximately 5 mm.  Cycle 14 Alimta/pembrolizumab 07/09/2019  Cycle 15 Alimta/pembrolizumab 07/31/2019  Cycle 16 Alimta/pembrolizumab 08/20/2019  Rising CEA beginning December 2020  CTs 09/08/2019-enlargement of liver metastases, mild enlargement of abdominal/retroperitoneal and lower thoracic nodes, nonspecific pulmonary nodules-stable  Cycle 1 gemcitabine 09/17/2019  Cycle 2 gemcitabine 10/01/2019  Cycle 3 gemcitabine 10/15/2019  Cycle 4 gemcitabine 10/29/2019  Cycle 5 gemcitabine 11/12/2019  Rising CEA 10/2019  CTs 11/19/19 mixed response in liver, stable pulmonary nodules, thoracic and RP nodes, overall stable  Plan to intensify chemo carboplatin AUC 4 plus gemcitabine given on day 1 q21 days starting ~12/03/19  Admission 11/30/2019 with recurrent biliary obstruction-ERCP 12/02/2019-obstructed biliary stent, new stent placed  Cycle 1 gemcitabine/carboplatin 12/24/2019  Cycle 2 gemcitabine/carboplatin 01/14/2020  Cycle 3 gemcitabine/carboplatin 02/04/2020  Cycle 4 gemcitabine/carboplatin 02/25/2020  CTs 03/15/2020-decrease in size of hepatic metastatic lesions. No new liver lesions. Newsclerotic lesions in the thoracic and lumbar spine. Stable small pulmonary nodules. Asymmetric wall thickening of the medial wall of the gallbladder new since previous exam without signs of inflammation.  Cycle5gemcitabine/carboplatin 03/17/2020  Cycle 6 gemcitabine/carboplatin 04/15/2020  Cycle 7 gemcitabine/carboplatin 05/09/2020  Cycle 8 gemcitabine/carboplatin  05/31/2020  CTs 06/16/2020-unchanged sclerotic bone lesions, increase in size and number of liver metastases  Cycle 1 docetaxel/ramucirumab 06/27/2020  Cycle 2 docetaxel/ramucirumab 07/18/2020 2. COPD 3. Hypertension 4. Trace edema right leg 12/11/2018. Doppler negative for DVT 12/12/2018. 5. Watery eyes, runny nose reported 12/11/2018. Question related to Alimta. Persistent watery eyes 04/16/2019. Ophthalmology evaluation 05/11/2019-blepharitis,ectropion left lower lid, macular degeneration 6. Admission 12/24/2018 with a fever-no source for infection identified, completed outpatient course of ciprofloxacin 7. Port-A-Cath placement 09/14/2019, interventional radiology 8. Colonoscopy 06/16/2015-diverticulosis in the sigmoid colon, examination otherwise normal    Disposition: Ricky Conway appears stable.  He tolerated the first cycle of docetaxel/ramucirumab well.  He will complete cycle 2 today.  He will return for an office visit and chemotherapy in 3 weeks.  He will receive G-CSF on 07/19/2020.  The plan is to complete 4 cycles of docetaxel/ramucirumab prior to a restaging CT evaluation.  [name], MD  07/18/2020  9:33 AM

## 2020-07-18 NOTE — Patient Instructions (Signed)
[facility]cer Center Discharge Instructions for Patients Receiving Chemotherapy  Today you received the following chemotherapy agents: ramucirumab/docetaxel.  To help prevent nausea and vomiting after your treatment, we encourage you to take your nausea medication as directed.   If you develop nausea and vomiting that is not controlled by your nausea medication, call the clinic.   BELOW ARE SYMPTOMS THAT SHOULD BE REPORTED IMMEDIATELY:  *FEVER GREATER THAN 100.5 F  *CHILLS WITH OR WITHOUT FEVER  NAUSEA AND VOMITING THAT IS NOT CONTROLLED WITH YOUR NAUSEA MEDICATION  *UNUSUAL SHORTNESS OF BREATH  *UNUSUAL BRUISING OR BLEEDING  TENDERNESS IN MOUTH AND THROAT WITH OR WITHOUT PRESENCE OF ULCERS  *URINARY PROBLEMS  *BOWEL PROBLEMS  UNUSUAL RASH Items with * indicate a potential emergency and should be followed up as soon as possible.  Feel free to call the clinic should you have any questions or concerns. The clinic phone number is [phone].  Please show the CHEMO ALERT CARD at check-in to the Emergency Department and triage nurse.   

## 2020-07-19 ENCOUNTER — Ambulatory Visit: Payer: Medicare Other

## 2020-07-19 ENCOUNTER — Other Ambulatory Visit: Payer: Self-pay

## 2020-07-19 ENCOUNTER — Inpatient Hospital Stay: Payer: Medicare Other

## 2020-07-19 ENCOUNTER — Telehealth: Payer: Self-pay | Admitting: Oncology

## 2020-07-19 VITALS — BP 145/81 | HR 85 | Resp 18

## 2020-07-19 DIAGNOSIS — C3490 Malignant neoplasm of unspecified part of unspecified bronchus or lung: Secondary | ICD-10-CM

## 2020-07-19 DIAGNOSIS — Z5111 Encounter for antineoplastic chemotherapy: Secondary | ICD-10-CM | POA: Diagnosis not present

## 2020-07-19 MED ORDER — PEGFILGRASTIM-CBQV 6 MG/0.6ML ~~LOC~~ SOSY
6.0000 mg | PREFILLED_SYRINGE | Freq: Once | SUBCUTANEOUS | Status: AC
  Administered 2020-07-19: 6 mg via SUBCUTANEOUS

## 2020-07-19 MED ORDER — PEGFILGRASTIM-CBQV 6 MG/0.6ML ~~LOC~~ SOSY
PREFILLED_SYRINGE | SUBCUTANEOUS | Status: AC
  Filled 2020-07-19: qty 0.6

## 2020-07-19 NOTE — Telephone Encounter (Signed)
Scheduled appointments per 1/24 los. Will have updated calendar printed for patient at next visit.

## 2020-07-19 NOTE — Patient Instructions (Signed)
Pegfilgrastim injection What is this medicine? PEGFILGRASTIM (PEG fil gra stim) is a long-acting granulocyte colony-stimulating factor that stimulates the growth of neutrophils, a type of white blood cell important in the body's fight against infection. It is used to reduce the incidence of fever and infection in patients with certain types of cancer who are receiving chemotherapy that affects the bone marrow, and to increase survival after being exposed to high doses of radiation. This medicine may be used for other purposes; ask your health care provider or pharmacist if you have questions. COMMON BRAND NAME(S): Fulphila, Neulasta, Nyvepria, UDENYCA, Ziextenzo What should I tell my health care provider before I take this medicine? They need to know if you have any of these conditions:  kidney disease  latex allergy  ongoing radiation therapy  sickle cell disease  skin reactions to acrylic adhesives (On-Body Injector only)  an unusual or allergic reaction to pegfilgrastim, filgrastim, other medicines, foods, dyes, or preservatives  pregnant or trying to get pregnant  breast-feeding How should I use this medicine? This medicine is for injection under the skin. If you get this medicine at home, you will be taught how to prepare and give the pre-filled syringe or how to use the On-body Injector. Refer to the patient Instructions for Use for detailed instructions. Use exactly as directed. Tell your healthcare provider immediately if you suspect that the On-body Injector may not have performed as intended or if you suspect the use of the On-body Injector resulted in a missed or partial dose. It is important that you put your used needles and syringes in a special sharps container. Do not put them in a trash can. If you do not have a sharps container, call your pharmacist or healthcare provider to get one. Talk to your pediatrician regarding the use of this medicine in children. While this drug  may be prescribed for selected conditions, precautions do apply. Overdosage: If you think you have taken too much of this medicine contact a poison control center or emergency room at once. NOTE: This medicine is only for you. Do not share this medicine with others. What if I miss a dose? It is important not to miss your dose. Call your doctor or health care professional if you miss your dose. If you miss a dose due to an On-body Injector failure or leakage, a new dose should be administered as soon as possible using a single prefilled syringe for manual use. What may interact with this medicine? Interactions have not been studied. This list may not describe all possible interactions. Give your health care provider a list of all the medicines, herbs, non-prescription drugs, or dietary supplements you use. Also tell them if you smoke, drink alcohol, or use illegal drugs. Some items may interact with your medicine. What should I watch for while using this medicine? Your condition will be monitored carefully while you are receiving this medicine. You may need blood work done while you are taking this medicine. Talk to your health care provider about your risk of cancer. You may be more at risk for certain types of cancer if you take this medicine. If you are going to need a MRI, CT scan, or other procedure, tell your doctor that you are using this medicine (On-Body Injector only). What side effects may I notice from receiving this medicine? Side effects that you should report to your doctor or health care professional as soon as possible:  allergic reactions (skin rash, itching or hives, swelling of   the face, lips, or tongue)  back pain  dizziness  fever  pain, redness, or irritation at site where injected  pinpoint red spots on the skin  red or dark-brown urine  shortness of breath or breathing problems  stomach or side pain, or pain at the shoulder  swelling  tiredness  trouble  passing urine or change in the amount of urine  unusual bruising or bleeding Side effects that usually do not require medical attention (report to your doctor or health care professional if they continue or are bothersome):  bone pain  muscle pain This list may not describe all possible side effects. Call your doctor for medical advice about side effects. You may report side effects to FDA at 1-800-FDA-1088. Where should I keep my medicine? Keep out of the reach of children. If you are using this medicine at home, you will be instructed on how to store it. Throw away any unused medicine after the expiration date on the label. NOTE: This sheet is a summary. It may not cover all possible information. If you have questions about this medicine, talk to your doctor, pharmacist, or health care provider.  2021 Elsevier/Gold Standard (2019-07-03 13:20:51)  

## 2020-07-22 ENCOUNTER — Telehealth: Payer: Self-pay

## 2020-07-22 NOTE — Telephone Encounter (Signed)
Per Ned Card NP contacted wife and let he know she suggested to have pt get a Covid test. Wife agreed, stated she would make an appointment at [facility] today . Told wife if pt positive to let us know.

## 2020-07-22 NOTE — Telephone Encounter (Signed)
Pt called in to report he had just taken a Covid test r/t to earlier reported symptoms. Covid test was negative.

## 2020-07-22 NOTE — Telephone Encounter (Signed)
Called pt to follow up on his call to answering service last PM. Pt told answering service he had a low grade temp last night. Pt stated he has felt bad since his 07/19/20 treatment but felt worse last pm. Wife stated when contacted this am ,she had given tylenol last night after the after hours call and his temp of 100.3 went down. This am pt has been up and about, eaten well and has no fever . Wife encouraged to have pt take tylenol if he becomes  febrile and to let us know if he gets worse. Wife verbalized understanding.

## 2020-08-07 ENCOUNTER — Other Ambulatory Visit: Payer: Self-pay | Admitting: Oncology

## 2020-08-08 ENCOUNTER — Inpatient Hospital Stay: Payer: Medicare Other

## 2020-08-08 ENCOUNTER — Inpatient Hospital Stay (HOSPITAL_BASED_OUTPATIENT_CLINIC_OR_DEPARTMENT_OTHER): Payer: Medicare Other | Admitting: Oncology

## 2020-08-08 ENCOUNTER — Inpatient Hospital Stay: Payer: Medicare Other | Attending: Oncology

## 2020-08-08 ENCOUNTER — Other Ambulatory Visit: Payer: Self-pay

## 2020-08-08 VITALS — BP 139/70 | HR 83 | Temp 98.1°F | Resp 16 | Ht 71.0 in | Wt 184.8 lb

## 2020-08-08 DIAGNOSIS — Z5111 Encounter for antineoplastic chemotherapy: Secondary | ICD-10-CM | POA: Insufficient documentation

## 2020-08-08 DIAGNOSIS — Z5189 Encounter for other specified aftercare: Secondary | ICD-10-CM | POA: Insufficient documentation

## 2020-08-08 DIAGNOSIS — Z79899 Other long term (current) drug therapy: Secondary | ICD-10-CM | POA: Insufficient documentation

## 2020-08-08 DIAGNOSIS — Z5112 Encounter for antineoplastic immunotherapy: Secondary | ICD-10-CM | POA: Diagnosis present

## 2020-08-08 DIAGNOSIS — C3431 Malignant neoplasm of lower lobe, right bronchus or lung: Secondary | ICD-10-CM | POA: Diagnosis not present

## 2020-08-08 DIAGNOSIS — C787 Secondary malignant neoplasm of liver and intrahepatic bile duct: Secondary | ICD-10-CM | POA: Diagnosis present

## 2020-08-08 DIAGNOSIS — C3490 Malignant neoplasm of unspecified part of unspecified bronchus or lung: Secondary | ICD-10-CM

## 2020-08-08 LAB — CBC WITH DIFFERENTIAL (CANCER CENTER ONLY)
Abs Immature Granulocytes: 0.05 10*3/uL (ref 0.00–0.07)
Basophils Absolute: 0.1 10*3/uL (ref 0.0–0.1)
Basophils Relative: 1 %
Eosinophils Absolute: 0 10*3/uL (ref 0.0–0.5)
Eosinophils Relative: 0 %
HCT: 43.9 % (ref 39.0–52.0)
Hemoglobin: 14.9 g/dL (ref 13.0–17.0)
Immature Granulocytes: 0 %
Lymphocytes Relative: 9 %
Lymphs Abs: 1.4 10*3/uL (ref 0.7–4.0)
MCH: 33.2 pg (ref 26.0–34.0)
MCHC: 33.9 g/dL (ref 30.0–36.0)
MCV: 97.8 fL (ref 80.0–100.0)
Monocytes Absolute: 1.4 10*3/uL — ABNORMAL HIGH (ref 0.1–1.0)
Monocytes Relative: 9 %
Neutro Abs: 13 10*3/uL — ABNORMAL HIGH (ref 1.7–7.7)
Neutrophils Relative %: 81 %
Platelet Count: 219 10*3/uL (ref 150–400)
RBC: 4.49 MIL/uL (ref 4.22–5.81)
RDW: 17.7 % — ABNORMAL HIGH (ref 11.5–15.5)
WBC Count: 16 10*3/uL — ABNORMAL HIGH (ref 4.0–10.5)
nRBC: 0 % (ref 0.0–0.2)

## 2020-08-08 LAB — CMP (CANCER CENTER ONLY)
ALT: 16 U/L (ref 0–44)
AST: 23 U/L (ref 15–41)
Albumin: 3.2 g/dL — ABNORMAL LOW (ref 3.5–5.0)
Alkaline Phosphatase: 107 U/L (ref 38–126)
Anion gap: 8 (ref 5–15)
BUN: 11 mg/dL (ref 8–23)
CO2: 28 mmol/L (ref 22–32)
Calcium: 9.1 mg/dL (ref 8.9–10.3)
Chloride: 98 mmol/L (ref 98–111)
Creatinine: 0.64 mg/dL (ref 0.61–1.24)
GFR, Estimated: >60 mL/min (ref >60)
Glucose, Bld: 105 mg/dL — ABNORMAL HIGH (ref 70–99)
Potassium: 4 mmol/L (ref 3.5–5.1)
Sodium: 134 mmol/L — ABNORMAL LOW (ref 135–145)
Total Bilirubin: 0.6 mg/dL (ref 0.3–1.2)
Total Protein: 6.8 g/dL (ref 6.5–8.1)

## 2020-08-08 LAB — TOTAL PROTEIN, URINE DIPSTICK: Protein, ur: <30 mg/dL — AB

## 2020-08-08 LAB — CEA (IN HOUSE-CHCC): CEA (CHCC-In House): 4788.54 ng/mL — ABNORMAL HIGH (ref 0.00–5.00)

## 2020-08-08 MED ORDER — DIPHENHYDRAMINE HCL 50 MG/ML IJ SOLN
INTRAMUSCULAR | Status: AC
  Filled 2020-08-08: qty 1

## 2020-08-08 MED ORDER — SODIUM CHLORIDE 0.9 % IV SOLN
Freq: Once | INTRAVENOUS | Status: AC
  Filled 2020-08-08: qty 250

## 2020-08-08 MED ORDER — SODIUM CHLORIDE 0.9 % IV SOLN
10.0000 mg | Freq: Once | INTRAVENOUS | Status: AC
  Administered 2020-08-08: 10 mg via INTRAVENOUS
  Filled 2020-08-08: qty 10

## 2020-08-08 MED ORDER — RAMUCIRUMAB CHEMO INJECTION 100 MG/10ML
10.0000 mg/kg | Freq: Once | INTRAVENOUS | Status: AC
  Administered 2020-08-08: 800 mg via INTRAVENOUS
  Filled 2020-08-08: qty 50

## 2020-08-08 MED ORDER — SODIUM CHLORIDE 0.9% FLUSH
10.0000 mL | INTRAVENOUS | Status: DC | PRN
  Administered 2020-08-08: 10 mL
  Filled 2020-08-08: qty 10

## 2020-08-08 MED ORDER — HEPARIN SOD (PORK) LOCK FLUSH 100 UNIT/ML IV SOLN
500.0000 [IU] | Freq: Once | INTRAVENOUS | Status: AC | PRN
  Administered 2020-08-08: 500 [IU]
  Filled 2020-08-08: qty 5

## 2020-08-08 MED ORDER — SODIUM CHLORIDE 0.9 % IV SOLN
60.0000 mg/m2 | Freq: Once | INTRAVENOUS | Status: AC
  Administered 2020-08-08: 120 mg via INTRAVENOUS
  Filled 2020-08-08: qty 12

## 2020-08-08 MED ORDER — DIPHENHYDRAMINE HCL 50 MG/ML IJ SOLN
25.0000 mg | Freq: Once | INTRAMUSCULAR | Status: AC
  Administered 2020-08-08: 25 mg via INTRAVENOUS

## 2020-08-08 MED ORDER — ACETAMINOPHEN 325 MG PO TABS
650.0000 mg | ORAL_TABLET | Freq: Once | ORAL | Status: AC
  Administered 2020-08-08: 650 mg via ORAL

## 2020-08-08 MED ORDER — ACETAMINOPHEN 325 MG PO TABS
ORAL_TABLET | ORAL | Status: AC
  Filled 2020-08-08: qty 2

## 2020-08-08 NOTE — Progress Notes (Signed)
Per Dr.Sherrill, advised patient that their CEA had improved.  Patient understands.  Gardiner Rhyme, RN

## 2020-08-08 NOTE — Patient Instructions (Signed)
[facility] Discharge Instructions for Patients Receiving Chemotherapy  Today you received the following chemotherapy agents: Ramucirumab (Cyramza) and Docetaxel   To help prevent nausea and vomiting after your treatment, we encourage you to take your nausea medication  as prescribed.    If you develop nausea and vomiting that is not controlled by your nausea medication, call the clinic.   BELOW ARE SYMPTOMS THAT SHOULD BE REPORTED IMMEDIATELY:  *FEVER GREATER THAN 100.5 F  *CHILLS WITH OR WITHOUT FEVER  NAUSEA AND VOMITING THAT IS NOT CONTROLLED WITH YOUR NAUSEA MEDICATION  *UNUSUAL SHORTNESS OF BREATH  *UNUSUAL BRUISING OR BLEEDING  TENDERNESS IN MOUTH AND THROAT WITH OR WITHOUT PRESENCE OF ULCERS  *URINARY PROBLEMS  *BOWEL PROBLEMS  UNUSUAL RASH Items with * indicate a potential emergency and should be followed up as soon as possible.  Feel free to call the clinic should you have any questions or concerns. The clinic phone number is [phone].  Please show the CHEMO ALERT CARD at check-in to the Emergency Department and triage nurse.   

## 2020-08-08 NOTE — Progress Notes (Signed)
[facility] OFFICE PROGRESS NOTE   Diagnosis: Non-small cell lung cancer  INTERVAL HISTORY:   Ricky Conway completed another cycle of docetaxel/ramucirumab on 07/18/2020.  No nausea/vomiting, mouth sores, or diarrhea.  He has chronic pain at the right shoulder.  No other pain.  No neuropathy symptoms.  Stable leg swelling.  Objective:  Vital signs in last 24 hours:  Blood pressure 139/70, pulse 83, temperature 98.1 F (36.7 C), temperature source Tympanic, resp. rate 16, height '5\' 11"'  (1.803 m), weight 184 lb 12.8 oz (83.8 kg), SpO2 90 %.    HEENT: No thrush or ulcers Resp: Distant breath sounds, no respiratory distress Cardio: Regular rate and rhythm GI: Fullness in the right subcostal region without a discrete liver edge, nontender Vascular: Trace pitting edema at the right greater than left lower leg  Skin: Dryness at the lower back and hands  Portacath/PICC-without erythema  Lab Results:  Lab Results  Component Value Date   WBC 16.0 (H) 08/08/2020   HGB 14.9 08/08/2020   HCT 43.9 08/08/2020   MCV 97.8 08/08/2020   PLT 219 08/08/2020   NEUTROABS 13.0 (H) 08/08/2020    CMP  Lab Results  Component Value Date   NA 134 (L) 08/08/2020   K 4.0 08/08/2020   CL 98 08/08/2020   CO2 28 08/08/2020   GLUCOSE 105 (H) 08/08/2020   BUN 11 08/08/2020   CREATININE 0.64 08/08/2020   CALCIUM 9.1 08/08/2020   PROT 6.8 08/08/2020   ALBUMIN 3.2 (L) 08/08/2020   AST 23 08/08/2020   ALT 16 08/08/2020   ALKPHOS 107 08/08/2020   BILITOT 0.6 08/08/2020   GFRNONAA >60 08/08/2020   GFRAA >60 03/15/2020    Lab Results  Component Value Date   CEA1 5,852.77 (H) 06/27/2020     Medications: I have reviewed the patient's current medications.   Assessment/Plan: 1. Pancreas mass, liver lesions  Abdominal ultrasound 09/03/2018-3 liver lesions. Intra-and extrahepatic biliary ductal dilatation.  Presentation to the emergency department 09/08/2018 with painless  jaundice. Initial bilirubin returned at 18.3.   MRI abdomen 09/08/2018-1.9 x 2.6 cm hypoenhancing lesion arising exophytically along the posterior aspect of the pancreatic head suspicious for pancreatic adenocarcinoma. There was suspected involvement of the distal common duct with secondary intrahepatic/extrahepatic ductal dilatation. Lesion noted to abut the IVC. Multiple liver metastases noted. Small upper abdominal/retroperitoneal lymph nodes noted.   ERCP 09/09/2018-high-grade stricture and obstruction of the distal common bile duct. A metallic biliary stent was placed. Brushings in the mid and lower third of the main bile duct were obtained with no malignant cells identified.   Bilirubin improved at 4.7 on 09/10/2018.   Biopsy of a liver lesion on 09/11/2018-metastatic carcinoma, cytokeratin 7 and TTF-1 positive, negative for cytokeratin 20, CDX 2, and PSA. Foundation 1-microsatellite stable, tumor mutation burden 16, ERBB2 amplification  CT chest 09/22/2018-right hilar/mediastinal/retrocrural lymphadenopathy, ill-defined right lower lobe nodularity, emphysema  Cycle 1 carboplatin/Alimta/pembrolizumab 10/09/2018  Cycle 2 carboplatin/Alimta/pembrolizumab 10/30/2018  Cycle 3 carboplatin/Alimta/pembrolizumab 11/20/2018  Cycle 4 carboplatin/Alimta/pembrolizumab 12/11/2018  CT 12/26/2018-marked response to therapy of the radical abdominal adenopathy, decreased size of hepatic metastases, no new or progressive disease  Cycle 5 Alimta/pembrolizumab 01/01/2019  Cycle 6 Alimta/pembrolizumab 01/23/2019  Cycle 7 Alimta/pembrolizumab 02/12/2019  Cycle 8 Alimta/pembrolizumab 03/05/2019  Cycle 9 Alimta/pembrolizumab 03/26/2019  CTs 04/14/2019-slight interval decrease in size and conspicuity of multiple, irregular low-attenuation liver lesions. No new liver lesions. No recurrent mediastinal or hilar lymphadenopathy. No change in mildly prominent subcentimeter periaortic and peripheral lymph  nodes without  evidence of recurrent abdominal or pelvic lymphadenopathy.  Cycle 10 Alimta/pembrolizumab 04/16/2019  Cycle 11 pembrolizumab 05/07/2019 (Alimta held secondary to increased tearing and leg edema)  Cycle 12 Alimta/pembrolizumab 05/28/2019  Cycle 13 Alimta/pembrolizumab 06/18/2019  Restaging CTs 07/08/2019-worsening of low-density hepatic lesions. Scattered small lymph nodes with increasing conspicuity, largest along the left periaortic chain approximately 9 mm previously approximately 5 mm.  Cycle 14 Alimta/pembrolizumab 07/09/2019  Cycle 15 Alimta/pembrolizumab 07/31/2019  Cycle 16 Alimta/pembrolizumab 08/20/2019  Rising CEA beginning December 2020  CTs 09/08/2019-enlargement of liver metastases, mild enlargement of abdominal/retroperitoneal and lower thoracic nodes, nonspecific pulmonary nodules-stable  Cycle 1 gemcitabine 09/17/2019  Cycle 2 gemcitabine 10/01/2019  Cycle 3 gemcitabine 10/15/2019  Cycle 4 gemcitabine 10/29/2019  Cycle 5 gemcitabine 11/12/2019  Rising CEA 10/2019  CTs 11/19/19 mixed response in liver, stable pulmonary nodules, thoracic and RP nodes, overall stable  Plan to intensify chemo carboplatin AUC 4 plus gemcitabine given on day 1 q21 days starting ~12/03/19  Admission 11/30/2019 with recurrent biliary obstruction-ERCP 12/02/2019-obstructed biliary stent, new stent placed  Cycle 1 gemcitabine/carboplatin 12/24/2019  Cycle 2 gemcitabine/carboplatin 01/14/2020  Cycle 3 gemcitabine/carboplatin 02/04/2020  Cycle 4 gemcitabine/carboplatin 02/25/2020  CTs 03/15/2020-decrease in size of hepatic metastatic lesions. No new liver lesions. Newsclerotic lesions in the thoracic and lumbar spine. Stable small pulmonary nodules. Asymmetric wall thickening of the medial wall of the gallbladder new since previous exam without signs of inflammation.  Cycle5gemcitabine/carboplatin 03/17/2020  Cycle 6 gemcitabine/carboplatin 04/15/2020  Cycle 7 gemcitabine/carboplatin  05/09/2020  Cycle 8 gemcitabine/carboplatin 05/31/2020  CTs 06/16/2020-unchanged sclerotic bone lesions, increase in size and number of liver metastases  Cycle 1 docetaxel/ramucirumab 06/27/2020  Cycle 2 docetaxel/ramucirumab 07/18/2020  Cycle 3 docetaxel/ramucirumab 08/08/2020 2. COPD 3. Hypertension 4. Trace edema right leg 12/11/2018. Doppler negative for DVT 12/12/2018. 5. Watery eyes, runny nose reported 12/11/2018. Question related to Alimta. Persistent watery eyes 04/16/2019. Ophthalmology evaluation 05/11/2019-blepharitis,ectropion left lower lid, macular degeneration 6. Admission 12/24/2018 with a fever-no source for infection identified, completed outpatient course of ciprofloxacin 7. Port-A-Cath placement 09/14/2019, interventional radiology 8. Colonoscopy 06/16/2015-diverticulosis in the sigmoid colon, examination otherwise normal     Disposition: Ricky Conway appears stable.  He is tolerating the docetaxel and ramucirumab well.  He will complete another cycle today.  He will undergo a restaging CT after cycle 4.  Ricky Conway will return for an office visit and chemotherapy in 3 weeks.  [name], MD  08/08/2020  11:31 AM

## 2020-08-09 ENCOUNTER — Inpatient Hospital Stay: Payer: Medicare Other

## 2020-08-09 ENCOUNTER — Telehealth: Payer: Self-pay | Admitting: Oncology

## 2020-08-09 ENCOUNTER — Other Ambulatory Visit: Payer: Self-pay

## 2020-08-09 VITALS — BP 159/81 | HR 80 | Resp 18

## 2020-08-09 DIAGNOSIS — C3490 Malignant neoplasm of unspecified part of unspecified bronchus or lung: Secondary | ICD-10-CM

## 2020-08-09 DIAGNOSIS — Z5111 Encounter for antineoplastic chemotherapy: Secondary | ICD-10-CM | POA: Diagnosis not present

## 2020-08-09 MED ORDER — PEGFILGRASTIM-CBQV 6 MG/0.6ML ~~LOC~~ SOSY
PREFILLED_SYRINGE | SUBCUTANEOUS | Status: AC
  Filled 2020-08-09: qty 0.6

## 2020-08-09 MED ORDER — PEGFILGRASTIM-CBQV 6 MG/0.6ML ~~LOC~~ SOSY
6.0000 mg | PREFILLED_SYRINGE | Freq: Once | SUBCUTANEOUS | Status: AC
  Administered 2020-08-09: 6 mg via SUBCUTANEOUS

## 2020-08-09 NOTE — Telephone Encounter (Signed)
Scheduled appointments per 2/14 los. Spoke to patient's wife who is aware of appointments dates and times.

## 2020-08-28 ENCOUNTER — Other Ambulatory Visit: Payer: Self-pay | Admitting: Oncology

## 2020-08-29 ENCOUNTER — Inpatient Hospital Stay: Payer: Medicare Other

## 2020-08-29 ENCOUNTER — Inpatient Hospital Stay (HOSPITAL_BASED_OUTPATIENT_CLINIC_OR_DEPARTMENT_OTHER): Payer: Medicare Other | Admitting: Nurse Practitioner

## 2020-08-29 ENCOUNTER — Encounter: Payer: Self-pay | Admitting: Nurse Practitioner

## 2020-08-29 ENCOUNTER — Other Ambulatory Visit: Payer: Self-pay

## 2020-08-29 ENCOUNTER — Inpatient Hospital Stay: Payer: Medicare Other | Attending: Oncology

## 2020-08-29 VITALS — BP 153/73 | HR 87 | Temp 98.3°F | Resp 18 | Ht 71.0 in | Wt 183.3 lb

## 2020-08-29 DIAGNOSIS — C3431 Malignant neoplasm of lower lobe, right bronchus or lung: Secondary | ICD-10-CM | POA: Diagnosis not present

## 2020-08-29 DIAGNOSIS — Z5189 Encounter for other specified aftercare: Secondary | ICD-10-CM | POA: Insufficient documentation

## 2020-08-29 DIAGNOSIS — Z5112 Encounter for antineoplastic immunotherapy: Secondary | ICD-10-CM | POA: Insufficient documentation

## 2020-08-29 DIAGNOSIS — C787 Secondary malignant neoplasm of liver and intrahepatic bile duct: Secondary | ICD-10-CM | POA: Diagnosis present

## 2020-08-29 DIAGNOSIS — Z5111 Encounter for antineoplastic chemotherapy: Secondary | ICD-10-CM | POA: Insufficient documentation

## 2020-08-29 DIAGNOSIS — C3490 Malignant neoplasm of unspecified part of unspecified bronchus or lung: Secondary | ICD-10-CM

## 2020-08-29 DIAGNOSIS — Z79899 Other long term (current) drug therapy: Secondary | ICD-10-CM | POA: Insufficient documentation

## 2020-08-29 DIAGNOSIS — Z95828 Presence of other vascular implants and grafts: Secondary | ICD-10-CM

## 2020-08-29 LAB — CMP (CANCER CENTER ONLY)
ALT: 18 U/L (ref 0–44)
AST: 24 U/L (ref 15–41)
Albumin: 3.1 g/dL — ABNORMAL LOW (ref 3.5–5.0)
Alkaline Phosphatase: 100 U/L (ref 38–126)
Anion gap: 7 (ref 5–15)
BUN: 9 mg/dL (ref 8–23)
CO2: 29 mmol/L (ref 22–32)
Calcium: 8.7 mg/dL — ABNORMAL LOW (ref 8.9–10.3)
Chloride: 100 mmol/L (ref 98–111)
Creatinine: 0.63 mg/dL (ref 0.61–1.24)
GFR, Estimated: >60 mL/min (ref >60)
Glucose, Bld: 103 mg/dL — ABNORMAL HIGH (ref 70–99)
Potassium: 4 mmol/L (ref 3.5–5.1)
Sodium: 136 mmol/L (ref 135–145)
Total Bilirubin: 0.5 mg/dL (ref 0.3–1.2)
Total Protein: 6.6 g/dL (ref 6.5–8.1)

## 2020-08-29 LAB — CBC WITH DIFFERENTIAL (CANCER CENTER ONLY)
Abs Immature Granulocytes: 0.06 10*3/uL (ref 0.00–0.07)
Basophils Absolute: 0.1 10*3/uL (ref 0.0–0.1)
Basophils Relative: 1 %
Eosinophils Absolute: 0 10*3/uL (ref 0.0–0.5)
Eosinophils Relative: 0 %
HCT: 44.7 % (ref 39.0–52.0)
Hemoglobin: 14.8 g/dL (ref 13.0–17.0)
Immature Granulocytes: 1 %
Lymphocytes Relative: 14 %
Lymphs Abs: 1.7 10*3/uL (ref 0.7–4.0)
MCH: 32.5 pg (ref 26.0–34.0)
MCHC: 33.1 g/dL (ref 30.0–36.0)
MCV: 98 fL (ref 80.0–100.0)
Monocytes Absolute: 1.2 10*3/uL — ABNORMAL HIGH (ref 0.1–1.0)
Monocytes Relative: 10 %
Neutro Abs: 9.6 10*3/uL — ABNORMAL HIGH (ref 1.7–7.7)
Neutrophils Relative %: 76 %
Platelet Count: 224 10*3/uL (ref 150–400)
RBC: 4.56 MIL/uL (ref 4.22–5.81)
RDW: 17.4 % — ABNORMAL HIGH (ref 11.5–15.5)
WBC Count: 12.7 10*3/uL — ABNORMAL HIGH (ref 4.0–10.5)
nRBC: 0 % (ref 0.0–0.2)

## 2020-08-29 LAB — CEA (IN HOUSE-CHCC): CEA (CHCC-In House): 3914.24 ng/mL — ABNORMAL HIGH (ref 0.00–5.00)

## 2020-08-29 LAB — TOTAL PROTEIN, URINE DIPSTICK: Protein, ur: NEGATIVE mg/dL

## 2020-08-29 MED ORDER — SODIUM CHLORIDE 0.9 % IV SOLN
60.0000 mg/m2 | Freq: Once | INTRAVENOUS | Status: AC
  Administered 2020-08-29: 120 mg via INTRAVENOUS
  Filled 2020-08-29: qty 12

## 2020-08-29 MED ORDER — ACETAMINOPHEN 325 MG PO TABS
ORAL_TABLET | ORAL | Status: AC
  Filled 2020-08-29: qty 2

## 2020-08-29 MED ORDER — DIPHENHYDRAMINE HCL 50 MG/ML IJ SOLN
25.0000 mg | Freq: Once | INTRAMUSCULAR | Status: AC
  Administered 2020-08-29: 25 mg via INTRAVENOUS

## 2020-08-29 MED ORDER — DIPHENHYDRAMINE HCL 25 MG PO CAPS
ORAL_CAPSULE | ORAL | Status: AC
  Filled 2020-08-29: qty 2

## 2020-08-29 MED ORDER — SODIUM CHLORIDE 0.9 % IV SOLN
10.0000 mg/kg | Freq: Once | INTRAVENOUS | Status: AC
  Administered 2020-08-29: 800 mg via INTRAVENOUS
  Filled 2020-08-29: qty 50

## 2020-08-29 MED ORDER — SODIUM CHLORIDE 0.9 % IV SOLN
10.0000 mg | Freq: Once | INTRAVENOUS | Status: AC
  Administered 2020-08-29: 10 mg via INTRAVENOUS
  Filled 2020-08-29: qty 10

## 2020-08-29 MED ORDER — SODIUM CHLORIDE 0.9 % IV SOLN
Freq: Once | INTRAVENOUS | Status: AC
  Filled 2020-08-29: qty 250

## 2020-08-29 MED ORDER — SODIUM CHLORIDE 0.9% FLUSH
10.0000 mL | INTRAVENOUS | Status: DC | PRN
  Filled 2020-08-29: qty 10

## 2020-08-29 MED ORDER — ACETAMINOPHEN 325 MG PO TABS
650.0000 mg | ORAL_TABLET | Freq: Once | ORAL | Status: AC
  Administered 2020-08-29: 650 mg via ORAL

## 2020-08-29 NOTE — Progress Notes (Signed)
[place] [facility] OFFICE PROGRESS NOTE   Diagnosis: Non-small cell lung cancer  INTERVAL HISTORY:   Ricky Conway returns as scheduled.  He completed cycle 3 docetaxel/ramucirumab 08/08/2020.  He reports a poor energy level for about a week after each treatment.  No nausea or vomiting.  No mouth sores.  No diarrhea or constipation.  No numbness or tingling in the hands or feet.  Objective:  Vital signs in last 24 hours:  Blood pressure (!) 153/73, pulse 87, temperature 98.3 F (36.8 C), temperature source Tympanic, resp. rate 18, height '5\' 11"'  (1.803 m), weight 183 lb 4.8 oz (83.1 kg), SpO2 90 %.    HEENT: No thrush or ulcers. Resp: Distant breath sounds.  No respiratory distress. Cardio: Regular rate and rhythm. GI: Abdomen soft and nontender.  Fullness in the right upper abdomen, no hepatomegaly. Vascular: Trace pitting edema at the lower leg bilaterally. Neuro: Alert and oriented. Skin: No rash. Port-A-Cath without erythema.   Lab Results:  Lab Results  Component Value Date   WBC 16.0 (H) 08/08/2020   HGB 14.9 08/08/2020   HCT 43.9 08/08/2020   MCV 97.8 08/08/2020   PLT 219 08/08/2020   NEUTROABS 13.0 (H) 08/08/2020    Imaging:  No results found.  Medications: I have reviewed the patient's current medications.  Assessment/Plan: 1. Pancreas mass, liver lesions  Abdominal ultrasound 09/03/2018-3 liver lesions. Intra-and extrahepatic biliary ductal dilatation.  Presentation to the emergency department 09/08/2018 with painless jaundice. Initial bilirubin returned at 18.3.   MRI abdomen 09/08/2018-1.9 x 2.6 cm hypoenhancing lesion arising exophytically along the posterior aspect of the pancreatic head suspicious for pancreatic adenocarcinoma. There was suspected involvement of the distal common duct with secondary intrahepatic/extrahepatic ductal dilatation. Lesion noted to abut the IVC. Multiple liver metastases noted. Small upper  abdominal/retroperitoneal lymph nodes noted.   ERCP 09/09/2018-high-grade stricture and obstruction of the distal common bile duct. A metallic biliary stent was placed. Brushings in the mid and lower third of the main bile duct were obtained with no malignant cells identified.   Bilirubin improved at 4.7 on 09/10/2018.   Biopsy of a liver lesion on 09/11/2018-metastatic carcinoma, cytokeratin 7 and TTF-1 positive, negative for cytokeratin 20, CDX 2, and PSA. Foundation 1-microsatellite stable, tumor mutation burden 16, ERBB2 amplification  CT chest 09/22/2018-right hilar/mediastinal/retrocrural lymphadenopathy, ill-defined right lower lobe nodularity, emphysema  Cycle 1 carboplatin/Alimta/pembrolizumab 10/09/2018  Cycle 2 carboplatin/Alimta/pembrolizumab 10/30/2018  Cycle 3 carboplatin/Alimta/pembrolizumab 11/20/2018  Cycle 4 carboplatin/Alimta/pembrolizumab 12/11/2018  CT 12/26/2018-marked response to therapy of the radical abdominal adenopathy, decreased size of hepatic metastases, no new or progressive disease  Cycle 5 Alimta/pembrolizumab 01/01/2019  Cycle 6 Alimta/pembrolizumab 01/23/2019  Cycle 7 Alimta/pembrolizumab 02/12/2019  Cycle 8 Alimta/pembrolizumab 03/05/2019  Cycle 9 Alimta/pembrolizumab 03/26/2019  CTs 04/14/2019-slight interval decrease in size and conspicuity of multiple, irregular low-attenuation liver lesions. No new liver lesions. No recurrent mediastinal or hilar lymphadenopathy. No change in mildly prominent subcentimeter periaortic and peripheral lymph nodes without evidence of recurrent abdominal or pelvic lymphadenopathy.  Cycle 10 Alimta/pembrolizumab 04/16/2019  Cycle 11 pembrolizumab 05/07/2019 (Alimta held secondary to increased tearing and leg edema)  Cycle 12 Alimta/pembrolizumab 05/28/2019  Cycle 13 Alimta/pembrolizumab 06/18/2019  Restaging CTs 07/08/2019-worsening of low-density hepatic lesions. Scattered small lymph nodes with increasing  conspicuity, largest along the left periaortic chain approximately 9 mm previously approximately 5 mm.  Cycle 14 Alimta/pembrolizumab 07/09/2019  Cycle 15 Alimta/pembrolizumab 07/31/2019  Cycle 16 Alimta/pembrolizumab 08/20/2019  Rising CEA beginning December 2020  CTs 09/08/2019-enlargement of liver metastases, mild enlargement  of abdominal/retroperitoneal and lower thoracic nodes, nonspecific pulmonary nodules-stable  Cycle 1 gemcitabine 09/17/2019  Cycle 2 gemcitabine 10/01/2019  Cycle 3 gemcitabine 10/15/2019  Cycle 4 gemcitabine 10/29/2019  Cycle 5 gemcitabine 11/12/2019  Rising CEA 10/2019  CTs 11/19/19 mixed response in liver, stable pulmonary nodules, thoracic and RP nodes, overall stable  Plan to intensify chemo carboplatin AUC 4 plus gemcitabine given on day 1 q21 days starting ~12/03/19  Admission 11/30/2019 with recurrent biliary obstruction-ERCP 12/02/2019-obstructed biliary stent, new stent placed  Cycle 1 gemcitabine/carboplatin 12/24/2019  Cycle 2 gemcitabine/carboplatin 01/14/2020  Cycle 3 gemcitabine/carboplatin 02/04/2020  Cycle 4 gemcitabine/carboplatin 02/25/2020  CTs 03/15/2020-decrease in size of hepatic metastatic lesions. No new liver lesions. Newsclerotic lesions in the thoracic and lumbar spine. Stable small pulmonary nodules. Asymmetric wall thickening of the medial wall of the gallbladder new since previous exam without signs of inflammation.  Cycle5gemcitabine/carboplatin 03/17/2020  Cycle 6 gemcitabine/carboplatin 04/15/2020  Cycle 7 gemcitabine/carboplatin 05/09/2020  Cycle 8 gemcitabine/carboplatin 05/31/2020  CTs 06/16/2020-unchanged sclerotic bone lesions, increase in size and number of liver metastases  Cycle 1 docetaxel/ramucirumab 06/27/2020  Cycle 2 docetaxel/ramucirumab 07/18/2020  Cycle 3 docetaxel/ramucirumab 08/08/2020  Cycle 4 docetaxel/ramucirumab 08/29/2020 2. COPD 3. Hypertension 4. Trace edema right leg 12/11/2018. Doppler negative for DVT  12/12/2018. 5. Watery eyes, runny nose reported 12/11/2018. Question related to Alimta. Persistent watery eyes 04/16/2019. Ophthalmology evaluation 05/11/2019-blepharitis,ectropion left lower lid, macular degeneration 6. Admission 12/24/2018 with a fever-no source for infection identified, completed outpatient course of ciprofloxacin 7. Port-A-Cath placement 09/14/2019, interventional radiology 8. Colonoscopy 06/16/2015-diverticulosis in the sigmoid colon, examination otherwise normal   Disposition: Ricky Conway appears unchanged.  He has completed 3 cycles of docetaxel/ramucirumab.  He continues to tolerate treatment well.  Plan to proceed with cycle 4 today as scheduled.  Restaging CTs prior to next office visit.  We reviewed the CBC from today.  Counts adequate to proceed with treatment.  He will return for lab, follow-up, docetaxel/ramucirumab in 3 weeks with CT scans a few days prior.  He will contact the office in the interim with any problems.    Ned Card ANP/GNP-BC   08/29/2020  10:41 AM

## 2020-08-29 NOTE — Progress Notes (Signed)
Pt discharged in no apparent distress. Pt left ambulatory without assistance. Pt aware of discharge instructions and verbalized understanding and had no further questions.  

## 2020-08-29 NOTE — Patient Instructions (Signed)
[facility] Discharge Instructions for Patients Receiving Chemotherapy  Today you received the following chemotherapy agents: Ramucirumab (Cyramza) and Docetaxel   To help prevent nausea and vomiting after your treatment, we encourage you to take your nausea medication  as prescribed.    If you develop nausea and vomiting that is not controlled by your nausea medication, call the clinic.   BELOW ARE SYMPTOMS THAT SHOULD BE REPORTED IMMEDIATELY:  *FEVER GREATER THAN 100.5 F  *CHILLS WITH OR WITHOUT FEVER  NAUSEA AND VOMITING THAT IS NOT CONTROLLED WITH YOUR NAUSEA MEDICATION  *UNUSUAL SHORTNESS OF BREATH  *UNUSUAL BRUISING OR BLEEDING  TENDERNESS IN MOUTH AND THROAT WITH OR WITHOUT PRESENCE OF ULCERS  *URINARY PROBLEMS  *BOWEL PROBLEMS  UNUSUAL RASH Items with * indicate a potential emergency and should be followed up as soon as possible.  Feel free to call the clinic should you have any questions or concerns. The clinic phone number is [phone].  Please show the CHEMO ALERT CARD at check-in to the Emergency Department and triage nurse.   

## 2020-08-30 ENCOUNTER — Other Ambulatory Visit: Payer: Self-pay | Admitting: Nurse Practitioner

## 2020-08-30 ENCOUNTER — Inpatient Hospital Stay: Payer: Medicare Other

## 2020-08-30 ENCOUNTER — Other Ambulatory Visit: Payer: Self-pay

## 2020-08-30 VITALS — BP 160/81 | HR 97 | Temp 97.8°F | Resp 20

## 2020-08-30 DIAGNOSIS — C3490 Malignant neoplasm of unspecified part of unspecified bronchus or lung: Secondary | ICD-10-CM

## 2020-08-30 DIAGNOSIS — K8689 Other specified diseases of pancreas: Secondary | ICD-10-CM

## 2020-08-30 DIAGNOSIS — Z5111 Encounter for antineoplastic chemotherapy: Secondary | ICD-10-CM | POA: Diagnosis not present

## 2020-08-30 MED ORDER — PEGFILGRASTIM-CBQV 6 MG/0.6ML ~~LOC~~ SOSY
6.0000 mg | PREFILLED_SYRINGE | Freq: Once | SUBCUTANEOUS | Status: AC
  Administered 2020-08-30: 6 mg via SUBCUTANEOUS

## 2020-08-30 MED ORDER — PEGFILGRASTIM-CBQV 6 MG/0.6ML ~~LOC~~ SOSY
PREFILLED_SYRINGE | SUBCUTANEOUS | Status: AC
  Filled 2020-08-30: qty 0.6

## 2020-08-30 NOTE — Patient Instructions (Signed)
Pegfilgrastim injection What is this medicine? PEGFILGRASTIM (PEG fil gra stim) is a long-acting granulocyte colony-stimulating factor that stimulates the growth of neutrophils, a type of white blood cell important in the body's fight against infection. It is used to reduce the incidence of fever and infection in patients with certain types of cancer who are receiving chemotherapy that affects the bone marrow, and to increase survival after being exposed to high doses of radiation. This medicine may be used for other purposes; ask your health care provider or pharmacist if you have questions. COMMON BRAND NAME(S): Fulphila, Neulasta, Nyvepria, UDENYCA, Ziextenzo What should I tell my health care provider before I take this medicine? They need to know if you have any of these conditions:  kidney disease  latex allergy  ongoing radiation therapy  sickle cell disease  skin reactions to acrylic adhesives (On-Body Injector only)  an unusual or allergic reaction to pegfilgrastim, filgrastim, other medicines, foods, dyes, or preservatives  pregnant or trying to get pregnant  breast-feeding How should I use this medicine? This medicine is for injection under the skin. If you get this medicine at home, you will be taught how to prepare and give the pre-filled syringe or how to use the On-body Injector. Refer to the patient Instructions for Use for detailed instructions. Use exactly as directed. Tell your healthcare provider immediately if you suspect that the On-body Injector may not have performed as intended or if you suspect the use of the On-body Injector resulted in a missed or partial dose. It is important that you put your used needles and syringes in a special sharps container. Do not put them in a trash can. If you do not have a sharps container, call your pharmacist or healthcare provider to get one. Talk to your pediatrician regarding the use of this medicine in children. While this drug  may be prescribed for selected conditions, precautions do apply. Overdosage: If you think you have taken too much of this medicine contact a poison control center or emergency room at once. NOTE: This medicine is only for you. Do not share this medicine with others. What if I miss a dose? It is important not to miss your dose. Call your doctor or health care professional if you miss your dose. If you miss a dose due to an On-body Injector failure or leakage, a new dose should be administered as soon as possible using a single prefilled syringe for manual use. What may interact with this medicine? Interactions have not been studied. This list may not describe all possible interactions. Give your health care provider a list of all the medicines, herbs, non-prescription drugs, or dietary supplements you use. Also tell them if you smoke, drink alcohol, or use illegal drugs. Some items may interact with your medicine. What should I watch for while using this medicine? Your condition will be monitored carefully while you are receiving this medicine. You may need blood work done while you are taking this medicine. Talk to your health care provider about your risk of cancer. You may be more at risk for certain types of cancer if you take this medicine. If you are going to need a MRI, CT scan, or other procedure, tell your doctor that you are using this medicine (On-Body Injector only). What side effects may I notice from receiving this medicine? Side effects that you should report to your doctor or health care professional as soon as possible:  allergic reactions (skin rash, itching or hives, swelling of   the face, lips, or tongue)  back pain  dizziness  fever  pain, redness, or irritation at site where injected  pinpoint red spots on the skin  red or dark-brown urine  shortness of breath or breathing problems  stomach or side pain, or pain at the shoulder  swelling  tiredness  trouble  passing urine or change in the amount of urine  unusual bruising or bleeding Side effects that usually do not require medical attention (report to your doctor or health care professional if they continue or are bothersome):  bone pain  muscle pain This list may not describe all possible side effects. Call your doctor for medical advice about side effects. You may report side effects to FDA at 1-800-FDA-1088. Where should I keep my medicine? Keep out of the reach of children. If you are using this medicine at home, you will be instructed on how to store it. Throw away any unused medicine after the expiration date on the label. NOTE: This sheet is a summary. It may not cover all possible information. If you have questions about this medicine, talk to your doctor, pharmacist, or health care provider.  2021 Elsevier/Gold Standard (2019-07-03 13:20:51)  

## 2020-09-15 ENCOUNTER — Other Ambulatory Visit: Payer: Self-pay | Admitting: Oncology

## 2020-09-16 ENCOUNTER — Ambulatory Visit (HOSPITAL_COMMUNITY)
Admission: RE | Admit: 2020-09-16 | Discharge: 2020-09-16 | Disposition: A | Discharge status: Home / Self Care | Payer: Medicare Other | Source: Ambulatory Visit | Attending: Nurse Practitioner | Admitting: Nurse Practitioner

## 2020-09-16 ENCOUNTER — Other Ambulatory Visit: Payer: Self-pay

## 2020-09-16 ENCOUNTER — Encounter (HOSPITAL_COMMUNITY): Payer: Self-pay

## 2020-09-16 DIAGNOSIS — C3490 Malignant neoplasm of unspecified part of unspecified bronchus or lung: Secondary | ICD-10-CM | POA: Diagnosis present

## 2020-09-16 MED ORDER — IOHEXOL 300 MG/ML  SOLN
100.0000 mL | Freq: Once | INTRAMUSCULAR | Status: AC | PRN
  Administered 2020-09-16: 100 mL via INTRAVENOUS

## 2020-09-19 ENCOUNTER — Other Ambulatory Visit: Payer: Self-pay | Admitting: Oncology

## 2020-09-19 ENCOUNTER — Inpatient Hospital Stay: Payer: Medicare Other

## 2020-09-19 ENCOUNTER — Other Ambulatory Visit: Payer: Self-pay

## 2020-09-19 ENCOUNTER — Inpatient Hospital Stay (HOSPITAL_BASED_OUTPATIENT_CLINIC_OR_DEPARTMENT_OTHER): Payer: Medicare Other | Admitting: Oncology

## 2020-09-19 VITALS — BP 160/80 | HR 90 | Temp 97.7°F | Resp 14 | Ht 71.0 in | Wt 187.0 lb

## 2020-09-19 DIAGNOSIS — C3490 Malignant neoplasm of unspecified part of unspecified bronchus or lung: Secondary | ICD-10-CM | POA: Diagnosis not present

## 2020-09-19 DIAGNOSIS — Z5111 Encounter for antineoplastic chemotherapy: Secondary | ICD-10-CM | POA: Diagnosis not present

## 2020-09-19 LAB — CMP (CANCER CENTER ONLY)
ALT: 19 U/L (ref 0–44)
AST: 25 U/L (ref 15–41)
Albumin: 3.2 g/dL — ABNORMAL LOW (ref 3.5–5.0)
Alkaline Phosphatase: 100 U/L (ref 38–126)
Anion gap: 11 (ref 5–15)
BUN: 11 mg/dL (ref 8–23)
CO2: 29 mmol/L (ref 22–32)
Calcium: 8.5 mg/dL — ABNORMAL LOW (ref 8.9–10.3)
Chloride: 98 mmol/L (ref 98–111)
Creatinine: 0.63 mg/dL (ref 0.61–1.24)
GFR, Estimated: >60 mL/min (ref >60)
Glucose, Bld: 105 mg/dL — ABNORMAL HIGH (ref 70–99)
Potassium: 4.1 mmol/L (ref 3.5–5.1)
Sodium: 138 mmol/L (ref 135–145)
Total Bilirubin: 0.5 mg/dL (ref 0.3–1.2)
Total Protein: 6.7 g/dL (ref 6.5–8.1)

## 2020-09-19 LAB — CBC WITH DIFFERENTIAL (CANCER CENTER ONLY)
Abs Immature Granulocytes: 0.06 10*3/uL (ref 0.00–0.07)
Basophils Absolute: 0.1 10*3/uL (ref 0.0–0.1)
Basophils Relative: 1 %
Eosinophils Absolute: 0 10*3/uL (ref 0.0–0.5)
Eosinophils Relative: 0 %
HCT: 46.2 % (ref 39.0–52.0)
Hemoglobin: 15.3 g/dL (ref 13.0–17.0)
Immature Granulocytes: 0 %
Lymphocytes Relative: 10 %
Lymphs Abs: 1.4 10*3/uL (ref 0.7–4.0)
MCH: 32.6 pg (ref 26.0–34.0)
MCHC: 33.1 g/dL (ref 30.0–36.0)
MCV: 98.3 fL (ref 80.0–100.0)
Monocytes Absolute: 1.2 10*3/uL — ABNORMAL HIGH (ref 0.1–1.0)
Monocytes Relative: 8 %
Neutro Abs: 11.3 10*3/uL — ABNORMAL HIGH (ref 1.7–7.7)
Neutrophils Relative %: 81 %
Platelet Count: 225 10*3/uL (ref 150–400)
RBC: 4.7 MIL/uL (ref 4.22–5.81)
RDW: 17 % — ABNORMAL HIGH (ref 11.5–15.5)
WBC Count: 14.1 10*3/uL — ABNORMAL HIGH (ref 4.0–10.5)
nRBC: 0 % (ref 0.0–0.2)

## 2020-09-19 LAB — CEA (IN HOUSE-CHCC): CEA (CHCC-In House): 3497.24 ng/mL — ABNORMAL HIGH (ref 0.00–5.00)

## 2020-09-19 LAB — TOTAL PROTEIN, URINE DIPSTICK: Protein, ur: NEGATIVE mg/dL

## 2020-09-19 MED ORDER — ACETAMINOPHEN 325 MG PO TABS
650.0000 mg | ORAL_TABLET | Freq: Once | ORAL | Status: AC
  Administered 2020-09-19: 650 mg via ORAL

## 2020-09-19 MED ORDER — SODIUM CHLORIDE 0.9% FLUSH
10.0000 mL | INTRAVENOUS | Status: DC | PRN
  Administered 2020-09-19: 10 mL
  Filled 2020-09-19: qty 10

## 2020-09-19 MED ORDER — DIPHENHYDRAMINE HCL 50 MG/ML IJ SOLN
25.0000 mg | Freq: Once | INTRAMUSCULAR | Status: AC
  Administered 2020-09-19: 25 mg via INTRAVENOUS

## 2020-09-19 MED ORDER — DIPHENHYDRAMINE HCL 50 MG/ML IJ SOLN
INTRAMUSCULAR | Status: AC
  Filled 2020-09-19: qty 1

## 2020-09-19 MED ORDER — SODIUM CHLORIDE 0.9 % IV SOLN
Freq: Once | INTRAVENOUS | Status: AC
  Filled 2020-09-19: qty 250

## 2020-09-19 MED ORDER — ACETAMINOPHEN 325 MG PO TABS
ORAL_TABLET | ORAL | Status: AC
  Filled 2020-09-19: qty 2

## 2020-09-19 MED ORDER — SODIUM CHLORIDE 0.9 % IV SOLN
60.0000 mg/m2 | Freq: Once | INTRAVENOUS | Status: AC
  Administered 2020-09-19: 120 mg via INTRAVENOUS
  Filled 2020-09-19: qty 12

## 2020-09-19 MED ORDER — SODIUM CHLORIDE 0.9 % IV SOLN
10.0000 mg/kg | Freq: Once | INTRAVENOUS | Status: AC
  Administered 2020-09-19: 800 mg via INTRAVENOUS
  Filled 2020-09-19: qty 50

## 2020-09-19 MED ORDER — HEPARIN SOD (PORK) LOCK FLUSH 100 UNIT/ML IV SOLN
500.0000 [IU] | Freq: Once | INTRAVENOUS | Status: AC | PRN
  Administered 2020-09-19: 500 [IU]
  Filled 2020-09-19: qty 5

## 2020-09-19 MED ORDER — SODIUM CHLORIDE 0.9 % IV SOLN
10.0000 mg | Freq: Once | INTRAVENOUS | Status: AC
  Administered 2020-09-19: 10 mg via INTRAVENOUS
  Filled 2020-09-19: qty 10

## 2020-09-19 NOTE — Patient Instructions (Signed)
[facility] Discharge Instructions for Patients Receiving Chemotherapy  Today you received the following chemotherapy agents ramicirumab, docetaxel  To help prevent nausea and vomiting after your treatment, we encourage you to take your nausea medication as directed.   If you develop nausea and vomiting that is not controlled by your nausea medication, call the clinic.   BELOW ARE SYMPTOMS THAT SHOULD BE REPORTED IMMEDIATELY:  *FEVER GREATER THAN 100.5 F  *CHILLS WITH OR WITHOUT FEVER  NAUSEA AND VOMITING THAT IS NOT CONTROLLED WITH YOUR NAUSEA MEDICATION  *UNUSUAL SHORTNESS OF BREATH  *UNUSUAL BRUISING OR BLEEDING  TENDERNESS IN MOUTH AND THROAT WITH OR WITHOUT PRESENCE OF ULCERS  *URINARY PROBLEMS  *BOWEL PROBLEMS  UNUSUAL RASH Items with * indicate a potential emergency and should be followed up as soon as possible.  Feel free to call the clinic should you have any questions or concerns. The clinic phone number is [phone].  Please show the [place] at check-in to the Emergency Department and triage nurse.

## 2020-09-19 NOTE — Progress Notes (Signed)
[facility] OFFICE PROGRESS NOTE   Diagnosis: Non-small cell lung cancer  INTERVAL HISTORY:   Ricky Conway completed another cycle of docetaxel/ramucirumab on 08/29/2020.  His only complaint is pain at the right shoulder with movement and lifting.  No pain at rest.  No neuropathy symptoms.  Stable dyspnea.  He reports swelling in the legs and feet.  Objective:  Vital signs in last 24 hours:  Blood pressure (!) 160/80, pulse 90, temperature 97.7 F (36.5 C), temperature source Tympanic, resp. rate 14, height _0  (1.803 m), weight 187 lb (84.8 kg), SpO2 100 %.    HEENT: No thrush or ulcers Resp: Lungs clear bilaterally Cardio: Regular rate and rhythm GI: No hepatosplenomegaly Vascular: Trace edema with chronic stasis change at the lower leg and feet bilaterally  Skin: Erythema at the lower legs and feet, palms and soles without erythema or skin breakdown  Portacath/PICC-without erythema  Lab Results:  Lab Results  Component Value Date   WBC 14.1 (H) 09/19/2020   HGB 15.3 09/19/2020   HCT 46.2 09/19/2020   MCV 98.3 09/19/2020   PLT 225 09/19/2020   NEUTROABS 11.3 (H) 09/19/2020    CMP  Lab Results  Component Value Date   NA 136 08/29/2020   K 4.0 08/29/2020   CL 100 08/29/2020   CO2 29 08/29/2020   GLUCOSE 103 (H) 08/29/2020   BUN 9 08/29/2020   CREATININE 0.63 08/29/2020   CALCIUM 8.7 (L) 08/29/2020   PROT 6.6 08/29/2020   ALBUMIN 3.1 (L) 08/29/2020   AST 24 08/29/2020   ALT 18 08/29/2020   ALKPHOS 100 08/29/2020   BILITOT 0.5 08/29/2020   GFRNONAA >60 08/29/2020   GFRAA >60 03/15/2020    Lab Results  Component Value Date   CEA1 0,037.04 (H) 08/29/2020     Imaging:  CT Chest W Contrast  Result Date: 09/16/2020 CLINICAL DATA:  Non-small-cell lung cancer with metastasis. Evaluate treatment response. Ongoing immunotherapy. Asymptomatic. EXAM: CT CHEST, ABDOMEN, AND PELVIS WITH CONTRAST TECHNIQUE: Multidetector CT imaging of the chest,  abdomen and pelvis was performed following the standard protocol during bolus administration of intravenous contrast. CONTRAST:  174m OMNIPAQUE IOHEXOL 300 MG/ML  SOLN COMPARISON:  06/16/2020 FINDINGS: CT CHEST FINDINGS Cardiovascular: Right Port-A-Cath tip low right atrium. Aortic and branch vessel atherosclerosis. Normal heart size, without pericardial effusion. Multivessel coronary artery atherosclerosis. Tortuous thoracic aorta. No central pulmonary embolism, on this non-dedicated study. Mediastinum/Nodes: No supraclavicular adenopathy. No mediastinal or hilar adenopathy. Lungs/Pleura: Trace left-sided pleural fluid is new. Moderate centrilobular emphysema. Presumed secretions within the dependent trachea and right mainstem bronchus. Probable subpleural lymph node along the right minor fissure is unchanged at 5 mm on 106/6. Musculoskeletal: Anterior T12 sclerotic lesion of 9 mm on 53/2 is similar to on the prior exam (when remeasured). Remote left ninth and tenth rib fractures. Smaller upper thoracic vertebral body sclerotic lesions are also unchanged. CT ABDOMEN PELVIS FINDINGS Hepatobiliary: High left hepatic lobe lesion measures 3.9 x 3.2 cm on 55/2 versus 4.1 x 3.2 cm on the prior exam (when remeasured). Anterior segment 2 lesion measures 1.5 cm on 57/2 versus 1.9 cm on the prior exam (when remeasured). Inferior right hepatic lobe lesion measures 2.1 cm on 71/2 versus 2.4 cm on the prior exam (when remeasured). Common duct stent with pneumobilia and no intrahepatic duct dilatation. At least 1 gallstone without acute cholecystitis. Pancreas: Normal, without mass or ductal dilatation. Spleen: Normal in size, without focal abnormality. Adrenals/Urinary Tract: Normal adrenal glands. Normal right  kidney. Left renal too small to characterize lesions. No hydronephrosis. Normal urinary bladder. Stomach/Bowel: Gastric antral underdistention. Scattered colonic diverticula. Normal terminal ileum and appendix. Normal  small bowel. Vascular/Lymphatic: Infrarenal abdominal aortic dilatation of maximally 2.8 cm is not significantly changed. No abdominopelvic adenopathy. Reproductive: Mild prostatomegaly. Other: No significant free fluid. No evidence of omental or peritoneal disease. Musculoskeletal: Degenerative partial fusion of the right sacroiliac joint. Lumbosacral spondylosis with mild S shaped thoracolumbar spine curvature. IMPRESSION: 1. Response to therapy of hepatic metastasis. 2. Similar osseous metastasis. 3. Common duct stent in place without biliary duct dilatation. 4. Aortic atherosclerosis (ICD10-I70.0), coronary artery atherosclerosis and emphysema (ICD10-J43.9). 5. Cholelithiasis 6.  Possible constipation. Electronically Signed   By: Abigail Miyamoto M.D.   On: 09/16/2020 16:12   CT Abdomen Pelvis W Contrast  Result Date: 09/16/2020 CLINICAL DATA:  Non-small-cell lung cancer with metastasis. Evaluate treatment response. Ongoing immunotherapy. Asymptomatic. EXAM: CT CHEST, ABDOMEN, AND PELVIS WITH CONTRAST TECHNIQUE: Multidetector CT imaging of the chest, abdomen and pelvis was performed following the standard protocol during bolus administration of intravenous contrast. CONTRAST:  192m OMNIPAQUE IOHEXOL 300 MG/ML  SOLN COMPARISON:  06/16/2020 FINDINGS: CT CHEST FINDINGS Cardiovascular: Right Port-A-Cath tip low right atrium. Aortic and branch vessel atherosclerosis. Normal heart size, without pericardial effusion. Multivessel coronary artery atherosclerosis. Tortuous thoracic aorta. No central pulmonary embolism, on this non-dedicated study. Mediastinum/Nodes: No supraclavicular adenopathy. No mediastinal or hilar adenopathy. Lungs/Pleura: Trace left-sided pleural fluid is new. Moderate centrilobular emphysema. Presumed secretions within the dependent trachea and right mainstem bronchus. Probable subpleural lymph node along the right minor fissure is unchanged at 5 mm on 106/6. Musculoskeletal: Anterior T12  sclerotic lesion of 9 mm on 53/2 is similar to on the prior exam (when remeasured). Remote left ninth and tenth rib fractures. Smaller upper thoracic vertebral body sclerotic lesions are also unchanged. CT ABDOMEN PELVIS FINDINGS Hepatobiliary: High left hepatic lobe lesion measures 3.9 x 3.2 cm on 55/2 versus 4.1 x 3.2 cm on the prior exam (when remeasured). Anterior segment 2 lesion measures 1.5 cm on 57/2 versus 1.9 cm on the prior exam (when remeasured). Inferior right hepatic lobe lesion measures 2.1 cm on 71/2 versus 2.4 cm on the prior exam (when remeasured). Common duct stent with pneumobilia and no intrahepatic duct dilatation. At least 1 gallstone without acute cholecystitis. Pancreas: Normal, without mass or ductal dilatation. Spleen: Normal in size, without focal abnormality. Adrenals/Urinary Tract: Normal adrenal glands. Normal right kidney. Left renal too small to characterize lesions. No hydronephrosis. Normal urinary bladder. Stomach/Bowel: Gastric antral underdistention. Scattered colonic diverticula. Normal terminal ileum and appendix. Normal small bowel. Vascular/Lymphatic: Infrarenal abdominal aortic dilatation of maximally 2.8 cm is not significantly changed. No abdominopelvic adenopathy. Reproductive: Mild prostatomegaly. Other: No significant free fluid. No evidence of omental or peritoneal disease. Musculoskeletal: Degenerative partial fusion of the right sacroiliac joint. Lumbosacral spondylosis with mild S shaped thoracolumbar spine curvature. IMPRESSION: 1. Response to therapy of hepatic metastasis. 2. Similar osseous metastasis. 3. Common duct stent in place without biliary duct dilatation. 4. Aortic atherosclerosis (ICD10-I70.0), coronary artery atherosclerosis and emphysema (ICD10-J43.9). 5. Cholelithiasis 6.  Possible constipation. Electronically Signed   By: KAbigail MiyamotoM.D.   On: 09/16/2020 16:12    Medications: I have reviewed the patient's current  medications.   Assessment/Plan: 1. Pancreas mass, liver lesions  Abdominal ultrasound 09/03/2018-3 liver lesions. Intra-and extrahepatic biliary ductal dilatation.  Presentation to the emergency department 09/08/2018 with painless jaundice. Initial bilirubin returned at 18.3.   MRI abdomen 09/08/2018-1.9 x  2.6 cm hypoenhancing lesion arising exophytically along the posterior aspect of the pancreatic head suspicious for pancreatic adenocarcinoma. There was suspected involvement of the distal common duct with secondary intrahepatic/extrahepatic ductal dilatation. Lesion noted to abut the IVC. Multiple liver metastases noted. Small upper abdominal/retroperitoneal lymph nodes noted.   ERCP 09/09/2018-high-grade stricture and obstruction of the distal common bile duct. A metallic biliary stent was placed. Brushings in the mid and lower third of the main bile duct were obtained with no malignant cells identified.   Bilirubin improved at 4.7 on 09/10/2018.   Biopsy of a liver lesion on 09/11/2018-metastatic carcinoma, cytokeratin 7 and TTF-1 positive, negative for cytokeratin 20, CDX 2, and PSA. Foundation 1-microsatellite stable, tumor mutation burden 16, ERBB2 amplification  CT chest 09/22/2018-right hilar/mediastinal/retrocrural lymphadenopathy, ill-defined right lower lobe nodularity, emphysema  Cycle 1 carboplatin/Alimta/pembrolizumab 10/09/2018  Cycle 2 carboplatin/Alimta/pembrolizumab 10/30/2018  Cycle 3 carboplatin/Alimta/pembrolizumab 11/20/2018  Cycle 4 carboplatin/Alimta/pembrolizumab 12/11/2018  CT 12/26/2018-marked response to therapy of the radical abdominal adenopathy, decreased size of hepatic metastases, no new or progressive disease  Cycle 5 Alimta/pembrolizumab 01/01/2019  Cycle 6 Alimta/pembrolizumab 01/23/2019  Cycle 7 Alimta/pembrolizumab 02/12/2019  Cycle 8 Alimta/pembrolizumab 03/05/2019  Cycle 9 Alimta/pembrolizumab 03/26/2019  CTs 04/14/2019-slight interval  decrease in size and conspicuity of multiple, irregular low-attenuation liver lesions. No new liver lesions. No recurrent mediastinal or hilar lymphadenopathy. No change in mildly prominent subcentimeter periaortic and peripheral lymph nodes without evidence of recurrent abdominal or pelvic lymphadenopathy.  Cycle 10 Alimta/pembrolizumab 04/16/2019  Cycle 11 pembrolizumab 05/07/2019 (Alimta held secondary to increased tearing and leg edema)  Cycle 12 Alimta/pembrolizumab 05/28/2019  Cycle 13 Alimta/pembrolizumab 06/18/2019  Restaging CTs 07/08/2019-worsening of low-density hepatic lesions. Scattered small lymph nodes with increasing conspicuity, largest along the left periaortic chain approximately 9 mm previously approximately 5 mm.  Cycle 14 Alimta/pembrolizumab 07/09/2019  Cycle 15 Alimta/pembrolizumab 07/31/2019  Cycle 16 Alimta/pembrolizumab 08/20/2019  Rising CEA beginning December 2020  CTs 09/08/2019-enlargement of liver metastases, mild enlargement of abdominal/retroperitoneal and lower thoracic nodes, nonspecific pulmonary nodules-stable  Cycle 1 gemcitabine 09/17/2019  Cycle 2 gemcitabine 10/01/2019  Cycle 3 gemcitabine 10/15/2019  Cycle 4 gemcitabine 10/29/2019  Cycle 5 gemcitabine 11/12/2019  Rising CEA 10/2019  CTs 11/19/19 mixed response in liver, stable pulmonary nodules, thoracic and RP nodes, overall stable  Plan to intensify chemo carboplatin AUC 4 plus gemcitabine given on day 1 q21 days starting ~12/03/19  Admission 11/30/2019 with recurrent biliary obstruction-ERCP 12/02/2019-obstructed biliary stent, new stent placed  Cycle 1 gemcitabine/carboplatin 12/24/2019  Cycle 2 gemcitabine/carboplatin 01/14/2020  Cycle 3 gemcitabine/carboplatin 02/04/2020  Cycle 4 gemcitabine/carboplatin 02/25/2020  CTs 03/15/2020-decrease in size of hepatic metastatic lesions. No new liver lesions. Newsclerotic lesions in the thoracic and lumbar spine. Stable small pulmonary nodules.  Asymmetric wall thickening of the medial wall of the gallbladder new since previous exam without signs of inflammation.  Cycle5gemcitabine/carboplatin 03/17/2020  Cycle 6 gemcitabine/carboplatin 04/15/2020  Cycle 7 gemcitabine/carboplatin 05/09/2020  Cycle 8 gemcitabine/carboplatin 05/31/2020  CTs 06/16/2020-unchanged sclerotic bone lesions, increase in size and number of liver metastases  Cycle 1 docetaxel/ramucirumab 06/27/2020  Cycle 2 docetaxel/ramucirumab 07/18/2020  Cycle 3 docetaxel/ramucirumab 08/08/2020  Cycle 4 docetaxel/ramucirumab 08/29/2020  CT 09/16/2020-decreased size of liver lesions, stable bone metastases  Cycle 5 docetaxel/ramucirumab 09/19/2020 2. COPD 3. Hypertension 4. Trace edema right leg 12/11/2018. Doppler negative for DVT 12/12/2018. 5. Watery eyes, runny nose reported 12/11/2018. Question related to Alimta. Persistent watery eyes 04/16/2019. Ophthalmology evaluation 05/11/2019-blepharitis,ectropion left lower lid, macular degeneration 6. Admission 12/24/2018 with a fever-no source for infection identified, completed  outpatient course of ciprofloxacin 7. Port-A-Cath placement 09/14/2019, interventional radiology 8. Colonoscopy 06/16/2015-diverticulosis in the sigmoid colon, examination otherwise normal     Disposition: Ricky Conway appears stable.  He has completed 4 treatments with docetaxel/ramucirumab.  The restaging CT reveals general improvement in the size of liver metastases.  His overall clinical status appears unchanged.  I reviewed the CT images with Ricky Conway.  I discussed the restaging findings with Ricky Conway wife by telephone.  I recommend continuing docetaxel/ramucirumab.  He agrees.  He will complete cycle 5 today.  The etiology of the right shoulder pain is unclear.  This is most likely related to a benign musculoskeletal condition, though he does have CT evidence for bone metastases at other sites.  We will obtain a plain x-ray of the right  shoulder today.  We will schedule a CT or bone scan if the pain progresses.  He has trace edema at the lower legs and feet with chronic stasis change of the skin.  I recommended he continue furosemide and elevate the feet.  Ricky Conway will return for an office visit in the next cycle of chemotherapy in 3 weeks.  [name], MD  09/19/2020  9:51 AM

## 2020-09-20 ENCOUNTER — Inpatient Hospital Stay: Payer: Medicare Other

## 2020-09-20 VITALS — BP 155/82 | HR 86 | Temp 97.9°F | Resp 18

## 2020-09-20 DIAGNOSIS — C3490 Malignant neoplasm of unspecified part of unspecified bronchus or lung: Secondary | ICD-10-CM

## 2020-09-20 DIAGNOSIS — Z5111 Encounter for antineoplastic chemotherapy: Secondary | ICD-10-CM | POA: Diagnosis not present

## 2020-09-20 MED ORDER — PEGFILGRASTIM-CBQV 6 MG/0.6ML ~~LOC~~ SOSY
6.0000 mg | PREFILLED_SYRINGE | Freq: Once | SUBCUTANEOUS | Status: AC
  Administered 2020-09-20: 6 mg via SUBCUTANEOUS

## 2020-09-20 MED ORDER — PEGFILGRASTIM-CBQV 6 MG/0.6ML ~~LOC~~ SOSY
PREFILLED_SYRINGE | SUBCUTANEOUS | Status: AC
  Filled 2020-09-20: qty 0.6

## 2020-09-20 NOTE — Patient Instructions (Signed)
Pegfilgrastim injection What is this medicine? PEGFILGRASTIM (PEG fil gra stim) is a long-acting granulocyte colony-stimulating factor that stimulates the growth of neutrophils, a type of white blood cell important in the body's fight against infection. It is used to reduce the incidence of fever and infection in patients with certain types of cancer who are receiving chemotherapy that affects the bone marrow, and to increase survival after being exposed to high doses of radiation. This medicine may be used for other purposes; ask your health care provider or pharmacist if you have questions. COMMON BRAND NAME(S): Fulphila, Neulasta, Nyvepria, UDENYCA, Ziextenzo What should I tell my health care provider before I take this medicine? They need to know if you have any of these conditions:  kidney disease  latex allergy  ongoing radiation therapy  sickle cell disease  skin reactions to acrylic adhesives (On-Body Injector only)  an unusual or allergic reaction to pegfilgrastim, filgrastim, other medicines, foods, dyes, or preservatives  pregnant or trying to get pregnant  breast-feeding How should I use this medicine? This medicine is for injection under the skin. If you get this medicine at home, you will be taught how to prepare and give the pre-filled syringe or how to use the On-body Injector. Refer to the patient Instructions for Use for detailed instructions. Use exactly as directed. Tell your healthcare provider immediately if you suspect that the On-body Injector may not have performed as intended or if you suspect the use of the On-body Injector resulted in a missed or partial dose. It is important that you put your used needles and syringes in a special sharps container. Do not put them in a trash can. If you do not have a sharps container, call your pharmacist or healthcare provider to get one. Talk to your pediatrician regarding the use of this medicine in children. While this drug  may be prescribed for selected conditions, precautions do apply. Overdosage: If you think you have taken too much of this medicine contact a poison control center or emergency room at once. NOTE: This medicine is only for you. Do not share this medicine with others. What if I miss a dose? It is important not to miss your dose. Call your doctor or health care professional if you miss your dose. If you miss a dose due to an On-body Injector failure or leakage, a new dose should be administered as soon as possible using a single prefilled syringe for manual use. What may interact with this medicine? Interactions have not been studied. This list may not describe all possible interactions. Give your health care provider a list of all the medicines, herbs, non-prescription drugs, or dietary supplements you use. Also tell them if you smoke, drink alcohol, or use illegal drugs. Some items may interact with your medicine. What should I watch for while using this medicine? Your condition will be monitored carefully while you are receiving this medicine. You may need blood work done while you are taking this medicine. Talk to your health care provider about your risk of cancer. You may be more at risk for certain types of cancer if you take this medicine. If you are going to need a MRI, CT scan, or other procedure, tell your doctor that you are using this medicine (On-Body Injector only). What side effects may I notice from receiving this medicine? Side effects that you should report to your doctor or health care professional as soon as possible:  allergic reactions (skin rash, itching or hives, swelling of   the face, lips, or tongue)  back pain  dizziness  fever  pain, redness, or irritation at site where injected  pinpoint red spots on the skin  red or dark-brown urine  shortness of breath or breathing problems  stomach or side pain, or pain at the shoulder  swelling  tiredness  trouble  passing urine or change in the amount of urine  unusual bruising or bleeding Side effects that usually do not require medical attention (report to your doctor or health care professional if they continue or are bothersome):  bone pain  muscle pain This list may not describe all possible side effects. Call your doctor for medical advice about side effects. You may report side effects to FDA at 1-800-FDA-1088. Where should I keep my medicine? Keep out of the reach of children. If you are using this medicine at home, you will be instructed on how to store it. Throw away any unused medicine after the expiration date on the label. NOTE: This sheet is a summary. It may not cover all possible information. If you have questions about this medicine, talk to your doctor, pharmacist, or health care provider.  2021 Elsevier/Gold Standard (2019-07-03 13:20:51)  

## 2020-09-21 ENCOUNTER — Telehealth: Payer: Self-pay

## 2020-09-21 NOTE — Telephone Encounter (Signed)
Called patient to inquire why he didn't get his shoulder x-ray done on Monday.  He said the nurse in infusion told him he didn't have an appointment.  I explained to him there isn't an appointment he just needs to go to [facility] radiology any time that it has been ordered.  He states he probably won't go until sometime next week because he doesn't feel very well for a few days after chemo.

## 2020-09-30 ENCOUNTER — Other Ambulatory Visit: Payer: Self-pay

## 2020-09-30 ENCOUNTER — Ambulatory Visit (HOSPITAL_COMMUNITY)
Admission: RE | Admit: 2020-09-30 | Discharge: 2020-09-30 | Disposition: A | Discharge status: Home / Self Care | Payer: Medicare Other | Source: Ambulatory Visit | Attending: Oncology | Admitting: Oncology

## 2020-09-30 DIAGNOSIS — C3490 Malignant neoplasm of unspecified part of unspecified bronchus or lung: Secondary | ICD-10-CM | POA: Diagnosis present

## 2020-10-05 ENCOUNTER — Other Ambulatory Visit: Payer: Self-pay | Admitting: Oncology

## 2020-10-10 ENCOUNTER — Inpatient Hospital Stay: Payer: Medicare Other

## 2020-10-10 ENCOUNTER — Other Ambulatory Visit: Payer: Self-pay

## 2020-10-10 ENCOUNTER — Telehealth: Payer: Self-pay | Admitting: Oncology

## 2020-10-10 ENCOUNTER — Inpatient Hospital Stay: Payer: Medicare Other | Attending: Oncology

## 2020-10-10 ENCOUNTER — Inpatient Hospital Stay (HOSPITAL_BASED_OUTPATIENT_CLINIC_OR_DEPARTMENT_OTHER): Payer: Medicare Other | Admitting: Oncology

## 2020-10-10 VITALS — BP 173/95 | HR 65 | Temp 97.0°F | Resp 18

## 2020-10-10 VITALS — BP 160/80 | HR 70 | Temp 97.8°F | Resp 18 | Ht 71.0 in | Wt 186.0 lb

## 2020-10-10 DIAGNOSIS — Z5112 Encounter for antineoplastic immunotherapy: Secondary | ICD-10-CM | POA: Diagnosis present

## 2020-10-10 DIAGNOSIS — Z95828 Presence of other vascular implants and grafts: Secondary | ICD-10-CM

## 2020-10-10 DIAGNOSIS — C787 Secondary malignant neoplasm of liver and intrahepatic bile duct: Secondary | ICD-10-CM | POA: Diagnosis present

## 2020-10-10 DIAGNOSIS — C3490 Malignant neoplasm of unspecified part of unspecified bronchus or lung: Secondary | ICD-10-CM | POA: Diagnosis not present

## 2020-10-10 DIAGNOSIS — R5383 Other fatigue: Secondary | ICD-10-CM | POA: Diagnosis not present

## 2020-10-10 DIAGNOSIS — Z5189 Encounter for other specified aftercare: Secondary | ICD-10-CM | POA: Diagnosis not present

## 2020-10-10 DIAGNOSIS — C3431 Malignant neoplasm of lower lobe, right bronchus or lung: Secondary | ICD-10-CM | POA: Insufficient documentation

## 2020-10-10 DIAGNOSIS — R970 Elevated carcinoembryonic antigen [CEA]: Secondary | ICD-10-CM | POA: Diagnosis not present

## 2020-10-10 DIAGNOSIS — Z79899 Other long term (current) drug therapy: Secondary | ICD-10-CM | POA: Insufficient documentation

## 2020-10-10 DIAGNOSIS — Z5111 Encounter for antineoplastic chemotherapy: Secondary | ICD-10-CM | POA: Insufficient documentation

## 2020-10-10 LAB — CMP (CANCER CENTER ONLY)
ALT: 16 U/L (ref 0–44)
AST: 22 U/L (ref 15–41)
Albumin: 3.5 g/dL (ref 3.5–5.0)
Alkaline Phosphatase: 81 U/L (ref 38–126)
Anion gap: 6 (ref 5–15)
BUN: 12 mg/dL (ref 8–23)
CO2: 32 mmol/L (ref 22–32)
Calcium: 8.8 mg/dL — ABNORMAL LOW (ref 8.9–10.3)
Chloride: 99 mmol/L (ref 98–111)
Creatinine: 0.52 mg/dL — ABNORMAL LOW (ref 0.61–1.24)
GFR, Estimated: >60 mL/min (ref >60)
Glucose, Bld: 105 mg/dL — ABNORMAL HIGH (ref 70–99)
Potassium: 4.1 mmol/L (ref 3.5–5.1)
Sodium: 137 mmol/L (ref 135–145)
Total Bilirubin: 0.6 mg/dL (ref 0.3–1.2)
Total Protein: 6.1 g/dL — ABNORMAL LOW (ref 6.5–8.1)

## 2020-10-10 LAB — TOTAL PROTEIN, URINE DIPSTICK

## 2020-10-10 LAB — CBC WITH DIFFERENTIAL (CANCER CENTER ONLY)
Abs Immature Granulocytes: 0.05 10*3/uL (ref 0.00–0.07)
Basophils Absolute: 0.1 10*3/uL (ref 0.0–0.1)
Basophils Relative: 1 %
Eosinophils Absolute: 0 10*3/uL (ref 0.0–0.5)
Eosinophils Relative: 0 %
HCT: 45.4 % (ref 39.0–52.0)
Hemoglobin: 15.1 g/dL (ref 13.0–17.0)
Immature Granulocytes: 0 %
Lymphocytes Relative: 9 %
Lymphs Abs: 1.3 10*3/uL (ref 0.7–4.0)
MCH: 32.9 pg (ref 26.0–34.0)
MCHC: 33.3 g/dL (ref 30.0–36.0)
MCV: 98.9 fL (ref 80.0–100.0)
Monocytes Absolute: 1.4 10*3/uL — ABNORMAL HIGH (ref 0.1–1.0)
Monocytes Relative: 9 %
Neutro Abs: 11.6 10*3/uL — ABNORMAL HIGH (ref 1.7–7.7)
Neutrophils Relative %: 81 %
Platelet Count: 191 10*3/uL (ref 150–400)
RBC: 4.59 MIL/uL (ref 4.22–5.81)
RDW: 17.1 % — ABNORMAL HIGH (ref 11.5–15.5)
WBC Count: 14.4 10*3/uL — ABNORMAL HIGH (ref 4.0–10.5)
nRBC: 0 % (ref 0.0–0.2)

## 2020-10-10 MED ORDER — SODIUM CHLORIDE 0.9 % IV SOLN
10.0000 mg | Freq: Once | INTRAVENOUS | Status: AC
  Administered 2020-10-10: 10 mg via INTRAVENOUS
  Filled 2020-10-10: qty 1

## 2020-10-10 MED ORDER — HEPARIN SOD (PORK) LOCK FLUSH 100 UNIT/ML IV SOLN
500.0000 [IU] | Freq: Once | INTRAVENOUS | Status: AC | PRN
Start: 2020-10-10 — End: 2020-10-10
  Administered 2020-10-10: 500 [IU]
  Filled 2020-10-10: qty 5

## 2020-10-10 MED ORDER — DIPHENHYDRAMINE HCL 50 MG/ML IJ SOLN
INTRAMUSCULAR | Status: AC
  Filled 2020-10-10: qty 1

## 2020-10-10 MED ORDER — SODIUM CHLORIDE 0.9% FLUSH
10.0000 mL | INTRAVENOUS | Status: DC | PRN
  Administered 2020-10-10: 10 mL
  Filled 2020-10-10: qty 10

## 2020-10-10 MED ORDER — ACETAMINOPHEN 325 MG PO TABS
ORAL_TABLET | ORAL | Status: AC
  Filled 2020-10-10: qty 2

## 2020-10-10 MED ORDER — ACETAMINOPHEN 325 MG PO TABS
650.0000 mg | ORAL_TABLET | Freq: Once | ORAL | Status: AC
  Administered 2020-10-10: 650 mg via ORAL

## 2020-10-10 MED ORDER — DIPHENHYDRAMINE HCL 50 MG/ML IJ SOLN
25.0000 mg | Freq: Once | INTRAMUSCULAR | Status: AC
Start: 2020-10-10 — End: 2020-10-10
  Administered 2020-10-10: 25 mg via INTRAVENOUS

## 2020-10-10 MED ORDER — DIPHENHYDRAMINE HCL 25 MG PO CAPS
ORAL_CAPSULE | ORAL | Status: AC
  Filled 2020-10-10: qty 1

## 2020-10-10 MED ORDER — SODIUM CHLORIDE 0.9 % IV SOLN
10.0000 mg/kg | Freq: Once | INTRAVENOUS | Status: AC
  Administered 2020-10-10: 800 mg via INTRAVENOUS
  Filled 2020-10-10: qty 50

## 2020-10-10 MED ORDER — SODIUM CHLORIDE 0.9 % IV SOLN
60.0000 mg/m2 | Freq: Once | INTRAVENOUS | Status: AC
  Administered 2020-10-10: 120 mg via INTRAVENOUS
  Filled 2020-10-10: qty 12

## 2020-10-10 MED ORDER — SODIUM CHLORIDE 0.9 % IV SOLN
Freq: Once | INTRAVENOUS | Status: AC
Start: 2020-10-10 — End: 2020-10-10
  Filled 2020-10-10: qty 250

## 2020-10-10 NOTE — Patient Instructions (Signed)
[facility] Discharge Instructions for Patients Receiving Chemotherapy  Today you received the following chemotherapy agents Cyramza, Taxotere  To help prevent nausea and vomiting after your treatment, we encourage you to take your nausea medication: Prochlorperazine 5mg  every 6 hours as needed for nausea or vomiting   If you develop nausea and vomiting that is not controlled by your nausea medication, call the clinic.   BELOW ARE SYMPTOMS THAT SHOULD BE REPORTED IMMEDIATELY:  *FEVER GREATER THAN 100.5 F  *CHILLS WITH OR WITHOUT FEVER  NAUSEA AND VOMITING THAT IS NOT CONTROLLED WITH YOUR NAUSEA MEDICATION  *UNUSUAL SHORTNESS OF BREATH  *UNUSUAL BRUISING OR BLEEDING  TENDERNESS IN MOUTH AND THROAT WITH OR WITHOUT PRESENCE OF ULCERS  *URINARY PROBLEMS  *BOWEL PROBLEMS  UNUSUAL RASH Items with * indicate a potential emergency and should be followed up as soon as possible.  Feel free to call the clinic should you have any questions or concerns at The clinic phone number is [phone].  Please show the [facility] at check-in to the Emergency Department and triage nurse.

## 2020-10-10 NOTE — Progress Notes (Deleted)
[facility] OFFICE PROGRESS NOTE   Diagnosis:   INTERVAL HISTORY:   ***  Objective:  Vital signs in last 24 hours:  Blood pressure (!) 160/80, pulse 70, temperature 97.8 F (36.6 C), temperature source Tympanic, resp. rate 18, height '5\' 11"'  (1.803 m), weight 186 lb (84.4 kg), SpO2 90 %.    HEENT: *** Lymphatics: *** Resp: *** Cardio: *** GI: *** Vascular: *** Neuro:***  Skin:***   Portacath/PICC-without erythema  Lab Results:  Lab Results  Component Value Date   WBC 14.4 (H) 10/10/2020   HGB 15.1 10/10/2020   HCT 45.4 10/10/2020   MCV 98.9 10/10/2020   PLT 191 10/10/2020   NEUTROABS 11.6 (H) 10/10/2020    CMP  Lab Results  Component Value Date   NA 137 10/10/2020   K 4.1 10/10/2020   CL 99 10/10/2020   CO2 32 10/10/2020   GLUCOSE 105 (H) 10/10/2020   BUN 12 10/10/2020   CREATININE 0.52 (L) 10/10/2020   CALCIUM 8.8 (L) 10/10/2020   PROT 6.1 (L) 10/10/2020   ALBUMIN 3.5 10/10/2020   AST 22 10/10/2020   ALT 16 10/10/2020   ALKPHOS 81 10/10/2020   BILITOT 0.6 10/10/2020   GFRNONAA >60 10/10/2020   GFRAA >60 03/15/2020    Lab Results  Component Value Date   CEA1 8,416.60 (H) 09/19/2020    Medications: I have reviewed the patient's current medications.   Assessment/Plan: 1. Pancreas mass, liver lesions  Abdominal ultrasound 09/03/2018-3 liver lesions. Intra-and extrahepatic biliary ductal dilatation.  Presentation to the emergency department 09/08/2018 with painless jaundice. Initial bilirubin returned at 18.3.   MRI abdomen 09/08/2018-1.9 x 2.6 cm hypoenhancing lesion arising exophytically along the posterior aspect of the pancreatic head suspicious for pancreatic adenocarcinoma. There was suspected involvement of the distal common duct with secondary intrahepatic/extrahepatic ductal dilatation. Lesion noted to abut the IVC. Multiple liver metastases noted. Small upper abdominal/retroperitoneal lymph nodes noted.   ERCP  09/09/2018-high-grade stricture and obstruction of the distal common bile duct. A metallic biliary stent was placed. Brushings in the mid and lower third of the main bile duct were obtained with no malignant cells identified.   Bilirubin improved at 4.7 on 09/10/2018.   Biopsy of a liver lesion on 09/11/2018-metastatic carcinoma, cytokeratin 7 and TTF-1 positive, negative for cytokeratin 20, CDX 2, and PSA. Foundation 1-microsatellite stable, tumor mutation burden 16, ERBB2 amplification  CT chest 09/22/2018-right hilar/mediastinal/retrocrural lymphadenopathy, ill-defined right lower lobe nodularity, emphysema  Cycle 1 carboplatin/Alimta/pembrolizumab 10/09/2018  Cycle 2 carboplatin/Alimta/pembrolizumab 10/30/2018  Cycle 3 carboplatin/Alimta/pembrolizumab 11/20/2018  Cycle 4 carboplatin/Alimta/pembrolizumab 12/11/2018  CT 12/26/2018-marked response to therapy of the radical abdominal adenopathy, decreased size of hepatic metastases, no new or progressive disease  Cycle 5 Alimta/pembrolizumab 01/01/2019  Cycle 6 Alimta/pembrolizumab 01/23/2019  Cycle 7 Alimta/pembrolizumab 02/12/2019  Cycle 8 Alimta/pembrolizumab 03/05/2019  Cycle 9 Alimta/pembrolizumab 03/26/2019  CTs 04/14/2019-slight interval decrease in size and conspicuity of multiple, irregular low-attenuation liver lesions. No new liver lesions. No recurrent mediastinal or hilar lymphadenopathy. No change in mildly prominent subcentimeter periaortic and peripheral lymph nodes without evidence of recurrent abdominal or pelvic lymphadenopathy.  Cycle 10 Alimta/pembrolizumab 04/16/2019  Cycle 11 pembrolizumab 05/07/2019 (Alimta held secondary to increased tearing and leg edema)  Cycle 12 Alimta/pembrolizumab 05/28/2019  Cycle 13 Alimta/pembrolizumab 06/18/2019  Restaging CTs 07/08/2019-worsening of low-density hepatic lesions. Scattered small lymph nodes with increasing conspicuity, largest along the left periaortic chain  approximately 9 mm previously approximately 5 mm.  Cycle 14 Alimta/pembrolizumab 07/09/2019  Cycle 15 Alimta/pembrolizumab 07/31/2019  Cycle 16 Alimta/pembrolizumab 08/20/2019  Rising CEA beginning December 2020  CTs 09/08/2019-enlargement of liver metastases, mild enlargement of abdominal/retroperitoneal and lower thoracic nodes, nonspecific pulmonary nodules-stable  Cycle 1 gemcitabine 09/17/2019  Cycle 2 gemcitabine 10/01/2019  Cycle 3 gemcitabine 10/15/2019  Cycle 4 gemcitabine 10/29/2019  Cycle 5 gemcitabine 11/12/2019  Rising CEA 10/2019  CTs 11/19/19 mixed response in liver, stable pulmonary nodules, thoracic and RP nodes, overall stable  Plan to intensify chemo carboplatin AUC 4 plus gemcitabine given on day 1 q21 days starting ~12/03/19  Admission 11/30/2019 with recurrent biliary obstruction-ERCP 12/02/2019-obstructed biliary stent, new stent placed  Cycle 1 gemcitabine/carboplatin 12/24/2019  Cycle 2 gemcitabine/carboplatin 01/14/2020  Cycle 3 gemcitabine/carboplatin 02/04/2020  Cycle 4 gemcitabine/carboplatin 02/25/2020  CTs 03/15/2020-decrease in size of hepatic metastatic lesions. No new liver lesions. Newsclerotic lesions in the thoracic and lumbar spine. Stable small pulmonary nodules. Asymmetric wall thickening of the medial wall of the gallbladder new since previous exam without signs of inflammation.  Cycle5gemcitabine/carboplatin 03/17/2020  Cycle 6 gemcitabine/carboplatin 04/15/2020  Cycle 7 gemcitabine/carboplatin 05/09/2020  Cycle 8 gemcitabine/carboplatin 05/31/2020  CTs 06/16/2020-unchanged sclerotic bone lesions, increase in size and number of liver metastases  Cycle 1 docetaxel/ramucirumab 06/27/2020  Cycle 2 docetaxel/ramucirumab 07/18/2020  Cycle 3 docetaxel/ramucirumab 08/08/2020  Cycle 4 docetaxel/ramucirumab 08/29/2020  CT 09/16/2020-decreased size of liver lesions, stable bone metastases  Cycle 5 docetaxel/ramucirumab  09/19/2020 2. COPD 3. Hypertension 4. Trace edema right leg 12/11/2018. Doppler negative for DVT 12/12/2018. 5. Watery eyes, runny nose reported 12/11/2018. Question related to Alimta. Persistent watery eyes 04/16/2019. Ophthalmology evaluation 05/11/2019-blepharitis,ectropion left lower lid, macular degeneration 6. Admission 12/24/2018 with a fever-no source for infection identified, completed outpatient course of ciprofloxacin 7. Port-A-Cath placement 09/14/2019, interventional radiology 8. Colonoscopy 06/16/2015-diverticulosis in the sigmoid colon, examination otherwise normal     Disposition: ***  [name], MD  10/10/2020  9:56 AM

## 2020-10-10 NOTE — Telephone Encounter (Signed)
Appointments scheduled per 4/18 los. Patient given calendar w/  updates in infusion

## 2020-10-10 NOTE — Progress Notes (Signed)
[facility] OFFICE PROGRESS NOTE   Diagnosis: Non-small cell lung cancer  INTERVAL HISTORY:   Ricky Conway completed another treatment with Taxotere/ramucirumab on 09/19/2020.  No nausea/vomiting, bleeding, or neuropathy symptoms.  He has exertional dyspnea.  He continues to have pain in the right shoulder.  A plain x-ray of the right shoulder on 09/30/2020 was negative.  Objective:  Vital signs in last 24 hours:  Blood pressure (!) 160/80, pulse 70, temperature 97.8 F (36.6 C), temperature source Tympanic, resp. rate 18, height '5\' 11"'  (1.803 m), weight 186 lb (84.4 kg), SpO2 90 %.    HEENT: No thrush or ulcers Resp: Distant breath sounds, inspiratory rhonchi at the lower posterior chest bilaterally, no respiratory distress Cardio: Regular rate and rhythm GI: No hepatosplenomegaly, nontender Vascular: No leg edema   Portacath/PICC-without erythema  Lab Results:  Lab Results  Component Value Date   WBC 14.4 (H) 10/10/2020   HGB 15.1 10/10/2020   HCT 45.4 10/10/2020   MCV 98.9 10/10/2020   PLT 191 10/10/2020   NEUTROABS 11.6 (H) 10/10/2020    CMP  Lab Results  Component Value Date   NA 137 10/10/2020   K 4.1 10/10/2020   CL 99 10/10/2020   CO2 32 10/10/2020   GLUCOSE 105 (H) 10/10/2020   BUN 12 10/10/2020   CREATININE 0.52 (L) 10/10/2020   CALCIUM 8.8 (L) 10/10/2020   PROT 6.1 (L) 10/10/2020   ALBUMIN 3.5 10/10/2020   AST 22 10/10/2020   ALT 16 10/10/2020   ALKPHOS 81 10/10/2020   BILITOT 0.6 10/10/2020   GFRNONAA >60 10/10/2020   GFRAA >60 03/15/2020    Lab Results  Component Value Date   CEA1 4,580.99 (H) 09/19/2020     Medications: I have reviewed the patient's current medications.   Assessment/Plan: 1. Pancreas mass, liver lesions  Abdominal ultrasound 09/03/2018-3 liver lesions. Intra-and extrahepatic biliary ductal dilatation.  Presentation to the emergency department 09/08/2018 with painless jaundice. Initial bilirubin  returned at 18.3.   MRI abdomen 09/08/2018-1.9 x 2.6 cm hypoenhancing lesion arising exophytically along the posterior aspect of the pancreatic head suspicious for pancreatic adenocarcinoma. There was suspected involvement of the distal common duct with secondary intrahepatic/extrahepatic ductal dilatation. Lesion noted to abut the IVC. Multiple liver metastases noted. Small upper abdominal/retroperitoneal lymph nodes noted.   ERCP 09/09/2018-high-grade stricture and obstruction of the distal common bile duct. A metallic biliary stent was placed. Brushings in the mid and lower third of the main bile duct were obtained with no malignant cells identified.   Bilirubin improved at 4.7 on 09/10/2018.   Biopsy of a liver lesion on 09/11/2018-metastatic carcinoma, cytokeratin 7 and TTF-1 positive, negative for cytokeratin 20, CDX 2, and PSA. Foundation 1-microsatellite stable, tumor mutation burden 16, ERBB2 amplification  CT chest 09/22/2018-right hilar/mediastinal/retrocrural lymphadenopathy, ill-defined right lower lobe nodularity, emphysema  Cycle 1 carboplatin/Alimta/pembrolizumab 10/09/2018  Cycle 2 carboplatin/Alimta/pembrolizumab 10/30/2018  Cycle 3 carboplatin/Alimta/pembrolizumab 11/20/2018  Cycle 4 carboplatin/Alimta/pembrolizumab 12/11/2018  CT 12/26/2018-marked response to therapy of the radical abdominal adenopathy, decreased size of hepatic metastases, no new or progressive disease  Cycle 5 Alimta/pembrolizumab 01/01/2019  Cycle 6 Alimta/pembrolizumab 01/23/2019  Cycle 7 Alimta/pembrolizumab 02/12/2019  Cycle 8 Alimta/pembrolizumab 03/05/2019  Cycle 9 Alimta/pembrolizumab 03/26/2019  CTs 04/14/2019-slight interval decrease in size and conspicuity of multiple, irregular low-attenuation liver lesions. No new liver lesions. No recurrent mediastinal or hilar lymphadenopathy. No change in mildly prominent subcentimeter periaortic and peripheral lymph nodes without evidence of  recurrent abdominal or pelvic lymphadenopathy.  Cycle 10 Alimta/pembrolizumab  04/16/2019  Cycle 11 pembrolizumab 05/07/2019 (Alimta held secondary to increased tearing and leg edema)  Cycle 12 Alimta/pembrolizumab 05/28/2019  Cycle 13 Alimta/pembrolizumab 06/18/2019  Restaging CTs 07/08/2019-worsening of low-density hepatic lesions. Scattered small lymph nodes with increasing conspicuity, largest along the left periaortic chain approximately 9 mm previously approximately 5 mm.  Cycle 14 Alimta/pembrolizumab 07/09/2019  Cycle 15 Alimta/pembrolizumab 07/31/2019  Cycle 16 Alimta/pembrolizumab 08/20/2019  Rising CEA beginning December 2020  CTs 09/08/2019-enlargement of liver metastases, mild enlargement of abdominal/retroperitoneal and lower thoracic nodes, nonspecific pulmonary nodules-stable  Cycle 1 gemcitabine 09/17/2019  Cycle 2 gemcitabine 10/01/2019  Cycle 3 gemcitabine 10/15/2019  Cycle 4 gemcitabine 10/29/2019  Cycle 5 gemcitabine 11/12/2019  Rising CEA 10/2019  CTs 11/19/19 mixed response in liver, stable pulmonary nodules, thoracic and RP nodes, overall stable  Plan to intensify chemo carboplatin AUC 4 plus gemcitabine given on day 1 q21 days starting ~12/03/19  Admission 11/30/2019 with recurrent biliary obstruction-ERCP 12/02/2019-obstructed biliary stent, new stent placed  Cycle 1 gemcitabine/carboplatin 12/24/2019  Cycle 2 gemcitabine/carboplatin 01/14/2020  Cycle 3 gemcitabine/carboplatin 02/04/2020  Cycle 4 gemcitabine/carboplatin 02/25/2020  CTs 03/15/2020-decrease in size of hepatic metastatic lesions. No new liver lesions. Newsclerotic lesions in the thoracic and lumbar spine. Stable small pulmonary nodules. Asymmetric wall thickening of the medial wall of the gallbladder new since previous exam without signs of inflammation.  Cycle5gemcitabine/carboplatin 03/17/2020  Cycle 6 gemcitabine/carboplatin 04/15/2020  Cycle 7 gemcitabine/carboplatin 05/09/2020  Cycle 8  gemcitabine/carboplatin 05/31/2020  CTs 06/16/2020-unchanged sclerotic bone lesions, increase in size and number of liver metastases  Cycle 1 docetaxel/ramucirumab 06/27/2020  Cycle 2 docetaxel/ramucirumab 07/18/2020  Cycle 3 docetaxel/ramucirumab 08/08/2020  Cycle 4 docetaxel/ramucirumab 08/29/2020  CT 09/16/2020-decreased size of liver lesions, stable bone metastases  Cycle 5 docetaxel/ramucirumab 09/19/2020  Cycle 6 docetaxel/ramucirumab 10/10/2020 2. COPD 3. Hypertension 4. Trace edema right leg 12/11/2018. Doppler negative for DVT 12/12/2018. 5. Watery eyes, runny nose reported 12/11/2018. Question related to Alimta. Persistent watery eyes 04/16/2019. Ophthalmology evaluation 05/11/2019-blepharitis,ectropion left lower lid, macular degeneration 6. Admission 12/24/2018 with a fever-no source for infection identified, completed outpatient course of ciprofloxacin 7. Port-A-Cath placement 09/14/2019, interventional radiology 8. Colonoscopy 06/16/2015-diverticulosis in the sigmoid colon, examination otherwise normal      Disposition: Ricky Conway appears stable.  He will complete another cycle of docetaxel/ramucirumab today.  He will return for an office visit and chemotherapy in 3 weeks.  The CEA was lower 3 weeks ago.  We will repeat the CEA when he returns on 10/31/2020. [name], MD  10/10/2020  9:52 AM

## 2020-10-10 NOTE — Progress Notes (Signed)
Pt states he didn't take morning BP meds , hence why BP is up. Will do when he gets home.

## 2020-10-11 ENCOUNTER — Other Ambulatory Visit: Payer: Self-pay | Admitting: Oncology

## 2020-10-11 ENCOUNTER — Inpatient Hospital Stay: Payer: Medicare Other

## 2020-10-11 VITALS — BP 164/84 | HR 88 | Temp 97.6°F | Resp 18

## 2020-10-11 DIAGNOSIS — Z5112 Encounter for antineoplastic immunotherapy: Secondary | ICD-10-CM | POA: Diagnosis not present

## 2020-10-11 DIAGNOSIS — C3490 Malignant neoplasm of unspecified part of unspecified bronchus or lung: Secondary | ICD-10-CM

## 2020-10-11 MED ORDER — PEGFILGRASTIM-CBQV 6 MG/0.6ML ~~LOC~~ SOSY
6.0000 mg | PREFILLED_SYRINGE | Freq: Once | SUBCUTANEOUS | Status: AC
  Administered 2020-10-11: 6 mg via SUBCUTANEOUS
  Filled 2020-10-11: qty 0.6

## 2020-10-30 ENCOUNTER — Other Ambulatory Visit: Payer: Self-pay | Admitting: Oncology

## 2020-11-01 ENCOUNTER — Encounter: Payer: Self-pay | Admitting: Nurse Practitioner

## 2020-11-01 ENCOUNTER — Inpatient Hospital Stay: Payer: Medicare Other | Attending: Oncology

## 2020-11-01 ENCOUNTER — Other Ambulatory Visit: Payer: Self-pay

## 2020-11-01 ENCOUNTER — Inpatient Hospital Stay (HOSPITAL_BASED_OUTPATIENT_CLINIC_OR_DEPARTMENT_OTHER): Payer: Medicare Other | Admitting: Nurse Practitioner

## 2020-11-01 ENCOUNTER — Inpatient Hospital Stay: Payer: Medicare Other

## 2020-11-01 VITALS — BP 152/76 | HR 87 | Temp 97.8°F | Resp 20 | Ht 71.0 in | Wt 184.0 lb

## 2020-11-01 DIAGNOSIS — C787 Secondary malignant neoplasm of liver and intrahepatic bile duct: Secondary | ICD-10-CM | POA: Diagnosis present

## 2020-11-01 DIAGNOSIS — C3490 Malignant neoplasm of unspecified part of unspecified bronchus or lung: Secondary | ICD-10-CM

## 2020-11-01 DIAGNOSIS — Z5111 Encounter for antineoplastic chemotherapy: Secondary | ICD-10-CM | POA: Insufficient documentation

## 2020-11-01 DIAGNOSIS — Z79899 Other long term (current) drug therapy: Secondary | ICD-10-CM | POA: Diagnosis not present

## 2020-11-01 DIAGNOSIS — Z5112 Encounter for antineoplastic immunotherapy: Secondary | ICD-10-CM | POA: Insufficient documentation

## 2020-11-01 DIAGNOSIS — C3431 Malignant neoplasm of lower lobe, right bronchus or lung: Secondary | ICD-10-CM | POA: Insufficient documentation

## 2020-11-01 DIAGNOSIS — R5383 Other fatigue: Secondary | ICD-10-CM

## 2020-11-01 LAB — CMP (CANCER CENTER ONLY)
ALT: 18 U/L (ref 0–44)
AST: 24 U/L (ref 15–41)
Albumin: 3.5 g/dL (ref 3.5–5.0)
Alkaline Phosphatase: 68 U/L (ref 38–126)
Anion gap: 6 (ref 5–15)
BUN: 11 mg/dL (ref 8–23)
CO2: 30 mmol/L (ref 22–32)
Calcium: 8.4 mg/dL — ABNORMAL LOW (ref 8.9–10.3)
Chloride: 99 mmol/L (ref 98–111)
Creatinine: 0.5 mg/dL — ABNORMAL LOW (ref 0.61–1.24)
GFR, Estimated: >60 mL/min (ref >60)
Glucose, Bld: 99 mg/dL (ref 70–99)
Potassium: 4 mmol/L (ref 3.5–5.1)
Sodium: 135 mmol/L (ref 135–145)
Total Bilirubin: 0.6 mg/dL (ref 0.3–1.2)
Total Protein: 6.4 g/dL — ABNORMAL LOW (ref 6.5–8.1)

## 2020-11-01 LAB — CBC WITH DIFFERENTIAL (CANCER CENTER ONLY)
Abs Immature Granulocytes: 0.04 10*3/uL (ref 0.00–0.07)
Basophils Absolute: 0.1 10*3/uL (ref 0.0–0.1)
Basophils Relative: 1 %
Eosinophils Absolute: 0 10*3/uL (ref 0.0–0.5)
Eosinophils Relative: 0 %
HCT: 47.6 % (ref 39.0–52.0)
Hemoglobin: 15.9 g/dL (ref 13.0–17.0)
Immature Granulocytes: 0 %
Lymphocytes Relative: 8 %
Lymphs Abs: 1.1 10*3/uL (ref 0.7–4.0)
MCH: 33.1 pg (ref 26.0–34.0)
MCHC: 33.4 g/dL (ref 30.0–36.0)
MCV: 99 fL (ref 80.0–100.0)
Monocytes Absolute: 1.2 10*3/uL — ABNORMAL HIGH (ref 0.1–1.0)
Monocytes Relative: 8 %
Neutro Abs: 12.1 10*3/uL — ABNORMAL HIGH (ref 1.7–7.7)
Neutrophils Relative %: 83 %
Platelet Count: 206 10*3/uL (ref 150–400)
RBC: 4.81 MIL/uL (ref 4.22–5.81)
RDW: 16.2 % — ABNORMAL HIGH (ref 11.5–15.5)
WBC Count: 14.5 10*3/uL — ABNORMAL HIGH (ref 4.0–10.5)
nRBC: 0 % (ref 0.0–0.2)

## 2020-11-01 LAB — TOTAL PROTEIN, URINE DIPSTICK: Protein, ur: NEGATIVE mg/dL

## 2020-11-01 LAB — TSH: TSH: 4.395 u[IU]/mL (ref 0.350–4.500)

## 2020-11-01 LAB — CEA (IN HOUSE-CHCC): CEA (CHCC-In House): 3324.25 ng/mL — ABNORMAL HIGH (ref 0.00–5.00)

## 2020-11-01 LAB — CEA (ACCESS): CEA (CHCC): 4342.09 ng/mL — ABNORMAL HIGH (ref 0.00–5.00)

## 2020-11-01 MED ORDER — SODIUM CHLORIDE 0.9 % IV SOLN
10.0000 mg/kg | Freq: Once | INTRAVENOUS | Status: AC
  Administered 2020-11-01: 800 mg via INTRAVENOUS
  Filled 2020-11-01: qty 50

## 2020-11-01 MED ORDER — SODIUM CHLORIDE 0.9% FLUSH
10.0000 mL | INTRAVENOUS | Status: DC | PRN
  Administered 2020-11-01: 10 mL
  Filled 2020-11-01: qty 10

## 2020-11-01 MED ORDER — SODIUM CHLORIDE 0.9 % IV SOLN
10.0000 mg | Freq: Once | INTRAVENOUS | Status: AC
  Administered 2020-11-01: 10 mg via INTRAVENOUS
  Filled 2020-11-01: qty 10

## 2020-11-01 MED ORDER — DIPHENHYDRAMINE HCL 50 MG/ML IJ SOLN
INTRAMUSCULAR | Status: AC
  Filled 2020-11-01: qty 1

## 2020-11-01 MED ORDER — DIPHENHYDRAMINE HCL 50 MG/ML IJ SOLN
25.0000 mg | Freq: Once | INTRAMUSCULAR | Status: AC
Start: 2020-11-01 — End: 2020-11-01
  Administered 2020-11-01: 25 mg via INTRAVENOUS

## 2020-11-01 MED ORDER — SODIUM CHLORIDE 0.9 % IV SOLN
60.0000 mg/m2 | Freq: Once | INTRAVENOUS | Status: AC
  Administered 2020-11-01: 120 mg via INTRAVENOUS
  Filled 2020-11-01: qty 12

## 2020-11-01 MED ORDER — ACETAMINOPHEN 325 MG PO TABS
650.0000 mg | ORAL_TABLET | Freq: Once | ORAL | Status: AC
  Administered 2020-11-01: 650 mg via ORAL
  Filled 2020-11-01: qty 2

## 2020-11-01 MED ORDER — SODIUM CHLORIDE 0.9 % IV SOLN
Freq: Once | INTRAVENOUS | Status: AC
  Filled 2020-11-01: qty 250

## 2020-11-01 MED ORDER — HEPARIN SOD (PORK) LOCK FLUSH 100 UNIT/ML IV SOLN
500.0000 [IU] | Freq: Once | INTRAVENOUS | Status: AC | PRN
  Administered 2020-11-01: 500 [IU]
  Filled 2020-11-01: qty 5

## 2020-11-01 NOTE — Progress Notes (Signed)
[facility] OFFICE PROGRESS NOTE   Diagnosis: Non-small cell lung cancer  INTERVAL HISTORY:   Ricky Conway returns as scheduled.  He completed cycle 6 docetaxel/ramucirumab 10/10/2020.  He denies nausea/vomiting.  No mouth sores.  No diarrhea.  No rash.  He has periodic leg edema.  No calf pain.  He notes a mucousy eye discharge bilaterally.  Decrease in energy level for about 7 to 10 days after each treatment.  Objective:  Vital signs in last 24 hours:  Blood pressure (!) 152/76, pulse 87, temperature 97.8 F (36.6 C), temperature source Oral, resp. rate 20, height '5\' 11"'  (1.803 m), weight 184 lb (83.5 kg), SpO2 99 %.    HEENT: No thrush or ulcers.  Mucousy appearing discharge medial canthus bilaterally Resp: Distant breath sounds.  No respiratory distress. Cardio: Regular rate and rhythm. GI: Abdomen soft and nontender.  No hepatosplenomegaly. Vascular: No leg edema. Port-A-Cath without erythema.  Lab Results:  Lab Results  Component Value Date   WBC 14.5 (H) 11/01/2020   HGB 15.9 11/01/2020   HCT 47.6 11/01/2020   MCV 99.0 11/01/2020   PLT 206 11/01/2020   NEUTROABS 12.1 (H) 11/01/2020    Imaging:  No results found.  Medications: I have reviewed the patient's current medications.  Assessment/Plan: 1. Pancreas mass, liver lesions  Abdominal ultrasound 09/03/2018-3 liver lesions. Intra-and extrahepatic biliary ductal dilatation.  Presentation to the emergency department 09/08/2018 with painless jaundice. Initial bilirubin returned at 18.3.   MRI abdomen 09/08/2018-1.9 x 2.6 cm hypoenhancing lesion arising exophytically along the posterior aspect of the pancreatic head suspicious for pancreatic adenocarcinoma. There was suspected involvement of the distal common duct with secondary intrahepatic/extrahepatic ductal dilatation. Lesion noted to abut the IVC. Multiple liver metastases noted. Small upper abdominal/retroperitoneal lymph nodes noted.    ERCP 09/09/2018-high-grade stricture and obstruction of the distal common bile duct. A metallic biliary stent was placed. Brushings in the mid and lower third of the main bile duct were obtained with no malignant cells identified.   Bilirubin improved at 4.7 on 09/10/2018.   Biopsy of a liver lesion on 09/11/2018-metastatic carcinoma, cytokeratin 7 and TTF-1 positive, negative for cytokeratin 20, CDX 2, and PSA. Foundation 1-microsatellite stable, tumor mutation burden 16, ERBB2 amplification  CT chest 09/22/2018-right hilar/mediastinal/retrocrural lymphadenopathy, ill-defined right lower lobe nodularity, emphysema  Cycle 1 carboplatin/Alimta/pembrolizumab 10/09/2018  Cycle 2 carboplatin/Alimta/pembrolizumab 10/30/2018  Cycle 3 carboplatin/Alimta/pembrolizumab 11/20/2018  Cycle 4 carboplatin/Alimta/pembrolizumab 12/11/2018  CT 12/26/2018-marked response to therapy of the radical abdominal adenopathy, decreased size of hepatic metastases, no new or progressive disease  Cycle 5 Alimta/pembrolizumab 01/01/2019  Cycle 6 Alimta/pembrolizumab 01/23/2019  Cycle 7 Alimta/pembrolizumab 02/12/2019  Cycle 8 Alimta/pembrolizumab 03/05/2019  Cycle 9 Alimta/pembrolizumab 03/26/2019  CTs 04/14/2019-slight interval decrease in size and conspicuity of multiple, irregular low-attenuation liver lesions. No new liver lesions. No recurrent mediastinal or hilar lymphadenopathy. No change in mildly prominent subcentimeter periaortic and peripheral lymph nodes without evidence of recurrent abdominal or pelvic lymphadenopathy.  Cycle 10 Alimta/pembrolizumab 04/16/2019  Cycle 11 pembrolizumab 05/07/2019 (Alimta held secondary to increased tearing and leg edema)  Cycle 12 Alimta/pembrolizumab 05/28/2019  Cycle 13 Alimta/pembrolizumab 06/18/2019  Restaging CTs 07/08/2019-worsening of low-density hepatic lesions. Scattered small lymph nodes with increasing conspicuity, largest along the left periaortic  chain approximately 9 mm previously approximately 5 mm.  Cycle 14 Alimta/pembrolizumab 07/09/2019  Cycle 15 Alimta/pembrolizumab 07/31/2019  Cycle 16 Alimta/pembrolizumab 08/20/2019  Rising CEA beginning December 2020  CTs 09/08/2019-enlargement of liver metastases, mild enlargement of abdominal/retroperitoneal and lower thoracic nodes,  nonspecific pulmonary nodules-stable  Cycle 1 gemcitabine 09/17/2019  Cycle 2 gemcitabine 10/01/2019  Cycle 3 gemcitabine 10/15/2019  Cycle 4 gemcitabine 10/29/2019  Cycle 5 gemcitabine 11/12/2019  Rising CEA 10/2019  CTs 11/19/19 mixed response in liver, stable pulmonary nodules, thoracic and RP nodes, overall stable  Plan to intensify chemo carboplatin AUC 4 plus gemcitabine given on day 1 q21 days starting ~12/03/19  Admission 11/30/2019 with recurrent biliary obstruction-ERCP 12/02/2019-obstructed biliary stent, new stent placed  Cycle 1 gemcitabine/carboplatin 12/24/2019  Cycle 2 gemcitabine/carboplatin 01/14/2020  Cycle 3 gemcitabine/carboplatin 02/04/2020  Cycle 4 gemcitabine/carboplatin 02/25/2020  CTs 03/15/2020-decrease in size of hepatic metastatic lesions. No new liver lesions. Newsclerotic lesions in the thoracic and lumbar spine. Stable small pulmonary nodules. Asymmetric wall thickening of the medial wall of the gallbladder new since previous exam without signs of inflammation.  Cycle5gemcitabine/carboplatin 03/17/2020  Cycle 6 gemcitabine/carboplatin 04/15/2020  Cycle 7 gemcitabine/carboplatin 05/09/2020  Cycle 8 gemcitabine/carboplatin 05/31/2020  CTs 06/16/2020-unchanged sclerotic bone lesions, increase in size and number of liver metastases  Cycle 1 docetaxel/ramucirumab 06/27/2020  Cycle 2 docetaxel/ramucirumab 07/18/2020  Cycle 3 docetaxel/ramucirumab 08/08/2020  Cycle 4 docetaxel/ramucirumab 08/29/2020  CT 09/16/2020-decreased size of liver lesions, stable bone metastases  Cycle 5 docetaxel/ramucirumab 09/19/2020  Cycle 6  docetaxel/ramucirumab 10/10/2020  Cycle 7 docetaxel/ramucirumab 11/01/2020 2. COPD 3. Hypertension 4. Trace edema right leg 12/11/2018. Doppler negative for DVT 12/12/2018. 5. Watery eyes, runny nose reported 12/11/2018. Question related to Alimta. Persistent watery eyes 04/16/2019. Ophthalmology evaluation 05/11/2019-blepharitis,ectropion left lower lid, macular degeneration 6. Admission 12/24/2018 with a fever-no source for infection identified, completed outpatient course of ciprofloxacin 7. Port-A-Cath placement 09/14/2019, interventional radiology 8. Colonoscopy 06/16/2015-diverticulosis in the sigmoid colon, examination otherwise normal    Disposition: Ricky Conway appears stable.  He has completed 6 cycles of docetaxel/ramucirumab.  There is no clinical evidence of disease progression.  Plan to proceed with cycle 7 today as scheduled.  We reviewed the CBC and chemistry panel from today.  Labs adequate to proceed as above.  He will follow-up with ophthalmology regarding the bilateral eye discharge.  We discussed the possibility this could be related to docetaxel.  He will return for lab, follow-up, cycle 8 docetaxel/ramucirumab in 3 weeks.  He will contact the office in the interim with any problems.    Ned Card ANP/GNP-BC   11/01/2020  10:27 AM

## 2020-11-01 NOTE — Patient Instructions (Addendum)
[facility]  Discharge Instructions: Thank you for choosing [facility] to provide your oncology and hematology care.   If you have a lab appointment with the [facility], please go directly to the [facility] and check in at the registration area.   Wear comfortable clothing and clothing appropriate for easy access to any Portacath or PICC line.   We strive to give you quality time with your provider. You may need to reschedule your appointment if you arrive late (15 or more minutes).  Arriving late affects you and other patients whose appointments are after yours.  Also, if you miss three or more appointments without notifying the office, you may be dismissed from the clinic at the provider's discretion.      For prescription refill requests, have your pharmacy contact our office and allow 72 hours for refills to be completed.    Today you received the following chemotherapy and/or immunotherapy agents Cyramza and Taxotere   To help prevent nausea and vomiting after your treatment, we encourage you to take your nausea medication as directed.  BELOW ARE SYMPTOMS THAT SHOULD BE REPORTED IMMEDIATELY: . *FEVER GREATER THAN 100.4 F (38 C) OR HIGHER . *CHILLS OR SWEATING . *NAUSEA AND VOMITING THAT IS NOT CONTROLLED WITH YOUR NAUSEA MEDICATION . *UNUSUAL SHORTNESS OF BREATH . *UNUSUAL BRUISING OR BLEEDING . *URINARY PROBLEMS (pain or burning when urinating, or frequent urination) . *BOWEL PROBLEMS (unusual diarrhea, constipation, pain near the anus) . TENDERNESS IN MOUTH AND THROAT WITH OR WITHOUT PRESENCE OF ULCERS (sore throat, sores in mouth, or a toothache) . UNUSUAL RASH, SWELLING OR PAIN  . UNUSUAL VAGINAL DISCHARGE OR ITCHING   Items with * indicate a potential emergency and should be followed up as soon as possible or go to the Emergency Department if any problems should occur.  Please show the CHEMOTHERAPY ALERT CARD or IMMUNOTHERAPY  ALERT CARD at check-in to the Emergency Department and triage nurse.  Should you have questions after your visit or need to cancel or reschedule your appointment, please contact [facility]  Dept: [phone]  and follow the prompts.  Office hours are 8:00 a.m. to 4:30 p.m. Monday - Friday. Please note that voicemails left after 4:00 p.m. may not be returned until the following business day.  We are closed weekends and major holidays. You have access to a nurse at all times for urgent questions. Please call the main number to the clinic Dept: [phone] and follow the prompts.   For any non-urgent questions, you may also contact your provider using MyChart. We now offer e-Visits for anyone 63 and older to request care online for non-urgent symptoms. For details visit [email or web address].   Also download the MyChart app! Go to the app store, search "MyChart", open the app, select [facility], and log in with your MyChart username and password.  Due to Covid, a mask is required upon entering the hospital/clinic. If you do not have a mask, one will be given to you upon arrival. For doctor visits, patients may have 1 support person aged 73 or older with them. For treatment visits, patients cannot have anyone with them due to current Covid guidelines and our immunocompromised population.   Ramucirumab injection What is this medicine? RAMUCIRUMAB (ra mue SIR ue mab) is a monoclonal antibody. It is used to treat stomach cancer, colorectal cancer, liver cancer, and lung cancer. This medicine may be used for other purposes; ask your  health care provider or pharmacist if you have questions. COMMON BRAND NAME(S): Cyramza What should I tell my health care provider before I take this medicine? They need to know if you have any of these conditions:  bleeding disorders  blood clots  heart disease, including heart failure, heart attack, or chest pain (angina)  high blood  pressure  infection (especially a virus infection such as chickenpox, cold sores, or herpes)  protein in your urine  recent or planning to have surgery  stroke  an unusual or allergic reaction to ramucirumab, other medicines, foods, dyes, or preservatives  pregnant or trying to get pregnant  breast-feeding How should I use this medicine? This medicine is for infusion into a vein. It is given by a health care professional in a hospital or clinic setting. Talk to your pediatrician regarding the use of this medicine in children. Special care may be needed. Overdosage: If you think you have taken too much of this medicine contact a poison control center or emergency room at once. NOTE: This medicine is only for you. Do not share this medicine with others. What if I miss a dose? It is important not to miss your dose. Call your doctor or health care professional if you are unable to keep an appointment. What may interact with this medicine? Interactions have not been studied. This list may not describe all possible interactions. Give your health care provider a list of all the medicines, herbs, non-prescription drugs, or dietary supplements you use. Also tell them if you smoke, drink alcohol, or use illegal drugs. Some items may interact with your medicine. What should I watch for while using this medicine? Your condition will be monitored carefully while you are receiving this medicine. You will need to to check your blood pressure and have your blood and urine tested while you are taking this medicine. Your condition will be monitored carefully while you are receiving this medicine. This medicine may increase your risk to bruise or bleed. Call your doctor or health care professional if you notice any unusual bleeding. Before having surgery, talk to your health care provider to make sure it is ok. This drug can increase the risk of poor healing of your surgical site or wound. You will need to  stop this drug for 28 days before surgery. After surgery, wait at least 2 weeks before restarting this drug. Make sure the surgical site or wound is healed enough before restarting this drug. Talk to your health care provider if questions. Do not become pregnant while taking this medicine or for 3 months after stopping it. Women should inform their doctor if they wish to become pregnant or think they might be pregnant. There is a potential for serious side effects to an unborn child. Talk to your health care professional or pharmacist for more information. Do not breast-feed an infant while taking this medicine or for 2 months after stopping it. This medicine may interfere with the ability to have a child. Talk with your doctor or health care professional if you are concerned about your fertility. What side effects may I notice from receiving this medicine? Side effects that you should report to your doctor or health care professional as soon as possible:  allergic reactions like skin rash, itching or hives, breathing problems, swelling of the face, lips, or tongue  signs of infection - fever or chills, cough, sore throat  chest pain or chest tightness  confusion  dizziness  feeling faint or lightheaded, falls  severe abdominal pain  severe nausea, vomiting  signs and symptoms of bleeding such as bloody or black, tarry stools; red or dark-brown urine; spitting up blood or brown material that looks like coffee grounds; red spots on the skin; unusual bruising or bleeding from the eye, gums, or nose  signs and symptoms of a blood clot such as breathing problems; changes in vision; chest pain; severe, sudden headache; pain, swelling, warmth in the leg; trouble speaking; sudden numbness or weakness of the face, arm or leg  symptoms of a stroke: change in mental awareness, inability to talk or move one side of the body  trouble walking, dizziness, loss of balance or coordination Side effects  that usually do not require medical attention (report to your doctor or health care professional if they continue or are bothersome):  cold, clammy skin  constipation  diarrhea  headache  nausea, vomiting  stomach pain  unusually slow heartbeat  unusually weak or tired This list may not describe all possible side effects. Call your doctor for medical advice about side effects. You may report side effects to FDA at 1-800-FDA-1088. Where should I keep my medicine? This drug is given in a hospital or clinic and will not be stored at home. NOTE: This sheet is a summary. It may not cover all possible information. If you have questions about this medicine, talk to your doctor, pharmacist, or health care provider.  2021 Elsevier/Gold Standard (2019-04-08 11:17:50)   Docetaxel injection What is this medicine? DOCETAXEL (doe se TAX el) is a chemotherapy drug. It targets fast dividing cells, like cancer cells, and causes these cells to die. This medicine is used to treat many types of cancers like breast cancer, certain stomach cancers, head and neck cancer, lung cancer, and prostate cancer. This medicine may be used for other purposes; ask your health care provider or pharmacist if you have questions. COMMON BRAND NAME(S): Docefrez, Taxotere What should I tell my health care provider before I take this medicine? They need to know if you have any of these conditions:  infection (especially a virus infection such as chickenpox, cold sores, or herpes)  liver disease  low blood counts, like low white cell, platelet, or red cell counts  an unusual or allergic reaction to docetaxel, polysorbate 80, other chemotherapy agents, other medicines, foods, dyes, or preservatives  pregnant or trying to get pregnant  breast-feeding How should I use this medicine? This drug is given as an infusion into a vein. It is administered in a hospital or clinic by a specially trained health care  professional. Talk to your pediatrician regarding the use of this medicine in children. Special care may be needed. Overdosage: If you think you have taken too much of this medicine contact a poison control center or emergency room at once. NOTE: This medicine is only for you. Do not share this medicine with others. What if I miss a dose? It is important not to miss your dose. Call your doctor or health care professional if you are unable to keep an appointment. What may interact with this medicine? Do not take this medicine with any of the following medications:  live virus vaccines This medicine may also interact with the following medications:  aprepitant  certain antibiotics like erythromycin or clarithromycin  certain antivirals for HIV or hepatitis  certain medicines for fungal infections like fluconazole, itraconazole, ketoconazole, posaconazole, or voriconazole  cimetidine  ciprofloxacin  conivaptan  cyclosporine  dronedarone  fluvoxamine  grapefruit juice  imatinib  verapamil This list may not describe all possible interactions. Give your health care provider a list of all the medicines, herbs, non-prescription drugs, or dietary supplements you use. Also tell them if you smoke, drink alcohol, or use illegal drugs. Some items may interact with your medicine. What should I watch for while using this medicine? Your condition will be monitored carefully while you are receiving this medicine. You will need important blood work done while you are taking this medicine. Call your doctor or health care professional for advice if you get a fever, chills or sore throat, or other symptoms of a cold or flu. Do not treat yourself. This drug decreases your body's ability to fight infections. Try to avoid being around people who are sick. Some products may contain alcohol. Ask your health care professional if this medicine contains alcohol. Be sure to tell all health care  professionals you are taking this medicine. Certain medicines, like metronidazole and disulfiram, can cause an unpleasant reaction when taken with alcohol. The reaction includes flushing, headache, nausea, vomiting, sweating, and increased thirst. The reaction can last from 30 minutes to several hours. You may get drowsy or dizzy. Do not drive, use machinery, or do anything that needs mental alertness until you know how this medicine affects you. Do not stand or sit up quickly, especially if you are an older patient. This reduces the risk of dizzy or fainting spells. Alcohol may interfere with the effect of this medicine. Talk to your health care professional about your risk of cancer. You may be more at risk for certain types of cancer if you take this medicine. Do not become pregnant while taking this medicine or for 6 months after stopping it. Women should inform their doctor if they wish to become pregnant or think they might be pregnant. There is a potential for serious side effects to an unborn child. Talk to your health care professional or pharmacist for more information. Do not breast-feed an infant while taking this medicine or for 1 week after stopping it. Males who get this medicine must use a condom during sex with females who can get pregnant. If you get a woman pregnant, the baby could have birth defects. The baby could die before they are born. You will need to continue wearing a condom for 3 months after stopping the medicine. Tell your health care provider right away if your partner becomes pregnant while you are taking this medicine. This may interfere with the ability to father a child. You should talk to your doctor or health care professional if you are concerned about your fertility. What side effects may I notice from receiving this medicine? Side effects that you should report to your doctor or health care professional as soon as possible:  allergic reactions like skin rash, itching  or hives, swelling of the face, lips, or tongue  blurred vision  breathing problems  changes in vision  low blood counts - This drug may decrease the number of white blood cells, red blood cells and platelets. You may be at increased risk for infections and bleeding.  nausea and vomiting  pain, redness or irritation at site where injected  pain, tingling, numbness in the hands or feet  redness, blistering, peeling, or loosening of the skin, including inside the mouth  signs of decreased platelets or bleeding - bruising, pinpoint red spots on the skin, black, tarry stools, nosebleeds  signs of decreased red blood cells - unusually weak or tired, fainting spells, lightheadedness  signs of infection - fever or chills, cough, sore throat, pain or difficulty passing urine  swelling of the ankle, feet, hands Side effects that usually do not require medical attention (report to your doctor or health care professional if they continue or are bothersome):  constipation  diarrhea  fingernail or toenail changes  hair loss  loss of appetite  mouth sores  muscle pain This list may not describe all possible side effects. Call your doctor for medical advice about side effects. You may report side effects to FDA at 1-800-FDA-1088. Where should I keep my medicine? This drug is given in a hospital or clinic and will not be stored at home. NOTE: This sheet is a summary. It may not cover all possible information. If you have questions about this medicine, talk to your doctor, pharmacist, or health care provider.  2021 Elsevier/Gold Standard (2019-05-11 19:50:31)

## 2020-11-03 ENCOUNTER — Other Ambulatory Visit: Payer: Self-pay

## 2020-11-03 ENCOUNTER — Inpatient Hospital Stay: Payer: Medicare Other

## 2020-11-03 VITALS — BP 132/76 | HR 93 | Temp 98.7°F | Resp 20

## 2020-11-03 DIAGNOSIS — C3490 Malignant neoplasm of unspecified part of unspecified bronchus or lung: Secondary | ICD-10-CM

## 2020-11-03 DIAGNOSIS — Z5111 Encounter for antineoplastic chemotherapy: Secondary | ICD-10-CM | POA: Diagnosis not present

## 2020-11-03 MED ORDER — PEGFILGRASTIM-CBQV 6 MG/0.6ML ~~LOC~~ SOSY
6.0000 mg | PREFILLED_SYRINGE | Freq: Once | SUBCUTANEOUS | Status: AC
  Administered 2020-11-03: 6 mg via SUBCUTANEOUS

## 2020-11-03 NOTE — Patient Instructions (Signed)
Pegfilgrastim injection What is this medicine? PEGFILGRASTIM (PEG fil gra stim) is a long-acting granulocyte colony-stimulating factor that stimulates the growth of neutrophils, a type of white blood cell important in the body's fight against infection. It is used to reduce the incidence of fever and infection in patients with certain types of cancer who are receiving chemotherapy that affects the bone marrow, and to increase survival after being exposed to high doses of radiation. This medicine may be used for other purposes; ask your health care provider or pharmacist if you have questions. COMMON BRAND NAME(S): Fulphila, Neulasta, Nyvepria, UDENYCA, Ziextenzo What should I tell my health care provider before I take this medicine? They need to know if you have any of these conditions:  kidney disease  latex allergy  ongoing radiation therapy  sickle cell disease  skin reactions to acrylic adhesives (On-Body Injector only)  an unusual or allergic reaction to pegfilgrastim, filgrastim, other medicines, foods, dyes, or preservatives  pregnant or trying to get pregnant  breast-feeding How should I use this medicine? This medicine is for injection under the skin. If you get this medicine at home, you will be taught how to prepare and give the pre-filled syringe or how to use the On-body Injector. Refer to the patient Instructions for Use for detailed instructions. Use exactly as directed. Tell your healthcare provider immediately if you suspect that the On-body Injector may not have performed as intended or if you suspect the use of the On-body Injector resulted in a missed or partial dose. It is important that you put your used needles and syringes in a special sharps container. Do not put them in a trash can. If you do not have a sharps container, call your pharmacist or healthcare provider to get one. Talk to your pediatrician regarding the use of this medicine in children. While this drug  may be prescribed for selected conditions, precautions do apply. Overdosage: If you think you have taken too much of this medicine contact a poison control center or emergency room at once. NOTE: This medicine is only for you. Do not share this medicine with others. What if I miss a dose? It is important not to miss your dose. Call your doctor or health care professional if you miss your dose. If you miss a dose due to an On-body Injector failure or leakage, a new dose should be administered as soon as possible using a single prefilled syringe for manual use. What may interact with this medicine? Interactions have not been studied. This list may not describe all possible interactions. Give your health care provider a list of all the medicines, herbs, non-prescription drugs, or dietary supplements you use. Also tell them if you smoke, drink alcohol, or use illegal drugs. Some items may interact with your medicine. What should I watch for while using this medicine? Your condition will be monitored carefully while you are receiving this medicine. You may need blood work done while you are taking this medicine. Talk to your health care provider about your risk of cancer. You may be more at risk for certain types of cancer if you take this medicine. If you are going to need a MRI, CT scan, or other procedure, tell your doctor that you are using this medicine (On-Body Injector only). What side effects may I notice from receiving this medicine? Side effects that you should report to your doctor or health care professional as soon as possible:  allergic reactions (skin rash, itching or hives, swelling of   the face, lips, or tongue)  back pain  dizziness  fever  pain, redness, or irritation at site where injected  pinpoint red spots on the skin  red or dark-brown urine  shortness of breath or breathing problems  stomach or side pain, or pain at the shoulder  swelling  tiredness  trouble  passing urine or change in the amount of urine  unusual bruising or bleeding Side effects that usually do not require medical attention (report to your doctor or health care professional if they continue or are bothersome):  bone pain  muscle pain This list may not describe all possible side effects. Call your doctor for medical advice about side effects. You may report side effects to FDA at 1-800-FDA-1088. Where should I keep my medicine? Keep out of the reach of children. If you are using this medicine at home, you will be instructed on how to store it. Throw away any unused medicine after the expiration date on the label. NOTE: This sheet is a summary. It may not cover all possible information. If you have questions about this medicine, talk to your doctor, pharmacist, or health care provider.  2021 Elsevier/Gold Standard (2019-07-03 13:20:51)  

## 2020-11-20 ENCOUNTER — Other Ambulatory Visit: Payer: Self-pay | Admitting: Oncology

## 2020-11-22 ENCOUNTER — Other Ambulatory Visit: Payer: Self-pay

## 2020-11-22 ENCOUNTER — Inpatient Hospital Stay: Payer: Medicare Other

## 2020-11-22 ENCOUNTER — Inpatient Hospital Stay (HOSPITAL_BASED_OUTPATIENT_CLINIC_OR_DEPARTMENT_OTHER): Payer: Medicare Other | Admitting: Oncology

## 2020-11-22 VITALS — BP 136/77 | HR 92 | Temp 98.0°F | Resp 17 | Ht 71.0 in | Wt 181.2 lb

## 2020-11-22 DIAGNOSIS — C3490 Malignant neoplasm of unspecified part of unspecified bronchus or lung: Secondary | ICD-10-CM

## 2020-11-22 DIAGNOSIS — Z5111 Encounter for antineoplastic chemotherapy: Secondary | ICD-10-CM | POA: Diagnosis not present

## 2020-11-22 LAB — CBC WITH DIFFERENTIAL (CANCER CENTER ONLY)
Abs Immature Granulocytes: 0.05 10*3/uL (ref 0.00–0.07)
Basophils Absolute: 0.1 10*3/uL (ref 0.0–0.1)
Basophils Relative: 1 %
Eosinophils Absolute: 0 10*3/uL (ref 0.0–0.5)
Eosinophils Relative: 0 %
HCT: 47.2 % (ref 39.0–52.0)
Hemoglobin: 15.9 g/dL (ref 13.0–17.0)
Immature Granulocytes: 0 %
Lymphocytes Relative: 9 %
Lymphs Abs: 1.3 10*3/uL (ref 0.7–4.0)
MCH: 33 pg (ref 26.0–34.0)
MCHC: 33.7 g/dL (ref 30.0–36.0)
MCV: 97.9 fL (ref 80.0–100.0)
Monocytes Absolute: 1.3 10*3/uL — ABNORMAL HIGH (ref 0.1–1.0)
Monocytes Relative: 9 %
Neutro Abs: 11.9 10*3/uL — ABNORMAL HIGH (ref 1.7–7.7)
Neutrophils Relative %: 81 %
Platelet Count: 211 10*3/uL (ref 150–400)
RBC: 4.82 MIL/uL (ref 4.22–5.81)
RDW: 15.9 % — ABNORMAL HIGH (ref 11.5–15.5)
WBC Count: 14.6 10*3/uL — ABNORMAL HIGH (ref 4.0–10.5)
nRBC: 0 % (ref 0.0–0.2)

## 2020-11-22 LAB — CMP (CANCER CENTER ONLY)
ALT: 17 U/L (ref 0–44)
AST: 23 U/L (ref 15–41)
Albumin: 3.5 g/dL (ref 3.5–5.0)
Alkaline Phosphatase: 80 U/L (ref 38–126)
Anion gap: 7 (ref 5–15)
BUN: 11 mg/dL (ref 8–23)
CO2: 32 mmol/L (ref 22–32)
Calcium: 8.5 mg/dL — ABNORMAL LOW (ref 8.9–10.3)
Chloride: 95 mmol/L — ABNORMAL LOW (ref 98–111)
Creatinine: 0.52 mg/dL — ABNORMAL LOW (ref 0.61–1.24)
GFR, Estimated: >60 mL/min (ref >60)
Glucose, Bld: 96 mg/dL (ref 70–99)
Potassium: 4 mmol/L (ref 3.5–5.1)
Sodium: 134 mmol/L — ABNORMAL LOW (ref 135–145)
Total Bilirubin: 0.7 mg/dL (ref 0.3–1.2)
Total Protein: 5.8 g/dL — ABNORMAL LOW (ref 6.5–8.1)

## 2020-11-22 LAB — CEA (IN HOUSE-CHCC): CEA (CHCC-In House): 3072.5 ng/mL — ABNORMAL HIGH (ref 0.00–5.00)

## 2020-11-22 LAB — CEA (ACCESS): CEA (CHCC): 4254.32 ng/mL — ABNORMAL HIGH (ref 0.00–5.00)

## 2020-11-22 MED ORDER — DIPHENHYDRAMINE HCL 50 MG/ML IJ SOLN
25.0000 mg | Freq: Once | INTRAMUSCULAR | Status: AC
  Administered 2020-11-22: 25 mg via INTRAVENOUS
  Filled 2020-11-22: qty 1

## 2020-11-22 MED ORDER — ACETAMINOPHEN 325 MG PO TABS
650.0000 mg | ORAL_TABLET | Freq: Once | ORAL | Status: AC
  Administered 2020-11-22: 650 mg via ORAL
  Filled 2020-11-22: qty 2

## 2020-11-22 MED ORDER — SODIUM CHLORIDE 0.9 % IV SOLN
10.0000 mg/kg | Freq: Once | INTRAVENOUS | Status: AC
  Administered 2020-11-22: 800 mg via INTRAVENOUS
  Filled 2020-11-22: qty 50

## 2020-11-22 MED ORDER — SODIUM CHLORIDE 0.9 % IV SOLN
60.0000 mg/m2 | Freq: Once | INTRAVENOUS | Status: AC
  Administered 2020-11-22: 120 mg via INTRAVENOUS
  Filled 2020-11-22: qty 12

## 2020-11-22 MED ORDER — SODIUM CHLORIDE 0.9% FLUSH
10.0000 mL | INTRAVENOUS | Status: DC | PRN
  Administered 2020-11-22: 10 mL
  Filled 2020-11-22: qty 10

## 2020-11-22 MED ORDER — SODIUM CHLORIDE 0.9 % IV SOLN
10.0000 mg | Freq: Once | INTRAVENOUS | Status: AC
  Administered 2020-11-22: 10 mg via INTRAVENOUS
  Filled 2020-11-22: qty 1

## 2020-11-22 MED ORDER — SODIUM CHLORIDE 0.9 % IV SOLN
Freq: Once | INTRAVENOUS | Status: AC
  Filled 2020-11-22: qty 250

## 2020-11-22 MED ORDER — HEPARIN SOD (PORK) LOCK FLUSH 100 UNIT/ML IV SOLN
500.0000 [IU] | Freq: Once | INTRAVENOUS | Status: AC | PRN
  Administered 2020-11-22: 500 [IU]
  Filled 2020-11-22: qty 5

## 2020-11-22 NOTE — Progress Notes (Signed)
[facility] OFFICE PROGRESS NOTE   Diagnosis: Non-small cell lung cancer  INTERVAL HISTORY:   Ricky Conway returns as scheduled.  He completed another cycle of docetaxel/ramucirumab on 11/01/2020.  No nausea/vomiting, mouth sores, bleeding, or neuropathy symptoms.  He has noted an increase in exertional dyspnea.  Objective:  Vital signs in last 24 hours:  Blood pressure 136/77, pulse 92, temperature 98 F (36.7 C), resp. rate 17, height '5\' 11"'  (1.803 m), weight 181 lb 3.2 oz (82.2 kg), SpO2 92 %.    HEENT: No thrush or ulcers Resp: Distant breath sounds, no respiratory distress Cardio: Regular rate and rhythm GI: No hepatosplenomegaly, nontender Vascular: No leg edema    Portacath/PICC-without erythema  Lab Results:  Lab Results  Component Value Date   WBC 14.6 (H) 11/22/2020   HGB 15.9 11/22/2020   HCT 47.2 11/22/2020   MCV 97.9 11/22/2020   PLT 211 11/22/2020   NEUTROABS 11.9 (H) 11/22/2020    CMP  Lab Results  Component Value Date   NA 135 11/01/2020   K 4.0 11/01/2020   CL 99 11/01/2020   CO2 30 11/01/2020   GLUCOSE 99 11/01/2020   BUN 11 11/01/2020   CREATININE 0.50 (L) 11/01/2020   CALCIUM 8.4 (L) 11/01/2020   PROT 6.4 (L) 11/01/2020   ALBUMIN 3.5 11/01/2020   AST 24 11/01/2020   ALT 18 11/01/2020   ALKPHOS 68 11/01/2020   BILITOT 0.6 11/01/2020   GFRNONAA >60 11/01/2020   GFRAA >60 03/15/2020    Lab Results  Component Value Date   CEA1 3,324.25 (H) 11/01/2020     Medications: I have reviewed the patient's current medications.   Assessment/Plan: 1. Pancreas mass, liver lesions  Abdominal ultrasound 09/03/2018-3 liver lesions. Intra-and extrahepatic biliary ductal dilatation.  Presentation to the emergency department 09/08/2018 with painless jaundice. Initial bilirubin returned at 18.3.   MRI abdomen 09/08/2018-1.9 x 2.6 cm hypoenhancing lesion arising exophytically along the posterior aspect of the pancreatic head  suspicious for pancreatic adenocarcinoma. There was suspected involvement of the distal common duct with secondary intrahepatic/extrahepatic ductal dilatation. Lesion noted to abut the IVC. Multiple liver metastases noted. Small upper abdominal/retroperitoneal lymph nodes noted.   ERCP 09/09/2018-high-grade stricture and obstruction of the distal common bile duct. A metallic biliary stent was placed. Brushings in the mid and lower third of the main bile duct were obtained with no malignant cells identified.   Bilirubin improved at 4.7 on 09/10/2018.   Biopsy of a liver lesion on 09/11/2018-metastatic carcinoma, cytokeratin 7 and TTF-1 positive, negative for cytokeratin 20, CDX 2, and PSA. Foundation 1-microsatellite stable, tumor mutation burden 16, ERBB2 amplification  CT chest 09/22/2018-right hilar/mediastinal/retrocrural lymphadenopathy, ill-defined right lower lobe nodularity, emphysema  Cycle 1 carboplatin/Alimta/pembrolizumab 10/09/2018  Cycle 2 carboplatin/Alimta/pembrolizumab 10/30/2018  Cycle 3 carboplatin/Alimta/pembrolizumab 11/20/2018  Cycle 4 carboplatin/Alimta/pembrolizumab 12/11/2018  CT 12/26/2018-marked response to therapy of the radical abdominal adenopathy, decreased size of hepatic metastases, no new or progressive disease  Cycle 5 Alimta/pembrolizumab 01/01/2019  Cycle 6 Alimta/pembrolizumab 01/23/2019  Cycle 7 Alimta/pembrolizumab 02/12/2019  Cycle 8 Alimta/pembrolizumab 03/05/2019  Cycle 9 Alimta/pembrolizumab 03/26/2019  CTs 04/14/2019-slight interval decrease in size and conspicuity of multiple, irregular low-attenuation liver lesions. No new liver lesions. No recurrent mediastinal or hilar lymphadenopathy. No change in mildly prominent subcentimeter periaortic and peripheral lymph nodes without evidence of recurrent abdominal or pelvic lymphadenopathy.  Cycle 10 Alimta/pembrolizumab 04/16/2019  Cycle 11 pembrolizumab 05/07/2019 (Alimta held secondary to  increased tearing and leg edema)  Cycle 12 Alimta/pembrolizumab 05/28/2019  Cycle 13 Alimta/pembrolizumab 06/18/2019  Restaging CTs 07/08/2019-worsening of low-density hepatic lesions. Scattered small lymph nodes with increasing conspicuity, largest along the left periaortic chain approximately 9 mm previously approximately 5 mm.  Cycle 14 Alimta/pembrolizumab 07/09/2019  Cycle 15 Alimta/pembrolizumab 07/31/2019  Cycle 16 Alimta/pembrolizumab 08/20/2019  Rising CEA beginning December 2020  CTs 09/08/2019-enlargement of liver metastases, mild enlargement of abdominal/retroperitoneal and lower thoracic nodes, nonspecific pulmonary nodules-stable  Cycle 1 gemcitabine 09/17/2019  Cycle 2 gemcitabine 10/01/2019  Cycle 3 gemcitabine 10/15/2019  Cycle 4 gemcitabine 10/29/2019  Cycle 5 gemcitabine 11/12/2019  Rising CEA 10/2019  CTs 11/19/19 mixed response in liver, stable pulmonary nodules, thoracic and RP nodes, overall stable  Plan to intensify chemo carboplatin AUC 4 plus gemcitabine given on day 1 q21 days starting ~12/03/19  Admission 11/30/2019 with recurrent biliary obstruction-ERCP 12/02/2019-obstructed biliary stent, new stent placed  Cycle 1 gemcitabine/carboplatin 12/24/2019  Cycle 2 gemcitabine/carboplatin 01/14/2020  Cycle 3 gemcitabine/carboplatin 02/04/2020  Cycle 4 gemcitabine/carboplatin 02/25/2020  CTs 03/15/2020-decrease in size of hepatic metastatic lesions. No new liver lesions. Newsclerotic lesions in the thoracic and lumbar spine. Stable small pulmonary nodules. Asymmetric wall thickening of the medial wall of the gallbladder new since previous exam without signs of inflammation.  Cycle5gemcitabine/carboplatin 03/17/2020  Cycle 6 gemcitabine/carboplatin 04/15/2020  Cycle 7 gemcitabine/carboplatin 05/09/2020  Cycle 8 gemcitabine/carboplatin 05/31/2020  CTs 06/16/2020-unchanged sclerotic bone lesions, increase in size and number of liver metastases  Cycle 1  docetaxel/ramucirumab 06/27/2020  Cycle 2 docetaxel/ramucirumab 07/18/2020  Cycle 3 docetaxel/ramucirumab 08/08/2020  Cycle 4 docetaxel/ramucirumab 08/29/2020  CT 09/16/2020-decreased size of liver lesions, stable bone metastases  Cycle 5 docetaxel/ramucirumab 09/19/2020  Cycle 6 docetaxel/ramucirumab 10/10/2020  Cycle 7 docetaxel/ramucirumab 11/01/2020  Cycle 8 docetaxel/ramucirumab 11/22/2020 2. COPD 3. Hypertension 4. Trace edema right leg 12/11/2018. Doppler negative for DVT 12/12/2018. 5. Watery eyes, runny nose reported 12/11/2018. Question related to Alimta. Persistent watery eyes 04/16/2019. Ophthalmology evaluation 05/11/2019-blepharitis,ectropion left lower lid, macular degeneration 6. Admission 12/24/2018 with a fever-no source for infection identified, completed outpatient course of ciprofloxacin 7. Port-A-Cath placement 09/14/2019, interventional radiology 8. Colonoscopy 06/16/2015-diverticulosis in the sigmoid colon, examination otherwise normal      Disposition: Ricky Conway appears stable.  He complete another cycle of docetaxel/ramucirumab today.  He will undergo a restaging CT evaluation prior to an office visit in 3 weeks.  We will follow-up on the CEA from today.  The CEA was lower when he was here on 11/01/2020.  [name], MD  11/22/2020  10:32 AM

## 2020-11-22 NOTE — Patient Instructions (Signed)
[facility]  Discharge Instructions: Thank you for choosing [facility] to provide your oncology and hematology care.   If you have a lab appointment with the [facility], please go directly to the [facility] and check in at the registration area.   Wear comfortable clothing and clothing appropriate for easy access to any Portacath or PICC line.   We strive to give you quality time with your provider. You may need to reschedule your appointment if you arrive late (15 or more minutes).  Arriving late affects you and other patients whose appointments are after yours.  Also, if you miss three or more appointments without notifying the office, you may be dismissed from the clinic at the provider's discretion.      For prescription refill requests, have your pharmacy contact our office and allow 72 hours for refills to be completed.    Today you received the following chemotherapy and/or immunotherapy agents Cyramza and Taxotere   To help prevent nausea and vomiting after your treatment, we encourage you to take your nausea medication as directed.  BELOW ARE SYMPTOMS THAT SHOULD BE REPORTED IMMEDIATELY: . *FEVER GREATER THAN 100.4 F (38 C) OR HIGHER . *CHILLS OR SWEATING . *NAUSEA AND VOMITING THAT IS NOT CONTROLLED WITH YOUR NAUSEA MEDICATION . *UNUSUAL SHORTNESS OF BREATH . *UNUSUAL BRUISING OR BLEEDING . *URINARY PROBLEMS (pain or burning when urinating, or frequent urination) . *BOWEL PROBLEMS (unusual diarrhea, constipation, pain near the anus) . TENDERNESS IN MOUTH AND THROAT WITH OR WITHOUT PRESENCE OF ULCERS (sore throat, sores in mouth, or a toothache) . UNUSUAL RASH, SWELLING OR PAIN  . UNUSUAL VAGINAL DISCHARGE OR ITCHING   Items with * indicate a potential emergency and should be followed up as soon as possible or go to the Emergency Department if any problems should occur.  Please show the CHEMOTHERAPY ALERT CARD or IMMUNOTHERAPY  ALERT CARD at check-in to the Emergency Department and triage nurse.  Should you have questions after your visit or need to cancel or reschedule your appointment, please contact [facility]  Dept: [phone]  and follow the prompts.  Office hours are 8:00 a.m. to 4:30 p.m. Monday - Friday. Please note that voicemails left after 4:00 p.m. may not be returned until the following business day.  We are closed weekends and major holidays. You have access to a nurse at all times for urgent questions. Please call the main number to the clinic Dept: [phone] and follow the prompts.   For any non-urgent questions, you may also contact your provider using MyChart. We now offer e-Visits for anyone 34 and older to request care online for non-urgent symptoms. For details visit [email or web address].   Also download the MyChart app! Go to the app store, search "MyChart", open the app, select [facility], and log in with your MyChart username and password.  Due to Covid, a mask is required upon entering the hospital/clinic. If you do not have a mask, one will be given to you upon arrival. For doctor visits, patients may have 1 support person aged 39 or older with them. For treatment visits, patients cannot have anyone with them due to current Covid guidelines and our immunocompromised population.   Ramucirumab injection What is this medicine? RAMUCIRUMAB (ra mue SIR ue mab) is a monoclonal antibody. It is used to treat stomach cancer, colorectal cancer, liver cancer, and lung cancer. This medicine may be used for other purposes; ask your  health care provider or pharmacist if you have questions. COMMON BRAND NAME(S): Cyramza What should I tell my health care provider before I take this medicine? They need to know if you have any of these conditions:  bleeding disorders  blood clots  heart disease, including heart failure, heart attack, or chest pain (angina)  high blood  pressure  infection (especially a virus infection such as chickenpox, cold sores, or herpes)  protein in your urine  recent or planning to have surgery  stroke  an unusual or allergic reaction to ramucirumab, other medicines, foods, dyes, or preservatives  pregnant or trying to get pregnant  breast-feeding How should I use this medicine? This medicine is for infusion into a vein. It is given by a health care professional in a hospital or clinic setting. Talk to your pediatrician regarding the use of this medicine in children. Special care may be needed. Overdosage: If you think you have taken too much of this medicine contact a poison control center or emergency room at once. NOTE: This medicine is only for you. Do not share this medicine with others. What if I miss a dose? It is important not to miss your dose. Call your doctor or health care professional if you are unable to keep an appointment. What may interact with this medicine? Interactions have not been studied. This list may not describe all possible interactions. Give your health care provider a list of all the medicines, herbs, non-prescription drugs, or dietary supplements you use. Also tell them if you smoke, drink alcohol, or use illegal drugs. Some items may interact with your medicine. What should I watch for while using this medicine? Your condition will be monitored carefully while you are receiving this medicine. You will need to to check your blood pressure and have your blood and urine tested while you are taking this medicine. Your condition will be monitored carefully while you are receiving this medicine. This medicine may increase your risk to bruise or bleed. Call your doctor or health care professional if you notice any unusual bleeding. Before having surgery, talk to your health care provider to make sure it is ok. This drug can increase the risk of poor healing of your surgical site or wound. You will need to  stop this drug for 28 days before surgery. After surgery, wait at least 2 weeks before restarting this drug. Make sure the surgical site or wound is healed enough before restarting this drug. Talk to your health care provider if questions. Do not become pregnant while taking this medicine or for 3 months after stopping it. Women should inform their doctor if they wish to become pregnant or think they might be pregnant. There is a potential for serious side effects to an unborn child. Talk to your health care professional or pharmacist for more information. Do not breast-feed an infant while taking this medicine or for 2 months after stopping it. This medicine may interfere with the ability to have a child. Talk with your doctor or health care professional if you are concerned about your fertility. What side effects may I notice from receiving this medicine? Side effects that you should report to your doctor or health care professional as soon as possible:  allergic reactions like skin rash, itching or hives, breathing problems, swelling of the face, lips, or tongue  signs of infection - fever or chills, cough, sore throat  chest pain or chest tightness  confusion  dizziness  feeling faint or lightheaded, falls  severe abdominal pain  severe nausea, vomiting  signs and symptoms of bleeding such as bloody or black, tarry stools; red or dark-brown urine; spitting up blood or brown material that looks like coffee grounds; red spots on the skin; unusual bruising or bleeding from the eye, gums, or nose  signs and symptoms of a blood clot such as breathing problems; changes in vision; chest pain; severe, sudden headache; pain, swelling, warmth in the leg; trouble speaking; sudden numbness or weakness of the face, arm or leg  symptoms of a stroke: change in mental awareness, inability to talk or move one side of the body  trouble walking, dizziness, loss of balance or coordination Side effects  that usually do not require medical attention (report to your doctor or health care professional if they continue or are bothersome):  cold, clammy skin  constipation  diarrhea  headache  nausea, vomiting  stomach pain  unusually slow heartbeat  unusually weak or tired This list may not describe all possible side effects. Call your doctor for medical advice about side effects. You may report side effects to FDA at 1-800-FDA-1088. Where should I keep my medicine? This drug is given in a hospital or clinic and will not be stored at home. NOTE: This sheet is a summary. It may not cover all possible information. If you have questions about this medicine, talk to your doctor, pharmacist, or health care provider.  2021 Elsevier/Gold Standard (2019-04-08 11:17:50)   Docetaxel injection What is this medicine? DOCETAXEL (doe se TAX el) is a chemotherapy drug. It targets fast dividing cells, like cancer cells, and causes these cells to die. This medicine is used to treat many types of cancers like breast cancer, certain stomach cancers, head and neck cancer, lung cancer, and prostate cancer. This medicine may be used for other purposes; ask your health care provider or pharmacist if you have questions. COMMON BRAND NAME(S): Docefrez, Taxotere What should I tell my health care provider before I take this medicine? They need to know if you have any of these conditions:  infection (especially a virus infection such as chickenpox, cold sores, or herpes)  liver disease  low blood counts, like low white cell, platelet, or red cell counts  an unusual or allergic reaction to docetaxel, polysorbate 80, other chemotherapy agents, other medicines, foods, dyes, or preservatives  pregnant or trying to get pregnant  breast-feeding How should I use this medicine? This drug is given as an infusion into a vein. It is administered in a hospital or clinic by a specially trained health care  professional. Talk to your pediatrician regarding the use of this medicine in children. Special care may be needed. Overdosage: If you think you have taken too much of this medicine contact a poison control center or emergency room at once. NOTE: This medicine is only for you. Do not share this medicine with others. What if I miss a dose? It is important not to miss your dose. Call your doctor or health care professional if you are unable to keep an appointment. What may interact with this medicine? Do not take this medicine with any of the following medications:  live virus vaccines This medicine may also interact with the following medications:  aprepitant  certain antibiotics like erythromycin or clarithromycin  certain antivirals for HIV or hepatitis  certain medicines for fungal infections like fluconazole, itraconazole, ketoconazole, posaconazole, or voriconazole  cimetidine  ciprofloxacin  conivaptan  cyclosporine  dronedarone  fluvoxamine  grapefruit juice  imatinib  verapamil This list may not describe all possible interactions. Give your health care provider a list of all the medicines, herbs, non-prescription drugs, or dietary supplements you use. Also tell them if you smoke, drink alcohol, or use illegal drugs. Some items may interact with your medicine. What should I watch for while using this medicine? Your condition will be monitored carefully while you are receiving this medicine. You will need important blood work done while you are taking this medicine. Call your doctor or health care professional for advice if you get a fever, chills or sore throat, or other symptoms of a cold or flu. Do not treat yourself. This drug decreases your body's ability to fight infections. Try to avoid being around people who are sick. Some products may contain alcohol. Ask your health care professional if this medicine contains alcohol. Be sure to tell all health care  professionals you are taking this medicine. Certain medicines, like metronidazole and disulfiram, can cause an unpleasant reaction when taken with alcohol. The reaction includes flushing, headache, nausea, vomiting, sweating, and increased thirst. The reaction can last from 30 minutes to several hours. You may get drowsy or dizzy. Do not drive, use machinery, or do anything that needs mental alertness until you know how this medicine affects you. Do not stand or sit up quickly, especially if you are an older patient. This reduces the risk of dizzy or fainting spells. Alcohol may interfere with the effect of this medicine. Talk to your health care professional about your risk of cancer. You may be more at risk for certain types of cancer if you take this medicine. Do not become pregnant while taking this medicine or for 6 months after stopping it. Women should inform their doctor if they wish to become pregnant or think they might be pregnant. There is a potential for serious side effects to an unborn child. Talk to your health care professional or pharmacist for more information. Do not breast-feed an infant while taking this medicine or for 1 week after stopping it. Males who get this medicine must use a condom during sex with females who can get pregnant. If you get a woman pregnant, the baby could have birth defects. The baby could die before they are born. You will need to continue wearing a condom for 3 months after stopping the medicine. Tell your health care provider right away if your partner becomes pregnant while you are taking this medicine. This may interfere with the ability to father a child. You should talk to your doctor or health care professional if you are concerned about your fertility. What side effects may I notice from receiving this medicine? Side effects that you should report to your doctor or health care professional as soon as possible:  allergic reactions like skin rash, itching  or hives, swelling of the face, lips, or tongue  blurred vision  breathing problems  changes in vision  low blood counts - This drug may decrease the number of white blood cells, red blood cells and platelets. You may be at increased risk for infections and bleeding.  nausea and vomiting  pain, redness or irritation at site where injected  pain, tingling, numbness in the hands or feet  redness, blistering, peeling, or loosening of the skin, including inside the mouth  signs of decreased platelets or bleeding - bruising, pinpoint red spots on the skin, black, tarry stools, nosebleeds  signs of decreased red blood cells - unusually weak or tired, fainting spells, lightheadedness  signs of infection - fever or chills, cough, sore throat, pain or difficulty passing urine  swelling of the ankle, feet, hands Side effects that usually do not require medical attention (report to your doctor or health care professional if they continue or are bothersome):  constipation  diarrhea  fingernail or toenail changes  hair loss  loss of appetite  mouth sores  muscle pain This list may not describe all possible side effects. Call your doctor for medical advice about side effects. You may report side effects to FDA at 1-800-FDA-1088. Where should I keep my medicine? This drug is given in a hospital or clinic and will not be stored at home. NOTE: This sheet is a summary. It may not cover all possible information. If you have questions about this medicine, talk to your doctor, pharmacist, or health care provider.  2021 Elsevier/Gold Standard (2019-05-11 19:50:31)

## 2020-11-22 NOTE — Patient Instructions (Signed)

## 2020-11-24 ENCOUNTER — Inpatient Hospital Stay: Payer: Medicare Other | Attending: Oncology

## 2020-11-24 ENCOUNTER — Other Ambulatory Visit: Payer: Self-pay

## 2020-11-24 VITALS — BP 127/67 | HR 90 | Temp 98.3°F | Resp 18

## 2020-11-24 DIAGNOSIS — C787 Secondary malignant neoplasm of liver and intrahepatic bile duct: Secondary | ICD-10-CM | POA: Insufficient documentation

## 2020-11-24 DIAGNOSIS — C3431 Malignant neoplasm of lower lobe, right bronchus or lung: Secondary | ICD-10-CM | POA: Diagnosis present

## 2020-11-24 DIAGNOSIS — Z5189 Encounter for other specified aftercare: Secondary | ICD-10-CM | POA: Insufficient documentation

## 2020-11-24 DIAGNOSIS — C3490 Malignant neoplasm of unspecified part of unspecified bronchus or lung: Secondary | ICD-10-CM

## 2020-11-24 MED ORDER — PEGFILGRASTIM-CBQV 6 MG/0.6ML ~~LOC~~ SOSY
6.0000 mg | PREFILLED_SYRINGE | Freq: Once | SUBCUTANEOUS | Status: AC
  Administered 2020-11-24: 6 mg via SUBCUTANEOUS

## 2020-11-24 NOTE — Patient Instructions (Signed)
[facility]  Discharge Instructions: Thank you for choosing [facility] to provide your oncology and hematology care.   If you have a lab appointment with the [facility], please go directly to the [facility] and check in at the registration area.   Wear comfortable clothing and clothing appropriate for easy access to any Portacath or PICC line.   We strive to give you quality time with your provider. You may need to reschedule your appointment if you arrive late (15 or more minutes).  Arriving late affects you and other patients whose appointments are after yours.  Also, if you miss three or more appointments without notifying the office, you may be dismissed from the clinic at the provider's discretion.      For prescription refill requests, have your pharmacy contact our office and allow 72 hours for refills to be completed.    Today you received the following chemotherapy and/or immunotherapy agents Udencya       To help prevent nausea and vomiting after your treatment, we encourage you to take your nausea medication as directed.  BELOW ARE SYMPTOMS THAT SHOULD BE REPORTED IMMEDIATELY: . *FEVER GREATER THAN 100.4 F (38 C) OR HIGHER . *CHILLS OR SWEATING . *NAUSEA AND VOMITING THAT IS NOT CONTROLLED WITH YOUR NAUSEA MEDICATION . *UNUSUAL SHORTNESS OF BREATH . *UNUSUAL BRUISING OR BLEEDING . *URINARY PROBLEMS (pain or burning when urinating, or frequent urination) . *BOWEL PROBLEMS (unusual diarrhea, constipation, pain near the anus) . TENDERNESS IN MOUTH AND THROAT WITH OR WITHOUT PRESENCE OF ULCERS (sore throat, sores in mouth, or a toothache) . UNUSUAL RASH, SWELLING OR PAIN  . UNUSUAL VAGINAL DISCHARGE OR ITCHING   Items with * indicate a potential emergency and should be followed up as soon as possible or go to the Emergency Department if any problems should occur.  Please show the CHEMOTHERAPY ALERT CARD or IMMUNOTHERAPY ALERT CARD  at check-in to the Emergency Department and triage nurse.  Should you have questions after your visit or need to cancel or reschedule your appointment, please contact [facility]  Dept: [phone]  and follow the prompts.  Office hours are 8:00 a.m. to 4:30 p.m. Monday - Friday. Please note that voicemails left after 4:00 p.m. may not be returned until the following business day.  We are closed weekends and major holidays. You have access to a nurse at all times for urgent questions. Please call the main number to the clinic Dept: [phone] and follow the prompts.   For any non-urgent questions, you may also contact your provider using MyChart. We now offer e-Visits for anyone 42 and older to request care online for non-urgent symptoms. For details visit [email or web address].   Also download the MyChart app! Go to the app store, search "MyChart", open the app, select [facility], and log in with your MyChart username and password.  Due to Covid, a mask is required upon entering the hospital/clinic. If you do not have a mask, one will be given to you upon arrival. For doctor visits, patients may have 1 support person aged 39 or older with them. For treatment visits, patients cannot have anyone with them due to current Covid guidelines and our immunocompromised population.   Pegfilgrastim injection What is this medicine? PEGFILGRASTIM (PEG fil gra stim) is a long-acting granulocyte colony-stimulating factor that stimulates the growth of neutrophils, a type of white blood cell important in the body's fight against infection. It is  used to reduce the incidence of fever and infection in patients with certain types of cancer who are receiving chemotherapy that affects the bone marrow, and to increase survival after being exposed to high doses of radiation. This medicine may be used for other purposes; ask your health care provider or pharmacist if you have  questions. COMMON BRAND NAME(S): Rexene Edison, Ziextenzo What should I tell my health care provider before I take this medicine? They need to know if you have any of these conditions:  kidney disease  latex allergy  ongoing radiation therapy  sickle cell disease  skin reactions to acrylic adhesives (On-Body Injector only)  an unusual or allergic reaction to pegfilgrastim, filgrastim, other medicines, foods, dyes, or preservatives  pregnant or trying to get pregnant  breast-feeding How should I use this medicine? This medicine is for injection under the skin. If you get this medicine at home, you will be taught how to prepare and give the pre-filled syringe or how to use the On-body Injector. Refer to the patient Instructions for Use for detailed instructions. Use exactly as directed. Tell your healthcare provider immediately if you suspect that the On-body Injector may not have performed as intended or if you suspect the use of the On-body Injector resulted in a missed or partial dose. It is important that you put your used needles and syringes in a special sharps container. Do not put them in a trash can. If you do not have a sharps container, call your pharmacist or healthcare provider to get one. Talk to your pediatrician regarding the use of this medicine in children. While this drug may be prescribed for selected conditions, precautions do apply. Overdosage: If you think you have taken too much of this medicine contact a poison control center or emergency room at once. NOTE: This medicine is only for you. Do not share this medicine with others. What if I miss a dose? It is important not to miss your dose. Call your doctor or health care professional if you miss your dose. If you miss a dose due to an On-body Injector failure or leakage, a new dose should be administered as soon as possible using a single prefilled syringe for manual use. What may interact  with this medicine? Interactions have not been studied. This list may not describe all possible interactions. Give your health care provider a list of all the medicines, herbs, non-prescription drugs, or dietary supplements you use. Also tell them if you smoke, drink alcohol, or use illegal drugs. Some items may interact with your medicine. What should I watch for while using this medicine? Your condition will be monitored carefully while you are receiving this medicine. You may need blood work done while you are taking this medicine. Talk to your health care provider about your risk of cancer. You may be more at risk for certain types of cancer if you take this medicine. If you are going to need a MRI, CT scan, or other procedure, tell your doctor that you are using this medicine (On-Body Injector only). What side effects may I notice from receiving this medicine? Side effects that you should report to your doctor or health care professional as soon as possible:  allergic reactions (skin rash, itching or hives, swelling of the face, lips, or tongue)  back pain  dizziness  fever  pain, redness, or irritation at site where injected  pinpoint red spots on the skin  red or dark-brown urine  shortness of breath  or breathing problems  stomach or side pain, or pain at the shoulder  swelling  tiredness  trouble passing urine or change in the amount of urine  unusual bruising or bleeding Side effects that usually do not require medical attention (report to your doctor or health care professional if they continue or are bothersome):  bone pain  muscle pain This list may not describe all possible side effects. Call your doctor for medical advice about side effects. You may report side effects to FDA at 1-800-FDA-1088. Where should I keep my medicine? Keep out of the reach of children. If you are using this medicine at home, you will be instructed on how to store it. Throw away any  unused medicine after the expiration date on the label. NOTE: This sheet is a summary. It may not cover all possible information. If you have questions about this medicine, talk to your doctor, pharmacist, or health care provider.  2021 Elsevier/Gold Standard (2019-07-03 13:20:51)

## 2020-12-09 ENCOUNTER — Ambulatory Visit (HOSPITAL_BASED_OUTPATIENT_CLINIC_OR_DEPARTMENT_OTHER)
Admission: RE | Admit: 2020-12-09 | Discharge: 2020-12-09 | Disposition: A | Discharge status: Home / Self Care | Payer: Medicare Other | Source: Ambulatory Visit | Attending: Oncology | Admitting: Oncology

## 2020-12-09 ENCOUNTER — Other Ambulatory Visit: Payer: Self-pay

## 2020-12-09 DIAGNOSIS — C3490 Malignant neoplasm of unspecified part of unspecified bronchus or lung: Secondary | ICD-10-CM | POA: Insufficient documentation

## 2020-12-09 MED ORDER — IOHEXOL 300 MG/ML  SOLN
75.0000 mL | Freq: Once | INTRAMUSCULAR | Status: AC | PRN
  Administered 2020-12-09: 75 mL via INTRAVENOUS

## 2020-12-10 ENCOUNTER — Other Ambulatory Visit: Payer: Self-pay | Admitting: Oncology

## 2020-12-13 ENCOUNTER — Inpatient Hospital Stay: Payer: Medicare Other

## 2020-12-13 ENCOUNTER — Inpatient Hospital Stay (HOSPITAL_BASED_OUTPATIENT_CLINIC_OR_DEPARTMENT_OTHER): Payer: Medicare Other | Admitting: Nurse Practitioner

## 2020-12-13 ENCOUNTER — Encounter: Payer: Self-pay | Admitting: Nurse Practitioner

## 2020-12-13 ENCOUNTER — Other Ambulatory Visit: Payer: Self-pay

## 2020-12-13 VITALS — BP 124/74 | HR 90 | Temp 98.1°F | Resp 19 | Ht 71.0 in | Wt 181.0 lb

## 2020-12-13 DIAGNOSIS — C3490 Malignant neoplasm of unspecified part of unspecified bronchus or lung: Secondary | ICD-10-CM | POA: Diagnosis not present

## 2020-12-13 DIAGNOSIS — C787 Secondary malignant neoplasm of liver and intrahepatic bile duct: Secondary | ICD-10-CM | POA: Diagnosis not present

## 2020-12-13 DIAGNOSIS — R5383 Other fatigue: Secondary | ICD-10-CM

## 2020-12-13 LAB — CBC WITH DIFFERENTIAL (CANCER CENTER ONLY)
Abs Immature Granulocytes: 0.07 10*3/uL (ref 0.00–0.07)
Basophils Absolute: 0.1 10*3/uL (ref 0.0–0.1)
Basophils Relative: 1 %
Eosinophils Absolute: 0 10*3/uL (ref 0.0–0.5)
Eosinophils Relative: 0 %
HCT: 45.7 % (ref 39.0–52.0)
Hemoglobin: 15.2 g/dL (ref 13.0–17.0)
Immature Granulocytes: 0 %
Lymphocytes Relative: 7 %
Lymphs Abs: 1.1 10*3/uL (ref 0.7–4.0)
MCH: 32.1 pg (ref 26.0–34.0)
MCHC: 33.3 g/dL (ref 30.0–36.0)
MCV: 96.4 fL (ref 80.0–100.0)
Monocytes Absolute: 1.2 10*3/uL — ABNORMAL HIGH (ref 0.1–1.0)
Monocytes Relative: 8 %
Neutro Abs: 13.3 10*3/uL — ABNORMAL HIGH (ref 1.7–7.7)
Neutrophils Relative %: 84 %
Platelet Count: 230 10*3/uL (ref 150–400)
RBC: 4.74 MIL/uL (ref 4.22–5.81)
RDW: 15.9 % — ABNORMAL HIGH (ref 11.5–15.5)
WBC Count: 15.8 10*3/uL — ABNORMAL HIGH (ref 4.0–10.5)
nRBC: 0 % (ref 0.0–0.2)

## 2020-12-13 LAB — CMP (CANCER CENTER ONLY)
ALT: 16 U/L (ref 0–44)
AST: 23 U/L (ref 15–41)
Albumin: 3.4 g/dL — ABNORMAL LOW (ref 3.5–5.0)
Alkaline Phosphatase: 78 U/L (ref 38–126)
Anion gap: 7 (ref 5–15)
BUN: 10 mg/dL (ref 8–23)
CO2: 31 mmol/L (ref 22–32)
Calcium: 8.6 mg/dL — ABNORMAL LOW (ref 8.9–10.3)
Chloride: 96 mmol/L — ABNORMAL LOW (ref 98–111)
Creatinine: 0.47 mg/dL — ABNORMAL LOW (ref 0.61–1.24)
GFR, Estimated: >60 mL/min (ref >60)
Glucose, Bld: 113 mg/dL — ABNORMAL HIGH (ref 70–99)
Potassium: 3.8 mmol/L (ref 3.5–5.1)
Sodium: 134 mmol/L — ABNORMAL LOW (ref 135–145)
Total Bilirubin: 0.7 mg/dL (ref 0.3–1.2)
Total Protein: 5.8 g/dL — ABNORMAL LOW (ref 6.5–8.1)

## 2020-12-13 LAB — TOTAL PROTEIN, URINE DIPSTICK: Protein, ur: NEGATIVE mg/dL

## 2020-12-13 LAB — CEA (ACCESS): CEA (CHCC): 3883.05 ng/mL — ABNORMAL HIGH (ref 0.00–5.00)

## 2020-12-13 MED ORDER — SODIUM CHLORIDE 0.9 % IV SOLN
60.0000 mg/m2 | Freq: Once | INTRAVENOUS | Status: AC
  Administered 2020-12-13: 120 mg via INTRAVENOUS
  Filled 2020-12-13: qty 12

## 2020-12-13 MED ORDER — SODIUM CHLORIDE 0.9 % IV SOLN
10.0000 mg/kg | Freq: Once | INTRAVENOUS | Status: AC
  Administered 2020-12-13: 800 mg via INTRAVENOUS
  Filled 2020-12-13: qty 50

## 2020-12-13 MED ORDER — SODIUM CHLORIDE 0.9 % IV SOLN
10.0000 mg | Freq: Once | INTRAVENOUS | Status: AC
  Administered 2020-12-13: 10 mg via INTRAVENOUS
  Filled 2020-12-13: qty 1

## 2020-12-13 MED ORDER — SODIUM CHLORIDE 0.9 % IV SOLN
Freq: Once | INTRAVENOUS | Status: AC
  Filled 2020-12-13: qty 250

## 2020-12-13 MED ORDER — DIPHENHYDRAMINE HCL 50 MG/ML IJ SOLN
25.0000 mg | Freq: Once | INTRAMUSCULAR | Status: AC
  Administered 2020-12-13: 25 mg via INTRAVENOUS
  Filled 2020-12-13: qty 1

## 2020-12-13 MED ORDER — SODIUM CHLORIDE 0.9% FLUSH
10.0000 mL | INTRAVENOUS | Status: DC | PRN
  Administered 2020-12-13: 10 mL
  Filled 2020-12-13: qty 10

## 2020-12-13 MED ORDER — ACETAMINOPHEN 325 MG PO TABS
650.0000 mg | ORAL_TABLET | Freq: Once | ORAL | Status: AC
  Administered 2020-12-13: 650 mg via ORAL
  Filled 2020-12-13: qty 2

## 2020-12-13 MED ORDER — HEPARIN SOD (PORK) LOCK FLUSH 100 UNIT/ML IV SOLN
500.0000 [IU] | Freq: Once | INTRAVENOUS | Status: AC | PRN
  Administered 2020-12-13: 500 [IU]
  Filled 2020-12-13: qty 5

## 2020-12-13 NOTE — Patient Instructions (Signed)
[facility]  Discharge Instructions: Thank you for choosing [facility] to provide your oncology and hematology care.   If you have a lab appointment with the [facility], please go directly to the [facility] and check in at the registration area.   Wear comfortable clothing and clothing appropriate for easy access to any Portacath or PICC line.   We strive to give you quality time with your provider. You may need to reschedule your appointment if you arrive late (15 or more minutes).  Arriving late affects you and other patients whose appointments are after yours.  Also, if you miss three or more appointments without notifying the office, you may be dismissed from the clinic at the provider's discretion.      For prescription refill requests, have your pharmacy contact our office and allow 72 hours for refills to be completed.    Today you received the following chemotherapy and/or immunotherapy agents  ramucirumab, docetaxel   To help prevent nausea and vomiting after your treatment, we encourage you to take your nausea medication as directed.  BELOW ARE SYMPTOMS THAT SHOULD BE REPORTED IMMEDIATELY: *FEVER GREATER THAN 100.4 F (38 C) OR HIGHER *CHILLS OR SWEATING *NAUSEA AND VOMITING THAT IS NOT CONTROLLED WITH YOUR NAUSEA MEDICATION *UNUSUAL SHORTNESS OF BREATH *UNUSUAL BRUISING OR BLEEDING *URINARY PROBLEMS (pain or burning when urinating, or frequent urination) *BOWEL PROBLEMS (unusual diarrhea, constipation, pain near the anus) TENDERNESS IN MOUTH AND THROAT WITH OR WITHOUT PRESENCE OF ULCERS (sore throat, sores in mouth, or a toothache) UNUSUAL RASH, SWELLING OR PAIN  UNUSUAL VAGINAL DISCHARGE OR ITCHING   Items with * indicate a potential emergency and should be followed up as soon as possible or go to the Emergency Department if any problems should occur.  Please show the CHEMOTHERAPY ALERT CARD or IMMUNOTHERAPY ALERT CARD at  check-in to the Emergency Department and triage nurse.  Should you have questions after your visit or need to cancel or reschedule your appointment, please contact [facility]  Dept: [phone]  and follow the prompts.  Office hours are 8:00 a.m. to 4:30 p.m. Monday - Friday. Please note that voicemails left after 4:00 p.m. may not be returned until the following business day.  We are closed weekends and major holidays. You have access to a nurse at all times for urgent questions. Please call the main number to the clinic Dept: [phone] and follow the prompts.   For any non-urgent questions, you may also contact your provider using MyChart. We now offer e-Visits for anyone 30 and older to request care online for non-urgent symptoms. For details visit [email or web address].   Also download the MyChart app! Go to the app store, search "MyChart", open the app, select [facility], and log in with your MyChart username and password.  Due to Covid, a mask is required upon entering the hospital/clinic. If you do not have a mask, one will be given to you upon arrival. For doctor visits, patients may have 1 support person aged 54 or older with them. For treatment visits, patients cannot have anyone with them due to current Covid guidelines and our immunocompromised population.   Ramucirumab injection What is this medication? RAMUCIRUMAB (ra mue SIR ue mab) is a monoclonal antibody. It is used to treatstomach cancer, colorectal cancer, liver cancer, and lung cancer. This medicine may be used for other purposes; ask your health care provider orpharmacist if you have questions. COMMON BRAND NAME(S):  Cyramza What should I tell my care team before I take this medication? They need to know if you have any of these conditions: bleeding disorders blood clots heart disease, including heart failure, heart attack, or chest pain (angina) high blood pressure infection (especially a  virus infection such as chickenpox, cold sores, or herpes) protein in your urine recent or planning to have surgery stroke an unusual or allergic reaction to ramucirumab, other medicines, foods, dyes, or preservatives pregnant or trying to get pregnant breast-feeding How should I use this medication? This medicine is for infusion into a vein. It is given by a health careprofessional in a hospital or clinic setting. Talk to your pediatrician regarding the use of this medicine in children.Special care may be needed. Overdosage: If you think you have taken too much of this medicine contact apoison control center or emergency room at once. NOTE: This medicine is only for you. Do not share this medicine with others. What if I miss a dose? It is important not to miss your dose. Call your doctor or health careprofessional if you are unable to keep an appointment. What may interact with this medication? Interactions have not been studied. This list may not describe all possible interactions. Give your health care provider a list of all the medicines, herbs, non-prescription drugs, or dietary supplements you use. Also tell them if you smoke, drink alcohol, or use illegaldrugs. Some items may interact with your medicine. What should I watch for while using this medication? Your condition will be monitored carefully while you are receiving this medicine. You will need to to check your blood pressure and have your blood andurine tested while you are taking this medicine. Your condition will be monitored carefully while you are receiving thismedicine. This medicine may increase your risk to bruise or bleed. Call your doctor orhealth care professional if you notice any unusual bleeding. Before having surgery, talk to your health care provider to make sure it is ok. This drug can increase the risk of poor healing of your surgical site or wound. You will need to stop this drug for 28 days before surgery. After  surgery, wait at least 2 weeks before restarting this drug. Make sure the surgical site or wound is healed enough before restarting this drug. Talk to your health careprovider if questions. Do not become pregnant while taking this medicine or for 3 months after stopping it. Women should inform their doctor if they wish to become pregnant or think they might be pregnant. There is a potential for serious side effects to an unborn child. Talk to your health care professional or pharmacist for more information. Do not breast-feed an infant while taking this medicine orfor 2 months after stopping it. This medicine may interfere with the ability to have a child. Talk with yourdoctor or health care professional if you are concerned about your fertility. What side effects may I notice from receiving this medication? Side effects that you should report to your doctor or health care professionalas soon as possible: allergic reactions like skin rash, itching or hives, breathing problems, swelling of the face, lips, or tongue signs of infection - fever or chills, cough, sore throat chest pain or chest tightness confusion dizziness feeling faint or lightheaded, falls severe abdominal pain severe nausea, vomiting signs and symptoms of bleeding such as bloody or black, tarry stools; red or dark-brown urine; spitting up blood or brown material that looks like coffee grounds; red spots on the skin; unusual bruising or bleeding  from the eye, gums, or nose signs and symptoms of a blood clot such as breathing problems; changes in vision; chest pain; severe, sudden headache; pain, swelling, warmth in the leg; trouble speaking; sudden numbness or weakness of the face, arm or leg symptoms of a stroke: change in mental awareness, inability to talk or move one side of the body trouble walking, dizziness, loss of balance or coordination Side effects that usually do not require medical attention (report to yourdoctor or  health care professional if they continue or are bothersome): cold, clammy skin constipation diarrhea headache nausea, vomiting stomach pain unusually slow heartbeat unusually weak or tired This list may not describe all possible side effects. Call your doctor for medical advice about side effects. You may report side effects to FDA at1-800-FDA-1088. Where should I keep my medication? This drug is given in a hospital or clinic and will not be stored at home. NOTE: This sheet is a summary. It may not cover all possible information. If you have questions about this medicine, talk to your doctor, pharmacist, orhealth care provider.  2022 Elsevier/Gold Standard (2019-04-08 11:17:50)  Docetaxel injection What is this medication? DOCETAXEL (doe se TAX el) is a chemotherapy drug. It targets fast dividing cells, like cancer cells, and causes these cells to die. This medicine is used to treat many types of cancers like breast cancer, certain stomach cancers,head and neck cancer, lung cancer, and prostate cancer. This medicine may be used for other purposes; ask your health care provider orpharmacist if you have questions. COMMON BRAND NAME(S): Docefrez, Taxotere What should I tell my care team before I take this medication? They need to know if you have any of these conditions: infection (especially a virus infection such as chickenpox, cold sores, or herpes) liver disease low blood counts, like low white cell, platelet, or red cell counts an unusual or allergic reaction to docetaxel, polysorbate 80, other chemotherapy agents, other medicines, foods, dyes, or preservatives pregnant or trying to get pregnant breast-feeding How should I use this medication? This drug is given as an infusion into a vein. It is administered in a hospitalor clinic by a specially trained health care professional. Talk to your pediatrician regarding the use of this medicine in children.Special care may be  needed. Overdosage: If you think you have taken too much of this medicine contact apoison control center or emergency room at once. NOTE: This medicine is only for you. Do not share this medicine with others. What if I miss a dose? It is important not to miss your dose. Call your doctor or health careprofessional if you are unable to keep an appointment. What may interact with this medication? Do not take this medicine with any of the following medications: live virus vaccines This medicine may also interact with the following medications: aprepitant certain antibiotics like erythromycin or clarithromycin certain antivirals for HIV or hepatitis certain medicines for fungal infections like fluconazole, itraconazole, ketoconazole, posaconazole, or voriconazole cimetidine ciprofloxacin conivaptan cyclosporine dronedarone fluvoxamine grapefruit juice imatinib verapamil This list may not describe all possible interactions. Give your health care provider a list of all the medicines, herbs, non-prescription drugs, or dietary supplements you use. Also tell them if you smoke, drink alcohol, or use illegaldrugs. Some items may interact with your medicine. What should I watch for while using this medication? Your condition will be monitored carefully while you are receiving this medicine. You will need important blood work done while you are taking thismedicine. Call your doctor or health care  professional for advice if you get a fever, chills or sore throat, or other symptoms of a cold or flu. Do not treat yourself. This drug decreases your body's ability to fight infections. Try toavoid being around people who are sick. Some products may contain alcohol. Ask your health care professional if this medicine contains alcohol. Be sure to tell all health care professionals you are taking this medicine. Certain medicines, like metronidazole and disulfiram, can cause an unpleasant reaction when taken with  alcohol. The reaction includes flushing, headache, nausea, vomiting, sweating, and increased thirst. Thereaction can last from 30 minutes to several hours. You may get drowsy or dizzy. Do not drive, use machinery, or do anything that needs mental alertness until you know how this medicine affects you. Do not stand or sit up quickly, especially if you are an older patient. This reduces the risk of dizzy or fainting spells. Alcohol may interfere with the effect ofthis medicine. Talk to your health care professional about your risk of cancer. You may bemore at risk for certain types of cancer if you take this medicine. Do not become pregnant while taking this medicine or for 6 months after stopping it. Women should inform their doctor if they wish to become pregnant or think they might be pregnant. There is a potential for serious side effects to an unborn child. Talk to your health care professional or pharmacist for more information. Do not breast-feed an infant while taking this medicine orfor 1 week after stopping it. Males who get this medicine must use a condom during sex with females who can get pregnant. If you get a woman pregnant, the baby could have birth defects. The baby could die before they are born. You will need to continue wearing a condom for 3 months after stopping the medicine. Tell your health care providerright away if your partner becomes pregnant while you are taking this medicine. This may interfere with the ability to father a child. You should talk to yourdoctor or health care professional if you are concerned about your fertility. What side effects may I notice from receiving this medication? Side effects that you should report to your doctor or health care professionalas soon as possible: allergic reactions like skin rash, itching or hives, swelling of the face, lips, or tongue blurred vision breathing problems changes in vision low blood counts - This drug may decrease the  number of white blood cells, red blood cells and platelets. You may be at increased risk for infections and bleeding. nausea and vomiting pain, redness or irritation at site where injected pain, tingling, numbness in the hands or feet redness, blistering, peeling, or loosening of the skin, including inside the mouth signs of decreased platelets or bleeding - bruising, pinpoint red spots on the skin, black, tarry stools, nosebleeds signs of decreased red blood cells - unusually weak or tired, fainting spells, lightheadedness signs of infection - fever or chills, cough, sore throat, pain or difficulty passing urine swelling of the ankle, feet, hands Side effects that usually do not require medical attention (report to yourdoctor or health care professional if they continue or are bothersome): constipation diarrhea fingernail or toenail changes hair loss loss of appetite mouth sores muscle pain This list may not describe all possible side effects. Call your doctor for medical advice about side effects. You may report side effects to FDA at1-800-FDA-1088. Where should I keep my medication? This drug is given in a hospital or clinic and will not be stored at home.  NOTE: This sheet is a summary. It may not cover all possible information. If you have questions about this medicine, talk to your doctor, pharmacist, orhealth care provider.  2022 Elsevier/Gold Standard (2019-05-11 19:50:31)

## 2020-12-13 NOTE — Progress Notes (Signed)
[facility] OFFICE PROGRESS NOTE   Diagnosis: Non-small cell lung cancer  INTERVAL HISTORY:   Ricky Conway returns as scheduled.  He completed another cycle of docetaxel/ramucirumab 11/22/2020.  He denies nausea/vomiting.  No significant diarrhea.  He denies bleeding.  No neuropathy symptoms.  He feels more short of breath.  No cough or fever.  Objective:  Vital signs in last 24 hours:  Blood pressure 124/74, pulse 90, temperature 98.1 F (36.7 C), temperature source Oral, resp. rate 19, height '5\' 11"'  (1.803 m), weight 181 lb (82.1 kg), SpO2 93 %.    HEENT: No thrush or ulcers. Resp: Distant breath sounds.  No respiratory distress. Cardio: Regular rate and rhythm. GI: Abdomen soft and nontender.  No hepatomegaly. Vascular: Trace firm edema lower leg bilaterally.  Chronic stasis change. Port-A-Cath without erythema.  Lab Results:  Lab Results  Component Value Date   WBC 15.8 (H) 12/13/2020   HGB 15.2 12/13/2020   HCT 45.7 12/13/2020   MCV 96.4 12/13/2020   PLT 230 12/13/2020   NEUTROABS 13.3 (H) 12/13/2020    Imaging:  No results found.  Medications: I have reviewed the patient's current medications.  Assessment/Plan: Pancreas mass, liver lesions Abdominal ultrasound 09/03/2018-3 liver lesions.  Intra-and extrahepatic biliary ductal dilatation. Presentation to the emergency department 09/08/2018 with painless jaundice.  Initial bilirubin returned at 18.3.   MRI abdomen 09/08/2018-1.9 x 2.6 cm hypoenhancing lesion arising exophytically along the posterior aspect of the pancreatic head suspicious for pancreatic adenocarcinoma.  There was suspected involvement of the distal common duct with secondary intrahepatic/extrahepatic ductal dilatation.  Lesion noted to abut the IVC.  Multiple liver metastases noted.  Small upper abdominal/retroperitoneal lymph nodes noted.   ERCP 09/09/2018-high-grade stricture and obstruction of the distal common bile duct.  A metallic  biliary stent was placed.  Brushings in the mid and lower third of the main bile duct were obtained with no malignant cells identified.   Bilirubin improved at 4.7 on 09/10/2018.   Biopsy of a liver lesion on 09/11/2018-metastatic carcinoma, cytokeratin 7 and TTF-1 positive, negative for cytokeratin 20, CDX 2, and PSA.  Foundation 1-microsatellite stable, tumor mutation burden 16, ERBB2 amplification CT chest 09/22/2018- right hilar/mediastinal/retrocrural lymphadenopathy, ill-defined right lower lobe nodularity, emphysema Cycle 1 carboplatin/Alimta/pembrolizumab 10/09/2018 Cycle 2 carboplatin/Alimta/pembrolizumab 10/30/2018 Cycle 3 carboplatin/Alimta/pembrolizumab 11/20/2018 Cycle 4 carboplatin/Alimta/pembrolizumab 12/11/2018 CT 12/26/2018- marked response to therapy of the radical abdominal adenopathy, decreased size of hepatic metastases, no new or progressive disease Cycle 5 Alimta/pembrolizumab 01/01/2019 Cycle 6 Alimta/pembrolizumab 01/23/2019 Cycle 7 Alimta/pembrolizumab 02/12/2019 Cycle 8  Alimta/pembrolizumab 03/05/2019 Cycle 9  Alimta/pembrolizumab 03/26/2019 CTs 04/14/2019-slight interval decrease in size and conspicuity of multiple, irregular low-attenuation liver lesions.  No new liver lesions.  No recurrent mediastinal or hilar lymphadenopathy.  No change in mildly prominent subcentimeter periaortic and peripheral lymph nodes without evidence of recurrent abdominal or pelvic lymphadenopathy. Cycle 10 Alimta/pembrolizumab 04/16/2019 Cycle 11 pembrolizumab 05/07/2019 (Alimta held secondary to increased tearing and leg edema) Cycle 12 Alimta/pembrolizumab 05/28/2019 Cycle 13 Alimta/pembrolizumab 06/18/2019 Restaging CTs 07/08/2019-worsening of low-density hepatic lesions.  Scattered small lymph nodes with increasing conspicuity, largest along the left periaortic chain approximately 9 mm previously approximately 5 mm. Cycle 14 Alimta/pembrolizumab 07/09/2019 Cycle 15 Alimta/pembrolizumab 07/31/2019 Cycle  16 Alimta/pembrolizumab 08/20/2019 Rising CEA beginning December 2020 CTs 09/08/2019-enlargement of liver metastases, mild enlargement of abdominal/retroperitoneal and lower thoracic nodes, nonspecific pulmonary nodules-stable Cycle 1 gemcitabine 09/17/2019 Cycle 2 gemcitabine 10/01/2019 Cycle 3 gemcitabine 10/15/2019 Cycle 4 gemcitabine 10/29/2019 Cycle 5 gemcitabine 11/12/2019 Rising CEA 10/2019  CTs 11/19/19 mixed response in liver, stable pulmonary nodules, thoracic and RP nodes, overall stable Plan to intensify chemo carboplatin AUC 4 plus gemcitabine given on day 1 q21 days starting ~12/03/19   Admission 11/30/2019 with recurrent biliary obstruction-ERCP 12/02/2019-obstructed biliary stent, new stent placed Cycle 1 gemcitabine/carboplatin 12/24/2019 Cycle 2 gemcitabine/carboplatin 01/14/2020 Cycle 3 gemcitabine/carboplatin 02/04/2020 Cycle 4 gemcitabine/carboplatin 02/25/2020 CTs 03/15/2020-decrease in size of hepatic metastatic lesions. No new liver lesions. New sclerotic lesions in the thoracic and lumbar spine. Stable small pulmonary nodules. Asymmetric wall thickening of the medial wall of the gallbladder new since previous exam without signs of inflammation. Cycle 5 gemcitabine/carboplatin 03/17/2020 Cycle 6 gemcitabine/carboplatin 04/15/2020 Cycle 7 gemcitabine/carboplatin 05/09/2020 Cycle 8 gemcitabine/carboplatin 05/31/2020 CTs 06/16/2020-unchanged sclerotic bone lesions, increase in size and number of liver metastases Cycle 1 docetaxel/ramucirumab 06/27/2020 Cycle 2 docetaxel/ramucirumab 07/18/2020 Cycle 3 docetaxel/ramucirumab 08/08/2020 Cycle 4 docetaxel/ramucirumab 08/29/2020 CT 09/16/2020-decreased size of liver lesions, stable bone metastases Cycle 5 docetaxel/ramucirumab 09/19/2020 Cycle 6 docetaxel/ramucirumab 10/10/2020 Cycle 7 docetaxel/ramucirumab 11/01/2020 Cycle 8 docetaxel/ramucirumab 11/22/2020 CT abdomen/pelvis 12/09/2020-hepatic and osseous metastatic disease unchanged. Cycle 9  docetaxel/ramucirumab 12/13/2020 COPD Hypertension Trace edema right leg 12/11/2018. Doppler negative for DVT 12/12/2018. Watery eyes, runny nose reported 12/11/2018.  Question related to Alimta.  Persistent watery eyes 04/16/2019.  Ophthalmology evaluation 05/11/2019-blepharitis,ectropion left lower lid, macular degeneration Admission 12/24/2018 with a fever- no source for infection identified, completed outpatient course of ciprofloxacin Port-A-Cath placement 09/14/2019, interventional radiology Colonoscopy 06/16/2015-diverticulosis in the sigmoid colon, examination otherwise normal  Disposition: Ricky Conway appears unchanged.  He has completed 8 cycles of docetaxel/ramucirumab.  Restaging CTs show stable disease involving liver and bone.  Dr. Benay Spice reviewed the results with him at today's visit.  The recommendation is to continue docetaxel/ramucirumab.  Mr. Adelson agrees with this plan, cycle 9 today.  We reviewed the CBC and chemistry panel from today.  Labs adequate to proceed as above.  He will return for lab, follow-up, docetaxel/ramucirumab in 3 weeks.  Patient seen with Dr. Benay Spice.      Ned Card ANP/GNP-BC   12/13/2020  10:02 AM This was a shared visit with Ned Card.  We discussed the CT findings with Mr. Reading.  I reviewed the CT images.  There is no clinical or radiologic evidence of disease progression.  The CEA is lower.  I recommend continuing docetaxel/ramucirumab.  He agrees.   I was present for greater than 50% of today's visit.  I performed medical decision making.  Julieanne Manson, MD

## 2020-12-15 ENCOUNTER — Other Ambulatory Visit: Payer: Self-pay

## 2020-12-15 ENCOUNTER — Inpatient Hospital Stay: Payer: Medicare Other

## 2020-12-15 VITALS — BP 129/81 | HR 93 | Temp 98.0°F | Resp 18

## 2020-12-15 DIAGNOSIS — C3490 Malignant neoplasm of unspecified part of unspecified bronchus or lung: Secondary | ICD-10-CM

## 2020-12-15 DIAGNOSIS — C787 Secondary malignant neoplasm of liver and intrahepatic bile duct: Secondary | ICD-10-CM | POA: Diagnosis not present

## 2020-12-15 MED ORDER — PEGFILGRASTIM-CBQV 6 MG/0.6ML ~~LOC~~ SOSY
6.0000 mg | PREFILLED_SYRINGE | Freq: Once | SUBCUTANEOUS | Status: AC
  Administered 2020-12-15: 6 mg via SUBCUTANEOUS

## 2020-12-15 NOTE — Patient Instructions (Signed)
[facility]  Discharge Instructions: Thank you for choosing [facility] to provide your oncology and hematology care.   If you have a lab appointment with the [facility], please go directly to the [facility] and check in at the registration area.   Wear comfortable clothing and clothing appropriate for easy access to any Portacath or PICC line.   We strive to give you quality time with your provider. You may need to reschedule your appointment if you arrive late (15 or more minutes).  Arriving late affects you and other patients whose appointments are after yours.  Also, if you miss three or more appointments without notifying the office, you may be dismissed from the clinic at the provider's discretion.      For prescription refill requests, have your pharmacy contact our office and allow 72 hours for refills to be completed.    Today you received the following chemotherapy and/or immunotherapy agents Udenyca      To help prevent nausea and vomiting after your treatment, we encourage you to take your nausea medication as directed.  BELOW ARE SYMPTOMS THAT SHOULD BE REPORTED IMMEDIATELY: *FEVER GREATER THAN 100.4 F (38 C) OR HIGHER *CHILLS OR SWEATING *NAUSEA AND VOMITING THAT IS NOT CONTROLLED WITH YOUR NAUSEA MEDICATION *UNUSUAL SHORTNESS OF BREATH *UNUSUAL BRUISING OR BLEEDING *URINARY PROBLEMS (pain or burning when urinating, or frequent urination) *BOWEL PROBLEMS (unusual diarrhea, constipation, pain near the anus) TENDERNESS IN MOUTH AND THROAT WITH OR WITHOUT PRESENCE OF ULCERS (sore throat, sores in mouth, or a toothache) UNUSUAL RASH, SWELLING OR PAIN  UNUSUAL VAGINAL DISCHARGE OR ITCHING   Items with * indicate a potential emergency and should be followed up as soon as possible or go to the Emergency Department if any problems should occur.  Please show the CHEMOTHERAPY ALERT CARD or IMMUNOTHERAPY ALERT CARD at check-in to the  Emergency Department and triage nurse.  Should you have questions after your visit or need to cancel or reschedule your appointment, please contact [facility]  Dept: [phone]  and follow the prompts.  Office hours are 8:00 a.m. to 4:30 p.m. Monday - Friday. Please note that voicemails left after 4:00 p.m. may not be returned until the following business day.  We are closed weekends and major holidays. You have access to a nurse at all times for urgent questions. Please call the main number to the clinic Dept: [phone] and follow the prompts.   For any non-urgent questions, you may also contact your provider using MyChart. We now offer e-Visits for anyone 30 and older to request care online for non-urgent symptoms. For details visit [email or web address].   Also download the MyChart app! Go to the app store, search "MyChart", open the app, select [facility], and log in with your MyChart username and password.  Due to Covid, a mask is required upon entering the hospital/clinic. If you do not have a mask, one will be given to you upon arrival. For doctor visits, patients may have 1 support person aged 60 or older with them. For treatment visits, patients cannot have anyone with them due to current Covid guidelines and our immunocompromised population.

## 2021-01-01 ENCOUNTER — Other Ambulatory Visit: Payer: Self-pay | Admitting: Oncology

## 2021-01-04 ENCOUNTER — Inpatient Hospital Stay: Payer: Medicare Other | Attending: Oncology

## 2021-01-04 ENCOUNTER — Inpatient Hospital Stay: Payer: Medicare Other

## 2021-01-04 ENCOUNTER — Inpatient Hospital Stay (HOSPITAL_BASED_OUTPATIENT_CLINIC_OR_DEPARTMENT_OTHER): Payer: Medicare Other | Admitting: Oncology

## 2021-01-04 ENCOUNTER — Other Ambulatory Visit: Payer: Self-pay

## 2021-01-04 VITALS — BP 139/84 | HR 92 | Temp 97.3°F | Resp 18 | Wt 177.4 lb

## 2021-01-04 DIAGNOSIS — C3431 Malignant neoplasm of lower lobe, right bronchus or lung: Secondary | ICD-10-CM | POA: Diagnosis present

## 2021-01-04 DIAGNOSIS — C3490 Malignant neoplasm of unspecified part of unspecified bronchus or lung: Secondary | ICD-10-CM

## 2021-01-04 DIAGNOSIS — Z5111 Encounter for antineoplastic chemotherapy: Secondary | ICD-10-CM | POA: Insufficient documentation

## 2021-01-04 DIAGNOSIS — Z79899 Other long term (current) drug therapy: Secondary | ICD-10-CM | POA: Diagnosis not present

## 2021-01-04 DIAGNOSIS — C787 Secondary malignant neoplasm of liver and intrahepatic bile duct: Secondary | ICD-10-CM | POA: Insufficient documentation

## 2021-01-04 DIAGNOSIS — R5383 Other fatigue: Secondary | ICD-10-CM

## 2021-01-04 DIAGNOSIS — Z95828 Presence of other vascular implants and grafts: Secondary | ICD-10-CM

## 2021-01-04 DIAGNOSIS — Z5112 Encounter for antineoplastic immunotherapy: Secondary | ICD-10-CM | POA: Insufficient documentation

## 2021-01-04 LAB — CBC WITH DIFFERENTIAL (CANCER CENTER ONLY)
Abs Immature Granulocytes: 0.04 10*3/uL (ref 0.00–0.07)
Basophils Absolute: 0.1 10*3/uL (ref 0.0–0.1)
Basophils Relative: 1 %
Eosinophils Absolute: 0 10*3/uL (ref 0.0–0.5)
Eosinophils Relative: 0 %
HCT: 46.9 % (ref 39.0–52.0)
Hemoglobin: 15.5 g/dL (ref 13.0–17.0)
Immature Granulocytes: 0 %
Lymphocytes Relative: 8 %
Lymphs Abs: 0.9 10*3/uL (ref 0.7–4.0)
MCH: 31.9 pg (ref 26.0–34.0)
MCHC: 33 g/dL (ref 30.0–36.0)
MCV: 96.5 fL (ref 80.0–100.0)
Monocytes Absolute: 1.1 10*3/uL — ABNORMAL HIGH (ref 0.1–1.0)
Monocytes Relative: 9 %
Neutro Abs: 9.4 10*3/uL — ABNORMAL HIGH (ref 1.7–7.7)
Neutrophils Relative %: 82 %
Platelet Count: 190 10*3/uL (ref 150–400)
RBC: 4.86 MIL/uL (ref 4.22–5.81)
RDW: 16.9 % — ABNORMAL HIGH (ref 11.5–15.5)
WBC Count: 11.5 10*3/uL — ABNORMAL HIGH (ref 4.0–10.5)
nRBC: 0 % (ref 0.0–0.2)

## 2021-01-04 LAB — CMP (CANCER CENTER ONLY)
ALT: 23 U/L (ref 0–44)
AST: 31 U/L (ref 15–41)
Albumin: 3.4 g/dL — ABNORMAL LOW (ref 3.5–5.0)
Alkaline Phosphatase: 109 U/L (ref 38–126)
Anion gap: 7 (ref 5–15)
BUN: 8 mg/dL (ref 8–23)
CO2: 30 mmol/L (ref 22–32)
Calcium: 9 mg/dL (ref 8.9–10.3)
Chloride: 97 mmol/L — ABNORMAL LOW (ref 98–111)
Creatinine: 0.43 mg/dL — ABNORMAL LOW (ref 0.61–1.24)
GFR, Estimated: >60 mL/min (ref >60)
Glucose, Bld: 97 mg/dL (ref 70–99)
Potassium: 3.9 mmol/L (ref 3.5–5.1)
Sodium: 134 mmol/L — ABNORMAL LOW (ref 135–145)
Total Bilirubin: 0.8 mg/dL (ref 0.3–1.2)
Total Protein: 5.8 g/dL — ABNORMAL LOW (ref 6.5–8.1)

## 2021-01-04 LAB — TSH: TSH: 6.872 u[IU]/mL — ABNORMAL HIGH (ref 0.350–4.500)

## 2021-01-04 LAB — CEA (ACCESS): CEA (CHCC): 4876.92 ng/mL — ABNORMAL HIGH (ref 0.00–5.00)

## 2021-01-04 MED ORDER — SODIUM CHLORIDE 0.9% FLUSH
10.0000 mL | INTRAVENOUS | Status: DC | PRN
  Administered 2021-01-04: 10 mL
  Filled 2021-01-04: qty 10

## 2021-01-04 MED ORDER — HEPARIN SOD (PORK) LOCK FLUSH 100 UNIT/ML IV SOLN
500.0000 [IU] | Freq: Once | INTRAVENOUS | Status: DC | PRN
  Filled 2021-01-04: qty 5

## 2021-01-04 MED ORDER — SODIUM CHLORIDE 0.9 % IV SOLN
60.0000 mg/m2 | Freq: Once | INTRAVENOUS | Status: AC
  Administered 2021-01-04: 120 mg via INTRAVENOUS
  Filled 2021-01-04: qty 12

## 2021-01-04 MED ORDER — DIPHENHYDRAMINE HCL 50 MG/ML IJ SOLN
25.0000 mg | Freq: Once | INTRAMUSCULAR | Status: AC
  Administered 2021-01-04: 25 mg via INTRAVENOUS
  Filled 2021-01-04: qty 1

## 2021-01-04 MED ORDER — SODIUM CHLORIDE 0.9 % IV SOLN
10.0000 mg/kg | Freq: Once | INTRAVENOUS | Status: AC
  Administered 2021-01-04: 800 mg via INTRAVENOUS
  Filled 2021-01-04: qty 30

## 2021-01-04 MED ORDER — SODIUM CHLORIDE 0.9 % IV SOLN
Freq: Once | INTRAVENOUS | Status: AC
  Filled 2021-01-04: qty 250

## 2021-01-04 MED ORDER — HEPARIN SOD (PORK) LOCK FLUSH 100 UNIT/ML IV SOLN
500.0000 [IU] | Freq: Once | INTRAVENOUS | Status: AC | PRN
  Administered 2021-01-04: 500 [IU]
  Filled 2021-01-04: qty 5

## 2021-01-04 MED ORDER — SODIUM CHLORIDE 0.9 % IV SOLN
10.0000 mg | Freq: Once | INTRAVENOUS | Status: AC
  Administered 2021-01-04: 10 mg via INTRAVENOUS
  Filled 2021-01-04: qty 1

## 2021-01-04 MED ORDER — ACETAMINOPHEN 325 MG PO TABS
650.0000 mg | ORAL_TABLET | Freq: Once | ORAL | Status: AC
  Administered 2021-01-04: 650 mg via ORAL
  Filled 2021-01-04: qty 2

## 2021-01-04 NOTE — Patient Instructions (Signed)
[facility]  Discharge Instructions: Thank you for choosing [facility] to provide your oncology and hematology care.   If you have a lab appointment with the [facility], please go directly to the [facility] and check in at the registration area.   Wear comfortable clothing and clothing appropriate for easy access to any Portacath or PICC line.   We strive to give you quality time with your provider. You may need to reschedule your appointment if you arrive late (15 or more minutes).  Arriving late affects you and other patients whose appointments are after yours.  Also, if you miss three or more appointments without notifying the office, you may be dismissed from the clinic at the provider's discretion.      For prescription refill requests, have your pharmacy contact our office and allow 72 hours for refills to be completed.    Today you received the following chemotherapy and/or immunotherapy agents  ramucirumab, docetaxel   To help prevent nausea and vomiting after your treatment, we encourage you to take your nausea medication as directed.  BELOW ARE SYMPTOMS THAT SHOULD BE REPORTED IMMEDIATELY: *FEVER GREATER THAN 100.4 F (38 C) OR HIGHER *CHILLS OR SWEATING *NAUSEA AND VOMITING THAT IS NOT CONTROLLED WITH YOUR NAUSEA MEDICATION *UNUSUAL SHORTNESS OF BREATH *UNUSUAL BRUISING OR BLEEDING *URINARY PROBLEMS (pain or burning when urinating, or frequent urination) *BOWEL PROBLEMS (unusual diarrhea, constipation, pain near the anus) TENDERNESS IN MOUTH AND THROAT WITH OR WITHOUT PRESENCE OF ULCERS (sore throat, sores in mouth, or a toothache) UNUSUAL RASH, SWELLING OR PAIN  UNUSUAL VAGINAL DISCHARGE OR ITCHING   Items with * indicate a potential emergency and should be followed up as soon as possible or go to the Emergency Department if any problems should occur.  Please show the CHEMOTHERAPY ALERT CARD or IMMUNOTHERAPY ALERT CARD at  check-in to the Emergency Department and triage nurse.  Should you have questions after your visit or need to cancel or reschedule your appointment, please contact [facility]  Dept: [phone]  and follow the prompts.  Office hours are 8:00 a.m. to 4:30 p.m. Monday - Friday. Please note that voicemails left after 4:00 p.m. may not be returned until the following business day.  We are closed weekends and major holidays. You have access to a nurse at all times for urgent questions. Please call the main number to the clinic Dept: [phone] and follow the prompts.   For any non-urgent questions, you may also contact your provider using MyChart. We now offer e-Visits for anyone 53 and older to request care online for non-urgent symptoms. For details visit [email or web address].   Also download the MyChart app! Go to the app store, search "MyChart", open the app, select [facility], and log in with your MyChart username and password.  Due to Covid, a mask is required upon entering the hospital/clinic. If you do not have a mask, one will be given to you upon arrival. For doctor visits, patients may have 1 support person aged 35 or older with them. For treatment visits, patients cannot have anyone with them due to current Covid guidelines and our immunocompromised population.   Ramucirumab injection What is this medication? RAMUCIRUMAB (ra mue SIR ue mab) is a monoclonal antibody. It is used to treatstomach cancer, colorectal cancer, liver cancer, and lung cancer. This medicine may be used for other purposes; ask your health care provider orpharmacist if you have questions. COMMON BRAND NAME(S):  Cyramza What should I tell my care team before I take this medication? They need to know if you have any of these conditions: bleeding disorders blood clots heart disease, including heart failure, heart attack, or chest pain (angina) high blood pressure infection (especially a  virus infection such as chickenpox, cold sores, or herpes) protein in your urine recent or planning to have surgery stroke an unusual or allergic reaction to ramucirumab, other medicines, foods, dyes, or preservatives pregnant or trying to get pregnant breast-feeding How should I use this medication? This medicine is for infusion into a vein. It is given by a health careprofessional in a hospital or clinic setting. Talk to your pediatrician regarding the use of this medicine in children.Special care may be needed. Overdosage: If you think you have taken too much of this medicine contact apoison control center or emergency room at once. NOTE: This medicine is only for you. Do not share this medicine with others. What if I miss a dose? It is important not to miss your dose. Call your doctor or health careprofessional if you are unable to keep an appointment. What may interact with this medication? Interactions have not been studied. This list may not describe all possible interactions. Give your health care provider a list of all the medicines, herbs, non-prescription drugs, or dietary supplements you use. Also tell them if you smoke, drink alcohol, or use illegaldrugs. Some items may interact with your medicine. What should I watch for while using this medication? Your condition will be monitored carefully while you are receiving this medicine. You will need to to check your blood pressure and have your blood andurine tested while you are taking this medicine. Your condition will be monitored carefully while you are receiving thismedicine. This medicine may increase your risk to bruise or bleed. Call your doctor orhealth care professional if you notice any unusual bleeding. Before having surgery, talk to your health care provider to make sure it is ok. This drug can increase the risk of poor healing of your surgical site or wound. You will need to stop this drug for 28 days before surgery. After  surgery, wait at least 2 weeks before restarting this drug. Make sure the surgical site or wound is healed enough before restarting this drug. Talk to your health careprovider if questions. Do not become pregnant while taking this medicine or for 3 months after stopping it. Women should inform their doctor if they wish to become pregnant or think they might be pregnant. There is a potential for serious side effects to an unborn child. Talk to your health care professional or pharmacist for more information. Do not breast-feed an infant while taking this medicine orfor 2 months after stopping it. This medicine may interfere with the ability to have a child. Talk with yourdoctor or health care professional if you are concerned about your fertility. What side effects may I notice from receiving this medication? Side effects that you should report to your doctor or health care professionalas soon as possible: allergic reactions like skin rash, itching or hives, breathing problems, swelling of the face, lips, or tongue signs of infection - fever or chills, cough, sore throat chest pain or chest tightness confusion dizziness feeling faint or lightheaded, falls severe abdominal pain severe nausea, vomiting signs and symptoms of bleeding such as bloody or black, tarry stools; red or dark-brown urine; spitting up blood or brown material that looks like coffee grounds; red spots on the skin; unusual bruising or bleeding  from the eye, gums, or nose signs and symptoms of a blood clot such as breathing problems; changes in vision; chest pain; severe, sudden headache; pain, swelling, warmth in the leg; trouble speaking; sudden numbness or weakness of the face, arm or leg symptoms of a stroke: change in mental awareness, inability to talk or move one side of the body trouble walking, dizziness, loss of balance or coordination Side effects that usually do not require medical attention (report to yourdoctor or  health care professional if they continue or are bothersome): cold, clammy skin constipation diarrhea headache nausea, vomiting stomach pain unusually slow heartbeat unusually weak or tired This list may not describe all possible side effects. Call your doctor for medical advice about side effects. You may report side effects to FDA at1-800-FDA-1088. Where should I keep my medication? This drug is given in a hospital or clinic and will not be stored at home. NOTE: This sheet is a summary. It may not cover all possible information. If you have questions about this medicine, talk to your doctor, pharmacist, orhealth care provider.  2022 Elsevier/Gold Standard (2019-04-08 11:17:50)  Docetaxel injection What is this medication? DOCETAXEL (doe se TAX el) is a chemotherapy drug. It targets fast dividing cells, like cancer cells, and causes these cells to die. This medicine is used to treat many types of cancers like breast cancer, certain stomach cancers,head and neck cancer, lung cancer, and prostate cancer. This medicine may be used for other purposes; ask your health care provider orpharmacist if you have questions. COMMON BRAND NAME(S): Docefrez, Taxotere What should I tell my care team before I take this medication? They need to know if you have any of these conditions: infection (especially a virus infection such as chickenpox, cold sores, or herpes) liver disease low blood counts, like low white cell, platelet, or red cell counts an unusual or allergic reaction to docetaxel, polysorbate 80, other chemotherapy agents, other medicines, foods, dyes, or preservatives pregnant or trying to get pregnant breast-feeding How should I use this medication? This drug is given as an infusion into a vein. It is administered in a hospitalor clinic by a specially trained health care professional. Talk to your pediatrician regarding the use of this medicine in children.Special care may be  needed. Overdosage: If you think you have taken too much of this medicine contact apoison control center or emergency room at once. NOTE: This medicine is only for you. Do not share this medicine with others. What if I miss a dose? It is important not to miss your dose. Call your doctor or health careprofessional if you are unable to keep an appointment. What may interact with this medication? Do not take this medicine with any of the following medications: live virus vaccines This medicine may also interact with the following medications: aprepitant certain antibiotics like erythromycin or clarithromycin certain antivirals for HIV or hepatitis certain medicines for fungal infections like fluconazole, itraconazole, ketoconazole, posaconazole, or voriconazole cimetidine ciprofloxacin conivaptan cyclosporine dronedarone fluvoxamine grapefruit juice imatinib verapamil This list may not describe all possible interactions. Give your health care provider a list of all the medicines, herbs, non-prescription drugs, or dietary supplements you use. Also tell them if you smoke, drink alcohol, or use illegaldrugs. Some items may interact with your medicine. What should I watch for while using this medication? Your condition will be monitored carefully while you are receiving this medicine. You will need important blood work done while you are taking thismedicine. Call your doctor or health care  professional for advice if you get a fever, chills or sore throat, or other symptoms of a cold or flu. Do not treat yourself. This drug decreases your body's ability to fight infections. Try toavoid being around people who are sick. Some products may contain alcohol. Ask your health care professional if this medicine contains alcohol. Be sure to tell all health care professionals you are taking this medicine. Certain medicines, like metronidazole and disulfiram, can cause an unpleasant reaction when taken with  alcohol. The reaction includes flushing, headache, nausea, vomiting, sweating, and increased thirst. Thereaction can last from 30 minutes to several hours. You may get drowsy or dizzy. Do not drive, use machinery, or do anything that needs mental alertness until you know how this medicine affects you. Do not stand or sit up quickly, especially if you are an older patient. This reduces the risk of dizzy or fainting spells. Alcohol may interfere with the effect ofthis medicine. Talk to your health care professional about your risk of cancer. You may bemore at risk for certain types of cancer if you take this medicine. Do not become pregnant while taking this medicine or for 6 months after stopping it. Women should inform their doctor if they wish to become pregnant or think they might be pregnant. There is a potential for serious side effects to an unborn child. Talk to your health care professional or pharmacist for more information. Do not breast-feed an infant while taking this medicine orfor 1 week after stopping it. Males who get this medicine must use a condom during sex with females who can get pregnant. If you get a woman pregnant, the baby could have birth defects. The baby could die before they are born. You will need to continue wearing a condom for 3 months after stopping the medicine. Tell your health care providerright away if your partner becomes pregnant while you are taking this medicine. This may interfere with the ability to father a child. You should talk to yourdoctor or health care professional if you are concerned about your fertility. What side effects may I notice from receiving this medication? Side effects that you should report to your doctor or health care professionalas soon as possible: allergic reactions like skin rash, itching or hives, swelling of the face, lips, or tongue blurred vision breathing problems changes in vision low blood counts - This drug may decrease the  number of white blood cells, red blood cells and platelets. You may be at increased risk for infections and bleeding. nausea and vomiting pain, redness or irritation at site where injected pain, tingling, numbness in the hands or feet redness, blistering, peeling, or loosening of the skin, including inside the mouth signs of decreased platelets or bleeding - bruising, pinpoint red spots on the skin, black, tarry stools, nosebleeds signs of decreased red blood cells - unusually weak or tired, fainting spells, lightheadedness signs of infection - fever or chills, cough, sore throat, pain or difficulty passing urine swelling of the ankle, feet, hands Side effects that usually do not require medical attention (report to yourdoctor or health care professional if they continue or are bothersome): constipation diarrhea fingernail or toenail changes hair loss loss of appetite mouth sores muscle pain This list may not describe all possible side effects. Call your doctor for medical advice about side effects. You may report side effects to FDA at1-800-FDA-1088. Where should I keep my medication? This drug is given in a hospital or clinic and will not be stored at home.  NOTE: This sheet is a summary. It may not cover all possible information. If you have questions about this medicine, talk to your doctor, pharmacist, orhealth care provider.  2022 Elsevier/Gold Standard (2019-05-11 19:50:31)

## 2021-01-04 NOTE — Progress Notes (Signed)
[place] OFFICE PROGRESS NOTE   Diagnosis: Metastatic carcinoma  INTERVAL HISTORY:   Mr. Suzan Slick completed another cycle of docetaxel/ramucirumab on 12/13/2020.  No nausea, mouth sores, or swelling.  He has noted increased dyspnea, but he is able to perform his usual activities.  No fever.  He uses oxygen as needed.  Objective:  Vital signs in last 24 hours:  Blood pressure 139/84, pulse 92, temperature (!) 97.3 F (36.3 C), temperature source Oral, resp. rate 18, weight 177 lb 6 oz (80.5 kg), SpO2 98 %.    HEENT: No thrush or ulcers Resp: Distant breath sounds, no wheezing, no respiratory distress Cardio: Regular rate and rhythm GI: No hepatosplenomegaly Vascular: No leg edema, chronic stasis change   Skin: Dry superficial desquamation at the lower legs, and left thumb.  There are is a crusted mole at the left trochanter  Portacath/PICC-without erythema  Lab Results:  Lab Results  Component Value Date   WBC 11.5 (H) 01/04/2021   HGB 15.5 01/04/2021   HCT 46.9 01/04/2021   MCV 96.5 01/04/2021   PLT 190 01/04/2021   NEUTROABS 9.4 (H) 01/04/2021    CMP  Lab Results  Component Value Date   NA 134 (L) 01/04/2021   K 3.9 01/04/2021   CL 97 (L) 01/04/2021   CO2 30 01/04/2021   GLUCOSE 97 01/04/2021   BUN 8 01/04/2021   CREATININE 0.43 (L) 01/04/2021   CALCIUM 9.0 01/04/2021   PROT 5.8 (L) 01/04/2021   ALBUMIN 3.4 (L) 01/04/2021   AST 31 01/04/2021   ALT 23 01/04/2021   ALKPHOS 109 01/04/2021   BILITOT 0.8 01/04/2021   GFRNONAA >60 01/04/2021   GFRAA >60 03/15/2020    Lab Results  Component Value Date   CEA1 3,072.50 (H) 11/22/2020   CEA 4,967.59 (H) 12/13/2020   FMB846 118 (H) 09/08/2018    No results found.  Medications: I have reviewed the patient's current medications.   Assessment/Plan:  Pancreas mass, liver lesions Abdominal ultrasound 09/03/2018-3 liver lesions.  Intra-and extrahepatic biliary ductal dilatation. Presentation  to the emergency department 09/08/2018 with painless jaundice.  Initial bilirubin returned at 18.3.   MRI abdomen 09/08/2018-1.9 x 2.6 cm hypoenhancing lesion arising exophytically along the posterior aspect of the pancreatic head suspicious for pancreatic adenocarcinoma.  There was suspected involvement of the distal common duct with secondary intrahepatic/extrahepatic ductal dilatation.  Lesion noted to abut the IVC.  Multiple liver metastases noted.  Small upper abdominal/retroperitoneal lymph nodes noted.   ERCP 09/09/2018-high-grade stricture and obstruction of the distal common bile duct.  A metallic biliary stent was placed.  Brushings in the mid and lower third of the main bile duct were obtained with no malignant cells identified.   Bilirubin improved at 4.7 on 09/10/2018.   Biopsy of a liver lesion on 09/11/2018-metastatic carcinoma, cytokeratin 7 and TTF-1 positive, negative for cytokeratin 20, CDX 2, and PSA.  Foundation 1-microsatellite stable, tumor mutation burden 16, ERBB2 amplification CT chest 09/22/2018- right hilar/mediastinal/retrocrural lymphadenopathy, ill-defined right lower lobe nodularity, emphysema Cycle 1 carboplatin/Alimta/pembrolizumab 10/09/2018 Cycle 2 carboplatin/Alimta/pembrolizumab 10/30/2018 Cycle 3 carboplatin/Alimta/pembrolizumab 11/20/2018 Cycle 4 carboplatin/Alimta/pembrolizumab 12/11/2018 CT 12/26/2018- marked response to therapy of the radical abdominal adenopathy, decreased size of hepatic metastases, no new or progressive disease Cycle 5 Alimta/pembrolizumab 01/01/2019 Cycle 6 Alimta/pembrolizumab 01/23/2019 Cycle 7 Alimta/pembrolizumab 02/12/2019 Cycle 8  Alimta/pembrolizumab 03/05/2019 Cycle 9  Alimta/pembrolizumab 03/26/2019 CTs 04/14/2019-slight interval decrease in size and conspicuity of multiple, irregular low-attenuation liver lesions.  No new liver lesions.  No recurrent  mediastinal or hilar lymphadenopathy.  No change in mildly prominent subcentimeter periaortic and  peripheral lymph nodes without evidence of recurrent abdominal or pelvic lymphadenopathy. Cycle 10 Alimta/pembrolizumab 04/16/2019 Cycle 11 pembrolizumab 05/07/2019 (Alimta held secondary to increased tearing and leg edema) Cycle 12 Alimta/pembrolizumab 05/28/2019 Cycle 13 Alimta/pembrolizumab 06/18/2019 Restaging CTs 07/08/2019-worsening of low-density hepatic lesions.  Scattered small lymph nodes with increasing conspicuity, largest along the left periaortic chain approximately 9 mm previously approximately 5 mm. Cycle 14 Alimta/pembrolizumab 07/09/2019 Cycle 15 Alimta/pembrolizumab 07/31/2019 Cycle 16 Alimta/pembrolizumab 08/20/2019 Rising CEA beginning December 2020 CTs 09/08/2019-enlargement of liver metastases, mild enlargement of abdominal/retroperitoneal and lower thoracic nodes, nonspecific pulmonary nodules-stable Cycle 1 gemcitabine 09/17/2019 Cycle 2 gemcitabine 10/01/2019 Cycle 3 gemcitabine 10/15/2019 Cycle 4 gemcitabine 10/29/2019 Cycle 5 gemcitabine 11/12/2019 Rising CEA 10/2019 CTs 11/19/19 mixed response in liver, stable pulmonary nodules, thoracic and RP nodes, overall stable Plan to intensify chemo carboplatin AUC 4 plus gemcitabine given on day 1 q21 days starting ~12/03/19   Admission 11/30/2019 with recurrent biliary obstruction-ERCP 12/02/2019-obstructed biliary stent, new stent placed Cycle 1 gemcitabine/carboplatin 12/24/2019 Cycle 2 gemcitabine/carboplatin 01/14/2020 Cycle 3 gemcitabine/carboplatin 02/04/2020 Cycle 4 gemcitabine/carboplatin 02/25/2020 CTs 03/15/2020-decrease in size of hepatic metastatic lesions. No new liver lesions. New sclerotic lesions in the thoracic and lumbar spine. Stable small pulmonary nodules. Asymmetric wall thickening of the medial wall of the gallbladder new since previous exam without signs of inflammation. Cycle 5 gemcitabine/carboplatin 03/17/2020 Cycle 6 gemcitabine/carboplatin 04/15/2020 Cycle 7 gemcitabine/carboplatin 05/09/2020 Cycle 8  gemcitabine/carboplatin 05/31/2020 CTs 06/16/2020-unchanged sclerotic bone lesions, increase in size and number of liver metastases Cycle 1 docetaxel/ramucirumab 06/27/2020 Cycle 2 docetaxel/ramucirumab 07/18/2020 Cycle 3 docetaxel/ramucirumab 08/08/2020 Cycle 4 docetaxel/ramucirumab 08/29/2020 CT 09/16/2020-decreased size of liver lesions, stable bone metastases Cycle 5 docetaxel/ramucirumab 09/19/2020 Cycle 6 docetaxel/ramucirumab 10/10/2020 Cycle 7 docetaxel/ramucirumab 11/01/2020 Cycle 8 docetaxel/ramucirumab 11/22/2020 CT abdomen/pelvis 12/09/2020-hepatic and osseous metastatic disease unchanged. Cycle 9 docetaxel/ramucirumab 12/13/2020 Cycle 10 docetaxel/ramucirumab 01/04/2021 COPD Hypertension Trace edema right leg 12/11/2018. Doppler negative for DVT 12/12/2018. Watery eyes, runny nose reported 12/11/2018.  Question related to Alimta.  Persistent watery eyes 04/16/2019.  Ophthalmology evaluation 05/11/2019-blepharitis,ectropion left lower lid, macular degeneration Admission 12/24/2018 with a fever- no source for infection identified, completed outpatient course of ciprofloxacin Port-A-Cath placement 09/14/2019, interventional radiology Colonoscopy 06/16/2015-diverticulosis in the sigmoid colon, examination otherwise normal   Disposition: Ricky Conway appears unchanged.  He is tolerating the docetaxel/ramucirumab well.  He will complete another cycle today.  The CEA has been slightly improved over the past few months.  We will plan for a restaging CT in September or October.  He plans to obtain a COVID-19 booster vaccine this week.  He will return for an office visit and treatment in 3 weeks.  [name], MD  01/04/2021  11:03 AM

## 2021-01-04 NOTE — Patient Instructions (Signed)
[facility]  Discharge Instructions: Thank you for choosing [facility] to provide your oncology and hematology care.   If you have a lab appointment with the [facility], please go directly to the [facility] and check in at the registration area.   Wear comfortable clothing and clothing appropriate for easy access to any Portacath or PICC line.   We strive to give you quality time with your provider. You may need to reschedule your appointment if you arrive late (15 or more minutes).  Arriving late affects you and other patients whose appointments are after yours.  Also, if you miss three or more appointments without notifying the office, you may be dismissed from the clinic at the provider's discretion.      For prescription refill requests, have your pharmacy contact our office and allow 72 hours for refills to be completed.    Today you received the following chemotherapy and/or immunotherapy agents  ramucirumab, docetaxel   To help prevent nausea and vomiting after your treatment, we encourage you to take your nausea medication as directed.  BELOW ARE SYMPTOMS THAT SHOULD BE REPORTED IMMEDIATELY: *FEVER GREATER THAN 100.4 F (38 C) OR HIGHER *CHILLS OR SWEATING *NAUSEA AND VOMITING THAT IS NOT CONTROLLED WITH YOUR NAUSEA MEDICATION *UNUSUAL SHORTNESS OF BREATH *UNUSUAL BRUISING OR BLEEDING *URINARY PROBLEMS (pain or burning when urinating, or frequent urination) *BOWEL PROBLEMS (unusual diarrhea, constipation, pain near the anus) TENDERNESS IN MOUTH AND THROAT WITH OR WITHOUT PRESENCE OF ULCERS (sore throat, sores in mouth, or a toothache) UNUSUAL RASH, SWELLING OR PAIN  UNUSUAL VAGINAL DISCHARGE OR ITCHING   Items with * indicate a potential emergency and should be followed up as soon as possible or go to the Emergency Department if any problems should occur.  Please show the CHEMOTHERAPY ALERT CARD or IMMUNOTHERAPY ALERT CARD at  check-in to the Emergency Department and triage nurse.  Should you have questions after your visit or need to cancel or reschedule your appointment, please contact [facility]  Dept: [phone]  and follow the prompts.  Office hours are 8:00 a.m. to 4:30 p.m. Monday - Friday. Please note that voicemails left after 4:00 p.m. may not be returned until the following business day.  We are closed weekends and major holidays. You have access to a nurse at all times for urgent questions. Please call the main number to the clinic Dept: [phone] and follow the prompts.   For any non-urgent questions, you may also contact your provider using MyChart. We now offer e-Visits for anyone 18 and older to request care online for non-urgent symptoms. For details visit [email or web address].   Also download the MyChart app! Go to the app store, search "MyChart", open the app, select [facility], and log in with your MyChart username and password.  Due to Covid, a mask is required upon entering the hospital/clinic. If you do not have a mask, one will be given to you upon arrival. For doctor visits, patients may have 1 support person aged 70 or older with them. For treatment visits, patients cannot have anyone with them due to current Covid guidelines and our immunocompromised population.   Ramucirumab injection What is this medication? RAMUCIRUMAB (ra mue SIR ue mab) is a monoclonal antibody. It is used to treatstomach cancer, colorectal cancer, liver cancer, and lung cancer. This medicine may be used for other purposes; ask your health care provider orpharmacist if you have questions. COMMON BRAND NAME(S):  Cyramza What should I tell my care team before I take this medication? They need to know if you have any of these conditions: bleeding disorders blood clots heart disease, including heart failure, heart attack, or chest pain (angina) high blood pressure infection (especially a  virus infection such as chickenpox, cold sores, or herpes) protein in your urine recent or planning to have surgery stroke an unusual or allergic reaction to ramucirumab, other medicines, foods, dyes, or preservatives pregnant or trying to get pregnant breast-feeding How should I use this medication? This medicine is for infusion into a vein. It is given by a health careprofessional in a hospital or clinic setting. Talk to your pediatrician regarding the use of this medicine in children.Special care may be needed. Overdosage: If you think you have taken too much of this medicine contact apoison control center or emergency room at once. NOTE: This medicine is only for you. Do not share this medicine with others. What if I miss a dose? It is important not to miss your dose. Call your doctor or health careprofessional if you are unable to keep an appointment. What may interact with this medication? Interactions have not been studied. This list may not describe all possible interactions. Give your health care provider a list of all the medicines, herbs, non-prescription drugs, or dietary supplements you use. Also tell them if you smoke, drink alcohol, or use illegaldrugs. Some items may interact with your medicine. What should I watch for while using this medication? Your condition will be monitored carefully while you are receiving this medicine. You will need to to check your blood pressure and have your blood andurine tested while you are taking this medicine. Your condition will be monitored carefully while you are receiving thismedicine. This medicine may increase your risk to bruise or bleed. Call your doctor orhealth care professional if you notice any unusual bleeding. Before having surgery, talk to your health care provider to make sure it is ok. This drug can increase the risk of poor healing of your surgical site or wound. You will need to stop this drug for 28 days before surgery. After  surgery, wait at least 2 weeks before restarting this drug. Make sure the surgical site or wound is healed enough before restarting this drug. Talk to your health careprovider if questions. Do not become pregnant while taking this medicine or for 3 months after stopping it. Women should inform their doctor if they wish to become pregnant or think they might be pregnant. There is a potential for serious side effects to an unborn child. Talk to your health care professional or pharmacist for more information. Do not breast-feed an infant while taking this medicine orfor 2 months after stopping it. This medicine may interfere with the ability to have a child. Talk with yourdoctor or health care professional if you are concerned about your fertility. What side effects may I notice from receiving this medication? Side effects that you should report to your doctor or health care professionalas soon as possible: allergic reactions like skin rash, itching or hives, breathing problems, swelling of the face, lips, or tongue signs of infection - fever or chills, cough, sore throat chest pain or chest tightness confusion dizziness feeling faint or lightheaded, falls severe abdominal pain severe nausea, vomiting signs and symptoms of bleeding such as bloody or black, tarry stools; red or dark-brown urine; spitting up blood or brown material that looks like coffee grounds; red spots on the skin; unusual bruising or bleeding  from the eye, gums, or nose signs and symptoms of a blood clot such as breathing problems; changes in vision; chest pain; severe, sudden headache; pain, swelling, warmth in the leg; trouble speaking; sudden numbness or weakness of the face, arm or leg symptoms of a stroke: change in mental awareness, inability to talk or move one side of the body trouble walking, dizziness, loss of balance or coordination Side effects that usually do not require medical attention (report to yourdoctor or  health care professional if they continue or are bothersome): cold, clammy skin constipation diarrhea headache nausea, vomiting stomach pain unusually slow heartbeat unusually weak or tired This list may not describe all possible side effects. Call your doctor for medical advice about side effects. You may report side effects to FDA at1-800-FDA-1088. Where should I keep my medication? This drug is given in a hospital or clinic and will not be stored at home. NOTE: This sheet is a summary. It may not cover all possible information. If you have questions about this medicine, talk to your doctor, pharmacist, orhealth care provider.  2022 Elsevier/Gold Standard (2019-04-08 11:17:50)  Docetaxel injection What is this medication? DOCETAXEL (doe se TAX el) is a chemotherapy drug. It targets fast dividing cells, like cancer cells, and causes these cells to die. This medicine is used to treat many types of cancers like breast cancer, certain stomach cancers,head and neck cancer, lung cancer, and prostate cancer. This medicine may be used for other purposes; ask your health care provider orpharmacist if you have questions. COMMON BRAND NAME(S): Docefrez, Taxotere What should I tell my care team before I take this medication? They need to know if you have any of these conditions: infection (especially a virus infection such as chickenpox, cold sores, or herpes) liver disease low blood counts, like low white cell, platelet, or red cell counts an unusual or allergic reaction to docetaxel, polysorbate 80, other chemotherapy agents, other medicines, foods, dyes, or preservatives pregnant or trying to get pregnant breast-feeding How should I use this medication? This drug is given as an infusion into a vein. It is administered in a hospitalor clinic by a specially trained health care professional. Talk to your pediatrician regarding the use of this medicine in children.Special care may be  needed. Overdosage: If you think you have taken too much of this medicine contact apoison control center or emergency room at once. NOTE: This medicine is only for you. Do not share this medicine with others. What if I miss a dose? It is important not to miss your dose. Call your doctor or health careprofessional if you are unable to keep an appointment. What may interact with this medication? Do not take this medicine with any of the following medications: live virus vaccines This medicine may also interact with the following medications: aprepitant certain antibiotics like erythromycin or clarithromycin certain antivirals for HIV or hepatitis certain medicines for fungal infections like fluconazole, itraconazole, ketoconazole, posaconazole, or voriconazole cimetidine ciprofloxacin conivaptan cyclosporine dronedarone fluvoxamine grapefruit juice imatinib verapamil This list may not describe all possible interactions. Give your health care provider a list of all the medicines, herbs, non-prescription drugs, or dietary supplements you use. Also tell them if you smoke, drink alcohol, or use illegaldrugs. Some items may interact with your medicine. What should I watch for while using this medication? Your condition will be monitored carefully while you are receiving this medicine. You will need important blood work done while you are taking thismedicine. Call your doctor or health care  professional for advice if you get a fever, chills or sore throat, or other symptoms of a cold or flu. Do not treat yourself. This drug decreases your body's ability to fight infections. Try toavoid being around people who are sick. Some products may contain alcohol. Ask your health care professional if this medicine contains alcohol. Be sure to tell all health care professionals you are taking this medicine. Certain medicines, like metronidazole and disulfiram, can cause an unpleasant reaction when taken with  alcohol. The reaction includes flushing, headache, nausea, vomiting, sweating, and increased thirst. Thereaction can last from 30 minutes to several hours. You may get drowsy or dizzy. Do not drive, use machinery, or do anything that needs mental alertness until you know how this medicine affects you. Do not stand or sit up quickly, especially if you are an older patient. This reduces the risk of dizzy or fainting spells. Alcohol may interfere with the effect ofthis medicine. Talk to your health care professional about your risk of cancer. You may bemore at risk for certain types of cancer if you take this medicine. Do not become pregnant while taking this medicine or for 6 months after stopping it. Women should inform their doctor if they wish to become pregnant or think they might be pregnant. There is a potential for serious side effects to an unborn child. Talk to your health care professional or pharmacist for more information. Do not breast-feed an infant while taking this medicine orfor 1 week after stopping it. Males who get this medicine must use a condom during sex with females who can get pregnant. If you get a woman pregnant, the baby could have birth defects. The baby could die before they are born. You will need to continue wearing a condom for 3 months after stopping the medicine. Tell your health care providerright away if your partner becomes pregnant while you are taking this medicine. This may interfere with the ability to father a child. You should talk to yourdoctor or health care professional if you are concerned about your fertility. What side effects may I notice from receiving this medication? Side effects that you should report to your doctor or health care professionalas soon as possible: allergic reactions like skin rash, itching or hives, swelling of the face, lips, or tongue blurred vision breathing problems changes in vision low blood counts - This drug may decrease the  number of white blood cells, red blood cells and platelets. You may be at increased risk for infections and bleeding. nausea and vomiting pain, redness or irritation at site where injected pain, tingling, numbness in the hands or feet redness, blistering, peeling, or loosening of the skin, including inside the mouth signs of decreased platelets or bleeding - bruising, pinpoint red spots on the skin, black, tarry stools, nosebleeds signs of decreased red blood cells - unusually weak or tired, fainting spells, lightheadedness signs of infection - fever or chills, cough, sore throat, pain or difficulty passing urine swelling of the ankle, feet, hands Side effects that usually do not require medical attention (report to yourdoctor or health care professional if they continue or are bothersome): constipation diarrhea fingernail or toenail changes hair loss loss of appetite mouth sores muscle pain This list may not describe all possible side effects. Call your doctor for medical advice about side effects. You may report side effects to FDA at1-800-FDA-1088. Where should I keep my medication? This drug is given in a hospital or clinic and will not be stored at home.  NOTE: This sheet is a summary. It may not cover all possible information. If you have questions about this medicine, talk to your doctor, pharmacist, orhealth care provider.  2022 Elsevier/Gold Standard (2019-05-11 19:50:31)

## 2021-01-06 ENCOUNTER — Encounter: Payer: Self-pay | Admitting: Oncology

## 2021-01-06 ENCOUNTER — Other Ambulatory Visit (HOSPITAL_BASED_OUTPATIENT_CLINIC_OR_DEPARTMENT_OTHER): Payer: Self-pay

## 2021-01-06 ENCOUNTER — Ambulatory Visit: Payer: Medicare Other | Attending: Internal Medicine

## 2021-01-06 ENCOUNTER — Other Ambulatory Visit: Payer: Self-pay

## 2021-01-06 ENCOUNTER — Inpatient Hospital Stay: Payer: Medicare Other

## 2021-01-06 VITALS — BP 125/80 | HR 92 | Temp 98.0°F | Resp 20

## 2021-01-06 DIAGNOSIS — Z5111 Encounter for antineoplastic chemotherapy: Secondary | ICD-10-CM | POA: Diagnosis not present

## 2021-01-06 DIAGNOSIS — Z23 Encounter for immunization: Secondary | ICD-10-CM

## 2021-01-06 DIAGNOSIS — C3490 Malignant neoplasm of unspecified part of unspecified bronchus or lung: Secondary | ICD-10-CM

## 2021-01-06 MED ORDER — PFIZER-BIONT COVID-19 VAC-TRIS 30 MCG/0.3ML IM SUSP
INTRAMUSCULAR | 0 refills | Status: DC
  Filled 2021-01-06: qty 0.3, 1d supply, fill #0

## 2021-01-06 MED ORDER — PEGFILGRASTIM-CBQV 6 MG/0.6ML ~~LOC~~ SOSY
6.0000 mg | PREFILLED_SYRINGE | Freq: Once | SUBCUTANEOUS | Status: AC
  Administered 2021-01-06: 6 mg via SUBCUTANEOUS

## 2021-01-06 NOTE — Patient Instructions (Signed)

## 2021-01-06 NOTE — Progress Notes (Signed)
   Covid-19 Vaccination Clinic  Name:  Ricky Conway    MRN: [id number] DOB: 1945/11/26  01/06/2021  [name] was observed post Covid-19 immunization for 15 minutes without incident. He was provided with Vaccine Information Sheet and instruction to access the V-Safe system.   [name] was instructed to call 911 with any severe reactions post vaccine: Difficulty breathing  Swelling of face and throat  A fast heartbeat  A bad rash all over body  Dizziness and weakness   Immunizations Administered     Name Date Dose VIS Date Route   PFIZER Comrnaty(Gray TOP) Covid-19 Vaccine 01/06/2021 11:32 AM 0.3 mL 06/02/2020 Intramuscular   Manufacturer: [employer]   Lot: [id number]   [place]: [phone]

## 2021-01-20 ENCOUNTER — Other Ambulatory Visit: Payer: Self-pay | Admitting: Oncology

## 2021-01-25 ENCOUNTER — Inpatient Hospital Stay: Payer: Medicare Other

## 2021-01-25 ENCOUNTER — Telehealth: Payer: Self-pay

## 2021-01-25 ENCOUNTER — Inpatient Hospital Stay (HOSPITAL_BASED_OUTPATIENT_CLINIC_OR_DEPARTMENT_OTHER): Payer: Medicare Other | Admitting: Oncology

## 2021-01-25 ENCOUNTER — Inpatient Hospital Stay: Payer: Medicare Other | Attending: Oncology

## 2021-01-25 ENCOUNTER — Other Ambulatory Visit: Payer: Self-pay

## 2021-01-25 VITALS — BP 133/82 | HR 91 | Temp 98.2°F | Resp 18 | Ht 71.0 in | Wt 178.0 lb

## 2021-01-25 DIAGNOSIS — C3431 Malignant neoplasm of lower lobe, right bronchus or lung: Secondary | ICD-10-CM | POA: Diagnosis present

## 2021-01-25 DIAGNOSIS — C3490 Malignant neoplasm of unspecified part of unspecified bronchus or lung: Secondary | ICD-10-CM | POA: Diagnosis not present

## 2021-01-25 DIAGNOSIS — Z5112 Encounter for antineoplastic immunotherapy: Secondary | ICD-10-CM | POA: Insufficient documentation

## 2021-01-25 DIAGNOSIS — I10 Essential (primary) hypertension: Secondary | ICD-10-CM | POA: Diagnosis not present

## 2021-01-25 DIAGNOSIS — J449 Chronic obstructive pulmonary disease, unspecified: Secondary | ICD-10-CM | POA: Diagnosis not present

## 2021-01-25 DIAGNOSIS — Z79899 Other long term (current) drug therapy: Secondary | ICD-10-CM | POA: Diagnosis not present

## 2021-01-25 DIAGNOSIS — C787 Secondary malignant neoplasm of liver and intrahepatic bile duct: Secondary | ICD-10-CM | POA: Diagnosis present

## 2021-01-25 LAB — CBC WITH DIFFERENTIAL (CANCER CENTER ONLY)
Abs Immature Granulocytes: 0.05 10*3/uL (ref 0.00–0.07)
Basophils Absolute: 0.1 10*3/uL (ref 0.0–0.1)
Basophils Relative: 1 %
Eosinophils Absolute: 0 10*3/uL (ref 0.0–0.5)
Eosinophils Relative: 0 %
HCT: 45.2 % (ref 39.0–52.0)
Hemoglobin: 15.2 g/dL (ref 13.0–17.0)
Immature Granulocytes: 0 %
Lymphocytes Relative: 7 %
Lymphs Abs: 1.1 10*3/uL (ref 0.7–4.0)
MCH: 32 pg (ref 26.0–34.0)
MCHC: 33.6 g/dL (ref 30.0–36.0)
MCV: 95.2 fL (ref 80.0–100.0)
Monocytes Absolute: 1.2 10*3/uL — ABNORMAL HIGH (ref 0.1–1.0)
Monocytes Relative: 8 %
Neutro Abs: 13.6 10*3/uL — ABNORMAL HIGH (ref 1.7–7.7)
Neutrophils Relative %: 84 %
Platelet Count: 248 10*3/uL (ref 150–400)
RBC: 4.75 MIL/uL (ref 4.22–5.81)
RDW: 17.8 % — ABNORMAL HIGH (ref 11.5–15.5)
WBC Count: 16.1 10*3/uL — ABNORMAL HIGH (ref 4.0–10.5)
nRBC: 0 % (ref 0.0–0.2)

## 2021-01-25 LAB — CMP (CANCER CENTER ONLY)
ALT: 99 U/L — ABNORMAL HIGH (ref 0–44)
AST: 98 U/L — ABNORMAL HIGH (ref 15–41)
Albumin: 3 g/dL — ABNORMAL LOW (ref 3.5–5.0)
Alkaline Phosphatase: 508 U/L — ABNORMAL HIGH (ref 38–126)
Anion gap: 8 (ref 5–15)
BUN: 9 mg/dL (ref 8–23)
CO2: 30 mmol/L (ref 22–32)
Calcium: 8.8 mg/dL — ABNORMAL LOW (ref 8.9–10.3)
Chloride: 97 mmol/L — ABNORMAL LOW (ref 98–111)
Creatinine: 0.39 mg/dL — ABNORMAL LOW (ref 0.61–1.24)
GFR, Estimated: >60 mL/min (ref >60)
Glucose, Bld: 93 mg/dL (ref 70–99)
Potassium: 4.2 mmol/L (ref 3.5–5.1)
Sodium: 135 mmol/L (ref 135–145)
Total Bilirubin: 1.2 mg/dL (ref 0.3–1.2)
Total Protein: 5.9 g/dL — ABNORMAL LOW (ref 6.5–8.1)

## 2021-01-25 MED ORDER — HEPARIN SOD (PORK) LOCK FLUSH 100 UNIT/ML IV SOLN
500.0000 [IU] | Freq: Once | INTRAVENOUS | Status: AC | PRN
  Administered 2021-01-25: 500 [IU]
  Filled 2021-01-25: qty 5

## 2021-01-25 MED ORDER — SODIUM CHLORIDE 0.9 % IV SOLN
Freq: Once | INTRAVENOUS | Status: AC
  Filled 2021-01-25: qty 250

## 2021-01-25 MED ORDER — SODIUM CHLORIDE 0.9% FLUSH
10.0000 mL | INTRAVENOUS | Status: DC | PRN
  Administered 2021-01-25: 10 mL
  Filled 2021-01-25: qty 10

## 2021-01-25 MED ORDER — DIPHENHYDRAMINE HCL 50 MG/ML IJ SOLN
25.0000 mg | Freq: Once | INTRAMUSCULAR | Status: AC
  Administered 2021-01-25: 25 mg via INTRAVENOUS

## 2021-01-25 MED ORDER — SODIUM CHLORIDE 0.9 % IV SOLN
10.0000 mg/kg | Freq: Once | INTRAVENOUS | Status: AC
  Administered 2021-01-25: 800 mg via INTRAVENOUS
  Filled 2021-01-25: qty 50

## 2021-01-25 MED ORDER — ACETAMINOPHEN 325 MG PO TABS
650.0000 mg | ORAL_TABLET | Freq: Once | ORAL | Status: AC
  Administered 2021-01-25: 650 mg via ORAL
  Filled 2021-01-25: qty 2

## 2021-01-25 NOTE — Patient Instructions (Signed)
[facility]  Discharge Instructions: Thank you for choosing [facility] to provide your oncology and hematology care.   If you have a lab appointment with the [facility], please go directly to the [facility] and check in at the registration area.   Wear comfortable clothing and clothing appropriate for easy access to any Portacath or PICC line.   We strive to give you quality time with your provider. You may need to reschedule your appointment if you arrive late (15 or more minutes).  Arriving late affects you and other patients whose appointments are after yours.  Also, if you miss three or more appointments without notifying the office, you may be dismissed from the clinic at the provider's discretion.      For prescription refill requests, have your pharmacy contact our office and allow 72 hours for refills to be completed.    Today you received the following chemotherapy and/or immunotherapy agents ramucirumab    To help prevent nausea and vomiting after your treatment, we encourage you to take your nausea medication as directed.  BELOW ARE SYMPTOMS THAT SHOULD BE REPORTED IMMEDIATELY: *FEVER GREATER THAN 100.4 F (38 C) OR HIGHER *CHILLS OR SWEATING *NAUSEA AND VOMITING THAT IS NOT CONTROLLED WITH YOUR NAUSEA MEDICATION *UNUSUAL SHORTNESS OF BREATH *UNUSUAL BRUISING OR BLEEDING *URINARY PROBLEMS (pain or burning when urinating, or frequent urination) *BOWEL PROBLEMS (unusual diarrhea, constipation, pain near the anus) TENDERNESS IN MOUTH AND THROAT WITH OR WITHOUT PRESENCE OF ULCERS (sore throat, sores in mouth, or a toothache) UNUSUAL RASH, SWELLING OR PAIN  UNUSUAL VAGINAL DISCHARGE OR ITCHING   Items with * indicate a potential emergency and should be followed up as soon as possible or go to the Emergency Department if any problems should occur.  Please show the CHEMOTHERAPY ALERT CARD or IMMUNOTHERAPY ALERT CARD at check-in to the  Emergency Department and triage nurse.  Should you have questions after your visit or need to cancel or reschedule your appointment, please contact [facility]  Dept: [phone]  and follow the prompts.  Office hours are 8:00 a.m. to 4:30 p.m. Monday - Friday. Please note that voicemails left after 4:00 p.m. may not be returned until the following business day.  We are closed weekends and major holidays. You have access to a nurse at all times for urgent questions. Please call the main number to the clinic Dept: [phone] and follow the prompts.   For any non-urgent questions, you may also contact your provider using MyChart. We now offer e-Visits for anyone 51 and older to request care online for non-urgent symptoms. For details visit [email or web address].   Also download the MyChart app! Go to the app store, search "MyChart", open the app, select [facility], and log in with your MyChart username and password.  Due to Covid, a mask is required upon entering the hospital/clinic. If you do not have a mask, one will be given to you upon arrival. For doctor visits, patients may have 1 support person aged 88 or older with them. For treatment visits, patients cannot have anyone with them due to current Covid guidelines and our immunocompromised population.   Ramucirumab injection What is this medication? RAMUCIRUMAB (ra mue SIR ue mab) is a monoclonal antibody. It is used to treatstomach cancer, colorectal cancer, liver cancer, and lung cancer. This medicine may be used for other purposes; ask your health care provider orpharmacist if you have questions. COMMON BRAND NAME(S): Cyramza  What should I tell my care team before I take this medication? They need to know if you have any of these conditions: bleeding disorders blood clots heart disease, including heart failure, heart attack, or chest pain (angina) high blood pressure infection (especially a virus infection  such as chickenpox, cold sores, or herpes) protein in your urine recent or planning to have surgery stroke an unusual or allergic reaction to ramucirumab, other medicines, foods, dyes, or preservatives pregnant or trying to get pregnant breast-feeding How should I use this medication? This medicine is for infusion into a vein. It is given by a health careprofessional in a hospital or clinic setting. Talk to your pediatrician regarding the use of this medicine in children.Special care may be needed. Overdosage: If you think you have taken too much of this medicine contact apoison control center or emergency room at once. NOTE: This medicine is only for you. Do not share this medicine with others. What if I miss a dose? It is important not to miss your dose. Call your doctor or health careprofessional if you are unable to keep an appointment. What may interact with this medication? Interactions have not been studied. This list may not describe all possible interactions. Give your health care provider a list of all the medicines, herbs, non-prescription drugs, or dietary supplements you use. Also tell them if you smoke, drink alcohol, or use illegaldrugs. Some items may interact with your medicine. What should I watch for while using this medication? Your condition will be monitored carefully while you are receiving this medicine. You will need to to check your blood pressure and have your blood andurine tested while you are taking this medicine. Your condition will be monitored carefully while you are receiving thismedicine. This medicine may increase your risk to bruise or bleed. Call your doctor orhealth care professional if you notice any unusual bleeding. Before having surgery, talk to your health care provider to make sure it is ok. This drug can increase the risk of poor healing of your surgical site or wound. You will need to stop this drug for 28 days before surgery. After surgery, wait at  least 2 weeks before restarting this drug. Make sure the surgical site or wound is healed enough before restarting this drug. Talk to your health careprovider if questions. Do not become pregnant while taking this medicine or for 3 months after stopping it. Women should inform their doctor if they wish to become pregnant or think they might be pregnant. There is a potential for serious side effects to an unborn child. Talk to your health care professional or pharmacist for more information. Do not breast-feed an infant while taking this medicine orfor 2 months after stopping it. This medicine may interfere with the ability to have a child. Talk with yourdoctor or health care professional if you are concerned about your fertility. What side effects may I notice from receiving this medication? Side effects that you should report to your doctor or health care professionalas soon as possible: allergic reactions like skin rash, itching or hives, breathing problems, swelling of the face, lips, or tongue signs of infection - fever or chills, cough, sore throat chest pain or chest tightness confusion dizziness feeling faint or lightheaded, falls severe abdominal pain severe nausea, vomiting signs and symptoms of bleeding such as bloody or black, tarry stools; red or dark-brown urine; spitting up blood or brown material that looks like coffee grounds; red spots on the skin; unusual bruising or bleeding from  the eye, gums, or nose signs and symptoms of a blood clot such as breathing problems; changes in vision; chest pain; severe, sudden headache; pain, swelling, warmth in the leg; trouble speaking; sudden numbness or weakness of the face, arm or leg symptoms of a stroke: change in mental awareness, inability to talk or move one side of the body trouble walking, dizziness, loss of balance or coordination Side effects that usually do not require medical attention (report to yourdoctor or health care  professional if they continue or are bothersome): cold, clammy skin constipation diarrhea headache nausea, vomiting stomach pain unusually slow heartbeat unusually weak or tired This list may not describe all possible side effects. Call your doctor for medical advice about side effects. You may report side effects to FDA at1-800-FDA-1088. Where should I keep my medication? This drug is given in a hospital or clinic and will not be stored at home. NOTE: This sheet is a summary. It may not cover all possible information. If you have questions about this medicine, talk to your doctor, pharmacist, orhealth care provider.  2022 Elsevier/Gold Standard (2019-04-08 11:17:50)

## 2021-01-25 NOTE — Progress Notes (Signed)
[facility] OFFICE PROGRESS NOTE   Diagnosis: Metastatic carcinoma  INTERVAL HISTORY:   Ricky Conway completed another cycle with docetaxel/ramucirumab on 01/04/2021.  He reports malaise following chemotherapy.  No neuropathy symptoms.  No pain.  He continues to have dyspnea.  He wears oxygen intermittently.  Objective:  Vital signs in last 24 hours:  Blood pressure 133/82, pulse 91, temperature 98.2 F (36.8 C), temperature source Oral, resp. rate 18, height 5' 11" (1.803 m), weight 178 lb (80.7 kg), SpO2 92 %.    HEENT: No thrush or ulcers Resp: Lungs with distant breath sounds, no respiratory distress Cardio: Regular rate and rhythm GI: Nontender, no hepatosplenomegaly Vascular: Chronic stasis change at the lower leg bilaterally, no edema  Skin: Palms without erythema  Portacath/PICC-without erythema  Lab Results:  Lab Results  Component Value Date   WBC 16.1 (H) 01/25/2021   HGB 15.2 01/25/2021   HCT 45.2 01/25/2021   MCV 95.2 01/25/2021   PLT 248 01/25/2021   NEUTROABS 13.6 (H) 01/25/2021    CMP  Lab Results  Component Value Date   NA 134 (L) 01/04/2021   K 3.9 01/04/2021   CL 97 (L) 01/04/2021   CO2 30 01/04/2021   GLUCOSE 97 01/04/2021   BUN 8 01/04/2021   CREATININE 0.43 (L) 01/04/2021   CALCIUM 9.0 01/04/2021   PROT 5.8 (L) 01/04/2021   ALBUMIN 3.4 (L) 01/04/2021   AST 31 01/04/2021   ALT 23 01/04/2021   ALKPHOS 109 01/04/2021   BILITOT 0.8 01/04/2021   GFRNONAA >60 01/04/2021   GFRAA >60 03/15/2020    Lab Results  Component Value Date   CEA1 3,072.50 (H) 11/22/2020   CEA 2,993.71 (H) 01/04/2021   IRC789 118 (H) 09/08/2018     Medications: I have reviewed the patient's current medications.   Assessment/Plan: Pancreas mass, liver lesions Abdominal ultrasound 09/03/2018-3 liver lesions.  Intra-and extrahepatic biliary ductal dilatation. Presentation to the emergency department 09/08/2018 with painless jaundice.  Initial  bilirubin returned at 18.3.   MRI abdomen 09/08/2018-1.9 x 2.6 cm hypoenhancing lesion arising exophytically along the posterior aspect of the pancreatic head suspicious for pancreatic adenocarcinoma.  There was suspected involvement of the distal common duct with secondary intrahepatic/extrahepatic ductal dilatation.  Lesion noted to abut the IVC.  Multiple liver metastases noted.  Small upper abdominal/retroperitoneal lymph nodes noted.   ERCP 09/09/2018-high-grade stricture and obstruction of the distal common bile duct.  A metallic biliary stent was placed.  Brushings in the mid and lower third of the main bile duct were obtained with no malignant cells identified.   Bilirubin improved at 4.7 on 09/10/2018.   Biopsy of a liver lesion on 09/11/2018-metastatic carcinoma, cytokeratin 7 and TTF-1 positive, negative for cytokeratin 20, CDX 2, and PSA.  Foundation 1-microsatellite stable, tumor mutation burden 16, ERBB2 amplification CT chest 09/22/2018- right hilar/mediastinal/retrocrural lymphadenopathy, ill-defined right lower lobe nodularity, emphysema Cycle 1 carboplatin/Alimta/pembrolizumab 10/09/2018 Cycle 2 carboplatin/Alimta/pembrolizumab 10/30/2018 Cycle 3 carboplatin/Alimta/pembrolizumab 11/20/2018 Cycle 4 carboplatin/Alimta/pembrolizumab 12/11/2018 CT 12/26/2018- marked response to therapy of the radical abdominal adenopathy, decreased size of hepatic metastases, no new or progressive disease Cycle 5 Alimta/pembrolizumab 01/01/2019 Cycle 6 Alimta/pembrolizumab 01/23/2019 Cycle 7 Alimta/pembrolizumab 02/12/2019 Cycle 8  Alimta/pembrolizumab 03/05/2019 Cycle 9  Alimta/pembrolizumab 03/26/2019 CTs 04/14/2019-slight interval decrease in size and conspicuity of multiple, irregular low-attenuation liver lesions.  No new liver lesions.  No recurrent mediastinal or hilar lymphadenopathy.  No change in mildly prominent subcentimeter periaortic and peripheral lymph nodes without evidence of recurrent abdominal or  pelvic lymphadenopathy. Cycle 10 Alimta/pembrolizumab 04/16/2019 Cycle 11 pembrolizumab 05/07/2019 (Alimta held secondary to increased tearing and leg edema) Cycle 12 Alimta/pembrolizumab 05/28/2019 Cycle 13 Alimta/pembrolizumab 06/18/2019 Restaging CTs 07/08/2019-worsening of low-density hepatic lesions.  Scattered small lymph nodes with increasing conspicuity, largest along the left periaortic chain approximately 9 mm previously approximately 5 mm. Cycle 14 Alimta/pembrolizumab 07/09/2019 Cycle 15 Alimta/pembrolizumab 07/31/2019 Cycle 16 Alimta/pembrolizumab 08/20/2019 Rising CEA beginning December 2020 CTs 09/08/2019-enlargement of liver metastases, mild enlargement of abdominal/retroperitoneal and lower thoracic nodes, nonspecific pulmonary nodules-stable Cycle 1 gemcitabine 09/17/2019 Cycle 2 gemcitabine 10/01/2019 Cycle 3 gemcitabine 10/15/2019 Cycle 4 gemcitabine 10/29/2019 Cycle 5 gemcitabine 11/12/2019 Rising CEA 10/2019 CTs 11/19/19 mixed response in liver, stable pulmonary nodules, thoracic and RP nodes, overall stable Plan to intensify chemo carboplatin AUC 4 plus gemcitabine given on day 1 q21 days starting ~12/03/19   Admission 11/30/2019 with recurrent biliary obstruction-ERCP 12/02/2019-obstructed biliary stent, new stent placed Cycle 1 gemcitabine/carboplatin 12/24/2019 Cycle 2 gemcitabine/carboplatin 01/14/2020 Cycle 3 gemcitabine/carboplatin 02/04/2020 Cycle 4 gemcitabine/carboplatin 02/25/2020 CTs 03/15/2020-decrease in size of hepatic metastatic lesions. No new liver lesions. New sclerotic lesions in the thoracic and lumbar spine. Stable small pulmonary nodules. Asymmetric wall thickening of the medial wall of the gallbladder new since previous exam without signs of inflammation. Cycle 5 gemcitabine/carboplatin 03/17/2020 Cycle 6 gemcitabine/carboplatin 04/15/2020 Cycle 7 gemcitabine/carboplatin 05/09/2020 Cycle 8 gemcitabine/carboplatin 05/31/2020 CTs 06/16/2020-unchanged sclerotic bone  lesions, increase in size and number of liver metastases Cycle 1 docetaxel/ramucirumab 06/27/2020 Cycle 2 docetaxel/ramucirumab 07/18/2020 Cycle 3 docetaxel/ramucirumab 08/08/2020 Cycle 4 docetaxel/ramucirumab 08/29/2020 CT 09/16/2020-decreased size of liver lesions, stable bone metastases Cycle 5 docetaxel/ramucirumab 09/19/2020 Cycle 6 docetaxel/ramucirumab 10/10/2020 Cycle 7 docetaxel/ramucirumab 11/01/2020 Cycle 8 docetaxel/ramucirumab 11/22/2020 CT abdomen/pelvis 12/09/2020-hepatic and osseous metastatic disease unchanged. Cycle 9 docetaxel/ramucirumab 12/13/2020 Cycle 10 docetaxel/ramucirumab 01/04/2021 Cycle 11 docetaxel/ramucirumab 01/25/2021 COPD Hypertension Trace edema right leg 12/11/2018. Doppler negative for DVT 12/12/2018. Watery eyes, runny nose reported 12/11/2018.  Question related to Alimta.  Persistent watery eyes 04/16/2019.  Ophthalmology evaluation 05/11/2019-blepharitis,ectropion left lower lid, macular degeneration Admission 12/24/2018 with a fever- no source for infection identified, completed outpatient course of ciprofloxacin Port-A-Cath placement 09/14/2019, interventional radiology Colonoscopy 06/16/2015-diverticulosis in the sigmoid colon, examination otherwise normal     Disposition: Ricky Conway appears stable.  He continues to tolerate the chemotherapy well.  He will complete another cycle today.  The CEA was slightly higher when he was here 3 weeks ago.  We will follow-up on the CEA from today.  He will return for an office visit and chemotherapy in 3 weeks.  He continues to receive G-CSF support.  He will be scheduled for restaging CTs in September.  Gary Sherrill, MD  01/25/2021  11:42 AM    

## 2021-01-25 NOTE — Telephone Encounter (Signed)
Per Dr .Benay Spice Pt was notified of elevated liver enzymes and if he should notice his eyes or skin get jaundice (yellow) and having any itching to give [place] a call. Informed Pt that he does not need injection on Friday and appointment has been canceled. Pt verbalized understanding.

## 2021-01-25 NOTE — Progress Notes (Signed)
Per Dr Benay Spice OK to treat with AST 98 ALT 99

## 2021-01-26 ENCOUNTER — Telehealth: Payer: Self-pay

## 2021-01-26 LAB — CEA (ACCESS): CEA (CHCC): 4080.75 ng/mL — ABNORMAL HIGH (ref 0.00–5.00)

## 2021-01-26 NOTE — Telephone Encounter (Signed)
TC from Pt's wife inquiring about Pt's visit yesterday. Informed Pt's wife that Pt's CEA is lower and his liver functions are elevated. Per Dr Benay Spice could be a side effect from the Chemo Pt's appointment was canceled for GCSF for Friday per Dr Benay Spice and if his CEA should go back up he will send him for a repeat scan. Pt's wife was informed to look to see if Pt becomes jaundiced and give the clinic a call if this happens. Pt's wife verbalized understanding No further problems or concerns noted.

## 2021-01-27 ENCOUNTER — Inpatient Hospital Stay: Payer: Medicare Other

## 2021-02-12 ENCOUNTER — Other Ambulatory Visit: Payer: Self-pay | Admitting: Oncology

## 2021-02-15 ENCOUNTER — Inpatient Hospital Stay: Payer: Medicare Other

## 2021-02-15 ENCOUNTER — Encounter: Payer: Self-pay | Admitting: Nurse Practitioner

## 2021-02-15 ENCOUNTER — Inpatient Hospital Stay (HOSPITAL_BASED_OUTPATIENT_CLINIC_OR_DEPARTMENT_OTHER): Payer: Medicare Other | Admitting: Nurse Practitioner

## 2021-02-15 ENCOUNTER — Other Ambulatory Visit: Payer: Self-pay

## 2021-02-15 VITALS — BP 144/77 | HR 81 | Temp 98.1°F | Resp 18 | Ht 71.0 in | Wt 176.2 lb

## 2021-02-15 DIAGNOSIS — C3490 Malignant neoplasm of unspecified part of unspecified bronchus or lung: Secondary | ICD-10-CM

## 2021-02-15 DIAGNOSIS — Z5112 Encounter for antineoplastic immunotherapy: Secondary | ICD-10-CM | POA: Diagnosis not present

## 2021-02-15 LAB — TOTAL PROTEIN, URINE DIPSTICK: Protein, ur: 30 mg/dL — AB

## 2021-02-15 LAB — CBC WITH DIFFERENTIAL (CANCER CENTER ONLY)
Abs Immature Granulocytes: 0.11 10*3/uL — ABNORMAL HIGH (ref 0.00–0.07)
Basophils Absolute: 0.1 10*3/uL (ref 0.0–0.1)
Basophils Relative: 1 %
Eosinophils Absolute: 0.3 10*3/uL (ref 0.0–0.5)
Eosinophils Relative: 3 %
HCT: 45.1 % (ref 39.0–52.0)
Hemoglobin: 15.2 g/dL (ref 13.0–17.0)
Immature Granulocytes: 1 %
Lymphocytes Relative: 9 %
Lymphs Abs: 0.8 10*3/uL (ref 0.7–4.0)
MCH: 33 pg (ref 26.0–34.0)
MCHC: 33.7 g/dL (ref 30.0–36.0)
MCV: 97.8 fL (ref 80.0–100.0)
Monocytes Absolute: 0.9 10*3/uL (ref 0.1–1.0)
Monocytes Relative: 9 %
Neutro Abs: 7.6 10*3/uL (ref 1.7–7.7)
Neutrophils Relative %: 77 %
Platelet Count: 222 10*3/uL (ref 150–400)
RBC: 4.61 MIL/uL (ref 4.22–5.81)
RDW: 19.4 % — ABNORMAL HIGH (ref 11.5–15.5)
WBC Count: 9.7 10*3/uL (ref 4.0–10.5)
nRBC: 0 % (ref 0.0–0.2)

## 2021-02-15 LAB — CMP (CANCER CENTER ONLY)
ALT: 107 U/L — ABNORMAL HIGH (ref 0–44)
AST: 117 U/L — ABNORMAL HIGH (ref 15–41)
Albumin: 2.9 g/dL — ABNORMAL LOW (ref 3.5–5.0)
Alkaline Phosphatase: 683 U/L — ABNORMAL HIGH (ref 38–126)
Anion gap: 8 (ref 5–15)
BUN: 12 mg/dL (ref 8–23)
CO2: 30 mmol/L (ref 22–32)
Calcium: 8.8 mg/dL — ABNORMAL LOW (ref 8.9–10.3)
Chloride: 95 mmol/L — ABNORMAL LOW (ref 98–111)
Creatinine: 0.38 mg/dL — ABNORMAL LOW (ref 0.61–1.24)
GFR, Estimated: >60 mL/min (ref >60)
Glucose, Bld: 103 mg/dL — ABNORMAL HIGH (ref 70–99)
Potassium: 3.8 mmol/L (ref 3.5–5.1)
Sodium: 133 mmol/L — ABNORMAL LOW (ref 135–145)
Total Bilirubin: 2 mg/dL — ABNORMAL HIGH (ref 0.3–1.2)
Total Protein: 6.2 g/dL — ABNORMAL LOW (ref 6.5–8.1)

## 2021-02-15 LAB — CEA (ACCESS): CEA (CHCC): 11769.94 ng/mL — ABNORMAL HIGH (ref 0.00–5.00)

## 2021-02-15 MED ORDER — SODIUM CHLORIDE 0.9% FLUSH
10.0000 mL | Freq: Once | INTRAVENOUS | Status: AC
  Administered 2021-02-15: 10 mL via INTRAVENOUS

## 2021-02-15 MED ORDER — HEPARIN SOD (PORK) LOCK FLUSH 100 UNIT/ML IV SOLN
500.0000 [IU] | Freq: Once | INTRAVENOUS | Status: AC
  Administered 2021-02-15: 500 [IU] via INTRAVENOUS

## 2021-02-15 NOTE — Patient Instructions (Signed)
Implanted Port Home Guide An implanted port is a device that is placed under the skin. It is usually placed in the chest. The device can be used to give IV medicine, to take blood, or for dialysis. You may have an implanted port if: You need IV medicine that would be irritating to the small veins in your hands or arms. You need IV medicines, such as antibiotics, for a long period of time. You need IV nutrition for a long period of time. You need dialysis. When you have a port, your health care provider can choose to use the port instead of veins in your arms for these procedures. You may have fewer limitations when using a port than you would if you used other types of long-term IVs, and you will likely be able to return to normal activities afteryour incision heals. An implanted port has two main parts: Reservoir. The reservoir is the part where a needle is inserted to give medicines or draw blood. The reservoir is round. After it is placed, it appears as a small, raised area under your skin. Catheter. The catheter is a thin, flexible tube that connects the reservoir to a vein. Medicine that is inserted into the reservoir goes into the catheter and then into the vein. How is my port accessed? To access your port: A numbing cream may be placed on the skin over the port site. Your health care provider will put on a mask and sterile gloves. The skin over your port will be cleaned carefully with a germ-killing soap and allowed to dry. Your health care provider will gently pinch the port and insert a needle into it. Your health care provider will check for a blood return to make sure the port is in the vein and is not clogged. If your port needs to remain accessed to get medicine continuously (constant infusion), your health care provider will place a clear bandage (dressing) over the needle site. The dressing and needle will need to be changed every week, or as told by your health care provider. What  is flushing? Flushing helps keep the port from getting clogged. Follow instructions from your health care provider about how and when to flush the port. Ports are usually flushed with saline solution or a medicine called heparin. The need for flushing will depend on how the port is used: If the port is only used from time to time to give medicines or draw blood, the port may need to be flushed: Before and after medicines have been given. Before and after blood has been drawn. As part of routine maintenance. Flushing may be recommended every 4-6 weeks. If a constant infusion is running, the port may not need to be flushed. Throw away any syringes in a disposal container that is meant for sharp items (sharps container). You can buy a sharps container from a pharmacy, or you can make one by using an empty hard plastic bottle with a cover. How long will my port stay implanted? The port can stay in for as long as your health care provider thinks it is needed. When it is time for the port to come out, a surgery will be done to remove it. The surgery will be similar to the procedure that was done to putthe port in. Follow these instructions at home:  Flush your port as told by your health care provider. If you need an infusion over several days, follow instructions from your health care provider about how to take   care of your port site. Make sure you: Wash your hands with soap and water before you change your dressing. If soap and water are not available, use alcohol-based hand sanitizer. Change your dressing as told by your health care provider. Place any used dressings or infusion bags into a plastic bag. Throw that bag in the trash. Keep the dressing that covers the needle clean and dry. Do not get it wet. Do not use scissors or sharp objects near the tube. Keep the tube clamped, unless it is being used. Check your port site every day for signs of infection. Check for: Redness, swelling, or  pain. Fluid or blood. Pus or a bad smell. Protect the skin around the port site. Avoid wearing bra straps that rub or irritate the site. Protect the skin around your port from seat belts. Place a soft pad over your chest if needed. Bathe or shower as told by your health care provider. The site may get wet as long as you are not actively receiving an infusion. Return to your normal activities as told by your health care provider. Ask your health care provider what activities are safe for you. Carry a medical alert card or wear a medical alert bracelet at all times. This will let health care providers know that you have an implanted port in case of an emergency. Get help right away if: You have redness, swelling, or pain at the port site. You have fluid or blood coming from your port site. You have pus or a bad smell coming from the port site. You have a fever. Summary Implanted ports are usually placed in the chest for long-term IV access. Follow instructions from your health care provider about flushing the port and changing bandages (dressings). Take care of the area around your port by avoiding clothing that puts pressure on the area, and by watching for signs of infection. Protect the skin around your port from seat belts. Place a soft pad over your chest if needed. Get help right away if you have a fever or you have redness, swelling, pain, drainage, or a bad smell at the port site. This information is not intended to replace advice given to you by your health care provider. Make sure you discuss any questions you have with your healthcare provider. Document Revised: 10/26/2019 Document Reviewed: 10/26/2019 Elsevier Patient Education  2022 [employer]  

## 2021-02-15 NOTE — Progress Notes (Signed)
[place] OFFICE PROGRESS NOTE   Diagnosis: Metastatic carcinoma  INTERVAL HISTORY:   Mr. Ruggieri returns as scheduled.  He completed a cycle of ramucirumab 01/25/2021.  Docetaxel was held due to elevated LFTs.  He notices more fatigue.  No nausea or vomiting.  No mouth sores.  No diarrhea.  He denies bleeding.  No pain.  He reports stable leg swelling.  Dyspnea is at baseline.  He is in a wheelchair today stating he feels more secure.  Objective:  Vital signs in last 24 hours:  Blood pressure (!) 144/77, pulse 81, temperature 98.1 F (36.7 C), temperature source Oral, resp. rate 18, height '5\' 11"'  (1.803 m), weight 176 lb 3.2 oz (79.9 kg), SpO2 91 %.    HEENT: Sclera anicteric.  No thrush or ulcers. Resp: Distant breath sounds. Cardio: Regular rate and rhythm. GI: Abdomen soft and nontender.  No hepatosplenomegaly. Vascular: Trace bilateral ankle/pedal edema. Neuro: Alert and oriented. Port-A-Cath without erythema.  Lab Results:  Lab Results  Component Value Date   WBC 9.7 02/15/2021   HGB 15.2 02/15/2021   HCT 45.1 02/15/2021   MCV 97.8 02/15/2021   PLT 222 02/15/2021   NEUTROABS 7.6 02/15/2021    Imaging:  No results found.  Medications: I have reviewed the patient's current medications.  Assessment/Plan: Pancreas mass, liver lesions Abdominal ultrasound 09/03/2018-3 liver lesions.  Intra-and extrahepatic biliary ductal dilatation. Presentation to the emergency department 09/08/2018 with painless jaundice.  Initial bilirubin returned at 18.3.   MRI abdomen 09/08/2018-1.9 x 2.6 cm hypoenhancing lesion arising exophytically along the posterior aspect of the pancreatic head suspicious for pancreatic adenocarcinoma.  There was suspected involvement of the distal common duct with secondary intrahepatic/extrahepatic ductal dilatation.  Lesion noted to abut the IVC.  Multiple liver metastases noted.  Small upper abdominal/retroperitoneal lymph nodes noted.    ERCP 09/09/2018-high-grade stricture and obstruction of the distal common bile duct.  A metallic biliary stent was placed.  Brushings in the mid and lower third of the main bile duct were obtained with no malignant cells identified.   Bilirubin improved at 4.7 on 09/10/2018.   Biopsy of a liver lesion on 09/11/2018-metastatic carcinoma, cytokeratin 7 and TTF-1 positive, negative for cytokeratin 20, CDX 2, and PSA.  Foundation 1-microsatellite stable, tumor mutation burden 16, ERBB2 amplification CT chest 09/22/2018- right hilar/mediastinal/retrocrural lymphadenopathy, ill-defined right lower lobe nodularity, emphysema Cycle 1 carboplatin/Alimta/pembrolizumab 10/09/2018 Cycle 2 carboplatin/Alimta/pembrolizumab 10/30/2018 Cycle 3 carboplatin/Alimta/pembrolizumab 11/20/2018 Cycle 4 carboplatin/Alimta/pembrolizumab 12/11/2018 CT 12/26/2018- marked response to therapy of the radical abdominal adenopathy, decreased size of hepatic metastases, no new or progressive disease Cycle 5 Alimta/pembrolizumab 01/01/2019 Cycle 6 Alimta/pembrolizumab 01/23/2019 Cycle 7 Alimta/pembrolizumab 02/12/2019 Cycle 8  Alimta/pembrolizumab 03/05/2019 Cycle 9  Alimta/pembrolizumab 03/26/2019 CTs 04/14/2019-slight interval decrease in size and conspicuity of multiple, irregular low-attenuation liver lesions.  No new liver lesions.  No recurrent mediastinal or hilar lymphadenopathy.  No change in mildly prominent subcentimeter periaortic and peripheral lymph nodes without evidence of recurrent abdominal or pelvic lymphadenopathy. Cycle 10 Alimta/pembrolizumab 04/16/2019 Cycle 11 pembrolizumab 05/07/2019 (Alimta held secondary to increased tearing and leg edema) Cycle 12 Alimta/pembrolizumab 05/28/2019 Cycle 13 Alimta/pembrolizumab 06/18/2019 Restaging CTs 07/08/2019-worsening of low-density hepatic lesions.  Scattered small lymph nodes with increasing conspicuity, largest along the left periaortic chain approximately 9 mm previously  approximately 5 mm. Cycle 14 Alimta/pembrolizumab 07/09/2019 Cycle 15 Alimta/pembrolizumab 07/31/2019 Cycle 16 Alimta/pembrolizumab 08/20/2019 Rising CEA beginning December 2020 CTs 09/08/2019-enlargement of liver metastases, mild enlargement of abdominal/retroperitoneal and lower thoracic nodes, nonspecific pulmonary nodules-stable  Cycle 1 gemcitabine 09/17/2019 Cycle 2 gemcitabine 10/01/2019 Cycle 3 gemcitabine 10/15/2019 Cycle 4 gemcitabine 10/29/2019 Cycle 5 gemcitabine 11/12/2019 Rising CEA 10/2019 CTs 11/19/19 mixed response in liver, stable pulmonary nodules, thoracic and RP nodes, overall stable Plan to intensify chemo carboplatin AUC 4 plus gemcitabine given on day 1 q21 days starting ~12/03/19   Admission 11/30/2019 with recurrent biliary obstruction-ERCP 12/02/2019-obstructed biliary stent, new stent placed Cycle 1 gemcitabine/carboplatin 12/24/2019 Cycle 2 gemcitabine/carboplatin 01/14/2020 Cycle 3 gemcitabine/carboplatin 02/04/2020 Cycle 4 gemcitabine/carboplatin 02/25/2020 CTs 03/15/2020-decrease in size of hepatic metastatic lesions. No new liver lesions. New sclerotic lesions in the thoracic and lumbar spine. Stable small pulmonary nodules. Asymmetric wall thickening of the medial wall of the gallbladder new since previous exam without signs of inflammation. Cycle 5 gemcitabine/carboplatin 03/17/2020 Cycle 6 gemcitabine/carboplatin 04/15/2020 Cycle 7 gemcitabine/carboplatin 05/09/2020 Cycle 8 gemcitabine/carboplatin 05/31/2020 CTs 06/16/2020-unchanged sclerotic bone lesions, increase in size and number of liver metastases Cycle 1 docetaxel/ramucirumab 06/27/2020 Cycle 2 docetaxel/ramucirumab 07/18/2020 Cycle 3 docetaxel/ramucirumab 08/08/2020 Cycle 4 docetaxel/ramucirumab 08/29/2020 CT 09/16/2020-decreased size of liver lesions, stable bone metastases Cycle 5 docetaxel/ramucirumab 09/19/2020 Cycle 6 docetaxel/ramucirumab 10/10/2020 Cycle 7 docetaxel/ramucirumab 11/01/2020 Cycle 8 docetaxel/ramucirumab  11/22/2020 CT abdomen/pelvis 12/09/2020-hepatic and osseous metastatic disease unchanged. Cycle 9 docetaxel/ramucirumab 12/13/2020 Cycle 10 docetaxel/ramucirumab 01/04/2021 Cycle 11 ramucirumab 01/25/2021-docetaxel held due to elevated LFTs COPD Hypertension Trace edema right leg 12/11/2018. Doppler negative for DVT 12/12/2018. Watery eyes, runny nose reported 12/11/2018.  Question related to Alimta.  Persistent watery eyes 04/16/2019.  Ophthalmology evaluation 05/11/2019-blepharitis,ectropion left lower lid, macular degeneration Admission 12/24/2018 with a fever- no source for infection identified, completed outpatient course of ciprofloxacin Port-A-Cath placement 09/14/2019, interventional radiology Colonoscopy 06/16/2015-diverticulosis in the sigmoid colon, examination otherwise normal      Disposition: Mr. Hamme is on active treatment with docetaxel/ramucirumab.  Docetaxel was held 01/25/2021 due to elevated LFTs.  Review of the chemistry panel from today shows a further rise in LFTs including bilirubin.  Dr. Benay Spice recommends holding today's treatment and referring for CT scans.  We will try to get the scans done this week and see him back early next week.   Ned Card ANP/GNP-BC   02/15/2021  11:19 AM

## 2021-02-16 ENCOUNTER — Inpatient Hospital Stay: Payer: Medicare Other

## 2021-02-17 ENCOUNTER — Ambulatory Visit (HOSPITAL_BASED_OUTPATIENT_CLINIC_OR_DEPARTMENT_OTHER)
Admission: RE | Admit: 2021-02-17 | Discharge: 2021-02-17 | Disposition: A | Discharge status: Home / Self Care | Payer: Medicare Other | Source: Ambulatory Visit | Attending: Nurse Practitioner | Admitting: Nurse Practitioner

## 2021-02-17 ENCOUNTER — Other Ambulatory Visit: Payer: Self-pay

## 2021-02-17 DIAGNOSIS — C3490 Malignant neoplasm of unspecified part of unspecified bronchus or lung: Secondary | ICD-10-CM | POA: Diagnosis present

## 2021-02-17 MED ORDER — IOHEXOL 350 MG/ML SOLN
75.0000 mL | Freq: Once | INTRAVENOUS | Status: AC | PRN
  Administered 2021-02-17: 75 mL via INTRAVENOUS

## 2021-02-23 ENCOUNTER — Inpatient Hospital Stay: Payer: Medicare Other

## 2021-02-23 ENCOUNTER — Inpatient Hospital Stay: Payer: Medicare Other | Attending: Oncology

## 2021-02-23 ENCOUNTER — Other Ambulatory Visit: Payer: Self-pay

## 2021-02-23 ENCOUNTER — Encounter: Payer: Self-pay | Admitting: Nurse Practitioner

## 2021-02-23 ENCOUNTER — Inpatient Hospital Stay (HOSPITAL_BASED_OUTPATIENT_CLINIC_OR_DEPARTMENT_OTHER): Payer: Medicare Other | Admitting: Nurse Practitioner

## 2021-02-23 ENCOUNTER — Other Ambulatory Visit: Payer: Self-pay | Admitting: *Deleted

## 2021-02-23 VITALS — BP 128/74 | HR 96 | Temp 98.1°F | Resp 18 | Wt 179.6 lb

## 2021-02-23 DIAGNOSIS — K869 Disease of pancreas, unspecified: Secondary | ICD-10-CM | POA: Diagnosis not present

## 2021-02-23 DIAGNOSIS — J449 Chronic obstructive pulmonary disease, unspecified: Secondary | ICD-10-CM | POA: Diagnosis not present

## 2021-02-23 DIAGNOSIS — C3431 Malignant neoplasm of lower lobe, right bronchus or lung: Secondary | ICD-10-CM | POA: Insufficient documentation

## 2021-02-23 DIAGNOSIS — C787 Secondary malignant neoplasm of liver and intrahepatic bile duct: Secondary | ICD-10-CM | POA: Insufficient documentation

## 2021-02-23 DIAGNOSIS — C3490 Malignant neoplasm of unspecified part of unspecified bronchus or lung: Secondary | ICD-10-CM

## 2021-02-23 DIAGNOSIS — I10 Essential (primary) hypertension: Secondary | ICD-10-CM | POA: Insufficient documentation

## 2021-02-23 DIAGNOSIS — R7989 Other specified abnormal findings of blood chemistry: Secondary | ICD-10-CM | POA: Diagnosis not present

## 2021-02-23 DIAGNOSIS — C7951 Secondary malignant neoplasm of bone: Secondary | ICD-10-CM | POA: Diagnosis not present

## 2021-02-23 LAB — CMP (CANCER CENTER ONLY)
ALT: 86 U/L — ABNORMAL HIGH (ref 0–44)
AST: 106 U/L — ABNORMAL HIGH (ref 15–41)
Albumin: 2.7 g/dL — ABNORMAL LOW (ref 3.5–5.0)
Alkaline Phosphatase: 684 U/L — ABNORMAL HIGH (ref 38–126)
Anion gap: 6 (ref 5–15)
BUN: 14 mg/dL (ref 8–23)
CO2: 31 mmol/L (ref 22–32)
Calcium: 8.5 mg/dL — ABNORMAL LOW (ref 8.9–10.3)
Chloride: 94 mmol/L — ABNORMAL LOW (ref 98–111)
Creatinine: 0.48 mg/dL — ABNORMAL LOW (ref 0.61–1.24)
GFR, Estimated: >60 mL/min (ref >60)
Glucose, Bld: 114 mg/dL — ABNORMAL HIGH (ref 70–99)
Potassium: 3.8 mmol/L (ref 3.5–5.1)
Sodium: 131 mmol/L — ABNORMAL LOW (ref 135–145)
Total Bilirubin: 2.5 mg/dL — ABNORMAL HIGH (ref 0.3–1.2)
Total Protein: 6 g/dL — ABNORMAL LOW (ref 6.5–8.1)

## 2021-02-23 LAB — CBC WITH DIFFERENTIAL (CANCER CENTER ONLY)
Abs Immature Granulocytes: 0.04 10*3/uL (ref 0.00–0.07)
Basophils Absolute: 0.1 10*3/uL (ref 0.0–0.1)
Basophils Relative: 1 %
Eosinophils Absolute: 0.1 10*3/uL (ref 0.0–0.5)
Eosinophils Relative: 1 %
HCT: 44.4 % (ref 39.0–52.0)
Hemoglobin: 15.2 g/dL (ref 13.0–17.0)
Immature Granulocytes: 0 %
Lymphocytes Relative: 8 %
Lymphs Abs: 0.9 10*3/uL (ref 0.7–4.0)
MCH: 33.3 pg (ref 26.0–34.0)
MCHC: 34.2 g/dL (ref 30.0–36.0)
MCV: 97.4 fL (ref 80.0–100.0)
Monocytes Absolute: 1.3 10*3/uL — ABNORMAL HIGH (ref 0.1–1.0)
Monocytes Relative: 12 %
Neutro Abs: 8.3 10*3/uL — ABNORMAL HIGH (ref 1.7–7.7)
Neutrophils Relative %: 78 %
Platelet Count: 234 10*3/uL (ref 150–400)
RBC: 4.56 MIL/uL (ref 4.22–5.81)
RDW: 18.7 % — ABNORMAL HIGH (ref 11.5–15.5)
WBC Count: 10.6 10*3/uL — ABNORMAL HIGH (ref 4.0–10.5)
nRBC: 0 % (ref 0.0–0.2)

## 2021-02-23 NOTE — Progress Notes (Signed)
Called referral line for [facility] w/referral. Patient is DNR and Dr. Benay Spice will be attending.

## 2021-02-23 NOTE — Progress Notes (Signed)
[place] OFFICE PROGRESS NOTE   Diagnosis: Metastatic carcinoma  INTERVAL HISTORY:   Mr. Kulpa returns as scheduled.  Docetaxel/ramucirumab held last week due to a further rise in LFTs.  Baseline dyspnea and cough.  Abdomen feels more distended.  No nausea/vomiting.  He continues to eat 3 meals a day but notes overall he is eating less.  Energy level is poor.  Objective:  Vital signs in last 24 hours:  Blood pressure 128/74, pulse 96, temperature 98.1 F (36.7 C), temperature source Temporal, resp. rate 18, weight 179 lb 9.6 oz (81.5 kg), SpO2 96 %.    Resp: Distant breath sounds. Cardio: Regular rate and rhythm. GI: Abdomen is distended.  Liver edge palpable. Vascular: Firm edema, chronic stasis change lower leg bilaterally.  Port-A-Cath without erythema.  Lab Results:  Lab Results  Component Value Date   WBC 10.6 (H) 02/23/2021   HGB 15.2 02/23/2021   HCT 44.4 02/23/2021   MCV 97.4 02/23/2021   PLT 234 02/23/2021   NEUTROABS 8.3 (H) 02/23/2021    Imaging:  No results found.  Medications: I have reviewed the patient's current medications.  Assessment/Plan: Pancreas mass, liver lesions Abdominal ultrasound 09/03/2018-3 liver lesions.  Intra-and extrahepatic biliary ductal dilatation. Presentation to the emergency department 09/08/2018 with painless jaundice.  Initial bilirubin returned at 18.3.   MRI abdomen 09/08/2018-1.9 x 2.6 cm hypoenhancing lesion arising exophytically along the posterior aspect of the pancreatic head suspicious for pancreatic adenocarcinoma.  There was suspected involvement of the distal common duct with secondary intrahepatic/extrahepatic ductal dilatation.  Lesion noted to abut the IVC.  Multiple liver metastases noted.  Small upper abdominal/retroperitoneal lymph nodes noted.   ERCP 09/09/2018-high-grade stricture and obstruction of the distal common bile duct.  A metallic biliary stent was placed.  Brushings in the mid and  lower third of the main bile duct were obtained with no malignant cells identified.   Bilirubin improved at 4.7 on 09/10/2018.   Biopsy of a liver lesion on 09/11/2018-metastatic carcinoma, cytokeratin 7 and TTF-1 positive, negative for cytokeratin 20, CDX 2, and PSA.  Foundation 1-microsatellite stable, tumor mutation burden 16, ERBB2 amplification CT chest 09/22/2018- right hilar/mediastinal/retrocrural lymphadenopathy, ill-defined right lower lobe nodularity, emphysema Cycle 1 carboplatin/Alimta/pembrolizumab 10/09/2018 Cycle 2 carboplatin/Alimta/pembrolizumab 10/30/2018 Cycle 3 carboplatin/Alimta/pembrolizumab 11/20/2018 Cycle 4 carboplatin/Alimta/pembrolizumab 12/11/2018 CT 12/26/2018- marked response to therapy of the radical abdominal adenopathy, decreased size of hepatic metastases, no new or progressive disease Cycle 5 Alimta/pembrolizumab 01/01/2019 Cycle 6 Alimta/pembrolizumab 01/23/2019 Cycle 7 Alimta/pembrolizumab 02/12/2019 Cycle 8  Alimta/pembrolizumab 03/05/2019 Cycle 9  Alimta/pembrolizumab 03/26/2019 CTs 04/14/2019-slight interval decrease in size and conspicuity of multiple, irregular low-attenuation liver lesions.  No new liver lesions.  No recurrent mediastinal or hilar lymphadenopathy.  No change in mildly prominent subcentimeter periaortic and peripheral lymph nodes without evidence of recurrent abdominal or pelvic lymphadenopathy. Cycle 10 Alimta/pembrolizumab 04/16/2019 Cycle 11 pembrolizumab 05/07/2019 (Alimta held secondary to increased tearing and leg edema) Cycle 12 Alimta/pembrolizumab 05/28/2019 Cycle 13 Alimta/pembrolizumab 06/18/2019 Restaging CTs 07/08/2019-worsening of low-density hepatic lesions.  Scattered small lymph nodes with increasing conspicuity, largest along the left periaortic chain approximately 9 mm previously approximately 5 mm. Cycle 14 Alimta/pembrolizumab 07/09/2019 Cycle 15 Alimta/pembrolizumab 07/31/2019 Cycle 16 Alimta/pembrolizumab 08/20/2019 Rising CEA  beginning December 2020 CTs 09/08/2019-enlargement of liver metastases, mild enlargement of abdominal/retroperitoneal and lower thoracic nodes, nonspecific pulmonary nodules-stable Cycle 1 gemcitabine 09/17/2019 Cycle 2 gemcitabine 10/01/2019 Cycle 3 gemcitabine 10/15/2019 Cycle 4 gemcitabine 10/29/2019 Cycle 5 gemcitabine 11/12/2019 Rising CEA 10/2019 CTs 11/19/19 mixed response in  liver, stable pulmonary nodules, thoracic and RP nodes, overall stable Plan to intensify chemo carboplatin AUC 4 plus gemcitabine given on day 1 q21 days starting ~12/03/19   Admission 11/30/2019 with recurrent biliary obstruction-ERCP 12/02/2019-obstructed biliary stent, new stent placed Cycle 1 gemcitabine/carboplatin 12/24/2019 Cycle 2 gemcitabine/carboplatin 01/14/2020 Cycle 3 gemcitabine/carboplatin 02/04/2020 Cycle 4 gemcitabine/carboplatin 02/25/2020 CTs 03/15/2020-decrease in size of hepatic metastatic lesions. No new liver lesions. New sclerotic lesions in the thoracic and lumbar spine. Stable small pulmonary nodules. Asymmetric wall thickening of the medial wall of the gallbladder new since previous exam without signs of inflammation. Cycle 5 gemcitabine/carboplatin 03/17/2020 Cycle 6 gemcitabine/carboplatin 04/15/2020 Cycle 7 gemcitabine/carboplatin 05/09/2020 Cycle 8 gemcitabine/carboplatin 05/31/2020 CTs 06/16/2020-unchanged sclerotic bone lesions, increase in size and number of liver metastases Cycle 1 docetaxel/ramucirumab 06/27/2020 Cycle 2 docetaxel/ramucirumab 07/18/2020 Cycle 3 docetaxel/ramucirumab 08/08/2020 Cycle 4 docetaxel/ramucirumab 08/29/2020 CT 09/16/2020-decreased size of liver lesions, stable bone metastases Cycle 5 docetaxel/ramucirumab 09/19/2020 Cycle 6 docetaxel/ramucirumab 10/10/2020 Cycle 7 docetaxel/ramucirumab 11/01/2020 Cycle 8 docetaxel/ramucirumab 11/22/2020 CT abdomen/pelvis 12/09/2020-hepatic and osseous metastatic disease unchanged. Cycle 9 docetaxel/ramucirumab 12/13/2020 Cycle 10  docetaxel/ramucirumab 01/04/2021 Cycle 11 ramucirumab 01/25/2021-docetaxel held due to elevated LFTs CTs 02/17/2021-measurable interval increase in size of multiple liver metastases.  New intraperitoneal likely related to liver dysfunction.  No intrahepatic biliary duct dilatation.  Common bile duct stent in position.  No pancreatic duct dilatation or inflammation.  Stable sclerotic skeletal metastasis.  Increasing small left effusion. COPD Hypertension Trace edema right leg 12/11/2018. Doppler negative for DVT 12/12/2018. Watery eyes, runny nose reported 12/11/2018.  Question related to Alimta.  Persistent watery eyes 04/16/2019.  Ophthalmology evaluation 05/11/2019-blepharitis,ectropion left lower lid, macular degeneration Admission 12/24/2018 with a fever- no source for infection identified, completed outpatient course of ciprofloxacin Port-A-Cath placement 09/14/2019, interventional radiology Colonoscopy 06/16/2015-diverticulosis in the sigmoid colon, examination otherwise normal    Disposition: Mr. Lindaman has most recently been treated with docetaxel/ramucirumab.  Recent restaging CTs show evidence of progression in the liver.  Dr. Benay Spice reviewed the CT report/images with Mr. Antrim and his wife at today's visit and recommends discontinuation of the current treatment regimen.  In addition Dr. Benay Spice recommends supportive/comfort care with a hospice referral.  Mr. Prewitt and his wife are in agreement.  CODE STATUS discussed.  He will be placed on NO CODE BLUE status.  He will return for follow-up in 3 to 4 weeks.  He understands to contact the office in the interim with any problems.  Patient seen with Dr. Benay Spice.    Ned Card ANP/GNP-BC   02/23/2021  10:08 AM This was a shared visit with Ned Card.  Mr. Mirsky was interviewed and examined.  We reviewed the CT findings and images with him.  He has clinical and radiologic evidence of disease progression.  He has been treated with several  lines of systemic chemotherapy.  The chance of a clinical response with further chemotherapy is small.  We recommend hospice care.  Mr. Vanderschaaf is in agreement.  We will make referral for home hospice care.  He agrees to no CODE BLUE status.  I was present for greater than 50% of today's visit.  I performed medical decision making.  Julieanne Manson, MD

## 2021-03-15 ENCOUNTER — Other Ambulatory Visit: Payer: Self-pay | Admitting: Oncology

## 2021-03-16 ENCOUNTER — Other Ambulatory Visit: Payer: Self-pay

## 2021-03-23 ENCOUNTER — Inpatient Hospital Stay: Payer: Medicare Other | Admitting: Oncology

## 2021-03-27 ENCOUNTER — Telehealth: Payer: Self-pay

## 2021-03-27 NOTE — Telephone Encounter (Signed)
Correspondence form [place] [facility] passed away on 2021/04/15 @ 2200-09-22

## 2021-04-25 DEATH — deceased

## 2021-09-22 IMAGING — CT CT CHEST W/ CM
2 of 5 series · 12 of 36 positions shown, 15 images · IV contrast (OMNIPAQUE)
Comparison: 03/15/2020, 11/19/2019

CLINICAL DATA: Restaging metastatic non-small cell lung cancer,
ongoing chemotherapy and immunotherapy

EXAM:
CT CHEST, ABDOMEN, AND PELVIS WITH CONTRAST
TECHNIQUE: Multidetector CT imaging of the chest, abdomen and pelvis was
performed following the standard protocol during bolus
administration of intravenous contrast.
CONTRAST:  100mL OMNIPAQUE IOHEXOL 300 MG/ML SOLN, additional oral
enteric contrast

[Series 2: cap with · axial · 0.81mm/px · z∈[-528,-13]mm · 9 of 129 slices shown, 12 images]
[im 13/129  mediastinal]
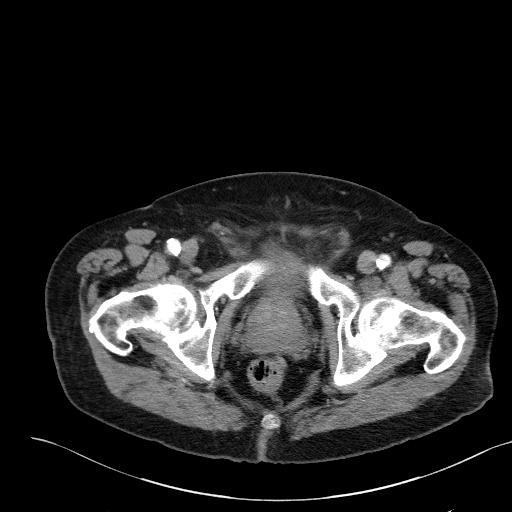
[im 13/129  lung]
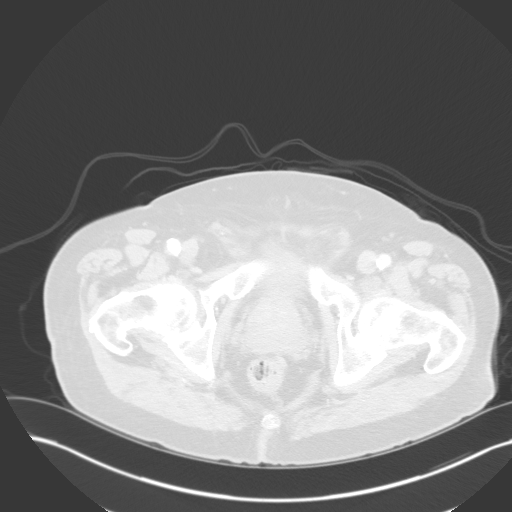
[im 26/129  lung]
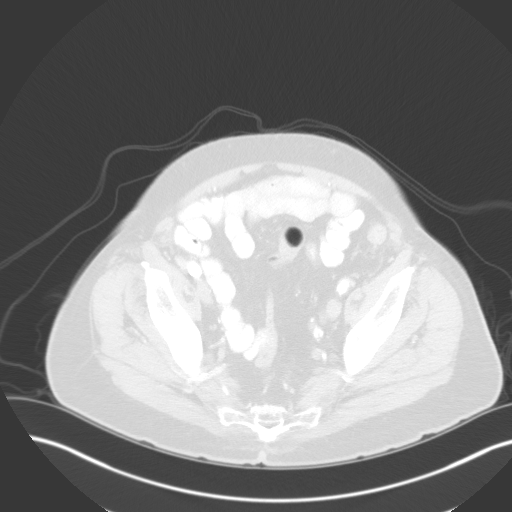
[im 39/129  lung]
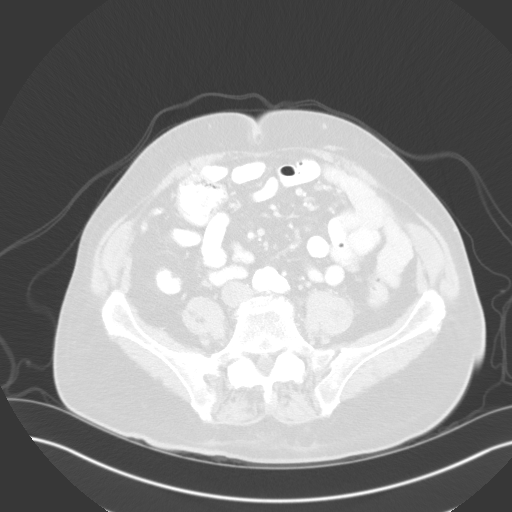
[im 52/129  lung]
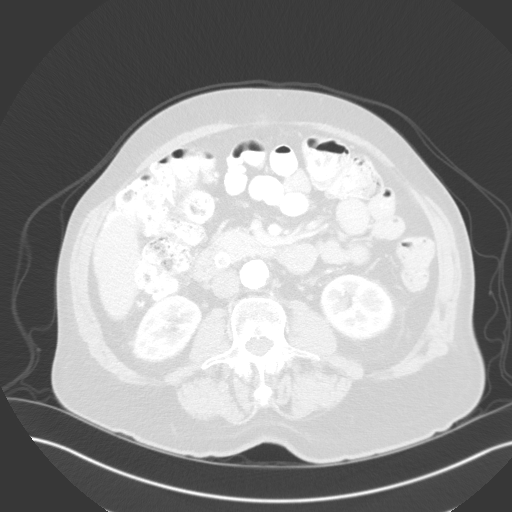
[im 65/129  mediastinal]
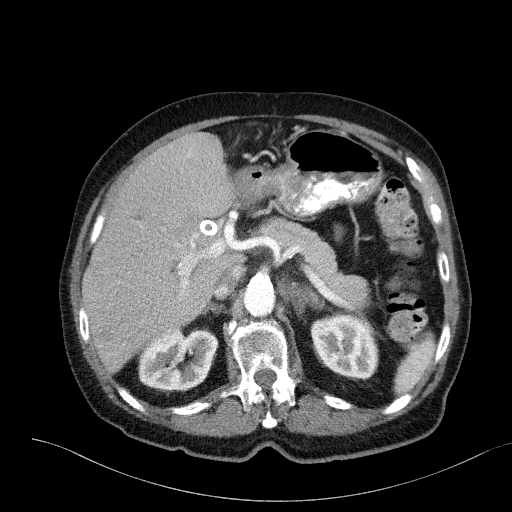
[im 65/129  lung]
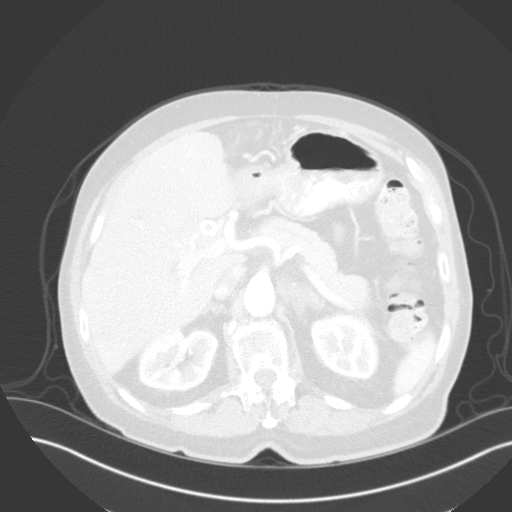
[im 77/129  lung]
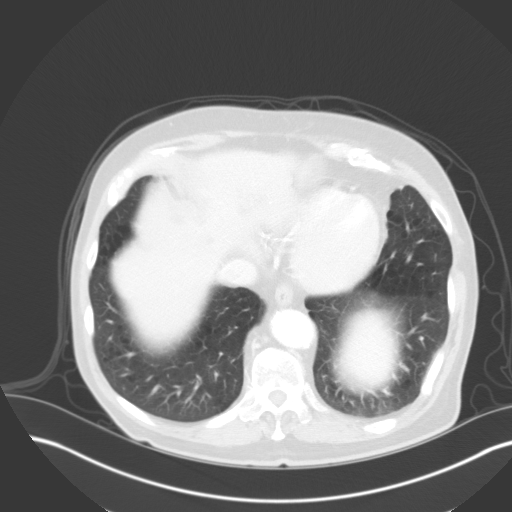
[im 90/129  lung]
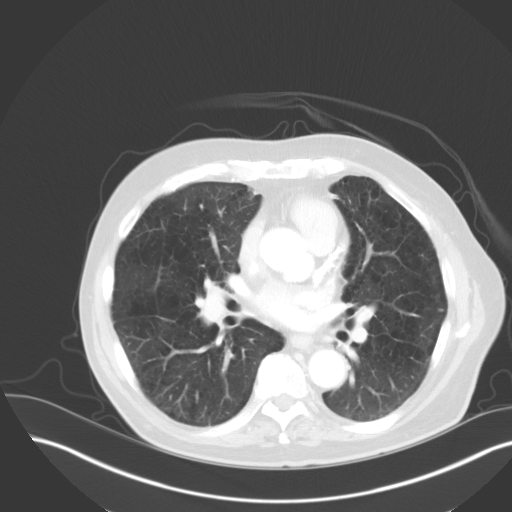
[im 103/129  lung]
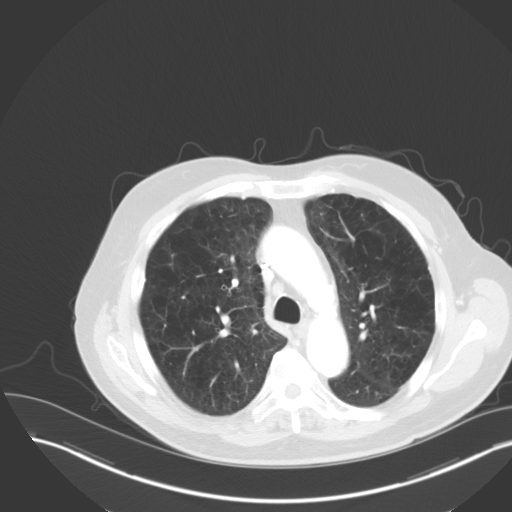
[im 116/129  mediastinal]
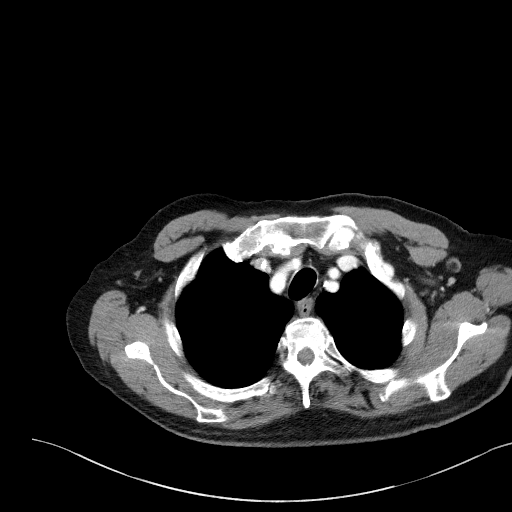
[im 116/129  lung]
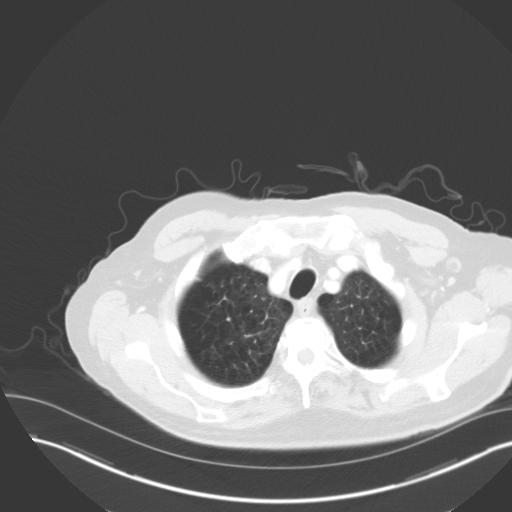

[Series 4: coronals · coronal · 1.01mm/px · 3 of 145 slices shown]
[im 29/145  lung]
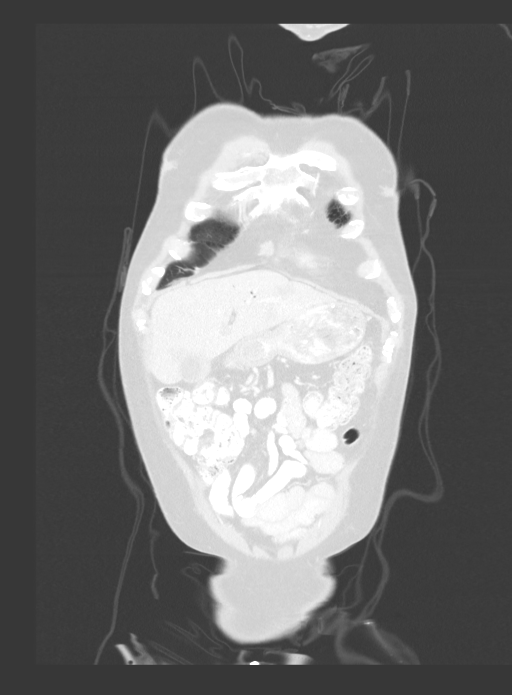
[im 58/145  lung]
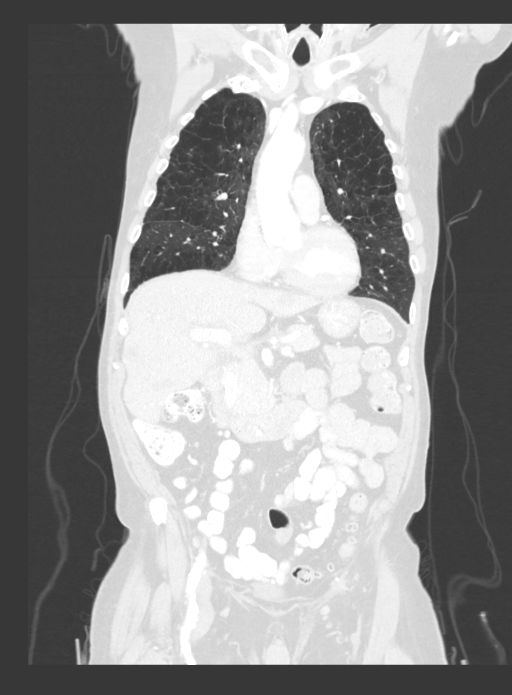
[im 87/145  lung]
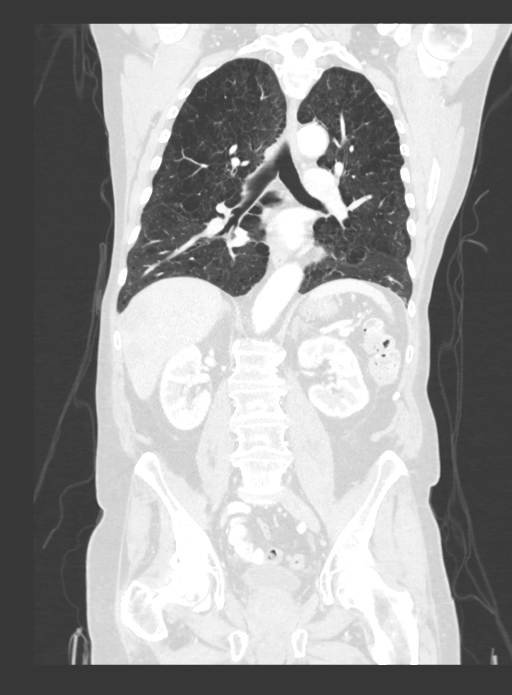

[12 of 36 positions shown; findings below may reference images not displayed]

FINDINGS: CT CHEST FINDINGS

Cardiovascular: Right chest port catheter. Aortic atherosclerosis.
Normal heart size. Three-vessel coronary artery calcifications. No
pericardial effusion.

Mediastinum/Nodes: No enlarged mediastinal, hilar, or axillary lymph
nodes. Thyroid gland, trachea, and esophagus demonstrate no
significant findings.

Lungs/Pleura: Severe centrilobular emphysema. Stable fissural nodule
of the right middle lobe measuring 6 mm (series 6, image 108). No
pleural effusion or pneumothorax.

Musculoskeletal: No chest wall mass. Unchanged appearance of
multiple sclerotic osseous lesions, for example in the T11 vertebral
body (series 5, image 82).

CT ABDOMEN PELVIS FINDINGS

Hepatobiliary: Interval increase in size of numerous hypodense liver
lesions, an index lesion in the central right lobe, hepatic segment
VIII, measuring 4.4 x 3.7 cm, previously 3.1 x 2.8 cm (series 2,
image 55), an additional index lesion in the inferior right lobe of
the liver, hepatic segment VI measuring 2.8 x 2.3 cm, previously
x 1.5 cm (series 2, image 74). Unchanged, somewhat eccentric
thickening of the medial gallbladder wall (series 2, image 71).
Common bile duct stent remains in position. No biliary ductal
dilatation. Post stenting pneumobilia.

Pancreas: Unremarkable. No pancreatic ductal dilatation or
surrounding inflammatory changes.

Spleen: Normal in size without significant abnormality.

Adrenals/Urinary Tract: Adrenal glands are unremarkable. Kidneys are
normal, without renal calculi, solid lesion, or hydronephrosis.
Bladder is unremarkable.

Stomach/Bowel: Stomach is within normal limits. Appendix appears
normal. No evidence of bowel wall thickening, distention, or
inflammatory changes. Sigmoid diverticulosis.

Vascular/Lymphatic: Aortic atherosclerosis. Redemonstrated ectasias
of the infrarenal abdominal aorta, measuring up to 2.9 x 2.9 cm.
Unchanged prominent subcentimeter celiac axis and crural lymph nodes
(series 2, image 64).

Reproductive: Prostatomegaly.

Other: No abdominal wall hernia or abnormality. No abdominopelvic
ascites.

Musculoskeletal: No acute osseous findings.
IMPRESSION: 1. Interval increase in size of numerous hypodense liver lesions,
findings consistent with worsened hepatic metastatic disease.
2. Common bile duct stent remains in position. No biliary ductal
dilatation.
3. Unchanged prominent subcentimeter celiac axis and crural lymph
nodes.
4. Unchanged appearance of multiple sclerotic osseous lesions. No
new osseous metastatic lesions identified.
5. No evidence of recurrent or metastatic disease in the chest.
6. Severe emphysema.
7. Coronary artery disease.
8. Redemonstrated ectasia of the infrarenal abdominal aorta
measuring up to 2.9 cm. Recommend follow-up ultrasound every 5 years
if not otherwise followed. This recommendation follows ACR consensus
guidelines: White Paper of the ACR Incidental Findings Committee II

Aortic Atherosclerosis (WOULY-BXW.W) and Emphysema (WOULY-7Q1.S).

## 2022-01-06 IMAGING — DX DG SHOULDER 2+V*R*
3 series · 3 of 3 positions shown · non-contrast
Comparison: Prior chest radiographs

CLINICAL DATA: Chronic RIGHT shoulder pain for 6 months. No known
injury.

EXAM:
RIGHT SHOULDER - 2+ VIEW

[shoulder grashey]
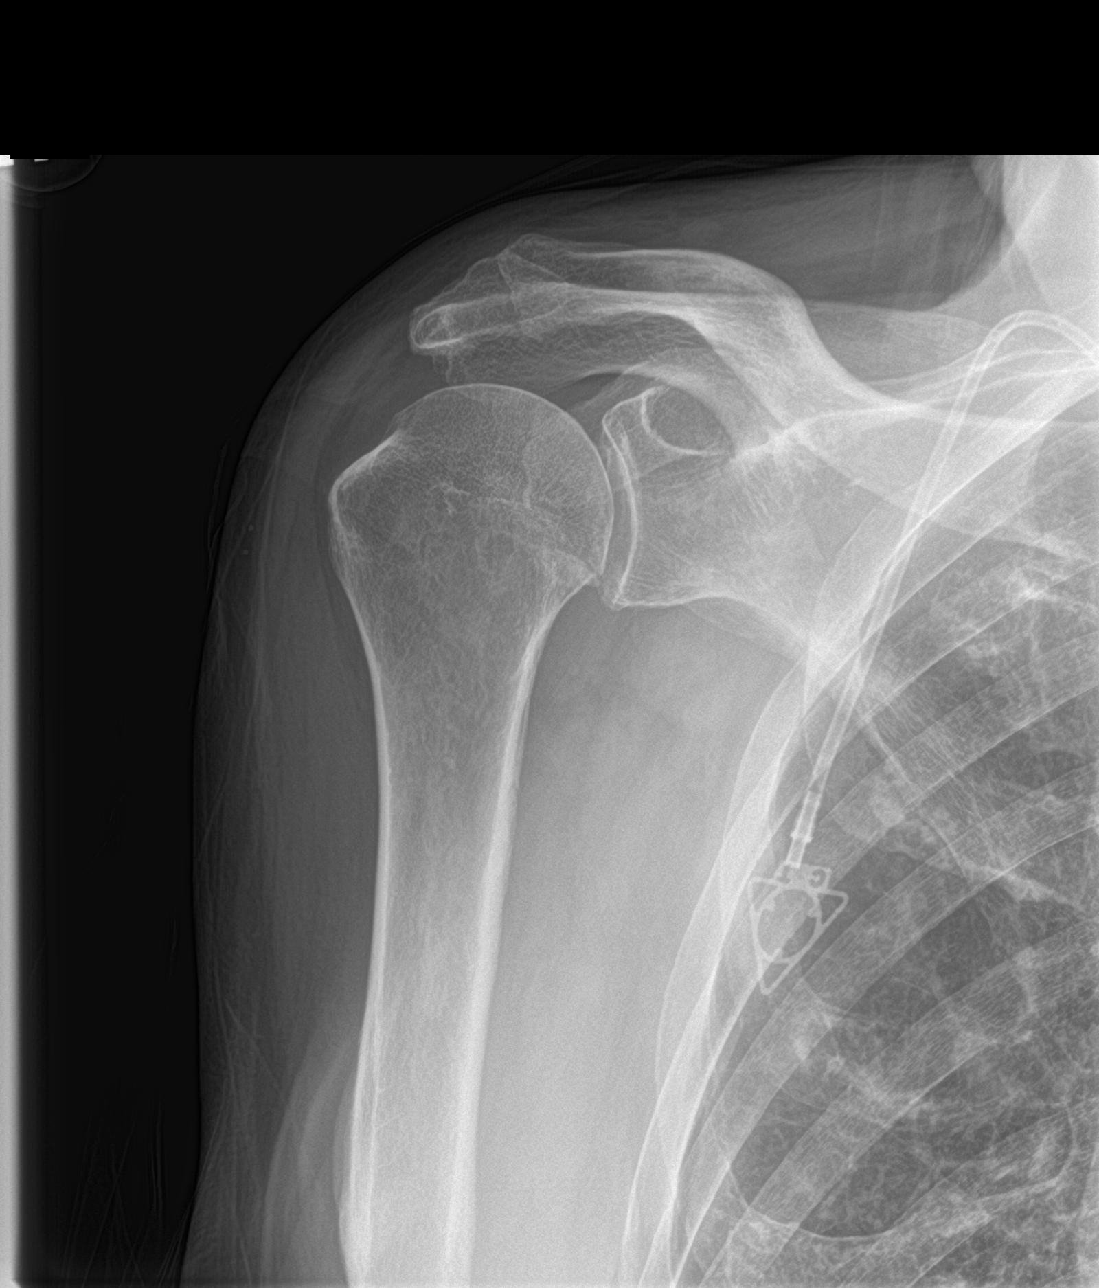

[shoulder y view]
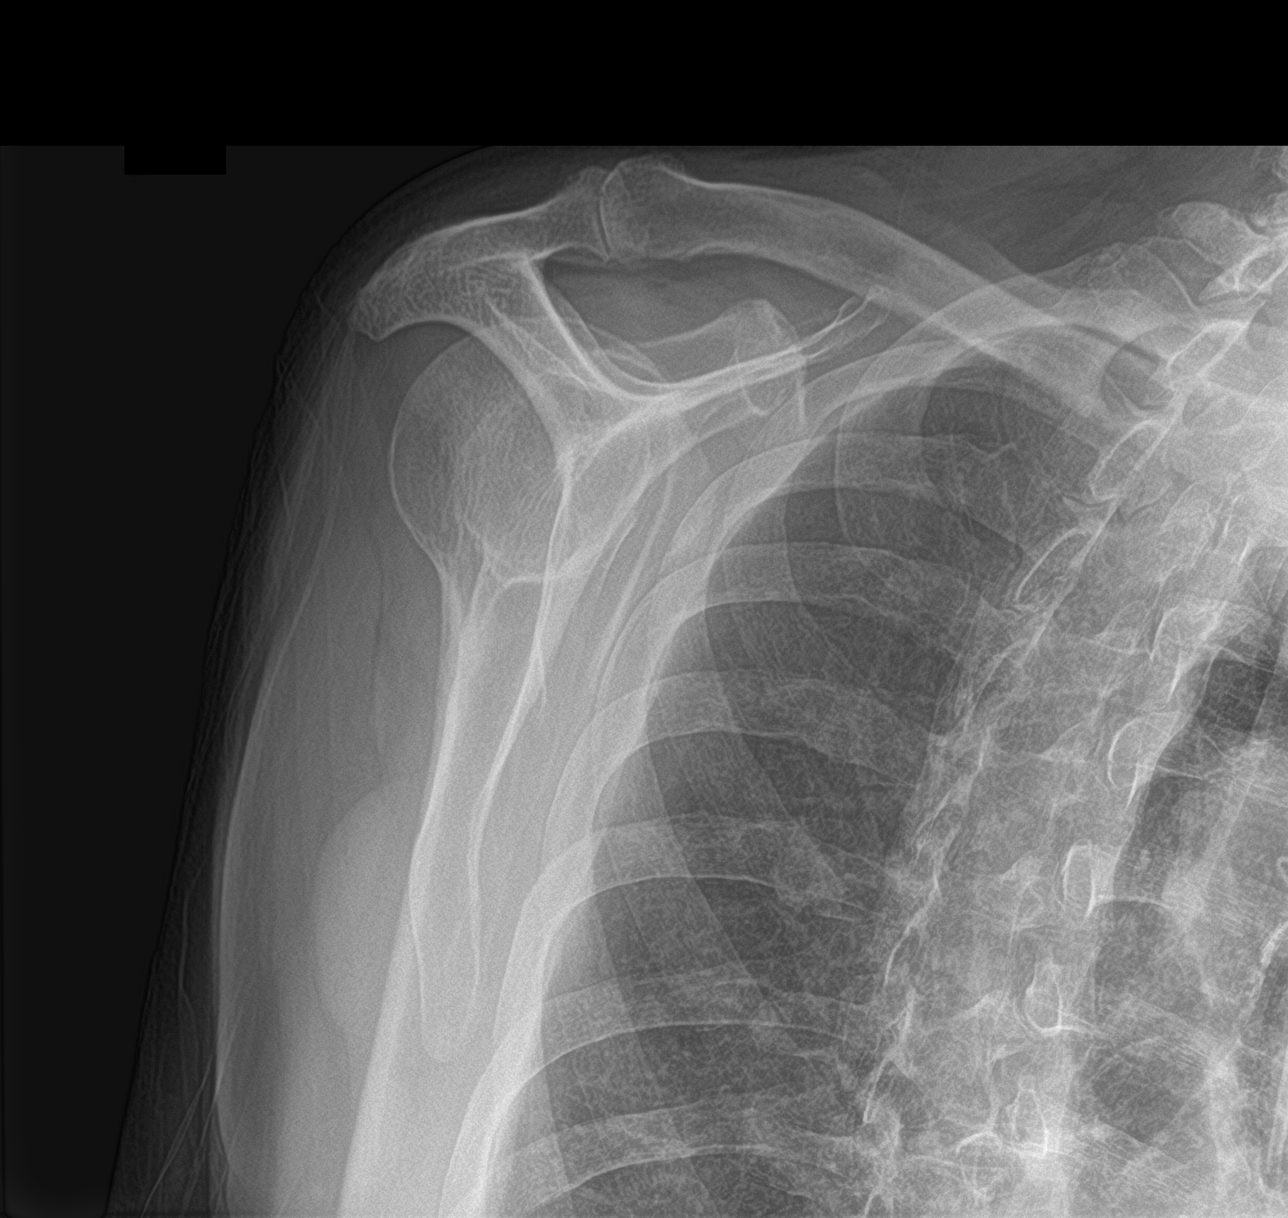

[shoulder axillary]
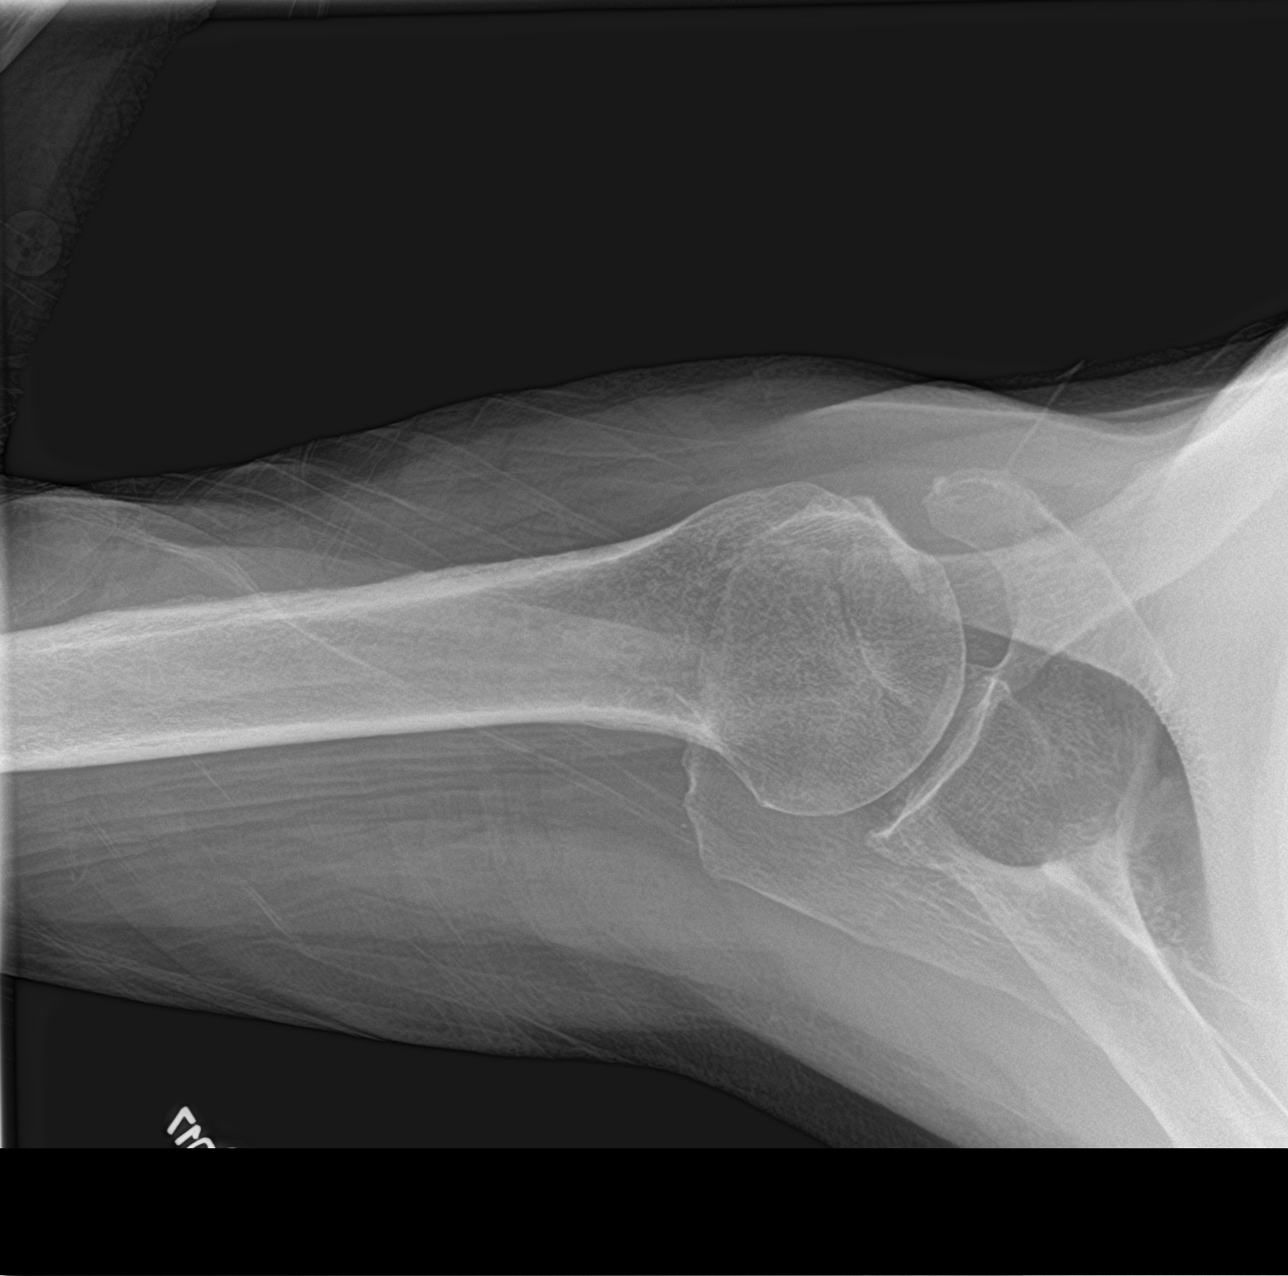

[3 of 3 positions shown; findings below may reference images not displayed]

FINDINGS: There is no evidence of fracture or dislocation. There is no
evidence of arthropathy or other focal bone abnormality. Soft
tissues are unremarkable.
IMPRESSION: Negative.

## 2023-05-16 NOTE — Telephone Encounter (Signed)
Telephone call
